# Patient Record
Sex: Male | Born: 1940 | Race: White | Hispanic: No | Marital: Married | Smoking: Former smoker
Health system: Southern US, Community
[De-identification: ages and names within clinical notes are randomized; demographics above are authoritative.]

## PROBLEM LIST (undated history)

## (undated) DIAGNOSIS — E785 Hyperlipidemia, unspecified: Secondary | ICD-10-CM

## (undated) DIAGNOSIS — C801 Malignant (primary) neoplasm, unspecified: Secondary | ICD-10-CM

## (undated) DIAGNOSIS — G4730 Sleep apnea, unspecified: Secondary | ICD-10-CM

## (undated) DIAGNOSIS — F329 Major depressive disorder, single episode, unspecified: Secondary | ICD-10-CM

## (undated) DIAGNOSIS — I10 Essential (primary) hypertension: Secondary | ICD-10-CM

## (undated) DIAGNOSIS — F419 Anxiety disorder, unspecified: Secondary | ICD-10-CM

## (undated) DIAGNOSIS — G629 Polyneuropathy, unspecified: Secondary | ICD-10-CM

## (undated) DIAGNOSIS — I4891 Unspecified atrial fibrillation: Secondary | ICD-10-CM

## (undated) DIAGNOSIS — I480 Paroxysmal atrial fibrillation: Secondary | ICD-10-CM

## (undated) DIAGNOSIS — K219 Gastro-esophageal reflux disease without esophagitis: Secondary | ICD-10-CM

## (undated) DIAGNOSIS — I499 Cardiac arrhythmia, unspecified: Secondary | ICD-10-CM

## (undated) DIAGNOSIS — E119 Type 2 diabetes mellitus without complications: Secondary | ICD-10-CM

## (undated) DIAGNOSIS — I639 Cerebral infarction, unspecified: Secondary | ICD-10-CM

## (undated) DIAGNOSIS — M1990 Unspecified osteoarthritis, unspecified site: Secondary | ICD-10-CM

## (undated) DIAGNOSIS — F32A Depression, unspecified: Secondary | ICD-10-CM

## (undated) DIAGNOSIS — I739 Peripheral vascular disease, unspecified: Secondary | ICD-10-CM

## (undated) DIAGNOSIS — E669 Obesity, unspecified: Secondary | ICD-10-CM

## (undated) HISTORY — DX: Major depressive disorder, single episode, unspecified: F32.9

## (undated) HISTORY — DX: Anxiety disorder, unspecified: F41.9

## (undated) HISTORY — DX: Malignant (primary) neoplasm, unspecified: C80.1

## (undated) HISTORY — DX: Depression, unspecified: F32.A

## (undated) HISTORY — DX: Essential (primary) hypertension: I10

## (undated) HISTORY — DX: Gastro-esophageal reflux disease without esophagitis: K21.9

## (undated) HISTORY — DX: Polyneuropathy, unspecified: G62.9

## (undated) HISTORY — DX: Paroxysmal atrial fibrillation: I48.0

## (undated) HISTORY — DX: Cerebral infarction, unspecified: I63.9

## (undated) HISTORY — DX: Hyperlipidemia, unspecified: E78.5

## (undated) HISTORY — DX: Unspecified atrial fibrillation: I48.91

## (undated) HISTORY — DX: Obesity, unspecified: E66.9

## (undated) HISTORY — DX: Type 2 diabetes mellitus without complications: E11.9

## (undated) HISTORY — PX: BACK SURGERY: SHX140

## (undated) HISTORY — PX: KNEE ARTHROSCOPY: SUR90

## (undated) HISTORY — PX: COLONOSCOPY: SHX174

## (undated) HISTORY — PX: BASAL CELL CARCINOMA EXCISION: SHX1214

---
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
## 1991-07-18 DIAGNOSIS — C4491 Basal cell carcinoma of skin, unspecified: Secondary | ICD-10-CM

## 1991-07-18 HISTORY — DX: Basal cell carcinoma of skin, unspecified: C44.91

## 1999-02-11 ENCOUNTER — Ambulatory Visit (HOSPITAL_COMMUNITY): Admission: RE | Admit: 1999-02-11 | Discharge: 1999-02-11 | Payer: Self-pay | Admitting: Internal Medicine

## 1999-02-11 ENCOUNTER — Encounter: Payer: Self-pay | Admitting: Internal Medicine

## 1999-03-01 ENCOUNTER — Encounter: Payer: Self-pay | Admitting: Neurosurgery

## 1999-03-01 ENCOUNTER — Ambulatory Visit (HOSPITAL_COMMUNITY): Admission: RE | Admit: 1999-03-01 | Discharge: 1999-03-02 | Payer: Self-pay | Admitting: Neurosurgery

## 1999-12-13 ENCOUNTER — Ambulatory Visit (HOSPITAL_COMMUNITY): Admission: RE | Admit: 1999-12-13 | Discharge: 1999-12-13 | Payer: Self-pay | Admitting: Internal Medicine

## 1999-12-13 ENCOUNTER — Encounter: Payer: Self-pay | Admitting: Internal Medicine

## 1999-12-22 ENCOUNTER — Encounter: Admission: RE | Admit: 1999-12-22 | Discharge: 2000-03-21 | Payer: Self-pay | Admitting: Anesthesiology

## 2004-03-08 ENCOUNTER — Encounter: Payer: Self-pay | Admitting: Internal Medicine

## 2008-08-20 DIAGNOSIS — C4492 Squamous cell carcinoma of skin, unspecified: Secondary | ICD-10-CM

## 2008-08-20 HISTORY — DX: Squamous cell carcinoma of skin, unspecified: C44.92

## 2009-02-06 ENCOUNTER — Encounter (INDEPENDENT_AMBULATORY_CARE_PROVIDER_SITE_OTHER): Payer: Self-pay | Admitting: *Deleted

## 2009-05-22 ENCOUNTER — Ambulatory Visit: Payer: Self-pay | Admitting: Internal Medicine

## 2009-05-22 DIAGNOSIS — Z794 Long term (current) use of insulin: Secondary | ICD-10-CM

## 2009-05-22 DIAGNOSIS — K219 Gastro-esophageal reflux disease without esophagitis: Secondary | ICD-10-CM | POA: Insufficient documentation

## 2009-05-22 DIAGNOSIS — E119 Type 2 diabetes mellitus without complications: Secondary | ICD-10-CM | POA: Insufficient documentation

## 2009-06-24 ENCOUNTER — Telehealth: Payer: Self-pay | Admitting: Internal Medicine

## 2009-06-24 ENCOUNTER — Encounter: Payer: Self-pay | Admitting: Internal Medicine

## 2009-06-24 ENCOUNTER — Ambulatory Visit: Payer: Self-pay | Admitting: Internal Medicine

## 2009-06-24 DIAGNOSIS — K2980 Duodenitis without bleeding: Secondary | ICD-10-CM | POA: Insufficient documentation

## 2009-06-24 LAB — CONVERTED CEMR LAB: UREASE: NEGATIVE

## 2009-06-26 ENCOUNTER — Encounter: Payer: Self-pay | Admitting: Internal Medicine

## 2009-08-12 ENCOUNTER — Telehealth (INDEPENDENT_AMBULATORY_CARE_PROVIDER_SITE_OTHER): Payer: Self-pay | Admitting: *Deleted

## 2009-08-13 ENCOUNTER — Encounter (INDEPENDENT_AMBULATORY_CARE_PROVIDER_SITE_OTHER): Payer: Self-pay

## 2009-08-14 ENCOUNTER — Ambulatory Visit: Payer: Self-pay | Admitting: Internal Medicine

## 2009-08-14 ENCOUNTER — Encounter (INDEPENDENT_AMBULATORY_CARE_PROVIDER_SITE_OTHER): Payer: Self-pay

## 2009-08-31 ENCOUNTER — Telehealth: Payer: Self-pay | Admitting: Internal Medicine

## 2009-08-31 ENCOUNTER — Ambulatory Visit: Payer: Self-pay | Admitting: Internal Medicine

## 2009-09-03 ENCOUNTER — Encounter: Payer: Self-pay | Admitting: Internal Medicine

## 2009-09-30 DIAGNOSIS — F320 Major depressive disorder, single episode, mild: Secondary | ICD-10-CM | POA: Insufficient documentation

## 2010-07-23 ENCOUNTER — Encounter: Admission: RE | Admit: 2010-07-23 | Discharge: 2010-07-23 | Payer: Self-pay | Admitting: Endocrinology

## 2010-10-14 NOTE — Procedures (Signed)
Summary: colonoscopy   Colonoscopy  Procedure date:  03/08/2004  Findings:      Results: Diverticulosis.       Location:  [facility].    Comments:      Repeat colonoscopy in 5 years.   Procedures Next Due Date:    Colonoscopy: 03/2009 Patient Name: Charles, Hall MRN:  Procedure Procedures: Colonoscopy CPT: 16109.  Personnel: Endoscopist: Wilhemina Bonito. Marina Goodell, MD.  Referred By: Adrian Prince, MD.  Exam Location: Exam performed in Outpatient Clinic. Outpatient  Patient Consent: Procedure, Alternatives, Risks and Benefits discussed, consent obtained, from patient. Consent was obtained by the RN.  Indications  Average Risk Screening Routine.  History  Current Medications: Patient is not currently taking Coumadin.  Pre-Exam Physical: Performed Mar 08, 2004. Entire physical exam was normal.  Exam Exam: Extent of exam reached: Cecum, extent intended: Cecum.  The cecum was identified by appendiceal orifice and IC valve. Patient position: on left side. Colon retroflexion performed. Images taken. ASA Classification: II. Tolerance: excellent.  Monitoring: Pulse and BP monitoring, Oximetry used. Supplemental O2 given.  Colon Prep Used Miralax for colon prep. Prep results: excellent.  Sedation Meds: Patient assessed and found to be appropriate for moderate (conscious) sedation. Fentanyl 100 mcg. given IV. Versed 10 mg. given IV.  Findings NORMAL EXAM: Cecum to Rectum.  - DIVERTICULOSIS: Sigmoid Colon. ICD9: Diverticulosis, Colon: 562.10.    Comments: NO POLYPS SEEN Assessment Abnormal examination, see findings above.  Diagnoses: 562.10: Diverticulosis, Colon.   Events  Unplanned Interventions: No intervention was required.  Unplanned Events: There were no complications. Plans Disposition: After procedure patient sent to recovery. After recovery patient sent home.  Scheduling/Referral: Colonoscopy, to Wilhemina Bonito. Marina Goodell, MD, IN ABOUT 5 YEARS FOR  REPEAT SCREENING,    This report was created from the original endoscopy report, which was reviewed and signed by the above listed endoscopist.   cc:  Adrian Prince, MD      The patient

## 2010-10-14 NOTE — Miscellaneous (Signed)
Summary: Lec previsit  Clinical Lists Changes  Observations: Added new observation of ALLERGY REV: Done (08/14/2009 15:29)

## 2010-12-13 LAB — GLUCOSE, CAPILLARY: Glucose-Capillary: 132 mg/dL — ABNORMAL HIGH (ref 70–99)

## 2010-12-16 LAB — GLUCOSE, CAPILLARY
Glucose-Capillary: 152 mg/dL — ABNORMAL HIGH (ref 70–99)
Glucose-Capillary: 168 mg/dL — ABNORMAL HIGH (ref 70–99)

## 2011-01-28 NOTE — Op Note (Signed)
[facility]  Patient:    [name], [name]                       MRN: [id number] Proc. Date: 01/24/00 Adm. Date:  [id number] Attending:  Thyra Breed CC:         Winn Jock. Earl Gala, M.D.             Stefani Dama, M.D.                           Operative Report  PROCEDURE:  Scheduled third epidural steroid injection.  HISTORY:  The patient got about ten days worth of benefits from his last injection and now complains of left hip and left foot pain.  He does not feel as though he is getting any sustained improvements, and at his second injection, noted no overall improvement.  I advised him that his MRI does show rather significant degenerative disk disease and herniation to the left L4-5 which I suspect is the source of his discomfort.  PHYSICAL EXAMINATION:  VITAL SIGNS:  Blood pressure is 133/77; heart rate 74; respiratory rate 14; O2 saturation 98%; pain level is 7 out of 10; temperature is 97.3.  The patient demonstrates negative straight-leg raise signs.  His deep tendon reflexes were hypoactive and symmetric in the lower extremity.  IMPRESSION: 1. Low back pain, on the basis of herniated disk at L4-5 to the left, with    underlying degenerative disk disease and ______ arthritis, not    responsive to two epidural steroid injections. 2. Other medical problems per Dr. Earl Gala.  DISPOSITION: 1. The patient is scheduled to see Dr. Danielle Dess in two weeks, and I advised him    I would hold off any injections today. DD:  01/24/00 TD:  01/25/00 Job: [id number]

## 2011-04-12 ENCOUNTER — Emergency Department (HOSPITAL_COMMUNITY): Payer: Medicare Other

## 2011-04-12 ENCOUNTER — Emergency Department (HOSPITAL_COMMUNITY)
Admission: EM | Admit: 2011-04-12 | Discharge: 2011-04-12 | Disposition: A | Discharge status: Home / Self Care | Payer: Medicare Other | Attending: Emergency Medicine | Admitting: Emergency Medicine

## 2011-04-12 DIAGNOSIS — Z79899 Other long term (current) drug therapy: Secondary | ICD-10-CM | POA: Insufficient documentation

## 2011-04-12 DIAGNOSIS — Z9889 Other specified postprocedural states: Secondary | ICD-10-CM | POA: Insufficient documentation

## 2011-04-12 DIAGNOSIS — Z794 Long term (current) use of insulin: Secondary | ICD-10-CM | POA: Insufficient documentation

## 2011-04-12 DIAGNOSIS — E119 Type 2 diabetes mellitus without complications: Secondary | ICD-10-CM | POA: Insufficient documentation

## 2011-04-12 DIAGNOSIS — I10 Essential (primary) hypertension: Secondary | ICD-10-CM | POA: Insufficient documentation

## 2011-04-12 DIAGNOSIS — M79609 Pain in unspecified limb: Secondary | ICD-10-CM | POA: Insufficient documentation

## 2011-04-12 DIAGNOSIS — E7800 Pure hypercholesterolemia, unspecified: Secondary | ICD-10-CM | POA: Insufficient documentation

## 2011-04-12 DIAGNOSIS — M549 Dorsalgia, unspecified: Secondary | ICD-10-CM | POA: Insufficient documentation

## 2011-04-12 DIAGNOSIS — M5430 Sciatica, unspecified side: Secondary | ICD-10-CM | POA: Insufficient documentation

## 2011-05-25 ENCOUNTER — Encounter (HOSPITAL_COMMUNITY)
Admission: RE | Admit: 2011-05-25 | Discharge: 2011-05-25 | Disposition: A | Discharge status: Home / Self Care | Payer: Medicare Other | Source: Ambulatory Visit | Attending: Neurological Surgery | Admitting: Neurological Surgery

## 2011-05-25 ENCOUNTER — Other Ambulatory Visit (HOSPITAL_COMMUNITY): Payer: Self-pay | Admitting: Neurological Surgery

## 2011-05-25 DIAGNOSIS — M5416 Radiculopathy, lumbar region: Secondary | ICD-10-CM

## 2011-05-25 LAB — BASIC METABOLIC PANEL
BUN: 18 mg/dL (ref 6–23)
Chloride: 102 mEq/L (ref 96–112)
Glucose, Bld: 370 mg/dL — ABNORMAL HIGH (ref 70–99)
Potassium: 5.4 mEq/L — ABNORMAL HIGH (ref 3.5–5.1)

## 2011-05-25 LAB — CBC
HCT: 40.5 % (ref 39.0–52.0)
Hemoglobin: 13.9 g/dL (ref 13.0–17.0)
MCHC: 34.3 g/dL (ref 30.0–36.0)
WBC: 9.2 10*3/uL (ref 4.0–10.5)

## 2011-05-30 ENCOUNTER — Ambulatory Visit (HOSPITAL_COMMUNITY)
Admission: RE | Admit: 2011-05-30 | Discharge: 2011-05-30 | Disposition: A | Discharge status: Home / Self Care | Payer: Medicare Other | Source: Ambulatory Visit | Attending: Neurological Surgery | Admitting: Neurological Surgery

## 2011-05-30 ENCOUNTER — Ambulatory Visit (HOSPITAL_COMMUNITY): Payer: Medicare Other

## 2011-05-30 DIAGNOSIS — M5126 Other intervertebral disc displacement, lumbar region: Secondary | ICD-10-CM | POA: Insufficient documentation

## 2011-05-30 DIAGNOSIS — Z01818 Encounter for other preprocedural examination: Secondary | ICD-10-CM | POA: Insufficient documentation

## 2011-05-30 DIAGNOSIS — Z01812 Encounter for preprocedural laboratory examination: Secondary | ICD-10-CM | POA: Insufficient documentation

## 2011-05-30 DIAGNOSIS — I10 Essential (primary) hypertension: Secondary | ICD-10-CM | POA: Insufficient documentation

## 2011-05-30 DIAGNOSIS — K219 Gastro-esophageal reflux disease without esophagitis: Secondary | ICD-10-CM | POA: Insufficient documentation

## 2011-05-30 DIAGNOSIS — E119 Type 2 diabetes mellitus without complications: Secondary | ICD-10-CM | POA: Insufficient documentation

## 2011-05-30 DIAGNOSIS — Z01810 Encounter for preprocedural cardiovascular examination: Secondary | ICD-10-CM | POA: Insufficient documentation

## 2011-05-30 DIAGNOSIS — M47817 Spondylosis without myelopathy or radiculopathy, lumbosacral region: Secondary | ICD-10-CM | POA: Insufficient documentation

## 2011-05-30 LAB — GLUCOSE, CAPILLARY
Glucose-Capillary: 207 mg/dL — ABNORMAL HIGH (ref 70–99)
Glucose-Capillary: 215 mg/dL — ABNORMAL HIGH (ref 70–99)
Glucose-Capillary: 277 mg/dL — ABNORMAL HIGH (ref 70–99)

## 2011-07-13 NOTE — Op Note (Signed)
[name], [name]                ACCOUNT NO.:  [id number]  MEDICAL RECORD NO.:  [id number]  LOCATION:  3537                         FACILITY:  [facility]  PHYSICIAN:  Stefani Dama, M.D.  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  OPERATION:  Microdiskectomy at L4-L5 right with operating microscope in microdissection technique.  SURGEON:  Stefani Dama, MD  FIRST ASSISTANT:  Cristi Loron, MD  ANESTHESIA:  General endotracheal.  INDICATIONS:  [name] is a 70 year old individual who has had significant back and right lower extremity pain, this has been on and off over the last number of years, but in the past 2 months, has been severely worse causing him to come to the emergency room on a couple of occasions for medical treatment.  He has been advised regarding the need for surgical decompression.  He has underlying diabetes.  PROCEDURE:  The patient was brought to the operating room supine on the stretcher.  After the smooth induction of general endotracheal anesthesia, he was turned prone, the back was prepped with alcohol and DuraPrep and draped in a sterile fashion.  He had a previous laminectomy a number of years ago and midportion of this incision was reopened. Dissection was carried down to the lumbodorsal fascia which was opened on the right side of midline.  Subperiosteal dissection was then performed in the interlaminar space after localizing radiograph identified the interlaminar space at L4-L5.  The dissection was then carried out to the region of the facet and the soft tissues off the inferior margin of the  lamina of L4 were cleared.  High-speed drill was then used to remove the inferior portion of the lamina of L4 out to the medial wall facet.  Partial medial facetectomy was performed.  The ligament was taken up in this region and the common dural tube was then exposed.  In the lateral aspect of the dural tube, it was noted be the takeoff of the L5 nerve root, which was bowed dorsally and had some significant adhesions and scar tissue to the lateral edges.  This corresponded to an area of stenosis noted just below the region of the disk space.  Further dissection freed the common dural tube and the nerve root could be mobilized medially in the cephalad portion and there was noted to be a disk herniation with some free pieces of disk material.  There was, however, also a large bony osteophytosis from the inferior margin body of L4 and dissection around this area ultimately yielded a bone spur that was removed with an osteophyte removal tool. Further dissection, we removed some further osteophytic material and there was noted to be an opening into the disk space from which the disk material was protruding.  This was then further explored and enlarged and the disk space was entered with a vast amount of significantly degenerated disk material freely  coming from the disk space itself. Series of Kerrison rongeurs was then used to evacuate the disk space both medially and laterally as best possible.  During this portion of procedure that particularly Dr. Lovell Sheehan helped to providing dural retraction and also working on lateral aspect of the disk space to do the decompression laterally.  After number of passes both medially and laterally and removing a substantial amount of significantly degenerated disk material, the disk space was emptied, it was irrigated number of times, no other fragments of disk were identified.  Probing both medially and laterally with long probes yielded no additional  material. At this point, we explored the common dural tube and the nerve root was noted to be well decompressed.  Hemostasis in the soft tissues was obtained meticulously.  Retractor was removed.  Lumbodorsal fascia was closed with #1 Vicryl interrupted fashion, 2-0 Vicryl was using subcutaneous tissues.  An 18 mL of 0.5% Marcaine was injected into the paraspinous fascia and musculature and final closure was with 3-0 Vicryl interrupted fashion subcuticularly.  Dermabond was placed on the skin. Blood loss for the procedure was estimated 100 mL.     Stefani Dama, M.D.     Merla Riches  D:  05/30/2011  T:  05/30/2011  Job:  [id number]  Electronically Signed by Barnett Abu M.D. on 07/13/2011 06:53:27 AM

## 2011-07-28 ENCOUNTER — Other Ambulatory Visit: Payer: Self-pay | Admitting: Dermatology

## 2011-09-26 ENCOUNTER — Other Ambulatory Visit: Payer: Self-pay | Admitting: Dermatology

## 2012-07-30 ENCOUNTER — Ambulatory Visit: Payer: Medicare Other | Admitting: *Deleted

## 2012-08-02 ENCOUNTER — Encounter: Payer: Medicare Other | Attending: Endocrinology | Admitting: *Deleted

## 2012-08-02 ENCOUNTER — Encounter: Payer: Self-pay | Admitting: *Deleted

## 2012-08-02 VITALS — Ht 72.0 in | Wt 283.2 lb

## 2012-08-02 DIAGNOSIS — E119 Type 2 diabetes mellitus without complications: Secondary | ICD-10-CM

## 2012-08-02 DIAGNOSIS — Z713 Dietary counseling and surveillance: Secondary | ICD-10-CM | POA: Insufficient documentation

## 2012-08-02 NOTE — Patient Instructions (Addendum)
Plan: Aim for 5 carb choices per meal +/- 1 either way Aim for 2 carb choices per snack if hungry Read food labels for Total Carbohydrate of foods Continue checking BG as directed by MD and consider checking after meals 3 times a week

## 2012-08-02 NOTE — Progress Notes (Signed)
  Medical Nutrition Therapy:  Appt start time: 1700 end time:  1800.  Assessment:  Primary concerns today: patient states history of diabetes since he turned 71 years old. He has had some diabetes education over the years, but would like to fine tune his control. 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 He takes pre-mixed insulin BID. He states his wife also has diabetes so between them they believe they know what to do but frequently don't really pay attention. States he has neuropathy in his feet which affects his balance so needs to be very careful about not falling. He states he tests his BG every AM and occasionally in the evening. His current FBG are running just under 200 mg/dl per patient.  MEDICATIONS: see list. Diabetes medication is Novolog 70/30 currently taking 54 units BID   DIETARY INTAKE:  Usual eating pattern includes 3 meals and 2-3 snacks per day.  Everyday foods include good variety of all food groups.  Avoided foods include none stated.    24-hr recall:  B ( AM): large bowl of cereal with fresh fruit on top, skim milk, water to drink  Snk ( AM): not usually  L ( PM): fast food: sandwich, occasionally fries, diet soda Snk ( PM): not usually, fresh fruit OR muffin OR cookie D ( PM): wife prepares most meals or order out chinese or chicken pot pie. Snk ( PM): nuts and apples OR mixed nuts Beverages: water, diet soda  Usual physical activity:none due to problems with bad knee & neuropathy in both feet so poor balance  Estimated energy needs: 1600 calories 180 g carbohydrates 120 g protein 44 g fat  Progress Towards Goal(s):  In progress.   Nutritional Diagnosis:  NB-1.1 Food and nutrition-related knowledge deficit As related to diabetes management.  As evidenced by A1c of 8.6%.    Intervention:  Nutrition counseling and diabetes education initiated. Due to his past basic diabetes education, I concentrated on  Carb Counting, reading  food labels and rationale of checking BG at alternate times of day today. Upon further discussion with him, I feel he would benefit from a more thorough review of basic physiology of diabetes,  A1c, action of his current insulin and benefits of increased activity appropriate for his limited physical abilities.  Plan: Aim for 5 carb choices per meal +/- 1 either way Aim for 2 carb choices per snack if hungry Read food labels for Total Carbohydrate of foods Continue checking BG as directed by MD and consider checking after meals 3 times a week  Handouts given during visit include: Living Well with Diabetes Carb Counting and Food Label handouts Meal Plan Card  Monitoring/Evaluation:  Dietary intake, exercise, reading food labels, and body weight in 3 week(s).

## 2012-08-03 ENCOUNTER — Encounter: Payer: Self-pay | Admitting: *Deleted

## 2012-08-20 ENCOUNTER — Encounter: Payer: Self-pay | Admitting: *Deleted

## 2012-08-20 ENCOUNTER — Encounter: Payer: Medicare Other | Attending: Endocrinology | Admitting: *Deleted

## 2012-08-20 VITALS — Ht 72.0 in | Wt 283.7 lb

## 2012-08-20 DIAGNOSIS — E119 Type 2 diabetes mellitus without complications: Secondary | ICD-10-CM

## 2012-08-20 DIAGNOSIS — Z713 Dietary counseling and surveillance: Secondary | ICD-10-CM | POA: Insufficient documentation

## 2012-08-20 NOTE — Patient Instructions (Addendum)
Plan: Continue to aim for 5 carb choices per meal +/- 1 either way Continue to aim for 2 carb choices per snack if hungry Continue to read food labels for Total Carbohydrate of foods Continue checking BG as directed by MD and consider checking @ 2 hours after meals 2 times a week (Monday and Friday) Plan to record food intake and Carb choices only on Mondays until our next visit

## 2012-08-20 NOTE — Progress Notes (Signed)
  Medical Nutrition Therapy:  Appt start time: 0800 end time:  0900.  Assessment:  Primary concerns today: follow up visit for diabetes education and Carb Counting. He is concerned over how much he has eaten since our last visit in mid November and he has not recorded his food as often as he had hoped he would. He did bring food and BG records which indicated an improvement of FBG's from 150 - 200 mg/dl down to an average under 150 mg/dl which is an improvement of about 50 mg/dl after he learned Carb Counting.  MEDICATIONS: see list. Diabetes medication is Novolog 70/30 currently taking 54 units BID   DIETARY INTAKE:  Usual eating pattern includes 3 meals and 2-3 snacks per day.  Everyday foods include good variety of all food groups.  Avoided foods include none stated.    24-hr recall:  B ( AM): large bowl of cereal with fresh fruit on top, skim milk, water to drink  Snk ( AM): not usually  L ( PM): fast food: sandwich, occasionally fries, diet soda Snk ( PM): not usually, fresh fruit OR muffin OR cookie D ( PM): wife prepares most meals or order out chinese or chicken pot pie. Snk ( PM): nuts and apples OR mixed nuts Beverages: water, diet soda  Usual physical activity:none due to problems with bad knee & neuropathy in both feet so poor balance  Estimated energy needs: 1600 calories 180 g carbohydrates 120 g protein 44 g fat  Progress Towards Goal(s):  In progress.   Nutritional Diagnosis:  NB-1.1 Food and nutrition-related knowledge deficit As related to diabetes management.  As evidenced by A1c of 8.6%.    Intervention:  Reviewed his food records and reinforced his understanding of Carb Counting using his food records that he brought. Extended education to review of basic physiology of diabetes,  A1c, action of his current 7-0/30 insulin. Empasized factors that affect his BG and encouraged him to test after meals occasionally to provide more data to make management decisions  with. He agreed to continue SMBG fasting daily and to add a 2 hour post meal SMBG 2 days a week. He requests ongoing support to continue managing his DM as effectively as possible. Plan to discuss increasing his activity level at next visit.  Plan: Continue to aim for 5 carb choices per meal +/- 1 either way Continue to aim for 2 carb choices per snack if hungry Continue to read food labels for Total Carbohydrate of foods Continue checking BG as directed by MD and consider checking @ 2 hours after meals 2 times a week (Monday and Friday) Plan to record food intake and Carb choices only on Mondays until our next visit   Handouts given during visit include: Living Well with Diabetes Meal Planning Sheet Insulin Action information  Monitoring/Evaluation:  Dietary intake, exercise, reading food labels, and body weight in 6 week(s).

## 2012-10-01 ENCOUNTER — Ambulatory Visit: Payer: Medicare Other | Admitting: *Deleted

## 2013-02-11 ENCOUNTER — Other Ambulatory Visit: Payer: Self-pay | Admitting: Dermatology

## 2014-03-03 ENCOUNTER — Telehealth: Payer: Self-pay | Admitting: Interventional Cardiology

## 2014-03-03 NOTE — Telephone Encounter (Signed)
New message     Dr Baldwin [facility] office is calling to check the status on an appt for this pt to see Dr Tamala Julian

## 2014-03-03 NOTE — Telephone Encounter (Signed)
pt aware appt sch with Dr.Smith for 6/23 @9 :45am. Kim @ [facility] aware of pt appt.

## 2014-03-04 ENCOUNTER — Other Ambulatory Visit: Payer: Self-pay

## 2014-03-04 ENCOUNTER — Encounter: Payer: Self-pay | Admitting: Interventional Cardiology

## 2014-03-04 ENCOUNTER — Ambulatory Visit (INDEPENDENT_AMBULATORY_CARE_PROVIDER_SITE_OTHER): Payer: Medicare HMO | Admitting: Interventional Cardiology

## 2014-03-04 VITALS — BP 140/70 | HR 101 | Ht 72.0 in | Wt 278.0 lb

## 2014-03-04 DIAGNOSIS — G4733 Obstructive sleep apnea (adult) (pediatric): Secondary | ICD-10-CM | POA: Insufficient documentation

## 2014-03-04 DIAGNOSIS — I4891 Unspecified atrial fibrillation: Secondary | ICD-10-CM

## 2014-03-04 DIAGNOSIS — R0683 Snoring: Secondary | ICD-10-CM

## 2014-03-04 DIAGNOSIS — R0989 Other specified symptoms and signs involving the circulatory and respiratory systems: Secondary | ICD-10-CM

## 2014-03-04 DIAGNOSIS — R0609 Other forms of dyspnea: Secondary | ICD-10-CM

## 2014-03-04 MED ORDER — RIVAROXABAN 20 MG PO TABS: 20.0000 mg | ORAL_TABLET | Freq: Every day | ORAL | Status: DC

## 2014-03-04 NOTE — Patient Instructions (Signed)
Your physician has recommended you make the following change in your medication:  1) START Xarelto 20mg  daily  Your physician has recommended that you wear a holter monitor. Holter monitors are medical devices that record the heart's electrical activity. Doctors most often use these monitors to diagnose arrhythmias. Arrhythmias are problems with the speed or rhythm of the heartbeat. The monitor is a small, portable device. You can wear one while you do your normal daily activities. This is usually used to diagnose what is causing palpitations/syncope (passing out).   Your physician has requested that you have an echocardiogram. Echocardiography is a painless test that uses sound waves to create images of your heart. It provides your doctor with information about the size and shape of your heart and how well your heart's chambers and valves are working. This procedure takes approximately one hour. There are no restrictions for this procedure.   Your physician has recommended that you have a sleep study. This test records several body functions during sleep, including: brain activity, eye movement, oxygen and carbon dioxide blood levels, heart rate and rhythm, breathing rate and rhythm, the flow of air through your mouth and nose, snoring, body muscle movements, and chest and belly movement.  Your physician recommends that you schedule a follow-up appointment in: 3-4 weeks

## 2014-03-04 NOTE — Progress Notes (Signed)
[name], male   DOB: 1940-10-06, 73 y.o.   MRN: [id number]   Date: 03/04/2014 ID: [name], DOB [birth year], MRN [id number] PCP: Sheela Stack, MD  Reason: Atrial fibrillation  ASSESSMENT;  1. atrial fibrillation, unknown duration 2. Hypertension 3. Diabetes mellitus 4.Peripheral Neuropathy  PLAN:  1. 48 hour Holter monitor to establish average heart rate. This will in turn help establish treatment strategy, rhythm control versus rate control her 2. 2-D Doppler echocardiogram 3. Continue anticoagulation but change Xarelto to 20 mg daily 4. Sleep study 5. Followup appointment in 3-4 week  SUBJECTIVE: [name] is a 73 y.o. male who was identified to have atrial fibrillation during a Life Screen monitoring. The EKG revealed an abnormality. He has no particular complaints. He denies orthopnea, PND, dyspnea, excessive palpitations, edema, syncope, and chest pain. He's had no neurological complaints.   No Known Allergies  Current Outpatient Prescriptions on File Prior to Visit  Medication Sig Dispense Refill  . benazepril (LOTENSIN) 40 MG tablet Take 40 mg by mouth daily.      . ergocalciferol (VITAMIN D2) 50000 UNITS capsule Take 50,000 Units by mouth once a week.      . furosemide (LASIX) 20 MG tablet Take 20 mg by mouth 2 (two) times daily.      . insulin aspart protamine-insulin aspart (NOVOLOG 70/30) (70-30) 100 UNIT/ML injection Inject 54 Units into the skin 2 (two) times daily with a meal.      . omeprazole (PRILOSEC) 10 MG capsule Take 10 mg by mouth daily.      [name] Kitchen venlafaxine XR (EFFEXOR-XR) 75 MG 24 hr capsule Take 75 mg by mouth daily.      [name] Kitchen aspirin 81 MG tablet Take 81 mg by mouth daily.       No current facility-administered medications on file prior to visit.    Past Medical History  Diagnosis Date  . Diabetes mellitus without complication   . Hypertension   . Hyperlipidemia   . Obesity   . GERD (gastroesophageal reflux disease)      Past Surgical History  Procedure Laterality Date  . Back surgery    . Colon surgery      History   Social History  . Marital Status: Married    Spouse Name: N/A    Number of Children: N/A  . Years of Education: N/A   Occupational History  . Not on file.   Social History Main Topics  . Smoking status: Former Smoker    Types: Cigars    Quit date: 08/02/1992  . Smokeless tobacco: Never Used  . Alcohol Use: Yes     Comment: 1 beer every 3 months  . Drug Use: Not on file  . Sexual Activity: Not on file   Other Topics Concern  . Not on file   Social History Narrative  . No narrative on file    No family history on file.  ROS: Denies edema. Snores loudly according to his wife. Diabetic without history of thyroid disease.. Other systems negative for complaints.  OBJECTIVE: BP 140/70  Pulse 101  Ht 6' (1.829 m)  Wt 278 lb (126.1 kg)  BMI 37.70 kg/m2, General: No acute distress, obese HEENT: normal without jaundice or pallor Neck: JVD flat. Carotids 2+ Chest: Clear Cardiac: Murmur: None. Gallop: None. Rhythm: Irregularly irregular. Other: Normal Abdomen: Bruit: Absent. Pulsation: Absent Extremities: Edema: Absent. Pulses: 2+ and symmetric Neuro: Normal Psych: Normal  ECG: Atrial fibrillation with rapid ventricular response  on EKG done 02/28/14

## 2014-03-07 ENCOUNTER — Ambulatory Visit (HOSPITAL_COMMUNITY): Payer: Medicare HMO

## 2014-03-11 ENCOUNTER — Encounter: Payer: Self-pay | Admitting: *Deleted

## 2014-03-11 ENCOUNTER — Encounter (INDEPENDENT_AMBULATORY_CARE_PROVIDER_SITE_OTHER): Payer: Medicare HMO

## 2014-03-11 ENCOUNTER — Ambulatory Visit (HOSPITAL_COMMUNITY): Payer: Medicare HMO | Attending: Cardiology | Admitting: Radiology

## 2014-03-11 DIAGNOSIS — I4891 Unspecified atrial fibrillation: Secondary | ICD-10-CM

## 2014-03-11 NOTE — Progress Notes (Signed)
Echocardiogram performed.  

## 2014-03-11 NOTE — Progress Notes (Signed)
[name], male   DOB: 09/03/1941, 73 y.o.   MRN: [id number] E-Cardio 48 hour holter monitor applied to patient.

## 2014-03-24 ENCOUNTER — Telehealth: Payer: Self-pay

## 2014-03-24 NOTE — Telephone Encounter (Signed)
called to give pt holter results.lmtcb 

## 2014-03-24 NOTE — Telephone Encounter (Signed)
pt called back.pt given holter results. Tachy-Brady, Paroxysmal Afib with controlled  VR, may need med adj or pacemaker, Dr.Smith will discuss further at upcoming o/v.pt vervbalized understanding.

## 2014-03-28 ENCOUNTER — Other Ambulatory Visit: Payer: Self-pay | Admitting: General Surgery

## 2014-03-28 DIAGNOSIS — R0683 Snoring: Secondary | ICD-10-CM

## 2014-04-15 ENCOUNTER — Encounter: Payer: Self-pay | Admitting: Interventional Cardiology

## 2014-04-15 ENCOUNTER — Ambulatory Visit (INDEPENDENT_AMBULATORY_CARE_PROVIDER_SITE_OTHER): Payer: Medicare HMO | Admitting: Interventional Cardiology

## 2014-04-15 VITALS — BP 148/56 | HR 78 | Ht 72.0 in | Wt 279.0 lb

## 2014-04-15 DIAGNOSIS — I495 Sick sinus syndrome: Secondary | ICD-10-CM | POA: Insufficient documentation

## 2014-04-15 DIAGNOSIS — R0683 Snoring: Secondary | ICD-10-CM

## 2014-04-15 DIAGNOSIS — R0989 Other specified symptoms and signs involving the circulatory and respiratory systems: Secondary | ICD-10-CM

## 2014-04-15 DIAGNOSIS — R0609 Other forms of dyspnea: Secondary | ICD-10-CM

## 2014-04-15 DIAGNOSIS — I4891 Unspecified atrial fibrillation: Secondary | ICD-10-CM

## 2014-04-15 NOTE — Progress Notes (Signed)
[name], male   DOB: August 19, 1941, 73 y.o.   MRN: [id number]    [address], [place]  [address] Phone: [phone] Fax:  [phone]  Date:  04/15/2014   ID:  [name], DOB 1941-06-07, MRN [id number]  PCP:  Sheela Stack, MD   ASSESSMENT:  1. Paroxysmal atrial fibrillation with a 12-25% burden based on 48 hour Holter monitoring 2. Holter monitor demonstrates significant sinus bradycardia at times, predominantly while asleep. 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 Probable sleep apnea 4. Chronic anticoagulation, Xarelto  PLAN:  1. Continue anticoagulation 2. Sleep study 3. Discussed the impact that andantiarrhythmic therapy might have, and decided not to add antiarrhythmic therapy because this may require subsequent pacemaker therapy 4. Notify us if bleeding, dizziness, syncope, dyspnea, or fatigue   SUBJECTIVE: [name] is a 73 y.o. male has completed an echocardiogram and 48 hour Holter. Atrial fibrillation burden is less than 25%. He has bradycardia at other times, predominantly during sleep. LV size and function are normal by echo. He is asymptomatic. No bleeding on anticoagulation.   Wt Readings from Last 3 Encounters:  04/15/14 279 lb (126.554 kg)  03/04/14 278 lb (126.1 kg)  08/20/12 283 lb 11.2 oz (128.685 kg)     Past Medical History  Diagnosis Date  . Diabetes mellitus without complication   . Hypertension   . Hyperlipidemia   . Obesity   . GERD (gastroesophageal reflux disease)     Current Outpatient Prescriptions  Medication Sig Dispense Refill  . ALPRAZolam (XANAX) 0.5 MG tablet Take 0.5 mg by mouth at bedtime as needed for anxiety.      . benazepril (LOTENSIN) 40 MG tablet Take 40 mg by mouth daily.      . Canagliflozin (INVOKANA) 100 MG TABS Take 100 mg by mouth daily.      . ergocalciferol (VITAMIN D2) 50000 UNITS capsule Take 50,000 Units by mouth once a week.      .  furosemide (LASIX) 20 MG tablet Take 20 mg by mouth 2 (two) times daily.      . insulin aspart protamine-insulin aspart (NOVOLOG 70/30) (70-30) 100 UNIT/ML injection Inject 54 Units into the skin 2 (two) times daily with a meal.      . l-methylfolate-B6-B12 (METANX) 3-35-2 MG TABS Take 1 tablet by mouth daily.      . Multiple Vitamins-Minerals (ONE-A-DAY MENS VITACRAVES PO) Take 1 tablet by mouth daily.      [name] Kitchen omeprazole (PRILOSEC) 10 MG capsule Take 10 mg by mouth daily.      . rivaroxaban (XARELTO) 20 MG TABS tablet Take 1 tablet (20 mg total) by mouth daily with supper.  30 tablet  11  . simvastatin (ZOCOR) 40 MG tablet Take 40 mg by mouth daily.      [name] Kitchen venlafaxine XR (EFFEXOR-XR) 75 MG 24 hr capsule Take 75 mg by mouth daily.       No current facility-administered medications for this visit.    Allergies:   No Known Allergies  Social History:  The patient  reports that he quit smoking about 21 years ago. His smoking use included Cigars. He has never used smokeless tobacco. He reports that he drinks alcohol.   ROS:  Please see the history of present illness.      All other systems reviewed and negative.   OBJECTIVE: VS:  BP 148/56  Pulse 78  Ht 6' (1.829 m)  Wt 279  lb (126.554 kg)  BMI 37.83 kg/m2 Well nourished, well developed, in no acute distress, obese HEENT: normal Neck: JVD flat. Carotid bruit absent  Cardiac:  normal S1, S2; RRR; no murmur Lungs:  clear to auscultation bilaterally, no wheezing, rhonchi or rales Abd: soft, nontender, no hepatomegaly Ext: Edema absent. Pulses 2+ and symmetric Skin: warm and dry Neuro:  CNs 2-12 intact, no focal abnormalities noted  EKG:  Not repeated      Signed, Illene Labrador III, MD 04/15/2014 8:47 AM

## 2014-04-15 NOTE — Patient Instructions (Signed)
Your physician recommends that you continue on your current medications as directed. Please refer to the Current Medication list given to you today.Continue taking Xarelto as prescribed  You have an appointment scheduled on 06/20/14 @11am  with Dr.Smith

## 2014-05-01 ENCOUNTER — Encounter: Payer: Self-pay | Admitting: Internal Medicine

## 2014-05-21 ENCOUNTER — Ambulatory Visit (HOSPITAL_BASED_OUTPATIENT_CLINIC_OR_DEPARTMENT_OTHER): Payer: Medicare HMO | Attending: Pulmonary Disease | Admitting: Radiology

## 2014-05-21 VITALS — Ht 72.0 in | Wt 280.0 lb

## 2014-05-21 DIAGNOSIS — I4891 Unspecified atrial fibrillation: Secondary | ICD-10-CM | POA: Insufficient documentation

## 2014-05-21 DIAGNOSIS — I491 Atrial premature depolarization: Secondary | ICD-10-CM | POA: Insufficient documentation

## 2014-05-21 DIAGNOSIS — G4710 Hypersomnia, unspecified: Secondary | ICD-10-CM | POA: Diagnosis present

## 2014-05-21 DIAGNOSIS — G4769 Other sleep related movement disorders: Secondary | ICD-10-CM | POA: Insufficient documentation

## 2014-05-21 DIAGNOSIS — G4733 Obstructive sleep apnea (adult) (pediatric): Secondary | ICD-10-CM | POA: Insufficient documentation

## 2014-05-21 DIAGNOSIS — R0683 Snoring: Secondary | ICD-10-CM

## 2014-05-29 DIAGNOSIS — G4730 Sleep apnea, unspecified: Secondary | ICD-10-CM

## 2014-05-29 DIAGNOSIS — G4710 Hypersomnia, unspecified: Secondary | ICD-10-CM

## 2014-05-29 NOTE — Sleep Study (Signed)
   NAME: [name] DATE OF BIRTH:  06-22-1941 MEDICAL RECORD NUMBER [id number]  LOCATION: [facility]  PHYSICIAN: Kathee Delton  DATE OF STUDY: 05/21/2014  SLEEP STUDY TYPE: Nocturnal Polysomnogram               REFERRING PHYSICIAN: Belva Crome III, MD  INDICATION FOR STUDY: Hypersomnia with sleep apnea  EPWORTH SLEEPINESS SCORE:  1 HEIGHT: 6' (182.9 cm)  WEIGHT: 280 lb (127.007 kg)    Body mass index is 37.97 kg/(m^2).  NECK SIZE: 18 in.  MEDICATIONS: Reviewed in the sleep record  SLEEP ARCHITECTURE: The patient had a total sleep time of 317 minutes with no slow-wave sleep and only 21 minutes of REM. Sleep onset latency was normal at 21 minutes, and REM onset was prolonged at 309 minutes. Sleep efficiency was minimally reduced at 89%.  RESPIRATORY DATA: The patient was found to have 18 apneas and 114 obstructive hypopneas, giving him an AHI of 25 events per hour. The events were increased in the supine position, and there was moderate snoring noted throughout. He did not meet split-night criteria secondary to the majority of his events occurring well after 2 AM.  OXYGEN DATA: There was transient oxygen desaturation as low as 76% during the night.  CARDIAC DATA: The patient was noted to have intermittent episodes of atrial fibrillation as well as normal sinus rhythm, and there were PACs noted as well.  MOVEMENT/PARASOMNIA: The patient had 145 limb movements noted during the night with mild sleep disruption. There were no abnormal behaviors seen.  IMPRESSION/ RECOMMENDATION:    1) moderate obstructive sleep apnea, with an AHI of 25 events per hour and transient oxygen desaturation as low as 76%. Treatment for this degree of sleep apnea can include a trial of weight loss alone, upper airway surgery, dental appliance, and also CPAP. Clinical correlation is suggested.  2) intermittent atrial fibrillation with normal sinus rhythm, as well as PACs were  noted.  3) large numbers of periodic limb movements with mild sleep disruption. It is unclear whether this is related to the patient's sleep disordered breathing, or whether he may have a concomitant primary movement disorder of sleep. Clinical correlation is suggested once the patient's sleep disordered breathing is treated appropriately.     Kathee Delton Diplomate, American Board of Sleep Medicine  ELECTRONICALLY SIGNED ON:  05/29/2014, 12:46 PM [place] PH: [phone]   FX: [phone] [place]

## 2014-06-20 ENCOUNTER — Encounter: Payer: Self-pay | Admitting: Interventional Cardiology

## 2014-06-20 ENCOUNTER — Ambulatory Visit (INDEPENDENT_AMBULATORY_CARE_PROVIDER_SITE_OTHER): Payer: Medicare HMO | Admitting: Interventional Cardiology

## 2014-06-20 VITALS — BP 140/74 | HR 70 | Ht 72.0 in | Wt 281.4 lb

## 2014-06-20 DIAGNOSIS — I4891 Unspecified atrial fibrillation: Secondary | ICD-10-CM

## 2014-06-20 DIAGNOSIS — G4733 Obstructive sleep apnea (adult) (pediatric): Secondary | ICD-10-CM

## 2014-06-20 DIAGNOSIS — I495 Sick sinus syndrome: Secondary | ICD-10-CM

## 2014-06-20 DIAGNOSIS — Z7901 Long term (current) use of anticoagulants: Secondary | ICD-10-CM | POA: Insufficient documentation

## 2014-06-20 MED ORDER — RIVAROXABAN 20 MG PO TABS: 20.0000 mg | ORAL_TABLET | Freq: Every day | ORAL | Status: DC

## 2014-06-20 NOTE — Patient Instructions (Signed)
Your physician recommends that you continue on your current medications as directed. Please refer to the Current Medication list given to you today.  Your physician wants you to follow-up in: 6 month ov You will receive a reminder letter in the mail two months in advance. If you don't receive a letter, please call our office to schedule the follow-up appointment.  

## 2014-06-20 NOTE — Progress Notes (Signed)
[name], male   DOB: Oct 29, 1940, 73 y.o.   MRN: [id number]    [address], [address] [place], [place]  [address] Phone: [phone] Fax:  [phone]  Date:  06/20/2014   ID:  [name], DOB 1941/02/18, MRN [id number]  PCP:  Sheela Stack, MD   ASSESSMENT:  1. Tachybradycardia syndrome with PAF, asymptomatic 2. Moderate sleep apnea recently diagnosed 3. Obesity 4. Hypertension, essential  PLAN:  1. [place] and treat sleep apnea via Dr. Golden Hurter 2. Continue anticoagulation therapy. I decided against antiarrhythmic therapy to suppress atrial fibrillation since he is asymptomatic and bradycardia will likely worsen. This would then lead to pacemaker therapy and I don't think that we should push.heart currently. 3. Weight loss 4. Aerobic exercise   SUBJECTIVE: [name] is a 73 y.o. male who is asymptomatic. Sleep study. He has not heard about results. We discussed the findings. He had multiple questions are answered. No syncope or near-syncope.   Wt Readings from Last 3 Encounters:  06/20/14 281 lb 6.4 oz (127.642 kg)  05/21/14 280 lb (127.007 kg)  04/15/14 279 lb (126.554 kg)     Past Medical History  Diagnosis Date  . Diabetes mellitus without complication   . Hypertension   . Hyperlipidemia   . Obesity   . GERD (gastroesophageal reflux disease)     Current Outpatient Prescriptions  Medication Sig Dispense Refill  . ALPRAZolam (XANAX) 0.5 MG tablet Take 0.5 mg by mouth at bedtime as needed for anxiety.      . benazepril (LOTENSIN) 40 MG tablet Take 40 mg by mouth daily.      . Canagliflozin (INVOKANA) 100 MG TABS Take 100 mg by mouth daily.      . ergocalciferol (VITAMIN D2) 50000 UNITS capsule Take 50,000 Units by mouth once a week.      . furosemide (LASIX) 20 MG tablet Take 20 mg by mouth 2 (two) times daily.      . insulin aspart protamine-insulin aspart (NOVOLOG 70/30) (70-30) 100 UNIT/ML injection Inject 54 Units into  the skin 2 (two) times daily with a meal.      . l-methylfolate-B6-B12 (METANX) 3-35-2 MG TABS Take 1 tablet by mouth daily.      . Multiple Vitamins-Minerals (ONE-A-DAY MENS VITACRAVES PO) Take 1 tablet by mouth daily.      [name] Kitchen omeprazole (PRILOSEC) 10 MG capsule Take 10 mg by mouth daily.      . rivaroxaban (XARELTO) 20 MG TABS tablet Take 1 tablet (20 mg total) by mouth daily with supper.  30 tablet  11  . simvastatin (ZOCOR) 40 MG tablet Take 40 mg by mouth daily.      [name] Kitchen venlafaxine XR (EFFEXOR-XR) 75 MG 24 hr capsule Take 75 mg by mouth daily.       No current facility-administered medications for this visit.    Allergies:   No Known Allergies  Social History:  The patient  reports that he quit smoking about 21 years ago. His smoking use included Cigars. He has never used smokeless tobacco. He reports that he drinks alcohol.   ROS:  Please see the history of present illness.   No blood in his urine or stool   All other systems reviewed and negative.   OBJECTIVE: VS:  BP 140/74  Pulse 70  Ht 6' (1.829 m)  Wt 281 lb 6.4 oz (127.642 kg)  BMI 38.16 kg/m2 Well nourished, well developed, in no acute distress, obese  HEENT: normal Neck: JVD flat. Carotid bruit absent  Cardiac:  normal S1, S2; RRR; no murmur Lungs:  clear to auscultation bilaterally, no wheezing, rhonchi or rales Abd: soft, nontender, no hepatomegaly Ext: Edema none. Pulses 2+ and symmetric Skin: warm and dry Neuro:  CNs 2-12 intact, no focal abnormalities noted  EKG:  Not repeated       Signed, Illene Labrador III, MD 06/20/2014 11:29 AM

## 2014-07-15 ENCOUNTER — Encounter: Payer: Self-pay | Admitting: Cardiology

## 2014-07-15 ENCOUNTER — Ambulatory Visit (INDEPENDENT_AMBULATORY_CARE_PROVIDER_SITE_OTHER): Payer: Medicare HMO | Admitting: Cardiology

## 2014-07-15 VITALS — BP 140/82 | HR 41 | Ht 72.0 in | Wt 282.2 lb

## 2014-07-15 DIAGNOSIS — I10 Essential (primary) hypertension: Secondary | ICD-10-CM

## 2014-07-15 DIAGNOSIS — G4733 Obstructive sleep apnea (adult) (pediatric): Secondary | ICD-10-CM

## 2014-07-15 DIAGNOSIS — E669 Obesity, unspecified: Secondary | ICD-10-CM

## 2014-07-15 DIAGNOSIS — I4891 Unspecified atrial fibrillation: Secondary | ICD-10-CM

## 2014-07-15 NOTE — Patient Instructions (Addendum)
You have been referred to Dr. Erik Obey. We will follow up with you after your appointment.

## 2014-07-15 NOTE — Progress Notes (Signed)
[address], [place], [place]  [address] Phone: [phone] Fax:  [phone]  Date:  07/15/2014   ID:  [name], DOB Jul 27, 1941, MRN [id number]  PCP:  Sheela Stack, MD  Cardiologist:  Dr, Daneen Schick    History of Present Illness: [name] is a 73 y.o. male with a history of PAF who was referred for sleep study.  He was found to have moderate to severe OSA with AHI 25/hr with O2 desats to 76%.  He has significant snoring at home and this was noted on the sleep study as well.  He was noted to have PAF during the sleep study as well.   Wt Readings from Last 3 Encounters:  07/15/14 282 lb 3.2 oz (128.005 kg)  06/20/14 281 lb 6.4 oz (127.642 kg)  05/21/14 280 lb (127.007 kg)     Past Medical History  Diagnosis Date  . Diabetes mellitus without complication   . Hypertension   . Hyperlipidemia   . Obesity   . GERD (gastroesophageal reflux disease)     Current Outpatient Prescriptions  Medication Sig Dispense Refill  . ALPRAZolam (XANAX) 0.5 MG tablet Take 0.5 mg by mouth at bedtime as needed for anxiety.    . benazepril (LOTENSIN) 40 MG tablet Take 40 mg by mouth daily.    . Canagliflozin (INVOKANA) 100 MG TABS Take 100 mg by mouth daily.    . ergocalciferol (VITAMIN D2) 50000 UNITS capsule Take 50,000 Units by mouth once a week.    . furosemide (LASIX) 20 MG tablet Take 20 mg by mouth 2 (two) times daily.    . insulin aspart protamine-insulin aspart (NOVOLOG 70/30) (70-30) 100 UNIT/ML injection Inject 54 Units into the skin 2 (two) times daily with a meal.    . l-methylfolate-B6-B12 (METANX) 3-35-2 MG TABS Take 1 tablet by mouth daily.    . Multiple Vitamins-Minerals (ONE-A-DAY MENS VITACRAVES PO) Take 1 tablet by mouth daily.    [name] Kitchen omeprazole (PRILOSEC) 10 MG capsule Take 10 mg by mouth daily.    . rivaroxaban (XARELTO) 20 MG TABS tablet Take 1 tablet (20 mg total) by mouth daily with supper. 90 tablet 3  . simvastatin (ZOCOR) 40 MG tablet Take 40  mg by mouth daily.    [name] Kitchen venlafaxine XR (EFFEXOR-XR) 75 MG 24 hr capsule Take 75 mg by mouth daily.     No current facility-administered medications for this visit.    Allergies:   No Known Allergies  Social History:  The patient  reports that he quit smoking about 21 years ago. His smoking use included Cigars. He has never used smokeless tobacco. He reports that he drinks alcohol.   Family History:  The patient's family history is not on file.   ROS:  Please see the history of present illness.      All other systems reviewed and negative.   PHYSICAL EXAM: VS:  BP 140/82 mmHg  Pulse 41  Ht 6' (1.829 m)  Wt 282 lb 3.2 oz (128.005 kg)  BMI 38.26 kg/m2 Well nourished, well developed, in no acute distress HEENT: erythematous nasal airways with ? Deviated nasal septum.  Patent posterior oropharynx Neck: no JVD Cardiac:  normal S1, S2; RRR; no murmur Lungs:  clear to auscultation bilaterally, no wheezing, rhonchi or rales Abd: soft, nontender, no hepatomegaly Ext: no edema Skin: warm and dry Neuro:  CNs 2-12 intact, no focal abnormalities noted    ASSESSMENT/PLAN: 1.   Moderate OSA by recent  PSG showing an AHI Of 25/hr with O2 desats as low ast 76%.  There was moderate snoring.  Given patient's severity of sleep apnea in the setting of PAF which was documented during sleep study and significant O2 desaturations, recommend proceeding with CPAP titration if we cannot find any surgical causes of OSA.  I will order an ENT consult and if nothing found will proceed with CPAP titration.   2.   PAF followed by Dr. Tamala Julian and noted on sleep study 3.   HTN well controlled.  Continue Benazepril 4.  Obesity   Followup after CPAP titration  Signed, Fransico Him, MD [facility] 07/15/2014 4:19 PM

## 2014-08-11 ENCOUNTER — Other Ambulatory Visit: Payer: Self-pay | Admitting: Dermatology

## 2014-11-03 ENCOUNTER — Telehealth: Payer: Self-pay

## 2014-11-03 ENCOUNTER — Encounter: Payer: Self-pay | Admitting: Gastroenterology

## 2014-11-03 ENCOUNTER — Ambulatory Visit (INDEPENDENT_AMBULATORY_CARE_PROVIDER_SITE_OTHER): Payer: Medicare Other | Admitting: Gastroenterology

## 2014-11-03 VITALS — BP 138/80 | HR 80 | Ht 72.0 in | Wt 279.0 lb

## 2014-11-03 DIAGNOSIS — I480 Paroxysmal atrial fibrillation: Secondary | ICD-10-CM | POA: Insufficient documentation

## 2014-11-03 DIAGNOSIS — Z7901 Long term (current) use of anticoagulants: Secondary | ICD-10-CM | POA: Diagnosis not present

## 2014-11-03 DIAGNOSIS — E119 Type 2 diabetes mellitus without complications: Secondary | ICD-10-CM | POA: Diagnosis not present

## 2014-11-03 DIAGNOSIS — Z86010 Personal history of colonic polyps: Secondary | ICD-10-CM | POA: Diagnosis not present

## 2014-11-03 DIAGNOSIS — Z794 Long term (current) use of insulin: Secondary | ICD-10-CM

## 2014-11-03 MED ORDER — PEG-KCL-NACL-NASULF-NA ASC-C 100 G PO SOLR: 1.0000 | Freq: Once | ORAL | Status: DC

## 2014-11-03 NOTE — Telephone Encounter (Signed)
Discontinue Xarelto 48-72 hours prior to procedure. Resume based on procedural results at discretion of GI physician.

## 2014-11-03 NOTE — Patient Instructions (Signed)
You have been scheduled for a colonoscopy. Please follow written instructions given to you at your visit today.  Please pick up your prep kit at the pharmacy within the next 1-3 days. If you use inhalers (even only as needed), please bring them with you on the day of your procedure.  cc: [name], MD

## 2014-11-03 NOTE — Telephone Encounter (Signed)
  11/03/2014   RE: YOAV OKANE DOB: 02-May-1941 MRN: [id number]   Dear Dr. Tamala Julian,    We have scheduled the above patient for an endoscopic procedure. Our records show that he is on anticoagulation therapy.   Please advise as to how long the patient may come off his therapy of Xarelto prior to the procedure, which is scheduled for 01/05/15.  Please route the completed form to Marlon Pel, [place].  Sincerely,    Marlon Pel, CMA

## 2014-11-03 NOTE — Progress Notes (Signed)
Agree with initial assessment and plans. High risk patient. Management of anticoagulation and diabetic medications as outlined. Confer with cardiology acceptability of holding xeralto.

## 2014-11-03 NOTE — Progress Notes (Signed)
11/03/2014 [name] [id number] Nov 06, 1940   HISTORY OF PRESENT ILLNESS:  This is a 74 year old male who is previously known to [name] for colonoscopy in 06/2009 at which time he was found to have 3 polyps, which were removed from the right colon; two were serrated adenomas and one was a tubular adenoma.  It was recommended that he have a repeat colonoscopy in 5 years from that time.  He is here today to schedule that procedure.  He is on Xarelto for PAF since 02/2014 and is diabetic on both oral agents and insulin.  He does not have any GI complaints.     Past Medical History  Diagnosis Date  . Diabetes mellitus without complication   . Hypertension   . Hyperlipidemia   . Obesity   . GERD (gastroesophageal reflux disease)   . Paroxysmal atrial fibrillation    Past Surgical History  Procedure Laterality Date  . Back surgery  00-02-12    x3  . Colonoscopy    . Knee arthroscopy  005/01/02  . Appendectomy  1962  . Basal cell carcinoma excision  93/06/10    reports that he quit smoking about 22 years ago. His smoking use included Cigars. He has never used smokeless tobacco. He reports that he drinks alcohol. He reports that he does not use illicit drugs. family history is not on file. No Known Allergies    Outpatient Encounter Prescriptions as of 11/03/2014  Medication Sig  . ALPRAZolam (XANAX) 0.5 MG tablet Take 0.5 mg by mouth at bedtime as needed for anxiety.  . benazepril (LOTENSIN) 40 MG tablet Take 40 mg by mouth daily.  . Canagliflozin (INVOKANA) 100 MG TABS Take 100 mg by mouth daily.  . ergocalciferol (VITAMIN D2) 50000 UNITS capsule Take 50,000 Units by mouth once a week.  . furosemide (LASIX) 20 MG tablet Take 20 mg by mouth 2 (two) times daily.  . insulin aspart protamine-insulin aspart (NOVOLOG 70/30) (70-30) 100 UNIT/ML injection Inject 54 Units into the skin 2 (two) times daily with a meal.  . Multiple Vitamins-Minerals (ONE-A-DAY MENS VITACRAVES PO)  Take 1 tablet by mouth daily.  [name] Kitchen omeprazole (PRILOSEC) 10 MG capsule Take 10 mg by mouth daily.  . rivaroxaban (XARELTO) 20 MG TABS tablet Take 1 tablet (20 mg total) by mouth daily with supper.  . simvastatin (ZOCOR) 40 MG tablet Take 40 mg by mouth daily.  [name] Kitchen venlafaxine XR (EFFEXOR-XR) 75 MG 24 hr capsule Take 75 mg by mouth daily.  . peg 3350 powder (MOVIPREP) 100 G SOLR Take 1 kit (200 g total) by mouth once.  . [DISCONTINUED] l-methylfolate-B6-B12 (METANX) 3-35-2 MG TABS Take 1 tablet by mouth daily.     REVIEW OF SYSTEMS  : All other systems reviewed and negative except where noted in the History of Present Illness.   PHYSICAL EXAM: BP 138/80 mmHg  Pulse 80  Ht 6' (1.829 m)  Wt 279 lb (126.554 kg)  BMI 37.83 kg/m2 General: Well developed white male in no acute distress Head: Normocephalic and atraumatic Eyes:  Sclerae anicteric, conjunctiva pink. Ears: Normal auditory acuity Lungs: Clear throughout to auscultation Heart:  Irregularly irregular Abdomen: Soft, non-distended  BS present.  Non-tender. Rectal:  Will be done at the time of colonoscopy. Musculoskeletal: Symmetrical with no gross deformities  Skin: No lesions on visible extremities Extremities: No edema  Neurological: Alert oriented x 4, grossly non-focal Psychological:  Alert and cooperative. Normal mood and affect  ASSESSMENT AND  PLAN: -74 year old male with history of PAF and chronic use of anticoagulation with Xarelto who needs surveillance colonoscopy for history of colon polyps.  Patient also insulin dependent diabetic.  Will schedule colonoscopy with [name].  Will hold Xarelto for 2 days prior to endoscopic procedures - will instruct when and how to resume after procedure. Benefits and risks of procedure explained including risks of bleeding, perforation, infection, missed lesions, reactions to medications and possible need for hospitalization and surgery for complications. Additional rare but real risk of  stroke or other vascular clotting events off of Xarelto also explained and need to seek urgent help if any signs of these problems occur. Will communicate by phone or EMR with patient's  prescribing provider, Dr. Tamala Julian, to confirm that holding Xarelto is reasonable in this case.   Insulin will also be adjusted prior to endoscopic procedure per protocol. Will resume normal dosing after procedure.  CC:  [name] CC:  [name]

## 2014-11-04 NOTE — Telephone Encounter (Signed)
Notified patient to stop Xarelto 48 hours prior to procedure per Dr. Tamala Julian. Patient agreed and verbalized understanding.

## 2014-11-27 ENCOUNTER — Encounter: Payer: Self-pay | Admitting: Internal Medicine

## 2015-01-05 ENCOUNTER — Encounter: Payer: Medicare Other | Admitting: Internal Medicine

## 2015-01-05 ENCOUNTER — Encounter: Payer: Medicare HMO | Admitting: Internal Medicine

## 2015-01-24 NOTE — Progress Notes (Signed)
Cardiology Office Note   Date:  01/26/2015   ID:  [name], DOB 09-04-1941, MRN [id number]  PCP:  Sheela Stack, MD    Chief Complaint  Patient presents with  . Follow-up    sleep apnea  . Follow-up    hypertension      History of Present Illness: ERRON WENGERT is a 74 y.o. male with a history of PAF who presents for followup. He was found to have moderate to severe OSA with AHI 25/hr with O2 desats to 76%. He had significant snoring at home and this was noted on the sleep study as well. He was noted to have PAF during the sleep study as well.  He was seen by ENT and apparently was told that he had no problems but     Past Medical History  Diagnosis Date  . Diabetes mellitus without complication   . Hypertension   . Hyperlipidemia   . Obesity   . GERD (gastroesophageal reflux disease)   . Paroxysmal atrial fibrillation   . Anxiety   . Depression   . Neuropathy   . Cancer     Past Surgical History  Procedure Laterality Date  . Back surgery  00-02-12    x3  . Colonoscopy    . Knee arthroscopy  005/01/02  . Appendectomy  1962  . Basal cell carcinoma excision  93/06/10     Current Outpatient Prescriptions  Medication Sig Dispense Refill  . ALPRAZolam (XANAX) 0.5 MG tablet Take 0.5 mg by mouth at bedtime as needed for anxiety.    . benazepril (LOTENSIN) 40 MG tablet Take 40 mg by mouth daily.    . Canagliflozin (INVOKANA) 100 MG TABS Take 100 mg by mouth daily.    . Cinnamon 500 MG capsule Take 500 mg by mouth daily.    . ergocalciferol (VITAMIN D2) 50000 UNITS capsule Take 50,000 Units by mouth once a week.    . furosemide (LASIX) 20 MG tablet Take 20 mg by mouth 2 (two) times daily.    . insulin aspart protamine-insulin aspart (NOVOLOG 70/30) (70-30) 100 UNIT/ML injection Inject 54 Units into the skin 2 (two) times daily with a meal.    . l-methylfolate-B6-B12 (METANX) 3-35-2 MG TABS Take 1 tablet by mouth daily.    . Multiple  Vitamins-Minerals (ONE-A-DAY MENS VITACRAVES PO) Take 1 tablet by mouth daily.    [name] Kitchen omeprazole (PRILOSEC) 10 MG capsule Take 10 mg by mouth daily.    . rivaroxaban (XARELTO) 20 MG TABS tablet Take 1 tablet (20 mg total) by mouth daily with supper. 90 tablet 3  . simvastatin (ZOCOR) 40 MG tablet Take 40 mg by mouth daily.    [name] Kitchen venlafaxine XR (EFFEXOR-XR) 75 MG 24 hr capsule Take 75 mg by mouth daily.     No current facility-administered medications for this visit.    Allergies:   Review of patient's allergies indicates no known allergies.    Social History:  The patient  reports that he quit smoking about 22 years ago. His smoking use included Cigars. He has never used smokeless tobacco. He reports that he drinks alcohol. He reports that he does not use illicit drugs.   Family History:  The patient's family history includes CVA in his father; Cancer in his mother; Diabetes Mellitus II in his mother; Heart failure in his father; Hypertension in his mother and sister.    ROS:  Please see the history of present illness.   Otherwise, review of  systems are positive for none.   All other systems are reviewed and negative.    PHYSICAL EXAM: VS:  BP 120/58 mmHg  Pulse 54  Ht 6' (1.829 m)  Wt 284 lb (128.822 kg)  BMI 38.51 kg/m2  SpO2 96% , BMI Body mass index is 38.51 kg/(m^2).  EKG:  EKG is not ordered today.    Recent Labs: No results found for requested labs within last 365 days.    Lipid Panel No results found for: CHOL, TRIG, HDL, CHOLHDL, VLDL, LDLCALC, LDLDIRECT    Wt Readings from Last 3 Encounters:  01/26/15 284 lb (128.822 kg)  11/03/14 279 lb (126.554 kg)  07/15/14 282 lb 3.2 oz (128.005 kg)    ASSESSMENT/PLAN: 1. Moderate OSA by recent PSG showing an AHI Of 25/hr with O2 desats as low ast 76%. There was moderate snoring. Given patient's severity of sleep apnea in the setting of PAF which was documented during sleep study and significant O2 desaturations,it was  recommended to proceed with CPAP titration if we could find any surgical causes of OSA.  He saw Dr. Erik Obey but we never received the OV note.  The patient states that he has no surgical causes of obstructive airway disease.  I will, therefore, proceed with CPAP titration.   2. PAF followed by Dr. Tamala Julian and noted on sleep study 3. HTN well controlled. Continue Benazepril 4. Obesity      Current medicines are reviewed at length with the patient today.  The patient does not have concerns regarding medicines.  The following changes have been made:  no change  Labs/ tests ordered today include: see above assessment and plan No orders of the defined types were placed in this encounter.     Disposition:   FU with me in 6 months   Signed, Sueanne Margarita, MD  01/26/2015 11:26 AM    [facility] [place], [place], [place]  [address] Phone: [phone]; Fax: [phone]

## 2015-01-26 ENCOUNTER — Encounter: Payer: Self-pay | Admitting: Cardiology

## 2015-01-26 ENCOUNTER — Ambulatory Visit: Payer: Medicare HMO | Admitting: Cardiology

## 2015-01-26 ENCOUNTER — Ambulatory Visit (INDEPENDENT_AMBULATORY_CARE_PROVIDER_SITE_OTHER): Payer: Medicare Other | Admitting: Cardiology

## 2015-01-26 VITALS — BP 120/58 | HR 54 | Ht 72.0 in | Wt 284.0 lb

## 2015-01-26 DIAGNOSIS — G4733 Obstructive sleep apnea (adult) (pediatric): Secondary | ICD-10-CM

## 2015-01-26 DIAGNOSIS — E669 Obesity, unspecified: Secondary | ICD-10-CM | POA: Diagnosis not present

## 2015-01-26 DIAGNOSIS — I10 Essential (primary) hypertension: Secondary | ICD-10-CM | POA: Diagnosis not present

## 2015-01-26 NOTE — Patient Instructions (Signed)
Medication Instructions:  Call our office periodically to check for Xarelto samples.  Labwork: None  Testing/Procedures: Dr. Radford Pax recommends you have a CPAP TITRATION.  Follow-Up: Your physician wants you to follow-up in: 6 months with Dr. Radford Pax. You will receive a reminder letter in the mail two months in advance. If you don't receive a letter, please call our office to schedule the follow-up appointment.   Any Other Special Instructions Will Be Listed Below (If Applicable).

## 2015-03-09 ENCOUNTER — Encounter (HOSPITAL_BASED_OUTPATIENT_CLINIC_OR_DEPARTMENT_OTHER): Payer: Medicare Other

## 2015-03-31 ENCOUNTER — Telehealth: Payer: Self-pay

## 2015-03-31 NOTE — Telephone Encounter (Signed)
(  6) Samples of Xarelto 20 mg

## 2015-04-09 ENCOUNTER — Ambulatory Visit (HOSPITAL_BASED_OUTPATIENT_CLINIC_OR_DEPARTMENT_OTHER): Payer: Medicare Other | Attending: Cardiology

## 2015-04-09 ENCOUNTER — Telehealth: Payer: Self-pay | Admitting: Cardiology

## 2015-04-09 DIAGNOSIS — I491 Atrial premature depolarization: Secondary | ICD-10-CM | POA: Diagnosis not present

## 2015-04-09 DIAGNOSIS — I480 Paroxysmal atrial fibrillation: Secondary | ICD-10-CM | POA: Insufficient documentation

## 2015-04-09 DIAGNOSIS — R0683 Snoring: Secondary | ICD-10-CM | POA: Insufficient documentation

## 2015-04-09 DIAGNOSIS — G4733 Obstructive sleep apnea (adult) (pediatric): Secondary | ICD-10-CM

## 2015-04-09 DIAGNOSIS — I493 Ventricular premature depolarization: Secondary | ICD-10-CM | POA: Insufficient documentation

## 2015-04-09 DIAGNOSIS — G4730 Sleep apnea, unspecified: Secondary | ICD-10-CM | POA: Diagnosis present

## 2015-04-09 NOTE — Telephone Encounter (Signed)
04-09-15 [place], [phone], per Arlana Pouch, Call [facility]#[id number] no precert required for CPT [phone].  Pt aware.

## 2015-04-19 ENCOUNTER — Telehealth: Payer: Self-pay | Admitting: Cardiology

## 2015-04-19 NOTE — Sleep Study (Signed)
PAP TITRATION  Patient Name: [name], [name] Date: 04/09/2015 MRN: [id number] Gender: Male D.O.B: 1941/07/06 Age (years): 43 Referring Provider: Fransico Him MD, ABSM Interpreting Physician: Fransico Him MD, ABSM RPSGT: Joni Reining  Height (inches): 72 Weight (lbs): 280 BMI: 38 Neck Size: 18.00  CLINICAL INFORMATION The patient is referred for a BiPAP titration to treat sleep apnea.  Date of NPSG, Split Night or HST:05/29/2014  SLEEP STUDY TECHNIQUE As per the AASM Manual for the Scoring of Sleep and Associated Events v2.3 (April 2016) with a hypopnea requiring 4% desaturations.  The channels recorded and monitored were frontal, central and occipital EEG, electrooculogram (EOG), submentalis EMG (chin), nasal and oral airflow, thoracic and abdominal wall motion, anterior tibialis EMG, snore microphone, electrocardiogram, and pulse oximetry. Bilevel positive airway pressure (BPAP) was initiated at the beginning of the study and titrated to treat sleep-disordered breathing.  MEDICATIONS Medications administered by patient during sleep study : No sleep medicine administered. Home Medications:  Xanax, Lotensin, Invokana, Vitamin D, Lasix, Insulin, Prilosec, Xarelto, Zocor, Effexor  RESPIRATORY PARAMETERS Optimal IPAP Pressure (cm): 12 AHI at Optimal Pressure (/hr) 0.0 Optimal EPAP Pressure (cm):8   Overall Minimal O2 (%):87.00  Minimal O2 at Optimal Pressure (%):89.0  SLEEP ARCHITECTURE Start Time:11:05:32 PM  Stop Time:5:08:15 AM Total Time (min):362.7  Total Sleep Time (min):302.0 Sleep Latency (min): 30.9  Sleep Efficiency (%): 83.3 REM Latency (min):173.0   WASO (min): 29.8 Stage N1 (%): 3.31   Stage N2 (%): 78.31  Stage N3 (%): 0.83 Stage R (%):17.55 Supine (%):0.00   Arousal Index (/hr):35.6      CARDIAC DATA The 2 lead EKG demonstrated NSR with frequent PAC's and multiple episodes of paroxysmal atrial fibrillation. The mean heart rate was 75.23 beats per  minute. Other EKG findings include: PVCs.  LEG MOVEMENT DATA The total Periodic Limb Movements of Sleep (PLMS) were 271. The PLMS index was 53.84. A PLMS index of <15 is considered normal in adults.  IMPRESSIONS The patient was tried but was unable to tolerate CPAP due to ongoing respiratory events and inability to tolerate CPAP.   An optimal BiPAP pressure of 12/8cm H2O was selected for this patient. Central sleep apnea was not noted during this titration (CAI = 0.0/h). Mild oxygen desaturations were observed during this titration (min O2 = 87.00%). The patient snored with Soft snoring volume. 2-lead EKG demonstrated: PVCs, NSR with frequent PACs and frequent episodes of paroxysmal atrial fibrillation. Severe periodic limb movements were observed during this study. Arousals associated with PLMs were rare.  DIAGNOSIS Obstructive Sleep Apnea (327.23 [G47.33 ICD-10])  RECOMMENDATIONS Trial of BiPAP therapy on 12/8 cm H2O with a Large size Fisher&Paykel Nasal Mask Eson mask with chin strap and heated humidification. Avoid alcohol, sedatives and other CNS depressants that may worsen sleep apnea and disrupt normal sleep architecture. Sleep hygiene should be reviewed to assess factors that may improve sleep quality. Weight management and regular exercise should be initiated or continued. Return to Sleep provider for re-evaluation after 10 weeks of therapy  Signed: Fransico Him, MD ABSM Diplomate, American Board of Sleep Medicine 04/19/2015

## 2015-04-19 NOTE — Addendum Note (Signed)
Addended by: Fransico Him R on: 04/19/2015 10:18 PM   Modules accepted: Orders

## 2015-04-19 NOTE — Telephone Encounter (Signed)
Pt had successful PAP titration. Please setup appointment in 10 weeks. Please let [facility] know that order for PAP is in EPIC.   

## 2015-04-20 NOTE — Telephone Encounter (Signed)
Patient is aware of results.   Once he is set up with machine we will schedule 10 week follow-up.  Patient stated verbal understanding  [facility] has been notified.

## 2015-04-27 ENCOUNTER — Ambulatory Visit (AMBULATORY_SURGERY_CENTER): Payer: Self-pay

## 2015-04-27 VITALS — Ht 71.0 in | Wt 284.8 lb

## 2015-04-27 DIAGNOSIS — Z860100 Personal history of colon polyps, unspecified: Secondary | ICD-10-CM

## 2015-04-27 NOTE — Progress Notes (Signed)
No allergies to eggs or soy No diet/weight loss meds No home oxygen No past problems with anesthesia  Refused emmi 

## 2015-05-12 ENCOUNTER — Encounter: Payer: Self-pay | Admitting: Internal Medicine

## 2015-05-12 ENCOUNTER — Ambulatory Visit (AMBULATORY_SURGERY_CENTER): Payer: Medicare Other | Admitting: Internal Medicine

## 2015-05-12 VITALS — BP 157/60 | HR 67 | Temp 96.5°F | Resp 29 | Ht 71.0 in | Wt 284.0 lb

## 2015-05-12 DIAGNOSIS — D122 Benign neoplasm of ascending colon: Secondary | ICD-10-CM

## 2015-05-12 DIAGNOSIS — Z86010 Personal history of colonic polyps: Secondary | ICD-10-CM | POA: Diagnosis present

## 2015-05-12 LAB — GLUCOSE, CAPILLARY
GLUCOSE-CAPILLARY: 144 mg/dL — AB (ref 65–99)
GLUCOSE-CAPILLARY: 123 mg/dL — AB (ref 65–99)

## 2015-05-12 MED ORDER — SODIUM CHLORIDE 0.9 % IV SOLN: 500.0000 mL | INTRAVENOUS | Status: DC

## 2015-05-12 NOTE — Progress Notes (Signed)
Report to PACU, RN, vss, BBS= Clear.  

## 2015-05-12 NOTE — Op Note (Signed)
[facility]  [address] [place], [id number]   COLONOSCOPY PROCEDURE REPORT  PATIENT: [name], [name]  MR#: [id number] BIRTHDATE: Mar 15, 1941 , 73  yrs. old GENDER: male ENDOSCOPIST: Eustace Quail, MD REFERRED NI:[id number] Program Recall PROCEDURE DATE:  05/12/2015 PROCEDURE:   Colonoscopy, surveillance and Colonoscopy with snare polypectomy x 1 First Screening Colonoscopy - Avg.  risk and is 50 yrs.  old or older - No.  Prior Negative Screening - Now for repeat screening. N/A  History of Adenoma - Now for follow-up colonoscopy & has been > or = to 3 yrs.  Yes hx of adenoma.  Has been 3 or more years since last colonoscopy.  Polyps removed today? Yes ASA CLASS:   Class III INDICATIONS:Surveillance due to prior colonic neoplasia and PH Colon Adenoma. 06-2009 (TA and SSP) MEDICATIONS: Monitored anesthesia care and Propofol 300 mg IV  DESCRIPTION OF PROCEDURE:   After the risks benefits and alternatives of the procedure were thoroughly explained, informed consent was obtained.  The digital rectal exam revealed no abnormalities of the rectum.   The [id number]  endoscope was introduced through the anus and advanced to the cecum, which was identified by both the appendix and ileocecal valve. No adverse events experienced.   The quality of the prep was good.  (MoviPrep was used)  The instrument was then slowly withdrawn as the colon was fully examined. Estimated blood loss is zero unless otherwise noted in this procedure report.  COLON FINDINGS: A single polyp measuring 1 mm in size was found in the ascending colon.  A polypectomy was performed with a cold snare.  The resection was complete, the polyp tissue was completely retrieved and sent to histology.   There was mild diverticulosis noted in the left colon.   The examination was otherwise normal. Retroflexed views revealed internal hemorrhoids. The time to cecum = 3.7 Withdrawal time = 16.3   The  scope was withdrawn and the procedure completed. COMPLICATIONS: There were no immediate complications.  ENDOSCOPIC IMPRESSION: 1.   Single polyp was found in the ascending colon; polypectomy was performed with a cold snare 2.   Mild diverticulosis was noted in the left colon 3.   The examination was otherwise normal  RECOMMENDATIONS: 1.  Resume Xarelto today. 2.  Return to the care of your primary provider.  GI follow up as needed  eSigned:  Eustace Quail, MD 05/12/2015 8:39 AM   cc: The Patient and [name], MD

## 2015-05-12 NOTE — Progress Notes (Signed)
Called to room to assist during endoscopic procedure.  Patient ID and intended procedure confirmed with present staff. Received instructions for my participation in the procedure from the performing physician.  

## 2015-05-12 NOTE — Patient Instructions (Signed)
YOU HAD AN ENDOSCOPIC PROCEDURE TODAY AT [facility]:   Refer to the procedure report that was given to you for any specific questions about what was found during the examination.  If the procedure report does not answer your questions, please call your gastroenterologist to clarify.  If you requested that your care partner not be given the details of your procedure findings, then the procedure report has been included in a sealed envelope for you to review at your convenience later.  YOU SHOULD EXPECT: Some feelings of bloating in the abdomen. Passage of more gas than usual.  Walking can help get rid of the air that was put into your GI tract during the procedure and reduce the bloating. If you had a lower endoscopy (such as a colonoscopy or flexible sigmoidoscopy) you may notice spotting of blood in your stool or on the toilet paper. If you underwent a bowel prep for your procedure, you may not have a normal bowel movement for a few days.  Please Note:  You might notice some irritation and congestion in your nose or some drainage.  This is from the oxygen used during your procedure.  There is no need for concern and it should clear up in a day or so.  SYMPTOMS TO REPORT IMMEDIATELY:   Following lower endoscopy (colonoscopy or flexible sigmoidoscopy):  Excessive amounts of blood in the stool  Significant tenderness or worsening of abdominal pains  Swelling of the abdomen that is new, acute  Fever of 100F or higher  For urgent or emergent issues, a gastroenterologist can be reached at any hour by calling [phone].   DIET: Your first meal following the procedure should be a small meal and then it is ok to progress to your normal diet. Heavy or fried foods are harder to digest and may make you feel nauseous or bloated.  Likewise, meals heavy in dairy and vegetables can increase bloating.  Drink plenty of fluids but you should avoid alcoholic beverages for 24 hours. Increase  the fiber in your diet.  ACTIVITY:  You should plan to take it easy for the rest of today and you should NOT DRIVE or use heavy machinery until tomorrow (because of the sedation medicines used during the test).    FOLLOW UP: Our staff will call the number listed on your records the next business day following your procedure to check on you and address any questions or concerns that you may have regarding the information given to you following your procedure. If we do not reach you, we will leave a message.  However, if you are feeling well and you are not experiencing any problems, there is no need to return our call.  We will assume that you have returned to your regular daily activities without incident.  If any biopsies were taken you will be contacted by phone or by letter within the next 1-3 weeks.  Please call us at [phone] if you have not heard about the biopsies in 3 weeks.    SIGNATURES/CONFIDENTIALITY: You and/or your care partner have signed paperwork which will be entered into your electronic medical record.  These signatures attest to the fact that that the information above on your After Visit Summary has been reviewed and is understood.  Full responsibility of the confidentiality of this discharge information lies with you and/or your care-partner.  You may resume your xarelto today per [name].  Thank-you for choosing [place] for your healthcare needs. Read  all of the handouts given to you by your recovery room nurse.

## 2015-05-13 ENCOUNTER — Telehealth: Payer: Self-pay | Admitting: *Deleted

## 2015-05-13 NOTE — Telephone Encounter (Signed)
Message left

## 2015-05-20 ENCOUNTER — Encounter: Payer: Self-pay | Admitting: Internal Medicine

## 2015-06-29 ENCOUNTER — Encounter: Payer: Self-pay | Admitting: Cardiology

## 2015-08-04 ENCOUNTER — Encounter: Payer: Self-pay | Admitting: Cardiology

## 2015-08-04 ENCOUNTER — Ambulatory Visit (INDEPENDENT_AMBULATORY_CARE_PROVIDER_SITE_OTHER): Payer: Medicare Other | Admitting: Cardiology

## 2015-08-04 VITALS — BP 134/76 | HR 90 | Ht 72.0 in | Wt 283.0 lb

## 2015-08-04 DIAGNOSIS — G4733 Obstructive sleep apnea (adult) (pediatric): Secondary | ICD-10-CM | POA: Diagnosis not present

## 2015-08-04 DIAGNOSIS — E669 Obesity, unspecified: Secondary | ICD-10-CM

## 2015-08-04 DIAGNOSIS — I4891 Unspecified atrial fibrillation: Secondary | ICD-10-CM

## 2015-08-04 DIAGNOSIS — I10 Essential (primary) hypertension: Secondary | ICD-10-CM

## 2015-08-04 MED ORDER — RIVAROXABAN 20 MG PO TABS: 20.0000 mg | ORAL_TABLET | Freq: Every day | ORAL | Status: DC

## 2015-08-04 NOTE — Addendum Note (Signed)
Addended by: Harland German A on: 08/04/2015 05:12 PM   Modules accepted: Orders

## 2015-08-04 NOTE — Progress Notes (Signed)
Cardiology Office Note   Date:  08/04/2015   ID:  [name], DOB May 31, 1941, MRN [id number]  PCP:  Charles Stack, MD    Chief Complaint  Patient presents with  . Follow-up    refilled xarelto      History of Present Illness: [name] is a 74 y.o. male with a history of PAF who presents for followup. He was found to have moderate to severe OSA with AHI 25/hr with O2 desats to 76%. He had significant snoring at home and this was noted on the sleep study as well. He was noted to have PAF during the sleep study as well. He was seen by ENT and apparently was told that he had no surgical cause for OSA.  He underwent CPAP titration but could not adequately be titrated so he underwent BiPAP titration to 12/8cm H2O.  He now presents for followup.  He is doing well with his device.  He tolerates his nasal mask and feels the pressure is adequate.  He did have a problem with breathing through his mouth last night.  Since going on BiPAP he feels more rested in the am and has not daytime sleepiness.  He does not have to get up and urinate as much as night since going on the PAP.      Past Medical History  Diagnosis Date  . Diabetes mellitus without complication ([place])   . Hypertension   . Hyperlipidemia   . Obesity   . GERD (gastroesophageal reflux disease)   . Paroxysmal atrial fibrillation ([facility])   . Anxiety   . Depression   . Neuropathy ([place])   . Cancer [facility])     Past Surgical History  Procedure Laterality Date  . Back surgery  00-02-12    x3  . Colonoscopy    . Knee arthroscopy  005/01/02  . Appendectomy  1962  . Basal cell carcinoma excision  93/06/10     Current Outpatient Prescriptions  Medication Sig Dispense Refill  . ALPRAZolam (XANAX) 0.5 MG tablet Take 0.5 mg by mouth at bedtime as needed for anxiety.    . benazepril (LOTENSIN) 40 MG tablet Take 40 mg by mouth daily.    . Canagliflozin (INVOKANA) 100 MG TABS Take 100 mg by mouth  daily.    . ergocalciferol (VITAMIN D2) 50000 UNITS capsule Take 50,000 Units by mouth once a week.    . furosemide (LASIX) 20 MG tablet Take 20 mg by mouth 2 (two) times daily.    [name] Kitchen gabapentin (NEURONTIN) 300 MG capsule Take 300 mg by mouth daily.    . insulin aspart protamine-insulin aspart (NOVOLOG 70/30) (70-30) 100 UNIT/ML injection Inject 44 Units into the skin 2 (two) times daily with a meal.     . metFORMIN (GLUCOPHAGE) 500 MG tablet Take by mouth 2 (two) times daily with a meal.    . Multiple Vitamins-Minerals (ONE-A-DAY MENS VITACRAVES PO) Take 1 tablet by mouth daily.    [name] Kitchen omeprazole (PRILOSEC) 10 MG capsule Take 10 mg by mouth daily.    . rivaroxaban (XARELTO) 20 MG TABS tablet Take 1 tablet (20 mg total) by mouth daily with supper. 90 tablet 3  . simvastatin (ZOCOR) 40 MG tablet Take 40 mg by mouth daily.    [name] Kitchen venlafaxine XR (EFFEXOR-XR) 75 MG 24 hr capsule Take 75 mg by mouth daily.     No current facility-administered medications  for this visit.    Allergies:   Review of patient's allergies indicates no known allergies.    Social History:  The patient  reports that he quit smoking about 23 years ago. His smoking use included Cigars. He has never used smokeless tobacco. He reports that he drinks alcohol. He reports that he does not use illicit drugs.   Family History:  The patient's family history includes CVA in his father; Cancer in his mother; Diabetes Mellitus II in his mother; Heart failure in his father; Hypertension in his mother and sister. There is no history of Colon cancer.    ROS:  Please see the history of present illness.   Otherwise, review of systems are positive for none.   All other systems are reviewed and negative.    PHYSICAL EXAM: VS:  BP 134/76 mmHg  Pulse 90  Ht 6' (1.829 m)  Wt 128.368 kg (283 lb)  BMI 38.37 kg/m2 , BMI Body mass index is 38.37 kg/(m^2). GEN: Well nourished, well developed, in no acute distress HEENT: normal Neck: no JVD,  carotid bruits, or masses Cardiac: RRR; no murmurs, rubs, or gallops,no edema  Respiratory:  clear to auscultation bilaterally, normal work of breathing GI: soft, nontender, nondistended, + BS MS: no deformity or atrophy Skin: warm and dry, no rash Neuro:  Strength and sensation are intact Psych: euthymic mood, full affect   EKG:  EKG is not ordered today.    Recent Labs: No results found for requested labs within last 365 days.    Lipid Panel No results found for: CHOL, TRIG, HDL, CHOLHDL, VLDL, LDLCALC, LDLDIRECT    Wt Readings from Last 3 Encounters:  08/04/15 128.368 kg (283 lb)  05/12/15 128.822 kg (284 lb)  04/27/15 129.184 kg (284 lb 12.8 oz)    ASSESSMENT/PLAN: 1. Moderate OSA by recent PSG showing an AHI Of 25/hr with O2 desats as low ast 76%. There was moderate snoring.Now on BiPAP at 12/8cm H2O.  He is tolerating his device well.  His last d/l showed an AHI of 1 but his compliance was only at 63%.  I encouraged him to try use the device nightly at least 5 hours nightly.  I will add a chin strap for snoring. 2. PAF followed by Dr. Tamala Julian and noted on sleep study.  EKG today shows atrial fibrillation.  On Xarelto.  Needs to followup with Dr. Tamala Julian.   3. HTN well controlled. Continue Benazepril 4. Obesity - I have encouraged him to try to get into an exercise program as his knee tolerates.  He has cut back on Carbs due to elevated HbA1C.   Current medicines are reviewed at length with the patient today.  The patient does not have concerns regarding medicines.  The following changes have been made:  no change  Labs/ tests ordered today: See above Assessment and Plan No orders of the defined types were placed in this encounter.     Disposition:   FU with me in 1 year  Signed, Sueanne Margarita, MD  08/04/2015 2:53 PM    [facility] [address], [place], [place]  [address] Phone: [phone]; Fax: [phone]

## 2015-08-04 NOTE — Patient Instructions (Signed)
Medication Instructions:  Your physician recommends that you continue on your current medications as directed. Please refer to the Current Medication list given to you today.   Labwork: None  Testing/Procedures: None  Follow-Up: Your physician recommends that you schedule a follow-up appointment ASAP with Dr. Tamala Julian.  Your physician wants you to follow-up in: 1 year with Dr. Radford Pax. You will receive a reminder letter in the mail two months in advance. If you don't receive a letter, please call our office to schedule the follow-up appointment.   Any Other Special Instructions Will Be Listed Below (If Applicable).     If you need a refill on your cardiac medications before your next appointment, please call your pharmacy.

## 2015-08-14 ENCOUNTER — Other Ambulatory Visit: Payer: Self-pay | Admitting: Interventional Cardiology

## 2015-08-14 ENCOUNTER — Encounter: Payer: Self-pay | Admitting: Interventional Cardiology

## 2015-08-14 ENCOUNTER — Ambulatory Visit (INDEPENDENT_AMBULATORY_CARE_PROVIDER_SITE_OTHER): Payer: Medicare Other | Admitting: Interventional Cardiology

## 2015-08-14 VITALS — BP 138/68 | HR 67 | Ht 72.0 in | Wt 283.0 lb

## 2015-08-14 DIAGNOSIS — G4733 Obstructive sleep apnea (adult) (pediatric): Secondary | ICD-10-CM | POA: Diagnosis not present

## 2015-08-14 DIAGNOSIS — I4891 Unspecified atrial fibrillation: Secondary | ICD-10-CM | POA: Diagnosis not present

## 2015-08-14 DIAGNOSIS — I739 Peripheral vascular disease, unspecified: Secondary | ICD-10-CM

## 2015-08-14 DIAGNOSIS — E669 Obesity, unspecified: Secondary | ICD-10-CM | POA: Diagnosis not present

## 2015-08-14 DIAGNOSIS — R0989 Other specified symptoms and signs involving the circulatory and respiratory systems: Secondary | ICD-10-CM

## 2015-08-14 DIAGNOSIS — I10 Essential (primary) hypertension: Secondary | ICD-10-CM | POA: Diagnosis not present

## 2015-08-14 DIAGNOSIS — I2510 Atherosclerotic heart disease of native coronary artery without angina pectoris: Secondary | ICD-10-CM

## 2015-08-14 DIAGNOSIS — I779 Disorder of arteries and arterioles, unspecified: Secondary | ICD-10-CM

## 2015-08-14 NOTE — Patient Instructions (Addendum)
Medication Instructions:  Your physician recommends that you continue on your current medications as directed. Please refer to the Current Medication list given to you today.   Labwork: NONE ORDERED  Testing/Procedures: Your physician has requested that you have a carotid duplex. This test is an ultrasound of the carotid arteries in your neck. It looks at blood flow through these arteries that supply the brain with blood. Allow one hour for this exam. There are no restrictions or special instructions.  Your physician has recommended that you wear a holter monitor. Holter monitors are medical devices that record the heart's electrical activity. Doctors most often use these monitors to diagnose arrhythmias. Arrhythmias are problems with the speed or rhythm of the heartbeat. The monitor is a small, portable device. You can wear one while you do your normal daily activities. This is usually used to diagnose what is causing palpitations/syncope (passing out).    Follow-Up: Your physician wants you to follow-up in: [place].  You will receive a reminder letter in the mail two months in advance. If you don't receive a letter, please call our office to schedule the follow-up appointment.   Any Other Special Instructions Will Be Listed Below (If Applicable).  Cardiac Event Monitoring A cardiac event monitor is a small recording device used to help detect abnormal heart rhythms (arrhythmias). The monitor is used to record heart rhythm when noticeable symptoms such as the following occur:  Fast heartbeats (palpitations), such as heart racing or fluttering.  Dizziness.  Fainting or light-headedness.  Unexplained weakness. The monitor is wired to two electrodes placed on your chest. Electrodes are flat, sticky disks that attach to your skin. The monitor can be worn for up to 30 days. You will wear the monitor at all times, except when bathing.  HOW TO USE YOUR CARDIAC EVENT MONITOR A  technician will prepare your chest for the electrode placement. The technician will show you how to place the electrodes, how to work the monitor, and how to replace the batteries. Take time to practice using the monitor before you leave the office. Make sure you understand how to send the information from the monitor to your health care provider. This requires a telephone with a landline, not a cell phone. You need to:  Wear your monitor at all times, except when you are in water:  Do not get the monitor wet.  Take the monitor off when bathing. Do not swim or use a hot tub with it on.  Keep your skin clean. Do not put body lotion or moisturizer on your chest.  Change the electrodes daily or any time they stop sticking to your skin. You might need to use tape to keep them on.  It is possible that your skin under the electrodes could become irritated. To keep this from happening, try to put the electrodes in slightly different places on your chest. However, they must remain in the area under your left breast and in the upper right section of your chest.  Make sure the monitor is safely clipped to your clothing or in a location close to your body that your health care provider recommends.  Press the button to record when you feel symptoms of heart trouble, such as dizziness, weakness, light-headedness, palpitations, thumping, shortness of breath, unexplained weakness, or a fluttering or racing heart. The monitor is always on and records what happened slightly before you pressed the button, so do not worry about being too late to get good  information.  Keep a diary of your activities, such as walking, doing chores, and taking medicine. It is especially important to note what you were doing when you pushed the button to record your symptoms. This will help your health care provider determine what might be contributing to your symptoms. The information stored in your monitor will be reviewed by your  health care provider alongside your diary entries.  Send the recorded information as recommended by your health care provider. It is important to understand that it will take some time for your health care provider to process the results.  Change the batteries as recommended by your health care provider. SEEK IMMEDIATE MEDICAL CARE IF:   You have chest pain.  You have extreme difficulty breathing or shortness of breath.  You develop a very fast heartbeat that persists.  You develop dizziness that does not go away.  You faint or constantly feel you are about to faint.   This information is not intended to replace advice given to you by your health care provider. Make sure you discuss any questions you have with your health care provider.   Document Released: 06/07/2008 Document Revised: 09/19/2014 Document Reviewed: 02/25/2013 Elsevier Interactive Patient Education [employer].    If you need a refill on your cardiac medications before your next appointment, please call your pharmacy.

## 2015-08-14 NOTE — Addendum Note (Signed)
Addended by: Daneen Schick on: 08/14/2015 09:39 AM   Modules accepted: Level of Service

## 2015-08-14 NOTE — Progress Notes (Signed)
Cardiology Office Note   Date:  08/14/2015   ID:  [name], DOB [birth year], MRN [id number]  PCP:  Sheela Stack, MD  Cardiologist:  [name], MD   Chief Complaint  Patient presents with  . Atrial Fibrillation      History of Present Illness: [name] is a 74 y.o. male who presents for asymptomatic atrial fibrillation, obstructive sleep apnea, hypertension, diabetes, and hyperlipidemia.  There are no cardiopulmonary complaints. He is relatively sedentary due to bilateral knee arthritis. He has not had syncope, orthopnea, or PND.    Past Medical History  Diagnosis Date  . Diabetes mellitus without complication ([place])   . Hypertension   . Hyperlipidemia   . Obesity   . GERD (gastroesophageal reflux disease)   . Paroxysmal atrial fibrillation (H[facility])   . Anxiety   . Depression   . Neuropathy ([place])   . Cancer [facility])     Past Surgical History  Procedure Laterality Date  . Back surgery  00-02-12    x3  . Colonoscopy    . Knee arthroscopy  005/01/02  . Appendectomy  1962  . Basal cell carcinoma excision  93/06/10     Current Outpatient Prescriptions  Medication Sig Dispense Refill  . ALPRAZolam (XANAX) 0.5 MG tablet Take 0.5 mg by mouth at bedtime as needed for anxiety.    . benazepril (LOTENSIN) 40 MG tablet Take 40 mg by mouth daily.    . Canagliflozin (INVOKANA) 100 MG TABS Take 100 mg by mouth daily.    . ergocalciferol (VITAMIN D2) 50000 UNITS capsule Take 50,000 Units by mouth once a week.    . furosemide (LASIX) 20 MG tablet Take 20 mg by mouth 2 (two) times daily.    [name] Kitchen gabapentin (NEURONTIN) 300 MG capsule Take 300 mg by mouth daily.    . insulin aspart protamine-insulin aspart (NOVOLOG 70/30) (70-30) 100 UNIT/ML injection Inject 44 Units into the skin 2 (two) times daily with a meal.     . metFORMIN (GLUCOPHAGE) 500 MG tablet Take by mouth 2 (two) times daily with a meal.    . Multiple Vitamins-Minerals (ONE-A-DAY MENS VITACRAVES  PO) Take 1 tablet by mouth daily.    [name] Kitchen omeprazole (PRILOSEC) 10 MG capsule Take 10 mg by mouth daily.    . rivaroxaban (XARELTO) 20 MG TABS tablet Take 1 tablet (20 mg total) by mouth daily with supper. 90 tablet 3  . simvastatin (ZOCOR) 40 MG tablet Take 40 mg by mouth daily.    [name] Kitchen venlafaxine XR (EFFEXOR-XR) 75 MG 24 hr capsule Take 75 mg by mouth daily.     No current facility-administered medications for this visit.    Allergies:   Review of patient's allergies indicates no known allergies.    Social History:  The patient  reports that he quit smoking about 23 years ago. His smoking use included Cigars. He has never used smokeless tobacco. He reports that he drinks alcohol. He reports that he does not use illicit drugs.   Family History:  The patient's family history includes CVA in his father; Cancer in his mother; Diabetes Mellitus II in his mother; Heart failure in his father; Hypertension in his mother and sister. There is no history of Colon cancer.    ROS:  Please see the history of present illness.   Otherwise, review of systems are positive for poor diabetes control, weight gain, and increasing immobility. Difficulty with bilateral knee discomfort that prevents physical activity.[name] Kitchen  All other systems are reviewed and negative.    PHYSICAL EXAM: VS:  BP 138/68 mmHg  Pulse 67  Ht 6' (1.829 m)  Wt 283 lb (128.368 kg)  BMI 38.37 kg/m2  SpO2 95% , BMI Body mass index is 38.37 kg/(m^2). GEN: Well nourished, well developed, in no acute distress HEENT: normal Neck: no JVD, or masses. There is a faint right carotid bruit Cardiac: IIRR.  There is no murmur, rub, or gallop. There is no edema. Respiratory:  clear to auscultation bilaterally, normal work of breathing. GI: soft, nontender, nondistended, + BS MS: no deformity or atrophy Skin: warm and dry, no rash Neuro:  Strength and sensation are intact Psych: euthymic mood, full affect   EKG:  EKG is not ordered today. The ekg  from last week reveals atrial fib with rapid ventricular response.   Recent Labs: No results found for requested labs within last 365 days.    Lipid Panel No results found for: CHOL, TRIG, HDL, CHOLHDL, VLDL, LDLCALC, LDLDIRECT    Wt Readings from Last 3 Encounters:  08/14/15 283 lb (128.368 kg)  08/04/15 283 lb (128.368 kg)  05/12/15 284 lb (128.822 kg)      Other studies Reviewed: Additional studies/ records that were reviewed today include: Reviewed EKG from the last office visit at the sleep clinic. The findings include ECG revealed A. fib with rapid rate.    ASSESSMENT AND PLAN:  1. Atrial fibrillation, unspecified In atrial fibrillation when seen in the sleep clinic last week.  2. Essential hypertension Controlled  3. Moderate obstructive sleep apnea Excellent therapy with C Pap  4. Carotid artery disease Faint left carotid bruit. Prior history of mild plaque buildup on a screening lifeline. Doppler study performed 2 years ago.     Current medicines are reviewed at length with the patient today.  The patient has the following concerns regarding medicines: None other than donut hole related to Xarelto.  The following changes/actions have been instituted:    Bilateral carotid Doppler study  24-hour Holter to ensure rate control  Labs/ tests ordered today include:   Orders Placed This Encounter  Procedures  . Holter monitor - 24 hour     Disposition:   FU with HS in 1 year  Signed, [name], MD  08/14/2015 9:14 AM    [facility] [place], [place], [place]  [address] Phone: [phone]; Fax: [phone]

## 2015-08-18 ENCOUNTER — Encounter: Payer: Self-pay | Admitting: Cardiology

## 2015-08-20 ENCOUNTER — Ambulatory Visit (INDEPENDENT_AMBULATORY_CARE_PROVIDER_SITE_OTHER): Payer: Medicare Other

## 2015-08-20 DIAGNOSIS — I4891 Unspecified atrial fibrillation: Secondary | ICD-10-CM

## 2015-08-21 ENCOUNTER — Ambulatory Visit (HOSPITAL_COMMUNITY)
Admission: RE | Admit: 2015-08-21 | Discharge: 2015-08-21 | Disposition: A | Discharge status: Home / Self Care | Payer: Medicare Other | Source: Ambulatory Visit | Attending: Cardiology | Admitting: Cardiology

## 2015-08-21 DIAGNOSIS — E785 Hyperlipidemia, unspecified: Secondary | ICD-10-CM | POA: Diagnosis not present

## 2015-08-21 DIAGNOSIS — I10 Essential (primary) hypertension: Secondary | ICD-10-CM | POA: Diagnosis not present

## 2015-08-21 DIAGNOSIS — E119 Type 2 diabetes mellitus without complications: Secondary | ICD-10-CM | POA: Insufficient documentation

## 2015-08-21 DIAGNOSIS — R0989 Other specified symptoms and signs involving the circulatory and respiratory systems: Secondary | ICD-10-CM | POA: Diagnosis not present

## 2015-08-21 DIAGNOSIS — I6523 Occlusion and stenosis of bilateral carotid arteries: Secondary | ICD-10-CM | POA: Insufficient documentation

## 2015-08-24 ENCOUNTER — Encounter (HOSPITAL_COMMUNITY): Payer: Medicare Other

## 2015-08-27 ENCOUNTER — Encounter: Payer: Medicare Other | Attending: Endocrinology | Admitting: Dietician

## 2015-08-27 ENCOUNTER — Encounter: Payer: Self-pay | Admitting: Dietician

## 2015-08-27 VITALS — Ht 71.5 in | Wt 285.1 lb

## 2015-08-27 DIAGNOSIS — Z713 Dietary counseling and surveillance: Secondary | ICD-10-CM | POA: Insufficient documentation

## 2015-08-27 DIAGNOSIS — Z794 Long term (current) use of insulin: Secondary | ICD-10-CM | POA: Diagnosis not present

## 2015-08-27 DIAGNOSIS — E119 Type 2 diabetes mellitus without complications: Secondary | ICD-10-CM | POA: Insufficient documentation

## 2015-08-27 NOTE — Progress Notes (Signed)
Medical Nutrition Therapy:  Appt start time: [id number] end time:  1540.   Assessment:  Primary concerns today: [name] is here since is trying to get his blood sugar lower so he can have knee surgery. Has been working on portion control and managing blood sugar since early November. Limiting bread and other carbs. Has not really lost weight but blood sugar has been lower. Hgb was 8.5% in late October and it needs to get below 7.5% in order to do surgery. Not sure when next Hgb A1c will be. Has had diabetes since age 39 and came to [facility] about 3 years ago for diabetes education.  Is comfortable with weight around 220-240 lbs. Started taking insulin around 5 years which is when he started gaining weight. Currently also taking Invokana and Metformin. Current weight (285 lbs) is highest weight. Was doing Weight Watchers about 4 years ago and has lost 50 lbs a couple of times in his life. Does not like writing down what he is eating.   Tests blood sugar 1 x day in the morning fasting and occasionally tests before dinner averaging 90-120 mg/dl most days.  These are the lowest readings he has had in years. Has been told to try to keep blood sugar below 150 mg/dl.  Previously was averaging 135-220 mg/dl. Cut back on snacking, especially at night and is also eating a lot less food than before.   Lives with his wife and both of them shop and tend to pick up food for the day. Wife also has diabetes and takes insulin and is not looking to make any changes to what is eating. They don't plan meals ahead of time. Wife feels like she is "done cooking" since she cooked for years. Orders out for [place] food once every couple of weeks and goes out to eat about 4 x week. Does not miss or skip meals.   Blood sugar is low about 1 x week overnight. Feels shaky or eyes flutter when low. Eats a regular meal when low or if overnight will have something to eat if low.   [facility]    Preferred Learning Style:   No preference indicated   Learning Readiness:   Ready  MEDICATIONS: see list   DIETARY INTAKE:  Usual eating pattern includes 3 meals and 1-2 snacks per day.  Avoided foods include: white rice, potatoes, hominey    24-hr recall:  B ( AM): peanut butter with 1 packet of flavored oatmeal with 1-2 bananas and coffee with flavored creamer  Snk ( AM): none L ( PM): salad from [employer] or taco salad or ham sandwich with applesauce or fruit cocktail Snk ( PM): none or apple  D ( PM): scrambled eggs and bacon, hamburger with sweet potato and peas, Chinese, pizza, pork chops, sweet potato, steak with vegetable and salad   Snk ( PM): fruit cocktail or popocorn Beverages: 2 cups coffee with flavored creamer, water, Diet Pepsi 2 x week, 1-2 beers at night    Usual physical activity: none d/t knee pain  Estimated energy needs: 2000 calories 225 g carbohydrates 150 g protein 56 g fat  Progress Towards Goal(s):  In progress.   Nutritional Diagnosis:  NB-1.1 Food and nutrition-related knowledge deficit As related to hx of excess consumption of carbohydrates and large portion sizes.  As evidenced by Hgb A1c of 8.5% and BMI of 39.2.    Intervention:  Nutrition counseling provided. Plan: Aim  to fill half of your plate with vegetables at lunch and dinner. Have protein the size of the palm of your hand at meals (quarter of your plate). Have starch/fruit on the other quarter of your plate (what you can hold in your hand). Have protein (peanut butter, egg, cheese) with carbs if you have a snack. Test blood sugar 2 hours after meals (rotate each day) to see make sure blood sugar isn't going too high. [facility].  Portion size of peanut butter is 2 tablespoons.  Teaching Method Utilized:  Visual Auditory Hands on  Handouts given during visit include:  MyPlate Handout  15 g CHO Snacks  Meal card  Barriers to  learning/adherence to lifestyle change: knee pain  Demonstrated degree of understanding via:  Teach Back   Monitoring/Evaluation:  Dietary intake, exercise, and body weight prn.

## 2015-08-27 NOTE — Patient Instructions (Signed)
Aim to fill half of your plate with vegetables at lunch and dinner. Have protein the size of the palm of your hand at meals (quarter of your plate). Have starch/fruit on the other quarter of your plate (what you can hold in your hand). Have protein (peanut butter, egg, cheese) with carbs if you have a snack. Test blood sugar 2 hours after meals (rotate each day) to see make sure blood sugar isn't going too high. [facility].  Portion size of peanut butter is 2 tablespoons.

## 2015-10-15 ENCOUNTER — Ambulatory Visit: Payer: Self-pay | Admitting: Orthopedic Surgery

## 2015-10-15 NOTE — Progress Notes (Signed)
Preoperative surgical orders have been place into the Epic hospital system for [name] on 10/15/2015, 12:53 PM  by Mickel Crow for surgery on 11-02-15.  Preop Total Knee orders including Experal, IV Tylenol, and IV Decadron as long as there are no contraindications to the above medications. Arlee Muslim, PA-C

## 2015-10-23 NOTE — Patient Instructions (Addendum)
[name]  10/23/2015   Your procedure is scheduled on:  November 02, 2015  Report to [facility] Main  Entrance take [facility]  elevators to 3rd floor to  [place] at 10:55 AM.  Call this number if you have problems the morning of surgery [phone]   Remember: ONLY 1 PERSON MAY GO WITH YOU TO SHORT STAY TO GET  READY MORNING OF [place].  Do not eat food after midnight.  May have clear liquids until 7am.  Then nothing by mouth.   CLEAR LIQUID DIET   Foods Allowed                                                                     Foods Excluded  Coffee and tea, regular and decaf                             liquids that you cannot  Plain Jell-O in any flavor                                             see through such as: Fruit ices (not with fruit pulp)                                     milk, soups, orange juice  Iced Popsicles                                    All solid food Carbonated beverages, regular and diet                                    Cranberry, grape and apple juices Sports drinks like Gatorade Lightly seasoned clear broth or consume(fat free) Sugar, honey syrup  Sample Menu Breakfast                                Lunch                                     Supper Cranberry juice                    Beef broth                            Chicken broth Jell-O                                     Grape juice  Apple juice Coffee or tea                        Jell-O                                      Popsicle                                                Coffee or tea                        Coffee or tea  _____________________________________________________________________               Take 1/2 dose evening insulin              Eat healthy snack before bedtime.   Bring in tubing and mask from c-pap machine.     Take these medicines the morning of surgery with A SIP OF WATER: Xanax if needed,  Gabapentin, Prilosec, Effexor DO NOT TAKE ANY DIABETIC MEDICATIONS DAY OF YOUR SURGERY                               You may not have any metal on your body including hair pins and              piercings  Do not wear jewelry,  lotions, powders or perfumes, deodorant                     Men may shave face and neck.   Do not bring valuables to the hospital. [place].  Contacts, dentures or bridgework may not be worn into surgery.  Leave suitcase in the car. After surgery it may be brought to your room.      Special Instructions: coughing and deep breathing exercises, leg exercises              Please read over the following fact sheets you were given: _____________________________________________________________________             [facility] - Preparing for Surgery Before surgery, you can play an important role.  Because skin is not sterile, your skin needs to be as free of germs as possible.  You can reduce the number of germs on your skin by washing with CHG (chlorahexidine gluconate) soap before surgery.  CHG is an antiseptic cleaner which kills germs and bonds with the skin to continue killing germs even after washing. Please DO NOT use if you have an allergy to CHG or antibacterial soaps.  If your skin becomes reddened/irritated stop using the CHG and inform your nurse when you arrive at Short Stay. Do not shave (including legs and underarms) for at least 48 hours prior to the first CHG shower.  You may shave your face/neck. Please follow these instructions carefully:  1.  Shower with CHG Soap the night before surgery and the  morning of Surgery.  2.  If you choose to wash your hair, wash your hair first as usual with your  normal  shampoo.  3.  After you shampoo, rinse your hair and body thoroughly to remove the  shampoo.                           4.  Use CHG as you would any other liquid soap.  You can apply chg directly  to the  skin and wash                       Gently with a scrungie or clean washcloth.  5.  Apply the CHG Soap to your body ONLY FROM THE NECK DOWN.   Do not use on face/ open                           Wound or open sores. Avoid contact with eyes, ears mouth and genitals (private parts).                       Wash face,  Genitals (private parts) with your normal soap.             6.  Wash thoroughly, paying special attention to the area where your surgery  will be performed.  7.  Thoroughly rinse your body with warm water from the neck down.  8.  DO NOT shower/wash with your normal soap after using and rinsing off  the CHG Soap.                9.  Pat yourself dry with a clean towel.            10.  Wear clean pajamas.            11.  Place clean sheets on your bed the night of your first shower and do not  sleep with pets. Day of Surgery : Do not apply any lotions/deodorants the morning of surgery.  Please wear clean clothes to the hospital/surgery center.  FAILURE TO FOLLOW THESE INSTRUCTIONS MAY RESULT IN THE CANCELLATION OF YOUR SURGERY PATIENT SIGNATURE_________________________________  NURSE SIGNATURE__________________________________  ________________________________________________________________________  WHAT IS A BLOOD TRANSFUSION? Blood Transfusion Information  A transfusion is the replacement of blood or some of its parts. Blood is made up of multiple cells which provide different functions.  Red blood cells carry oxygen and are used for blood loss replacement.  White blood cells fight against infection.  Platelets control bleeding.  Plasma helps clot blood.  Other blood products are available for specialized needs, such as hemophilia or other clotting disorders. BEFORE THE TRANSFUSION  Who gives blood for transfusions?   Healthy volunteers who are fully evaluated to make sure their blood is safe. This is blood bank blood. Transfusion therapy is the safest it has ever been  in the practice of medicine. Before blood is taken from a donor, a complete history is taken to make sure that person has no history of diseases nor engages in risky social behavior (examples are intravenous drug use or sexual activity with multiple partners). The donor's travel history is screened to minimize risk of transmitting infections, such as malaria. The donated blood is tested for signs of infectious diseases, such as HIV and hepatitis. The blood is then tested to be sure it is compatible with you in order to minimize the chance of a transfusion reaction. If you or a relative donates blood, this is often done in anticipation of surgery and is not appropriate  for emergency situations. It takes many days to process the donated blood. RISKS AND COMPLICATIONS Although transfusion therapy is very safe and saves many lives, the main dangers of transfusion include:  1. Getting an infectious disease. 2. Developing a transfusion reaction. This is an allergic reaction to something in the blood you were given. Every precaution is taken to prevent this. The decision to have a blood transfusion has been considered carefully by your caregiver before blood is given. Blood is not given unless the benefits outweigh the risks. AFTER THE TRANSFUSION  Right after receiving a blood transfusion, you will usually feel much better and more energetic. This is especially true if your red blood cells have gotten low (anemic). The transfusion raises the level of the red blood cells which carry oxygen, and this usually causes an energy increase.  The nurse administering the transfusion will monitor you carefully for complications. HOME CARE INSTRUCTIONS  No special instructions are needed after a transfusion. You may find your energy is better. Speak with your caregiver about any limitations on activity for underlying diseases you may have. SEEK MEDICAL CARE IF:   Your condition is not improving after your  transfusion.  You develop redness or irritation at the intravenous (IV) site. SEEK IMMEDIATE MEDICAL CARE IF:  Any of the following symptoms occur over the next 12 hours:  Shaking chills.  You have a temperature by mouth above 102 F (38.9 C), not controlled by medicine.  Chest, back, or muscle pain.  People around you feel you are not acting correctly or are confused.  Shortness of breath or difficulty breathing.  Dizziness and fainting.  You get a rash or develop hives.  You have a decrease in urine output.  Your urine turns a dark color or changes to pink, red, or brown. Any of the following symptoms occur over the next 10 days:  You have a temperature by mouth above 102 F (38.9 C), not controlled by medicine.  Shortness of breath.  Weakness after normal activity.  The white part of the eye turns yellow (jaundice).  You have a decrease in the amount of urine or are urinating less often.  Your urine turns a dark color or changes to pink, red, or brown. Document Released: 08/26/2000 Document Revised: 11/21/2011 Document Reviewed: 04/14/2008 ExitCare Patient Information 2014 [place].  _______________________________________________________________________  Incentive Spirometer  An incentive spirometer is a tool that can help keep your lungs clear and active. This tool measures how well you are filling your lungs with each breath. Taking long deep breaths may help reverse or decrease the chance of developing breathing (pulmonary) problems (especially infection) following:  A long period of time when you are unable to move or be active. BEFORE THE PROCEDURE   If the spirometer includes an indicator to show your best effort, your nurse or respiratory therapist will set it to a desired goal.  If possible, sit up straight or lean slightly forward. Try not to slouch.  Hold the incentive spirometer in an upright position. INSTRUCTIONS FOR USE  3. Sit on the  edge of your bed if possible, or sit up as far as you can in bed or on a chair. 4. Hold the incentive spirometer in an upright position. 5. Breathe out normally. 6. Place the mouthpiece in your mouth and seal your lips tightly around it. 7. Breathe in slowly and as deeply as possible, raising the piston or the ball toward the top of the column. 8. Hold your breath  for 3-5 seconds or for as long as possible. Allow the piston or ball to fall to the bottom of the column. 9. Remove the mouthpiece from your mouth and breathe out normally. 10. Rest for a few seconds and repeat Steps 1 through 7 at least 10 times every 1-2 hours when you are awake. Take your time and take a few normal breaths between deep breaths. 11. The spirometer may include an indicator to show your best effort. Use the indicator as a goal to work toward during each repetition. 12. After each set of 10 deep breaths, practice coughing to be sure your lungs are clear. If you have an incision (the cut made at the time of surgery), support your incision when coughing by placing a pillow or rolled up towels firmly against it. Once you are able to get out of bed, walk around indoors and cough well. You may stop using the incentive spirometer when instructed by your caregiver.  RISKS AND COMPLICATIONS  Take your time so you do not get dizzy or light-headed.  If you are in pain, you may need to take or ask for pain medication before doing incentive spirometry. It is harder to take a deep breath if you are having pain. AFTER USE  Rest and breathe slowly and easily.  It can be helpful to keep track of a log of your progress. Your caregiver can provide you with a simple table to help with this. If you are using the spirometer at home, follow these instructions: [place] IF:   You are having difficultly using the spirometer.  You have trouble using the spirometer as often as instructed.  Your pain medication is not giving  enough relief while using the spirometer.  You develop fever of 100.5 F (38.1 C) or higher. SEEK IMMEDIATE MEDICAL CARE IF:   You cough up bloody sputum that had not been present before.  You develop fever of 102 F (38.9 C) or greater.  You develop worsening pain at or near the incision site. MAKE SURE YOU:   Understand these instructions.  Will watch your condition.  Will get help right away if you are not doing well or get worse. Document Released: 01/09/2007 Document Revised: 11/21/2011 Document Reviewed: 03/12/2007 [facility] Patient Information 2014 [place], [place].   ________________________________________________________________________

## 2015-10-26 ENCOUNTER — Encounter (HOSPITAL_COMMUNITY): Payer: Self-pay

## 2015-10-26 ENCOUNTER — Encounter (HOSPITAL_COMMUNITY)
Admission: RE | Admit: 2015-10-26 | Discharge: 2015-10-26 | Disposition: A | Discharge status: Home / Self Care | Payer: Medicare Other | Source: Ambulatory Visit | Attending: Orthopedic Surgery | Admitting: Orthopedic Surgery

## 2015-10-26 DIAGNOSIS — Z0183 Encounter for blood typing: Secondary | ICD-10-CM | POA: Insufficient documentation

## 2015-10-26 DIAGNOSIS — Z01812 Encounter for preprocedural laboratory examination: Secondary | ICD-10-CM | POA: Diagnosis not present

## 2015-10-26 DIAGNOSIS — M1712 Unilateral primary osteoarthritis, left knee: Secondary | ICD-10-CM | POA: Insufficient documentation

## 2015-10-26 HISTORY — DX: Sleep apnea, unspecified: G47.30

## 2015-10-26 HISTORY — DX: Unspecified osteoarthritis, unspecified site: M19.90

## 2015-10-26 HISTORY — DX: Peripheral vascular disease, unspecified: I73.9

## 2015-10-26 HISTORY — DX: Cardiac arrhythmia, unspecified: I49.9

## 2015-10-26 LAB — CBC
HEMATOCRIT: 50.2 % (ref 39.0–52.0)
HEMOGLOBIN: 16.7 g/dL (ref 13.0–17.0)
MCH: 29.3 pg (ref 26.0–34.0)
MCHC: 33.3 g/dL (ref 30.0–36.0)
MCV: 88.2 fL (ref 78.0–100.0)
Platelets: 635 10*3/uL — ABNORMAL HIGH (ref 150–400)
RBC: 5.69 MIL/uL (ref 4.22–5.81)
RDW: 14.8 % (ref 11.5–15.5)
WBC: 8.9 10*3/uL (ref 4.0–10.5)

## 2015-10-26 LAB — COMPREHENSIVE METABOLIC PANEL
ALK PHOS: 89 U/L (ref 38–126)
ALT: 22 U/L (ref 17–63)
ANION GAP: 9 (ref 5–15)
AST: 23 U/L (ref 15–41)
Albumin: 4.1 g/dL (ref 3.5–5.0)
BILIRUBIN TOTAL: 0.7 mg/dL (ref 0.3–1.2)
BUN: 25 mg/dL — ABNORMAL HIGH (ref 6–20)
CALCIUM: 9.4 mg/dL (ref 8.9–10.3)
CO2: 26 mmol/L (ref 22–32)
Chloride: 101 mmol/L (ref 101–111)
Creatinine, Ser: 0.84 mg/dL (ref 0.61–1.24)
Glucose, Bld: 227 mg/dL — ABNORMAL HIGH (ref 65–99)
Potassium: 4.6 mmol/L (ref 3.5–5.1)
SODIUM: 136 mmol/L (ref 135–145)
TOTAL PROTEIN: 7.2 g/dL (ref 6.5–8.1)

## 2015-10-26 LAB — APTT: aPTT: 38 seconds — ABNORMAL HIGH (ref 24–37)

## 2015-10-26 LAB — URINALYSIS, ROUTINE W REFLEX MICROSCOPIC
Bilirubin Urine: NEGATIVE
Glucose, UA: >1000 mg/dL — AB
Hgb urine dipstick: NEGATIVE
KETONES UR: NEGATIVE mg/dL
LEUKOCYTES UA: NEGATIVE
NITRITE: NEGATIVE
PROTEIN: NEGATIVE mg/dL
Specific Gravity, Urine: 1.017 (ref 1.005–1.030)
pH: 5 (ref 5.0–8.0)

## 2015-10-26 LAB — URINE MICROSCOPIC-ADD ON: RBC / HPF: NONE SEEN RBC/hpf (ref 0–5)

## 2015-10-26 LAB — SURGICAL PCR SCREEN
MRSA, PCR: NEGATIVE
Staphylococcus aureus: POSITIVE — AB

## 2015-10-26 LAB — PROTIME-INR
INR: 1.28 (ref 0.00–1.49)
PROTHROMBIN TIME: 16.2 s — AB (ref 11.6–15.2)

## 2015-10-26 LAB — ABO/RH: ABO/RH(D): O POS

## 2015-10-26 NOTE — Progress Notes (Addendum)
Surgical clearance - Dr. Forde Dandy - in chart 09-28-15 - LOV - Dr. Forde Dandy ([place]- in chart & EPIC 09-28-15 - HgA1C (7.1) - in chart  08-21-15 - VAS U/S Carotid Duplex - EPIC 08-14-15 -L OV - Dr. Tamala Julian (cardio) - EPIC  08-04-15 - EKG - EPIC 07-13-15 - OV - Dr. Forde Dandy - in chart 04-27-15 - Sleep Study - EPIC  03-11-14 - 48 hr. holter monitor - EPIC 03-11-14 - Echo - EPIC

## 2015-10-26 NOTE — Progress Notes (Signed)
10-26-15 - PTT, PT, CBC, CMP, PCR Screen, UA, UA culture lab results from preop visit on 10-26-15 faxed to Dr. Wynelle Link via EPIC.

## 2015-10-29 NOTE — H&P (Signed)
TOTAL KNEE ADMISSION H&P  Patient is being admitted for left total knee arthroplasty.  Subjective:  Chief Complaint:left knee pain.  HPI: [name], 75 y.o. male, has a history of pain and functional disability in the left knee due to arthritis and has failed non-surgical conservative treatments for greater than 12 weeks to includeNSAID's and/or analgesics, corticosteriod injections, viscosupplementation injections, flexibility and strengthening excercises and activity modification.  Onset of symptoms was gradual, starting >10 years ago with rapidlly worsening course since that time. The patient noted no past surgery on the left knee(s).  Patient currently rates pain in the left knee(s) at 8 out of 10 with activity. Patient has night pain, worsening of pain with activity and weight bearing, pain that interferes with activities of daily living, pain with passive range of motion, crepitus and joint swelling.  Patient has evidence of periarticular osteophytes and joint space narrowing by imaging studies.  There is no active infection.  Patient Active Problem List   Diagnosis Date Noted  . Hx of adenomatous colonic polyps 11/03/2014  . Paroxysmal atrial fibrillation ([place]) 11/03/2014  . Long term current use of anticoagulant therapy 11/03/2014  . Obesity (BMI 30-39.9) 07/15/2014  . HTN (hypertension) 07/15/2014  . Chronic anticoagulation 06/20/2014  . Tachycardia-bradycardia syndrome ([place]) 04/15/2014  . Atrial fibrillation, unspecified 03/04/2014  . Moderate obstructive sleep apnea 03/04/2014  . DUODENITIS WITHOUT MENTION OF HEMORRHAGE 06/24/2009  . Diabetes mellitus type 2, insulin dependent ([place]) 05/22/2009  . GERD 05/22/2009   Past Medical History  Diagnosis Date  . Diabetes mellitus without complication ([place])   . Hypertension   . Hyperlipidemia   . Obesity   . GERD (gastroesophageal reflux disease)   . Paroxysmal atrial fibrillation ([facility])   . Anxiety   . Depression   .  Neuropathy ([place])   . Dysrhythmia     a-fib  . Peripheral vascular disease ([place])     diabetic neuropathy in both feet  . Sleep apnea     uses C-pap machine  . Arthritis   . Cancer ([place])     skin - basil cell    Past Surgical History  Procedure Laterality Date  . Back surgery  00-02-12    x3  . Colonoscopy    . Knee arthroscopy  005/01/02  . Appendectomy  1962  . Basal cell carcinoma excision  93/06/10     Current outpatient prescriptions:  .  ALPRAZolam (XANAX) 0.5 MG tablet, Take 0.5 mg by mouth 2 (two) times daily. , Disp: , Rfl:  .  benazepril (LOTENSIN) 40 MG tablet, Take 40 mg by mouth daily., Disp: , Rfl:  .  Canagliflozin (INVOKANA) 100 MG TABS, Take 50 mg by mouth 2 (two) times daily. , Disp: , Rfl:  .  ergocalciferol (VITAMIN D2) 50000 UNITS capsule, Take 50,000 Units by mouth once a week., Disp: , Rfl:  .  furosemide (LASIX) 20 MG tablet, Take 20 mg by mouth daily. , Disp: , Rfl:  .  gabapentin (NEURONTIN) 300 MG capsule, Take 300 mg by mouth daily., Disp: , Rfl:  .  insulin aspart protamine-insulin aspart (NOVOLOG 70/30) (70-30) 100 UNIT/ML injection, Inject 46-48 Units into the skin 2 (two) times daily with a meal. 46 in the morning and 48 in the evening, Disp: , Rfl:  .  metFORMIN (GLUCOPHAGE) 500 MG tablet, Take 500 mg by mouth 2 (two) times daily with a meal. , Disp: , Rfl:  .  Multiple Vitamin (MULTIVITAMIN WITH MINERALS) TABS tablet, Take 1  tablet by mouth daily., Disp: , Rfl:  .  omeprazole (PRILOSEC) 20 MG capsule, Take 20 mg by mouth daily., Disp: , Rfl:  .  rivaroxaban (XARELTO) 20 MG TABS tablet, Take 1 tablet (20 mg total) by mouth daily with supper., Disp: 90 tablet, Rfl: 3 .  simvastatin (ZOCOR) 40 MG tablet, Take 40 mg by mouth daily., Disp: , Rfl:  .  venlafaxine XR (EFFEXOR-XR) 75 MG 24 hr capsule, Take 75 mg by mouth daily., Disp: , Rfl:   No Known Allergies  Social History  Substance Use Topics  . Smoking status: Former Smoker    Types: Cigars     Quit date: 08/02/1992  . Smokeless tobacco: Never Used  . Alcohol Use: 0.0 oz/week    0 Standard drinks or equivalent per week     Comment: beer     Family History  Problem Relation Age of Onset  . Cancer Mother   . Diabetes Mellitus II Mother   . Hypertension Mother   . Heart failure Father   . CVA Father   . Hypertension Sister   . Colon cancer Neg Hx      Review of Systems  Constitutional: Negative.   HENT: Negative.   Eyes: Negative.   Respiratory: Positive for shortness of breath. Negative for cough, hemoptysis, sputum production and wheezing.        SOB on exertion  Cardiovascular: Negative.   Gastrointestinal: Positive for heartburn. Negative for nausea, vomiting, abdominal pain, diarrhea, constipation, blood in stool and melena.  Genitourinary: Negative.   Musculoskeletal: Positive for myalgias, back pain and joint pain. Negative for falls and neck pain.  Skin: Negative.   Neurological: Negative.   Endo/Heme/Allergies: Negative.   Psychiatric/Behavioral: Negative for depression, suicidal ideas, hallucinations, memory loss and substance abuse. The patient is nervous/anxious. The patient does not have insomnia.     Objective:  Physical Exam  Constitutional: He is oriented to person, place, and time. He appears well-developed. No distress.  Morbidly obese  HENT:  Head: Normocephalic and atraumatic.  Right Ear: External ear normal.  Left Ear: External ear normal.  Nose: Nose normal.  Mouth/Throat: Oropharynx is clear and moist.  Eyes: Conjunctivae and EOM are normal.  Neck: Normal range of motion. Neck supple.  Cardiovascular: Normal rate, regular rhythm, normal heart sounds and intact distal pulses.   No murmur heard. Respiratory: Effort normal and breath sounds normal. No respiratory distress. He has no wheezes.  GI: Soft. Bowel sounds are normal. He exhibits no distension. There is no tenderness.  Musculoskeletal:       Right hip: Normal.       Left hip:  Normal.  His right knee shows no effusion. Range 5 to 130. There is marked crepitus on range of motion. He is tender medical greater than lateral and there is no instability noted. His left knee, no effusion, range 5 to 130, marked crepitus on range of motion, tender medial greater than lateral with no instability noted.  Neurological: He is alert and oriented to person, place, and time. He has normal strength and normal reflexes. No sensory deficit.  Skin: No rash noted. He is not diaphoretic. No erythema.  Psychiatric: He has a normal mood and affect. His behavior is normal.      Imaging Review Plain radiographs demonstrate severe degenerative joint disease of the left knee(s). The overall alignment ismild varus. The bone quality appears to be good for age and reported activity level.  Assessment/Plan:  End stage primary osteoarthritis,  left knee   The patient history, physical examination, clinical judgment of the provider and imaging studies are consistent with end stage degenerative joint disease of the left knee(s) and total knee arthroplasty is deemed medically necessary. The treatment options including medical management, injection therapy arthroscopy and arthroplasty were discussed at length. The risks and benefits of total knee arthroplasty were presented and reviewed. The risks due to aseptic loosening, infection, stiffness, patella tracking problems, thromboembolic complications and other imponderables were discussed. The patient acknowledged the explanation, agreed to proceed with the plan and consent was signed. Patient is being admitted for inpatient treatment for surgery, pain control, PT, OT, prophylactic antibiotics, VTE prophylaxis, progressive ambulation and ADL's and discharge planning. The patient is planning to be discharged home with home health services   PCP: [name] Cardio: [name]    Ardeen Jourdain, PA-C

## 2015-11-01 MED ORDER — CEFAZOLIN SODIUM 10 G IJ SOLR
3.0000 g | INTRAMUSCULAR | Status: AC
  Administered 2015-11-02: 3 g via INTRAVENOUS
  Filled 2015-11-01: qty 3000

## 2015-11-02 ENCOUNTER — Encounter (HOSPITAL_COMMUNITY)
Admission: RE | Disposition: A | Discharge status: Home Health | Payer: Self-pay | Source: Ambulatory Visit | Attending: Orthopedic Surgery

## 2015-11-02 ENCOUNTER — Inpatient Hospital Stay (HOSPITAL_COMMUNITY)
Admission: RE | Admit: 2015-11-02 | Discharge: 2015-11-05 | DRG: 470 | Disposition: A | Discharge status: Home Health | Payer: Medicare Other | Source: Ambulatory Visit | Attending: Orthopedic Surgery | Admitting: Orthopedic Surgery

## 2015-11-02 ENCOUNTER — Encounter (HOSPITAL_COMMUNITY): Payer: Self-pay | Admitting: *Deleted

## 2015-11-02 ENCOUNTER — Inpatient Hospital Stay (HOSPITAL_COMMUNITY): Payer: Medicare Other | Admitting: Certified Registered Nurse Anesthetist

## 2015-11-02 DIAGNOSIS — M25562 Pain in left knee: Secondary | ICD-10-CM | POA: Diagnosis present

## 2015-11-02 DIAGNOSIS — E1140 Type 2 diabetes mellitus with diabetic neuropathy, unspecified: Secondary | ICD-10-CM | POA: Diagnosis not present

## 2015-11-02 DIAGNOSIS — Z6839 Body mass index (BMI) 39.0-39.9, adult: Secondary | ICD-10-CM

## 2015-11-02 DIAGNOSIS — M179 Osteoarthritis of knee, unspecified: Secondary | ICD-10-CM | POA: Diagnosis present

## 2015-11-02 DIAGNOSIS — Z7901 Long term (current) use of anticoagulants: Secondary | ICD-10-CM | POA: Diagnosis not present

## 2015-11-02 DIAGNOSIS — Z79899 Other long term (current) drug therapy: Secondary | ICD-10-CM | POA: Diagnosis not present

## 2015-11-02 DIAGNOSIS — K219 Gastro-esophageal reflux disease without esophagitis: Secondary | ICD-10-CM | POA: Diagnosis present

## 2015-11-02 DIAGNOSIS — G4733 Obstructive sleep apnea (adult) (pediatric): Secondary | ICD-10-CM | POA: Diagnosis present

## 2015-11-02 DIAGNOSIS — I10 Essential (primary) hypertension: Secondary | ICD-10-CM | POA: Diagnosis present

## 2015-11-02 DIAGNOSIS — Z87891 Personal history of nicotine dependence: Secondary | ICD-10-CM | POA: Diagnosis not present

## 2015-11-02 DIAGNOSIS — I480 Paroxysmal atrial fibrillation: Secondary | ICD-10-CM | POA: Diagnosis not present

## 2015-11-02 DIAGNOSIS — M1712 Unilateral primary osteoarthritis, left knee: Secondary | ICD-10-CM | POA: Diagnosis not present

## 2015-11-02 DIAGNOSIS — R000 Tachycardia, unspecified: Secondary | ICD-10-CM | POA: Diagnosis not present

## 2015-11-02 DIAGNOSIS — E785 Hyperlipidemia, unspecified: Secondary | ICD-10-CM | POA: Diagnosis present

## 2015-11-02 DIAGNOSIS — Z01812 Encounter for preprocedural laboratory examination: Secondary | ICD-10-CM | POA: Diagnosis not present

## 2015-11-02 DIAGNOSIS — Z794 Long term (current) use of insulin: Secondary | ICD-10-CM

## 2015-11-02 DIAGNOSIS — M1710 Unilateral primary osteoarthritis, unspecified knee: Secondary | ICD-10-CM | POA: Diagnosis present

## 2015-11-02 HISTORY — PX: TOTAL KNEE ARTHROPLASTY: SHX125

## 2015-11-02 LAB — GLUCOSE, CAPILLARY
GLUCOSE-CAPILLARY: 150 mg/dL — AB (ref 65–99)
GLUCOSE-CAPILLARY: 281 mg/dL — AB (ref 65–99)
Glucose-Capillary: 137 mg/dL — ABNORMAL HIGH (ref 65–99)

## 2015-11-02 LAB — TYPE AND SCREEN
ABO/RH(D): O POS
Antibody Screen: NEGATIVE

## 2015-11-02 LAB — PROTIME-INR
INR: 1.07 (ref 0.00–1.49)
Prothrombin Time: 14.1 seconds (ref 11.6–15.2)

## 2015-11-02 SURGERY — ARTHROPLASTY, KNEE, TOTAL
Anesthesia: Monitor Anesthesia Care | Site: Knee | Laterality: Left

## 2015-11-02 MED ORDER — INSULIN ASPART PROT & ASPART (70-30 MIX) 100 UNIT/ML ~~LOC~~ SUSP
46.0000 [IU] | Freq: Every day | SUBCUTANEOUS | Status: DC
  Administered 2015-11-03 – 2015-11-05 (×3): 46 [IU] via SUBCUTANEOUS
  Filled 2015-11-02: qty 10

## 2015-11-02 MED ORDER — MIDAZOLAM HCL 2 MG/2ML IJ SOLN
INTRAMUSCULAR | Status: AC
  Filled 2015-11-02: qty 2

## 2015-11-02 MED ORDER — CANAGLIFLOZIN 100 MG PO TABS
100.0000 mg | ORAL_TABLET | Freq: Every day | ORAL | Status: DC
  Administered 2015-11-03 – 2015-11-05 (×3): 100 mg via ORAL
  Filled 2015-11-02 (×3): qty 1

## 2015-11-02 MED ORDER — ACETAMINOPHEN 10 MG/ML IV SOLN
INTRAVENOUS | Status: AC
  Filled 2015-11-02: qty 100

## 2015-11-02 MED ORDER — TRANEXAMIC ACID 1000 MG/10ML IV SOLN
1000.0000 mg | Freq: Once | INTRAVENOUS | Status: AC
  Administered 2015-11-02: 1000 mg via INTRAVENOUS
  Filled 2015-11-02: qty 10

## 2015-11-02 MED ORDER — OXYCODONE HCL 5 MG PO TABS: 5.0000 mg | ORAL_TABLET | Freq: Once | ORAL | Status: DC | PRN

## 2015-11-02 MED ORDER — ONDANSETRON HCL 4 MG/2ML IJ SOLN: 4.0000 mg | Freq: Four times a day (QID) | INTRAMUSCULAR | Status: DC | PRN

## 2015-11-02 MED ORDER — METOCLOPRAMIDE HCL 5 MG PO TABS
5.0000 mg | ORAL_TABLET | Freq: Three times a day (TID) | ORAL | Status: DC | PRN
  Filled 2015-11-02: qty 2

## 2015-11-02 MED ORDER — MORPHINE SULFATE (PF) 10 MG/ML IV SOLN: 1.0000 mg | INTRAVENOUS | Status: DC | PRN

## 2015-11-02 MED ORDER — OXYCODONE HCL 5 MG PO TABS
5.0000 mg | ORAL_TABLET | ORAL | Status: DC | PRN
  Administered 2015-11-02 – 2015-11-05 (×10): 10 mg via ORAL
  Filled 2015-11-02 (×11): qty 2

## 2015-11-02 MED ORDER — PANTOPRAZOLE SODIUM 40 MG PO TBEC
40.0000 mg | DELAYED_RELEASE_TABLET | Freq: Every day | ORAL | Status: DC
  Filled 2015-11-02: qty 1

## 2015-11-02 MED ORDER — LABETALOL HCL 5 MG/ML IV SOLN
INTRAVENOUS | Status: AC
  Filled 2015-11-02: qty 4

## 2015-11-02 MED ORDER — DEXAMETHASONE SODIUM PHOSPHATE 10 MG/ML IJ SOLN
10.0000 mg | Freq: Once | INTRAMUSCULAR | Status: AC
  Administered 2015-11-02: 10 mg via INTRAVENOUS

## 2015-11-02 MED ORDER — SODIUM CHLORIDE 0.9 % IR SOLN
Status: DC | PRN
  Administered 2015-11-02: 1000 mL

## 2015-11-02 MED ORDER — METHOCARBAMOL 500 MG PO TABS
500.0000 mg | ORAL_TABLET | Freq: Four times a day (QID) | ORAL | Status: DC | PRN
  Administered 2015-11-03 – 2015-11-05 (×4): 500 mg via ORAL
  Filled 2015-11-02 (×4): qty 1

## 2015-11-02 MED ORDER — BUPIVACAINE IN DEXTROSE 0.75-8.25 % IT SOLN
INTRATHECAL | Status: DC | PRN
  Administered 2015-11-02: 2 mL via INTRATHECAL

## 2015-11-02 MED ORDER — METHOCARBAMOL 1000 MG/10ML IJ SOLN
500.0000 mg | Freq: Four times a day (QID) | INTRAVENOUS | Status: DC | PRN
  Administered 2015-11-02: 500 mg via INTRAVENOUS
  Filled 2015-11-02 (×2): qty 5

## 2015-11-02 MED ORDER — PROPOFOL 500 MG/50ML IV EMUL
INTRAVENOUS | Status: DC | PRN
  Administered 2015-11-02: 50 ug/kg/min via INTRAVENOUS

## 2015-11-02 MED ORDER — ONDANSETRON HCL 4 MG/2ML IJ SOLN
INTRAMUSCULAR | Status: DC | PRN
  Administered 2015-11-02: 4 mg via INTRAVENOUS

## 2015-11-02 MED ORDER — METFORMIN HCL 500 MG PO TABS
500.0000 mg | ORAL_TABLET | Freq: Two times a day (BID) | ORAL | Status: DC
  Administered 2015-11-03: 500 mg via ORAL
  Filled 2015-11-02 (×3): qty 1

## 2015-11-02 MED ORDER — MIDAZOLAM HCL 5 MG/5ML IJ SOLN
INTRAMUSCULAR | Status: DC | PRN
  Administered 2015-11-02: 2 mg via INTRAVENOUS

## 2015-11-02 MED ORDER — INSULIN ASPART 100 UNIT/ML ~~LOC~~ SOLN
0.0000 [IU] | Freq: Three times a day (TID) | SUBCUTANEOUS | Status: DC
  Administered 2015-11-02: 8 [IU] via SUBCUTANEOUS
  Administered 2015-11-03: 5 [IU] via SUBCUTANEOUS
  Administered 2015-11-03: 8 [IU] via SUBCUTANEOUS
  Administered 2015-11-04: 3 [IU] via SUBCUTANEOUS
  Administered 2015-11-04 – 2015-11-05 (×2): 2 [IU] via SUBCUTANEOUS

## 2015-11-02 MED ORDER — ESMOLOL HCL 100 MG/10ML IV SOLN
INTRAVENOUS | Status: AC
  Filled 2015-11-02: qty 10

## 2015-11-02 MED ORDER — ONDANSETRON HCL 4 MG/2ML IJ SOLN
INTRAMUSCULAR | Status: AC
  Filled 2015-11-02: qty 2

## 2015-11-02 MED ORDER — DEXAMETHASONE SODIUM PHOSPHATE 10 MG/ML IJ SOLN
10.0000 mg | Freq: Once | INTRAMUSCULAR | Status: AC
  Administered 2015-11-03: 10 mg via INTRAVENOUS
  Filled 2015-11-02: qty 1

## 2015-11-02 MED ORDER — DEXAMETHASONE SODIUM PHOSPHATE 10 MG/ML IJ SOLN
INTRAMUSCULAR | Status: AC
  Filled 2015-11-02: qty 1

## 2015-11-02 MED ORDER — SIMVASTATIN 40 MG PO TABS
40.0000 mg | ORAL_TABLET | Freq: Every day | ORAL | Status: DC
  Administered 2015-11-02 – 2015-11-04 (×3): 40 mg via ORAL
  Filled 2015-11-02 (×4): qty 1

## 2015-11-02 MED ORDER — MENTHOL 3 MG MT LOZG: 1.0000 | LOZENGE | OROMUCOSAL | Status: DC | PRN

## 2015-11-02 MED ORDER — PROPOFOL 10 MG/ML IV BOLUS
INTRAVENOUS | Status: DC | PRN
  Administered 2015-11-02: 20 mg via INTRAVENOUS

## 2015-11-02 MED ORDER — FENTANYL CITRATE (PF) 100 MCG/2ML IJ SOLN: 25.0000 ug | INTRAMUSCULAR | Status: DC | PRN

## 2015-11-02 MED ORDER — ACETAMINOPHEN 10 MG/ML IV SOLN
1000.0000 mg | Freq: Once | INTRAVENOUS | Status: AC
  Administered 2015-11-02: 1000 mg via INTRAVENOUS
  Filled 2015-11-02: qty 100

## 2015-11-02 MED ORDER — INSULIN ASPART PROT & ASPART (70-30 MIX) 100 UNIT/ML ~~LOC~~ SUSP
48.0000 [IU] | Freq: Every day | SUBCUTANEOUS | Status: DC
  Administered 2015-11-02 – 2015-11-04 (×3): 48 [IU] via SUBCUTANEOUS

## 2015-11-02 MED ORDER — POLYETHYLENE GLYCOL 3350 17 G PO PACK: 17.0000 g | PACK | Freq: Every day | ORAL | Status: DC | PRN

## 2015-11-02 MED ORDER — LACTATED RINGERS IV SOLN
INTRAVENOUS | Status: DC
  Administered 2015-11-02: 1000 mL via INTRAVENOUS
  Administered 2015-11-02 (×2): via INTRAVENOUS

## 2015-11-02 MED ORDER — DOCUSATE SODIUM 100 MG PO CAPS
100.0000 mg | ORAL_CAPSULE | Freq: Two times a day (BID) | ORAL | Status: DC
  Administered 2015-11-02 – 2015-11-05 (×6): 100 mg via ORAL

## 2015-11-02 MED ORDER — FLEET ENEMA 7-19 GM/118ML RE ENEM: 1.0000 | ENEMA | Freq: Once | RECTAL | Status: DC | PRN

## 2015-11-02 MED ORDER — TRANEXAMIC ACID 1000 MG/10ML IV SOLN
1000.0000 mg | INTRAVENOUS | Status: AC
  Administered 2015-11-02: 1000 mg via INTRAVENOUS
  Filled 2015-11-02: qty 10

## 2015-11-02 MED ORDER — FENTANYL CITRATE (PF) 100 MCG/2ML IJ SOLN
INTRAMUSCULAR | Status: DC | PRN
  Administered 2015-11-02 (×2): 50 ug via INTRAVENOUS

## 2015-11-02 MED ORDER — METOCLOPRAMIDE HCL 5 MG/ML IJ SOLN: 5.0000 mg | Freq: Three times a day (TID) | INTRAMUSCULAR | Status: DC | PRN

## 2015-11-02 MED ORDER — PROPOFOL 10 MG/ML IV BOLUS
INTRAVENOUS | Status: AC
  Filled 2015-11-02: qty 60

## 2015-11-02 MED ORDER — ACETAMINOPHEN 500 MG PO TABS
1000.0000 mg | ORAL_TABLET | Freq: Four times a day (QID) | ORAL | Status: AC
  Administered 2015-11-02 – 2015-11-03 (×4): 1000 mg via ORAL
  Filled 2015-11-02 (×5): qty 2

## 2015-11-02 MED ORDER — BUPIVACAINE HCL (PF) 0.25 % IJ SOLN
INTRAMUSCULAR | Status: AC
  Filled 2015-11-02: qty 30

## 2015-11-02 MED ORDER — CEFAZOLIN SODIUM-DEXTROSE 2-3 GM-% IV SOLR
2.0000 g | Freq: Four times a day (QID) | INTRAVENOUS | Status: AC
  Administered 2015-11-02 (×2): 2 g via INTRAVENOUS
  Filled 2015-11-02 (×2): qty 50

## 2015-11-02 MED ORDER — STERILE WATER FOR IRRIGATION IR SOLN
Status: DC | PRN
  Administered 2015-11-02: 2000 mL

## 2015-11-02 MED ORDER — BISACODYL 10 MG RE SUPP: 10.0000 mg | Freq: Every day | RECTAL | Status: DC | PRN

## 2015-11-02 MED ORDER — BUPIVACAINE HCL 0.25 % IJ SOLN
INTRAMUSCULAR | Status: DC | PRN
  Administered 2015-11-02: 20 mL

## 2015-11-02 MED ORDER — SODIUM CHLORIDE 0.9 % IJ SOLN
INTRAMUSCULAR | Status: AC
  Filled 2015-11-02: qty 50

## 2015-11-02 MED ORDER — SODIUM CHLORIDE 0.9 % IV SOLN
INTRAVENOUS | Status: DC
  Administered 2015-11-02 (×2): via INTRAVENOUS

## 2015-11-02 MED ORDER — SODIUM CHLORIDE 0.9 % IJ SOLN
INTRAMUSCULAR | Status: DC | PRN
Start: 2015-11-02 — End: 2015-11-02
  Administered 2015-11-02: 30 mL

## 2015-11-02 MED ORDER — ESMOLOL HCL 100 MG/10ML IV SOLN
INTRAVENOUS | Status: DC | PRN
  Administered 2015-11-02 (×2): 10 mg via INTRAVENOUS

## 2015-11-02 MED ORDER — RIVAROXABAN 10 MG PO TABS
10.0000 mg | ORAL_TABLET | Freq: Every day | ORAL | Status: DC
  Administered 2015-11-03 – 2015-11-05 (×3): 10 mg via ORAL
  Filled 2015-11-02 (×4): qty 1

## 2015-11-02 MED ORDER — CHLORHEXIDINE GLUCONATE 4 % EX LIQD: 60.0000 mL | Freq: Once | CUTANEOUS | Status: DC

## 2015-11-02 MED ORDER — FENTANYL CITRATE (PF) 100 MCG/2ML IJ SOLN
INTRAMUSCULAR | Status: AC
  Filled 2015-11-02: qty 2

## 2015-11-02 MED ORDER — SODIUM CHLORIDE 0.9 % IV SOLN: INTRAVENOUS | Status: DC

## 2015-11-02 MED ORDER — DIPHENHYDRAMINE HCL 12.5 MG/5ML PO ELIX: 12.5000 mg | ORAL_SOLUTION | ORAL | Status: DC | PRN

## 2015-11-02 MED ORDER — BUPIVACAINE LIPOSOME 1.3 % IJ SUSP
20.0000 mL | Freq: Once | INTRAMUSCULAR | Status: DC
  Filled 2015-11-02: qty 20

## 2015-11-02 MED ORDER — MORPHINE SULFATE (PF) 2 MG/ML IV SOLN
2.0000 mg | INTRAVENOUS | Status: DC | PRN
  Administered 2015-11-02 (×2): 2 mg via INTRAVENOUS
  Filled 2015-11-02 (×2): qty 1

## 2015-11-02 MED ORDER — 0.9 % SODIUM CHLORIDE (POUR BTL) OPTIME
TOPICAL | Status: DC | PRN
  Administered 2015-11-02: 1000 mL

## 2015-11-02 MED ORDER — INSULIN ASPART PROT & ASPART (70-30 MIX) 100 UNIT/ML ~~LOC~~ SUSP: 46.0000 [IU] | Freq: Two times a day (BID) | SUBCUTANEOUS | Status: DC

## 2015-11-02 MED ORDER — ALPRAZOLAM 0.5 MG PO TABS
0.5000 mg | ORAL_TABLET | Freq: Two times a day (BID) | ORAL | Status: DC
  Administered 2015-11-02 – 2015-11-05 (×6): 0.5 mg via ORAL
  Filled 2015-11-02 (×6): qty 1

## 2015-11-02 MED ORDER — ACETAMINOPHEN 650 MG RE SUPP: 650.0000 mg | Freq: Four times a day (QID) | RECTAL | Status: DC | PRN

## 2015-11-02 MED ORDER — OXYCODONE HCL 5 MG/5ML PO SOLN: 5.0000 mg | Freq: Once | ORAL | Status: DC | PRN

## 2015-11-02 MED ORDER — ONDANSETRON HCL 4 MG PO TABS: 4.0000 mg | ORAL_TABLET | Freq: Four times a day (QID) | ORAL | Status: DC | PRN

## 2015-11-02 MED ORDER — LABETALOL HCL 5 MG/ML IV SOLN
10.0000 mg | INTRAVENOUS | Status: DC | PRN
  Administered 2015-11-02: 10 mg via INTRAVENOUS

## 2015-11-02 MED ORDER — FUROSEMIDE 20 MG PO TABS
20.0000 mg | ORAL_TABLET | Freq: Every day | ORAL | Status: DC
  Administered 2015-11-03 – 2015-11-05 (×3): 20 mg via ORAL
  Filled 2015-11-02 (×3): qty 1

## 2015-11-02 MED ORDER — GABAPENTIN 300 MG PO CAPS
300.0000 mg | ORAL_CAPSULE | Freq: Every day | ORAL | Status: DC
  Administered 2015-11-02 – 2015-11-05 (×4): 300 mg via ORAL
  Filled 2015-11-02 (×4): qty 1

## 2015-11-02 MED ORDER — BUPIVACAINE LIPOSOME 1.3 % IJ SUSP
INTRAMUSCULAR | Status: DC | PRN
  Administered 2015-11-02: 20 mL

## 2015-11-02 MED ORDER — VENLAFAXINE HCL ER 75 MG PO CP24
75.0000 mg | ORAL_CAPSULE | Freq: Every day | ORAL | Status: DC
  Administered 2015-11-03 – 2015-11-05 (×3): 75 mg via ORAL
  Filled 2015-11-02 (×3): qty 1

## 2015-11-02 MED ORDER — PHENOL 1.4 % MT LIQD: 1.0000 | OROMUCOSAL | Status: DC | PRN

## 2015-11-02 MED ORDER — ACETAMINOPHEN 325 MG PO TABS: 650.0000 mg | ORAL_TABLET | Freq: Four times a day (QID) | ORAL | Status: DC | PRN

## 2015-11-02 SURGICAL SUPPLY — 50 items
BAG DECANTER FOR FLEXI CONT (MISCELLANEOUS) ×2 IMPLANT
BAG ZIPLOCK 12X15 (MISCELLANEOUS) ×2 IMPLANT
BANDAGE ACE 6X5 VEL STRL LF (GAUZE/BANDAGES/DRESSINGS) ×2 IMPLANT
BLADE SAG 18X100X1.27 (BLADE) ×2 IMPLANT
BLADE SAW SGTL 11.0X1.19X90.0M (BLADE) ×2 IMPLANT
BOWL SMART MIX CTS (DISPOSABLE) ×2 IMPLANT
CAP KNEE TOTAL 3 SIGMA ×2 IMPLANT
CEMENT HV SMART SET (Cement) ×4 IMPLANT
CLOTH BEACON ORANGE TIMEOUT ST (SAFETY) ×2 IMPLANT
CUFF TOURN SGL QUICK 34 (TOURNIQUET CUFF) ×1
CUFF TRNQT CYL 34X4X40X1 (TOURNIQUET CUFF) ×1 IMPLANT
DECANTER SPIKE VIAL GLASS SM (MISCELLANEOUS) ×2 IMPLANT
DRAPE U-SHAPE 47X51 STRL (DRAPES) ×2 IMPLANT
DRSG ADAPTIC 3X8 NADH LF (GAUZE/BANDAGES/DRESSINGS) ×2 IMPLANT
DRSG PAD ABDOMINAL 8X10 ST (GAUZE/BANDAGES/DRESSINGS) ×2 IMPLANT
DURAPREP 26ML APPLICATOR (WOUND CARE) ×2 IMPLANT
ELECT REM PT RETURN 9FT ADLT (ELECTROSURGICAL) ×2
ELECTRODE REM PT RTRN 9FT ADLT (ELECTROSURGICAL) ×1 IMPLANT
EVACUATOR 1/8 PVC DRAIN (DRAIN) ×2 IMPLANT
GAUZE SPONGE 4X4 12PLY STRL (GAUZE/BANDAGES/DRESSINGS) ×2 IMPLANT
GLOVE BIO SURGEON STRL SZ 6.5 (GLOVE) ×2 IMPLANT
GLOVE BIO SURGEON STRL SZ8 (GLOVE) ×2 IMPLANT
GLOVE BIOGEL PI IND STRL 6.5 (GLOVE) ×2 IMPLANT
GLOVE BIOGEL PI IND STRL 7.5 (GLOVE) ×1 IMPLANT
GLOVE BIOGEL PI IND STRL 8 (GLOVE) ×1 IMPLANT
GLOVE BIOGEL PI INDICATOR 6.5 (GLOVE) ×2
GLOVE BIOGEL PI INDICATOR 7.5 (GLOVE) ×1
GLOVE BIOGEL PI INDICATOR 8 (GLOVE) ×1
GLOVE SURG SS PI 6.5 STRL IVOR (GLOVE) ×2 IMPLANT
GLOVE SURG SS PI 7.5 STRL IVOR (GLOVE) ×2 IMPLANT
GOWN STRL REUS W/TWL LRG LVL3 (GOWN DISPOSABLE) ×4 IMPLANT
GOWN STRL REUS W/TWL XL LVL3 (GOWN DISPOSABLE) ×4 IMPLANT
HANDPIECE INTERPULSE COAX TIP (DISPOSABLE) ×1
IMMOBILIZER KNEE 20 (SOFTGOODS) ×2
IMMOBILIZER KNEE 20 THIGH 36 (SOFTGOODS) ×1 IMPLANT
MANIFOLD NEPTUNE II (INSTRUMENTS) ×2 IMPLANT
PACK TOTAL KNEE CUSTOM (KITS) ×2 IMPLANT
PAD ABD 8X10 STRL (GAUZE/BANDAGES/DRESSINGS) ×2 IMPLANT
PADDING CAST COTTON 6X4 STRL (CAST SUPPLIES) ×4 IMPLANT
POSITIONER SURGICAL ARM (MISCELLANEOUS) ×2 IMPLANT
SET HNDPC FAN SPRY TIP SCT (DISPOSABLE) ×1 IMPLANT
STRIP CLOSURE SKIN 1/2X4 (GAUZE/BANDAGES/DRESSINGS) ×4 IMPLANT
SUT MNCRL AB 4-0 PS2 18 (SUTURE) ×2 IMPLANT
SUT VIC AB 2-0 CT1 27 (SUTURE) ×3
SUT VIC AB 2-0 CT1 TAPERPNT 27 (SUTURE) ×3 IMPLANT
SUT VLOC 180 0 24IN GS25 (SUTURE) ×2 IMPLANT
SYR 50ML LL SCALE MARK (SYRINGE) ×2 IMPLANT
TRAY FOLEY W/METER SILVER 16FR (SET/KITS/TRAYS/PACK) ×2 IMPLANT
WRAP KNEE MAXI GEL POST OP (GAUZE/BANDAGES/DRESSINGS) ×2 IMPLANT
YANKAUER SUCT BULB TIP 10FT TU (MISCELLANEOUS) ×2 IMPLANT

## 2015-11-02 NOTE — Progress Notes (Signed)
Utilization review completed.  

## 2015-11-02 NOTE — Interval H&P Note (Signed)
History and Physical Interval Note:  11/02/2015 7:18 AM  [name]  has presented today for surgery, with the diagnosis of OA LEFT KNEE   The various methods of treatment have been discussed with the patient and family. After consideration of risks, benefits and other options for treatment, the patient has consented to  Procedure(s): TOTAL LEFT KNEE ARTHROPLASTY (Left) as a surgical intervention .  The patient's history has been reviewed, patient examined, no change in status, stable for surgery.  I have reviewed the patient's chart and labs.  Questions were answered to the patient's satisfaction.     [name]

## 2015-11-02 NOTE — Op Note (Signed)
Pre-operative diagnosis- Osteoarthritis  Left knee(s)  Post-operative diagnosis- Osteoarthritis Left knee(s)  Procedure-  Left  Total Knee Arthroplasty  Surgeon- Dione Plover. Teryl Mcconaghy, MD  Assistant- Ardeen Jourdain, PA-C   Anesthesia-  Spinal  EBL-* No blood loss amount entered *   Drains Hemovac  Tourniquet time- 41 minutes @ [id number] mm Hg  Complications- None  Condition-PACU - hemodynamically stable.   Brief Clinical Note   [name] is a 75 y.o. year old male with end stage OA of his left knee with progressively worsening pain and dysfunction. He has constant pain, with activity and at rest and significant functional deficits with difficulties even with ADLs. He has had extensive non-op management including analgesics, injections of cortisone and viscosupplements, and home exercise program, but remains in significant pain with significant dysfunction. Radiographs show bone on bone arthritis medial and patellofemoral. He presents now for left Total Knee Arthroplasty.     Procedure in detail---   The patient is brought into the operating room and positioned supine on the operating table. After successful administration of  Spinal,   a tourniquet is placed high on the  Left thigh(s) and the lower extremity is prepped and draped in the usual sterile fashion. Time out is performed by the operating team and then the  Left lower extremity is wrapped in Esmarch, knee flexed and the tourniquet inflated to 300 mmHg.       A midline incision is made with a ten blade through the subcutaneous tissue to the level of the extensor mechanism. A fresh blade is used to make a medial parapatellar arthrotomy. Soft tissue over the proximal medial tibia is subperiosteally elevated to the joint line with a knife and into the semimembranosus bursa with a Cobb elevator. Soft tissue over the proximal lateral tibia is elevated with attention being paid to avoiding the patellar tendon on the tibial tubercle. The  patella is everted, knee flexed 90 degrees and the ACL and PCL are removed. Findings are bone on bone medial and patellofemoral with massive global osteophytes.        The drill is used to create a starting hole in the distal femur and the canal is thoroughly irrigated with sterile saline to remove the fatty contents. The 5 degree Left  valgus alignment guide is placed into the femoral canal and the distal femoral cutting block is pinned to remove 10 mm off the distal femur. Resection is made with an oscillating saw.      The tibia is subluxed forward and the menisci are removed. The extramedullary alignment guide is placed referencing proximally at the medial aspect of the tibial tubercle and distally along the second metatarsal axis and tibial crest. The block is pinned to remove 43mm off the more deficient medial  side. Resection is made with an oscillating saw. Size 4is the most appropriate size for the tibia and the proximal tibia is prepared with the modular drill and keel punch for that size.      The femoral sizing guide is placed and size 5 is most appropriate. Rotation is marked off the epicondylar axis and confirmed by creating a rectangular flexion gap at 90 degrees. The size 5 cutting block is pinned in this rotation and the anterior, posterior and chamfer cuts are made with the oscillating saw. The intercondylar block is then placed and that cut is made.      Trial size 4 tibial component, trial size 5 posterior stabilized femur and a 12.5  mm  posterior stabilized rotating platform insert trial is placed. Full extension is achieved with excellent varus/valgus and anterior/posterior balance throughout full range of motion. The patella is everted and thickness measured to be 27  mm. Free hand resection is taken to 15 mm, a 38 template is placed, lug holes are drilled, trial patella is placed, and it tracks normally. Osteophytes are removed off the posterior femur with the trial in place. All trials  are removed and the cut bone surfaces prepared with pulsatile lavage. Cement is mixed and once ready for implantation, the size 4 tibial implant, size  5 posterior stabilized femoral component, and the size 38 patella are cemented in place and the patella is held with the clamp. The trial insert is placed and the knee held in full extension. The Exparel (20 ml mixed with 30 ml saline) and .25% Bupivicaine, are injected into the extensor mechanism, posterior capsule, medial and lateral gutters and subcutaneous tissues.  All extruded cement is removed and once the cement is hard the permanent 12.5 mm posterior stabilized rotating platform insert is placed into the tibial tray.      The wound is copiously irrigated with saline solution and the extensor mechanism closed over a hemovac drain with #1 V-loc suture. The tourniquet is released for a total tourniquet time of 41  minutes. Flexion against gravity is 140 degrees and the patella tracks normally. Subcutaneous tissue is closed with 2.0 vicryl and subcuticular with running 4.0 Monocryl. The incision is cleaned and dried and steri-strips and a bulky sterile dressing are applied. The limb is placed into a knee immobilizer and the patient is awakened and transported to recovery in stable condition.      Please note that a surgical assistant was a medical necessity for this procedure in order to perform it in a safe and expeditious manner. Surgical assistant was necessary to retract the ligaments and vital neurovascular structures to prevent injury to them and also necessary for proper positioning of the limb to allow for anatomic placement of the prosthesis.   Dione Plover Debby Clyne, MD    11/02/2015, 10:04 AM

## 2015-11-02 NOTE — Evaluation (Signed)
Physical Therapy Evaluation Patient Details Name: [name] MRN: [id number] DOB: Nov 11, 1940 Today's Date: 11/02/2015   History of Present Illness  L TKA  Clinical Impression  Pt is s/p TKA resulting in the deficits listed below (see PT Problem List). +2 assist for bed to recliner, pt had LOB x 2 while walking with RW, pt reported this was due to B peripheral neuropathy. Pt became nauseous in standing. Performed L TKA exercises with assist.  Pt will benefit from skilled PT to increase their independence and safety with mobility to allow discharge to the venue listed below.      Follow Up Recommendations Home health PT    Equipment Recommendations  None recommended by PT    Recommendations for Other Services OT consult     Precautions / Restrictions        Mobility  Bed Mobility Overal bed mobility: Needs Assistance Bed Mobility: Rolling;Sidelying to Sit Rolling: Supervision Sidelying to sit: Supervision       General bed mobility comments: verbal cues for technique, pt has h/o 3 prior back surgeries so rolled to R side for OOB  Transfers Overall transfer level: Needs assistance Equipment used: Rolling walker (2 wheeled) Transfers: Sit to/from [facility] Sit to Stand: +2 safety/equipment;Min assist Stand pivot transfers: Mod assist;+2 safety/equipment;+2 physical assistance       General transfer comment: +2 assist for balance in standing, pt had anterior LOB x 2 requiring physical assist while taking pivotal steps to recliner, pt stated B peripheral neuropathy throws  him off balance, pt denies h/o falls  Ambulation/Gait Ambulation/Gait assistance: +2 physical assistance;+2 safety/equipment;Mod assist Ambulation Distance (Feet): 3 Feet Assistive device: Rolling walker (2 wheeled) Gait Pattern/deviations: Step-to pattern;Antalgic;Trunk flexed     General Gait Details: +2 assist for balance in standing, pt had anterior LOB x 2 requiring  physical assist while taking pivotal steps to recliner, pt stated B peripheral neuropathy throws  him off balance, pt denies h/o falls, verbal cues for sequencing  Stairs            Wheelchair Mobility    Modified Rankin (Stroke Patients Only)       Balance Overall balance assessment: Needs assistance   Sitting balance-Leahy Scale: Good     Standing balance support: Bilateral upper extremity supported Standing balance-Leahy Scale: Poor Standing balance comment: LOB anteriorly x 2 with walking with RW                             Pertinent Vitals/Pain Pain Assessment: 0-10 Pain Score: 6  Pain Location: L knee Pain Descriptors / Indicators: Sore Pain Intervention(s): Premedicated before session;Monitored during session;Limited activity within patient's tolerance;Ice applied    Home Living Family/patient expects to be discharged to:: Private residence Living Arrangements: Spouse/significant other   Type of Home: House Home Access: Stairs to enter Entrance Stairs-Rails: Right Entrance Stairs-Number of Steps: 2 Home Layout: Multi-level Home Equipment: Walker - 4 wheels;Bedside commode;Shower seat      Prior Function Level of Independence: Independent with assistive device(s)         Comments: used walking stick     Hand Dominance        Extremity/Trunk Assessment   Upper Extremity Assessment: Overall WFL for tasks assessed           Lower Extremity Assessment: LLE deficits/detail   LLE Deficits / Details: 0-35* AAROM L knee, SLR 3/5, neuropathy B feet, pt reported sensation to light  touch intact  Cervical / Trunk Assessment: Normal  Communication   Communication: No difficulties  Cognition Arousal/Alertness: Awake/alert Behavior During Therapy: WFL for tasks assessed/performed Overall Cognitive Status: Within Functional Limits for tasks assessed                      General Comments      Exercises Total Joint  Exercises Ankle Circles/Pumps: AROM;Both;10 reps Quad Sets: AROM;Both;5 reps;Supine Heel Slides: AAROM;Left;10 reps Straight Leg Raises: AROM;Left;5 reps Long Arc Quad: AROM;Left;5 reps;Seated      Assessment/Plan    PT Assessment Patient needs continued PT services  PT Diagnosis Difficulty walking;Acute pain   PT Problem List Decreased range of motion;Decreased strength;Decreased activity tolerance;Decreased balance;Pain;Decreased knowledge of use of DME;Decreased mobility;Obesity  PT Treatment Interventions DME instruction;Gait training;Stair training;Functional mobility training;Therapeutic activities;Patient/family education;Therapeutic exercise;Balance training   PT Goals (Current goals can be found in the Care Plan section) Acute Rehab PT Goals Patient Stated Goal: [place] PT Goal Formulation: With patient Time For Goal Achievement: 11/09/15 Potential to Achieve Goals: Good    Frequency 7X/week   Barriers to discharge        Co-evaluation               End of Session Equipment Utilized During Treatment: Gait belt Activity Tolerance: Treatment limited secondary to medical complications (Comment) (pt nauseous in standing, RN notified) Patient left: in chair;with call bell/phone within reach Nurse Communication: Mobility status         Time: [id number] PT Time Calculation (min) (ACUTE ONLY): 42 min   Charges:   PT Evaluation $PT Eval Low Complexity: 1 Procedure PT Treatments $Gait Training: 8-22 mins $Therapeutic Exercise: 8-22 mins   PT G Codes:        Philomena Doheny 11/02/2015, 5:05 PM [phone]

## 2015-11-02 NOTE — Progress Notes (Signed)
Patient has had a recent head cold Temp 98.3.  Denies coughing or congestion

## 2015-11-02 NOTE — Anesthesia Procedure Notes (Signed)
Spinal Patient location during procedure: OR Start time: 11/02/2015 8:53 AM End time: 11/02/2015 8:58 AM Staffing Anesthesiologist: Marcie Bal, ADAM Resident/CRNA: Darlys Gales R Performed by: resident/CRNA  Preanesthetic Checklist Completed: patient identified, site marked, surgical consent, pre-op evaluation, timeout performed, IV checked, risks and benefits discussed and monitors and equipment checked Spinal Block Patient position: sitting Prep: Betadine Patient monitoring: heart rate, continuous pulse ox and blood pressure Approach: midline Location: L3-4 Injection technique: single-shot Needle Needle type: Spinocan  Needle gauge: 22 G Needle length: 9 cm Needle insertion depth: 8 cm Assessment Sensory level: T6 Additional Notes Expiration date of kit checked and confirmed. Patient tolerated procedure well, without complications. Lot [id number] exp 2017-01-09

## 2015-11-02 NOTE — Anesthesia Preprocedure Evaluation (Signed)
Anesthesia Evaluation  Patient identified by MRN, date of birth, ID band Patient awake    Reviewed: Allergy & Precautions, NPO status , Patient's Chart, lab work & pertinent test results  Airway Mallampati: II   Neck ROM: full    Dental   Pulmonary sleep apnea , former smoker,    breath sounds clear to auscultation       Cardiovascular hypertension, + Peripheral Vascular Disease  + dysrhythmias Atrial Fibrillation  Rhythm:regular Rate:Normal     Neuro/Psych Anxiety Depression    GI/Hepatic GERD  ,  Endo/Other  diabetes, Type 2obese  Renal/GU      Musculoskeletal  (+) Arthritis ,   Abdominal   Peds  Hematology   Anesthesia Other Findings   Reproductive/Obstetrics                             Anesthesia Physical Anesthesia Plan  ASA: III  Anesthesia Plan: MAC and Spinal   Post-op Pain Management:    Induction: Intravenous  Airway Management Planned: Simple Face Mask  Additional Equipment:   Intra-op Plan:   Post-operative Plan:   Informed Consent: I have reviewed the patients History and Physical, chart, labs and discussed the procedure including the risks, benefits and alternatives for the proposed anesthesia with the patient or authorized representative who has indicated his/her understanding and acceptance.     Plan Discussed with: CRNA, Anesthesiologist and Surgeon  Anesthesia Plan Comments:         Anesthesia Quick Evaluation

## 2015-11-02 NOTE — Anesthesia Postprocedure Evaluation (Signed)
Anesthesia Post Note  Patient: [name]  Procedure(s) Performed: Procedure(s) (LRB): TOTAL LEFT KNEE ARTHROPLASTY (Left)  Patient location during evaluation: PACU Anesthesia Type: Spinal Level of consciousness: oriented and awake and alert Pain management: pain level controlled Vital Signs Assessment: post-procedure vital signs reviewed and stable Respiratory status: spontaneous breathing, respiratory function stable and patient connected to nasal cannula oxygen Cardiovascular status: blood pressure returned to baseline and stable Postop Assessment: no headache and no backache Anesthetic complications: no    Last Vitals:  Filed Vitals:   11/02/15 1159 11/02/15 1213  BP: 123/76 117/68  Pulse:  92  Temp: 36.5 C 36.8 C  Resp:  12    Last Pain:  Filed Vitals:   11/02/15 1213  PainSc: 0-No pain                 Babs Dabbs S

## 2015-11-02 NOTE — Transfer of Care (Addendum)
Immediate Anesthesia Transfer of Care Note  Patient: [name]  Procedure(s) Performed: Procedure(s): TOTAL LEFT KNEE ARTHROPLASTY (Left)  Patient Location: PACU  Anesthesia Type:Spinal  Level of Consciousness:  sedated, patient cooperative and responds to stimulation  Airway & Oxygen Therapy:Patient Spontanous Breathing and Patient connected to face mask oxgen  Post-op Assessment:  Report given to PACU RN and Post -op Vital signs reviewed and stable  Post vital signs:  Reviewed and stable, spinal level T12  Last Vitals:  Filed Vitals:   11/02/15 0648  BP: 149/81  Pulse: 103  Temp: 36.8 C  Resp: 18    Complications: No apparent anesthesia complications

## 2015-11-03 LAB — BASIC METABOLIC PANEL
ANION GAP: 7 (ref 5–15)
BUN: 22 mg/dL — ABNORMAL HIGH (ref 6–20)
CO2: 25 mmol/L (ref 22–32)
Calcium: 9.2 mg/dL (ref 8.9–10.3)
Chloride: 107 mmol/L (ref 101–111)
Creatinine, Ser: 0.82 mg/dL (ref 0.61–1.24)
GLUCOSE: 214 mg/dL — AB (ref 65–99)
POTASSIUM: 4.6 mmol/L (ref 3.5–5.1)
Sodium: 139 mmol/L (ref 135–145)

## 2015-11-03 LAB — CBC
HEMATOCRIT: 42.6 % (ref 39.0–52.0)
Hemoglobin: 13.7 g/dL (ref 13.0–17.0)
MCH: 29 pg (ref 26.0–34.0)
MCHC: 32.2 g/dL (ref 30.0–36.0)
MCV: 90.1 fL (ref 78.0–100.0)
PLATELETS: 581 10*3/uL — AB (ref 150–400)
RBC: 4.73 MIL/uL (ref 4.22–5.81)
RDW: 15 % (ref 11.5–15.5)
WBC: 12.6 10*3/uL — AB (ref 4.0–10.5)

## 2015-11-03 LAB — GLUCOSE, CAPILLARY
GLUCOSE-CAPILLARY: 187 mg/dL — AB (ref 65–99)
Glucose-Capillary: 277 mg/dL — ABNORMAL HIGH (ref 65–99)
Glucose-Capillary: 244 mg/dL — ABNORMAL HIGH (ref 65–99)
Glucose-Capillary: 299 mg/dL — ABNORMAL HIGH (ref 65–99)
Glucose-Capillary: 245 mg/dL — ABNORMAL HIGH (ref 65–99)

## 2015-11-03 MED ORDER — METHOCARBAMOL 500 MG PO TABS: 500.0000 mg | ORAL_TABLET | Freq: Four times a day (QID) | ORAL | Status: DC | PRN

## 2015-11-03 MED ORDER — BENAZEPRIL HCL 40 MG PO TABS
40.0000 mg | ORAL_TABLET | Freq: Every day | ORAL | Status: DC
  Administered 2015-11-03 – 2015-11-05 (×3): 40 mg via ORAL
  Filled 2015-11-03 (×3): qty 1

## 2015-11-03 MED ORDER — OMEPRAZOLE 20 MG PO CPDR
20.0000 mg | DELAYED_RELEASE_CAPSULE | Freq: Every day | ORAL | Status: DC
  Administered 2015-11-04 – 2015-11-05 (×2): 20 mg via ORAL
  Filled 2015-11-03 (×3): qty 1

## 2015-11-03 MED ORDER — OXYCODONE HCL 5 MG PO TABS: 5.0000 mg | ORAL_TABLET | ORAL | Status: DC | PRN

## 2015-11-03 NOTE — Progress Notes (Signed)
   Subjective: 1 Day Post-Op Procedure(s) (LRB): TOTAL LEFT KNEE ARTHROPLASTY (Left) Patient reports pain as mild.   Patient seen in rounds with Dr. Wynelle Link. Doing okay this morning. Patient is well, but has had some minor complaints of pain in the knee, requiring pain medications We will resume therapy today.  He walked about 3 feet yesterday following surgery. Plan is to go Home after hospital stay.  Objective: Vital signs in last 24 hours: Temp:  [97.7 F (36.5 C)-98.8 F (37.1 C)] 98.8 F (37.1 C) (02/21 0600) Pulse Rate:  [59-108] 62 (02/21 0600) Resp:  [10-20] 18 (02/21 0600) BP: (111-158)/(53-102) 158/68 mmHg (02/21 0600) SpO2:  [94 %-100 %] 100 % (02/21 0600)  Intake/Output from previous day:  Intake/Output Summary (Last 24 hours) at 11/03/15 0800 Last data filed at 11/03/15 0604  Gross per 24 hour  Intake   5010 ml  Output   4395 ml  Net    615 ml    Intake/Output this shift: UOP 700 since around [place]  Labs:  Recent Labs  11/03/15 0420  HGB 13.7    Recent Labs  11/03/15 0420  WBC 12.6*  RBC 4.73  HCT 42.6  PLT 581*    Recent Labs  11/03/15 0420  NA 139  K 4.6  CL 107  CO2 25  BUN 22*  CREATININE 0.82  GLUCOSE 214*  CALCIUM 9.2    Recent Labs  11/02/15 0650  INR 1.07    EXAM General - Patient is Alert, Appropriate and Oriented Extremity - Neurovascular intact Sensation intact distally Dorsiflexion/Plantar flexion intact Dressing - dressing C/D/I Motor Function - intact, moving foot and toes well on exam.  Hemovac pulled without difficulty.  Past Medical History  Diagnosis Date  . Diabetes mellitus without complication ([place])   . Hypertension   . Hyperlipidemia   . Obesity   . GERD (gastroesophageal reflux disease)   . Paroxysmal atrial fibrillation ([facility])   . Anxiety   . Depression   . Neuropathy ([place])   . Dysrhythmia     a-fib  . Peripheral vascular disease ([place])     diabetic neuropathy in both feet  . Sleep apnea    uses C-pap machine  . Arthritis   . 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   Weight as of this encounter: 128.822 kg (284 lb). Advance diet Up with therapy Plan for discharge tomorrow Discharge home with home health  Elevated blood pressure.  Uses benazepril.  Restart but with parameters.  Lasix has been restarted. Also has Xanax twice a day  DVT Prophylaxis - Xarelto Weight-Bearing as tolerated to left leg D/C O2 and Pulse OX and try on Room Air  Charles Muslim, PA-C Orthopaedic Surgery 11/03/2015, 8:00 AM

## 2015-11-03 NOTE — Progress Notes (Signed)
Patient's heart rate at 120 at this time, immediately post PT session. Patient back in bed, pulse down to mid to upper 90s, to low 100's. Will continue to monitor.

## 2015-11-03 NOTE — Progress Notes (Signed)
Hear rate fluctuating from mid to upper 80s at this time. Will continue to monitor.

## 2015-11-03 NOTE — Evaluation (Signed)
Occupational Therapy Evaluation Patient Details Name: [name] MRN: [id number] DOB: 08/07/1941 Today's Date: 11/03/2015    History of Present Illness L TKA   Clinical Impression   Patient presenting with decreased ADL and functional mobility independence secondary to above. Patient independent PTA. Patient currently functioning at an overall min to mod assist level. Patient will benefit from acute OT to increase overall independence in the areas of ADLs, functional mobility, and overall safety in order to safely discharge home with assistance from wife.     Follow Up Recommendations  No OT follow up;Supervision/Assistance - 24 hour    Equipment Recommendations  3 in 1 bedside comode    Recommendations for Other Services  None at this time   Precautions / Restrictions Precautions Precautions: Knee;Fall Precaution Comments: reveiwed knee precautions and no pillow under knee  Required Braces or Orthoses: Knee Immobilizer - Left Knee Immobilizer - Left: Discontinue once straight leg raise with < 10 degree lag Restrictions Weight Bearing Restrictions: Yes LLE Weight Bearing: Weight bearing as tolerated    Mobility Bed Mobility Overal bed mobility: Needs Assistance Bed Mobility: Rolling;Sidelying to Sit Rolling: Supervision Sidelying to sit: Min assist       General bed mobility comments: Assistance needed for management of LLE   Transfers Overall transfer level: Needs assistance Equipment used: Rolling walker (2 wheeled) Transfers: Sit to/from Stand Sit to Stand: Min assist         General transfer comment: Min assist for safety and cues for hand placement & safety    Balance Overall balance assessment: Needs assistance Sitting-balance support: No upper extremity supported;Feet supported Sitting balance-Leahy Scale: Good     Standing balance support: Bilateral upper extremity supported;During functional activity Standing balance-Leahy Scale: Fair Standing  balance comment: reliance on UB support using RW    ADL Overall ADL's : Needs assistance/impaired Eating/Feeding: Set up;Sitting   Grooming: Min guard;Standing   Upper Body Bathing: Set up;Sitting   Lower Body Bathing: Moderate assistance;Sit to/from stand   Upper Body Dressing : Set up;Sitting   Lower Body Dressing: Moderate assistance;Sit to/from stand   Toilet Transfer: Minimal assistance;RW;Grab bars;BSC;Ambulation   Toileting- Clothing Manipulation and Hygiene: Moderate assistance;Sit to/from Nurse, children's Details (indicate cue type and reason): did not occur, pt refused practicing Functional mobility during ADLs: Minimal assistance;Rolling walker;Cueing for safety General ADL Comments: Pt requires min assist for safety at this time with functional mobility using RW. Pt requires assistance with LB ADLs due to recent surgery, pt states wife can assist prn with this. Pt required moderate encouragement to work with therapist, but refused practicing walk-in shower transfer, would like to practice this tomorrow.     Pertinent Vitals/Pain Pain Assessment: Faces Faces Pain Scale: Hurts even more Pain Location: left knee with mobility and movement  Pain Descriptors / Indicators: Sore;Aching Pain Intervention(s): Limited activity within patient's tolerance;Monitored during session;Repositioned     Hand Dominance Right   Extremity/Trunk Assessment Upper Extremity Assessment Upper Extremity Assessment: Overall WFL for tasks assessed   Lower Extremity Assessment Lower Extremity Assessment: Defer to PT evaluation   Cervical / Trunk Assessment Cervical / Trunk Assessment: Normal   Communication Communication Communication: No difficulties   Cognition Arousal/Alertness: Awake/alert Behavior During Therapy: WFL for tasks assessed/performed Overall Cognitive Status: Within Functional Limits for tasks assessed              Home Living Family/patient expects  to be discharged to:: Private residence Living Arrangements: Spouse/significant other Available Help  at Discharge: Family;Available 24 hours/day Type of Home: House Home Access: Stairs to enter [employer] of Steps: 2 Entrance Stairs-Rails: Right Home Layout: Multi-level Alternate Level Stairs-Number of Steps: 7 steps up and 7 steps down from main level, bedroom on lowest level Alternate Level Stairs-Rails: Right Bathroom Shower/Tub: Walk-in shower;Door   [employer] Toilet: Standard     Home Equipment: Environmental consultant - 4 wheels;Toilet riser   Prior Functioning/Environment Level of Independence: Independent with assistive device(s)  Comments: used walking stick    OT Diagnosis: Generalized weakness;Acute pain   OT Problem List: Decreased strength;Decreased range of motion;Decreased activity tolerance;Impaired balance (sitting and/or standing);Decreased safety awareness;Decreased knowledge of use of DME or AE;Decreased knowledge of precautions;Pain;Obesity   OT Treatment/Interventions: Self-care/ADL training;Therapeutic exercise;Energy conservation;DME and/or AE instruction;Therapeutic activities;Patient/family education;Balance training    OT Goals(Current goals can be found in the care plan section) Acute Rehab OT Goals Patient Stated Goal: go home tomorrow OT Goal Formulation: With patient Time For Goal Achievement: 11/10/15 Potential to Achieve Goals: Good ADL Goals Pt Will Perform Grooming: with supervision;standing Pt Will Perform Lower Body Bathing: with min assist;sit to/from stand Pt Will Perform Lower Body Dressing: with min assist;sit to/from stand Pt Will Transfer to Toilet: with supervision;ambulating;bedside commode Pt Will Perform Tub/Shower Transfer: Shower transfer;ambulating;rolling walker;3 in 1;with supervision Additional ADL Goal #1: pt will be supervision with functional ambulation/mobility during ADL   OT Frequency: Min 2X/week   Barriers to D/C: none  known at this time   End of Session Equipment Utilized During Treatment: Gait belt;Rolling walker;Left knee immobilizer CPM Left Knee CPM Left Knee: Off Nurse Communication: Mobility status;Other (comment) (recommendation for 3-n-1 and ambulating to/from BR prn)  Activity Tolerance: Patient tolerated treatment well Patient left: in bed;with call bell/phone within reach   Time: [id number] OT Time Calculation (min): 27 min Charges:  OT General Charges $OT Visit: 1 Procedure OT Evaluation $OT Eval Low Complexity: 1 Procedure OT Treatments $Self Care/Home Management : 8-22 mins  Chrys Racer , MS, OTR/L, CLT Pager: [phone]  11/03/2015, 12:23 PM

## 2015-11-03 NOTE — Progress Notes (Signed)
Physical Therapy Treatment Patient Details Name: [name] MRN: [id number] DOB: 04-May-1941 Today's Date: 11/03/2015    History of Present Illness L TKA    PT Comments    POD # 1 am session. Applied KI and instructed on use for amb and stairs.  Assisted OOB to amb a greater distance then returned to room to perform TKR TE's followed by ICE.   Follow Up Recommendations  Home health PT     Equipment Recommendations  None recommended by PT (pt plans to borrow a walker.  sees not point getting one to use a short period. )    Recommendations for Other Services       Precautions / Restrictions Precautions Precautions: Knee;Fall Precaution Comments: instructed on KI use for amb and stairs Required Braces or Orthoses: Knee Immobilizer - Left Knee Immobilizer - Left: Discontinue once straight leg raise with < 10 degree lag Restrictions Weight Bearing Restrictions: No LLE Weight Bearing: Weight bearing as tolerated    Mobility  Bed Mobility Overal bed mobility: Needs Assistance Bed Mobility: Supine to Sit Rolling: Supervision Sidelying to sit: Min assist Supine to sit: Min assist     General bed mobility comments: Assistance needed for management of LLE   Transfers Overall transfer level: Needs assistance Equipment used: Rolling walker (2 wheeled) Transfers: Sit to/from Stand Sit to Stand: Min assist;Min guard         General transfer comment: Min assist for safety and cues for hand placement & safety  Ambulation/Gait Ambulation/Gait assistance: +2 safety/equipment;Min guard;Min assist Ambulation Distance (Feet): 40 Feet Assistive device: Rolling walker (2 wheeled)   Gait velocity: decreased   General Gait Details: + 2 for safety pt was able to rise off elevated bed.  50% VC's on proper hand placement and turn completion.  75% VC's to extend L LE prior to sit and place hands back to control decend.    Stairs            Wheelchair Mobility     Modified Rankin (Stroke Patients Only)       Balance Overall balance assessment: Needs assistance Sitting-balance support: No upper extremity supported;Feet supported Sitting balance-Leahy Scale: Good     Standing balance support: Bilateral upper extremity supported;During functional activity Standing balance-Leahy Scale: Fair Standing balance comment: reliance on UB support using RW                    Cognition Arousal/Alertness: Awake/alert Behavior During Therapy: WFL for tasks assessed/performed Overall Cognitive Status: Within Functional Limits for tasks assessed                      Exercises      General Comments        Pertinent Vitals/Pain Pain Assessment: 0-10 Pain Score: 3  Faces Pain Scale: Hurts even more Pain Location: L knee Pain Descriptors / Indicators: Sore;Tender Pain Intervention(s): Monitored during session;Premedicated before session;Repositioned;Ice applied    Home Living Family/patient expects to be discharged to:: Private residence Living Arrangements: Spouse/significant other Available Help at Discharge: Family;Available 24 hours/day Type of Home: House Home Access: Stairs to enter Entrance Stairs-Rails: Right Home Layout: Multi-level Home Equipment: Walker - 4 wheels;Toilet riser      Prior Function Level of Independence: Independent with assistive device(s)      Comments: used walking stick   PT Goals (current goals can now be found in the care plan section) Acute Rehab PT Goals Patient Stated Goal: go home tomorrow  Progress towards PT goals: Progressing toward goals    Frequency  7X/week    PT Plan Current plan remains appropriate    Co-evaluation             End of Session Equipment Utilized During Treatment: Gait belt Activity Tolerance: Patient tolerated treatment well Patient left: in chair;with call bell/phone within reach     Time: 0840-0920 PT Time Calculation (min) (ACUTE ONLY): 40  min  Charges:  $Gait Training: 8-22 mins $Therapeutic Exercise: 8-22 mins $Therapeutic Activity: 8-22 mins                    G Codes:      Rica Koyanagi  PTA [facility]  Acute  Rehab Pager      [phone]

## 2015-11-03 NOTE — Discharge Summary (Signed)
Physician Discharge Summary   [name] MRN: [id number] DOB/AGE: 1941/08/04 75 y.o.  Admit date: 11/02/2015 Discharge date: 11-05-2015  Primary Diagnosis:  Osteoarthritis Left knee(s)  Admission Diagnoses:  Past Medical History  Diagnosis Date  . Diabetes mellitus without complication ([place])   . Hypertension   . Hyperlipidemia   . Obesity   . GERD (gastroesophageal reflux disease)   . Paroxysmal atrial fibrillation ([facility])   . Anxiety   . Depression   . Neuropathy ([place])   . Dysrhythmia     a-fib  . Peripheral vascular disease ([place])     diabetic neuropathy in both feet  . Sleep apnea     uses C-pap machine  . Arthritis   . 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   Weight as of this encounter: 128.822 kg (284 lb).  Procedure:  Procedure(s) (LRB): TOTAL LEFT KNEE ARTHROPLASTY (Left)   Consults: cardiology  HPI: [name] is a 75 y.o. year old male with end stage OA of his left knee with progressively worsening pain and dysfunction. He has constant pain, with activity and at rest and significant functional deficits with difficulties even with ADLs. He has had extensive non-op management including analgesics, injections of cortisone and viscosupplements, and home exercise program, but remains in significant pain with significant dysfunction. Radiographs show bone on bone arthritis medial and patellofemoral. He presents now for left Total Knee Arthroplasty.   Laboratory Data: Admission on 11/02/2015  Component Date Value Ref Range Status  . Prothrombin Time 11/02/2015 14.1  11.6 - 15.2 seconds Final  . INR 11/02/2015 1.07  0.00 - 1.49 Final  . Glucose-Capillary 11/02/2015 150* 65 - 99 mg/dL Final  . Comment 1 11/02/2015 Notify RN   Final  .  Glucose-Capillary 11/02/2015 137* 65 - 99 mg/dL Final  . WBC 11/03/2015 12.6* 4.0 - 10.5 K/uL Final  . RBC 11/03/2015 4.73  4.22 - 5.81 MIL/uL Final  . Hemoglobin 11/03/2015 13.7  13.0 - 17.0 g/dL Final  . HCT 11/03/2015 42.6  39.0 - 52.0 % Final  . MCV 11/03/2015 90.1  78.0 - 100.0 fL Final  . MCH 11/03/2015 29.0  26.0 - 34.0 pg Final  . MCHC 11/03/2015 32.2  30.0 - 36.0 g/dL Final  . RDW 11/03/2015 15.0  11.5 - 15.5 % Final  . Platelets 11/03/2015 581* 150 - 400 K/uL Final  . Sodium 11/03/2015 139  135 - 145 mmol/L Final  . Potassium 11/03/2015 4.6  3.5 - 5.1 mmol/L Final  . Chloride 11/03/2015 107  101 - 111 mmol/L Final  . CO2 11/03/2015 25  22 - 32 mmol/L Final  . Glucose, Bld 11/03/2015 214* 65 - 99 mg/dL Final  . BUN 11/03/2015 22* 6 - 20 mg/dL Final  . Creatinine, Ser 11/03/2015 0.82  0.61 - 1.24 mg/dL Final  . Calcium 11/03/2015 9.2  8.9 - 10.3 mg/dL Final  . GFR calc non Af Amer 11/03/2015 >60  >60 mL/min Final  . GFR calc Af Amer 11/03/2015 >60  >60 mL/min Final   Comment: (NOTE) The eGFR has been calculated using the CKD EPI equation. This calculation has not been validated in all clinical situations. eGFR's persistently <60 mL/min signify possible Chronic Kidney Disease.   Georgiann Hahn  gap 11/03/2015 7  5 - 15 Final  . Glucose-Capillary 11/02/2015 281* 65 - 99 mg/dL Final  . Comment 1 11/02/2015 Notify RN   Final  . Comment 2 11/02/2015 Document in Chart   Final  . Glucose-Capillary 11/02/2015 277* 65 - 99 mg/dL Final  . Comment 1 11/02/2015 Notify RN   Final  . Comment 2 11/02/2015 Document in Chart   Final  . Glucose-Capillary 11/03/2015 187* 65 - 99 mg/dL Final  . Glucose-Capillary 11/03/2015 244* 65 - 99 mg/dL Final  . WBC 11/04/2015 15.1* 4.0 - 10.5 K/uL Final  . RBC 11/04/2015 4.75  4.22 - 5.81 MIL/uL Final  . Hemoglobin 11/04/2015 13.8  13.0 - 17.0 g/dL Final  . HCT 11/04/2015 42.5  39.0 - 52.0 % Final  . MCV 11/04/2015 89.5  78.0 - 100.0 fL Final  . MCH  11/04/2015 29.1  26.0 - 34.0 pg Final  . MCHC 11/04/2015 32.5  30.0 - 36.0 g/dL Final  . RDW 11/04/2015 15.1  11.5 - 15.5 % Final  . Platelets 11/04/2015 636* 150 - 400 K/uL Final  . Sodium 11/04/2015 140  135 - 145 mmol/L Final  . Potassium 11/04/2015 4.2  3.5 - 5.1 mmol/L Final  . Chloride 11/04/2015 104  101 - 111 mmol/L Final  . CO2 11/04/2015 27  22 - 32 mmol/L Final  . Glucose, Bld 11/04/2015 137* 65 - 99 mg/dL Final  . BUN 11/04/2015 25* 6 - 20 mg/dL Final  . Creatinine, Ser 11/04/2015 0.79  0.61 - 1.24 mg/dL Final  . Calcium 11/04/2015 9.4  8.9 - 10.3 mg/dL Final  . GFR calc non Af Amer 11/04/2015 >60  >60 mL/min Final  . GFR calc Af Amer 11/04/2015 >60  >60 mL/min Final   Comment: (NOTE) The eGFR has been calculated using the CKD EPI equation. This calculation has not been validated in all clinical situations. eGFR's persistently <60 mL/min signify possible Chronic Kidney Disease.   . Anion gap 11/04/2015 9  5 - 15 Final  . Glucose-Capillary 11/03/2015 299* 65 - 99 mg/dL Final  . Glucose-Capillary 11/03/2015 245* 65 - 99 mg/dL Final  . Comment 1 11/03/2015 Notify RN   Final  . Glucose-Capillary 11/04/2015 139* 65 - 99 mg/dL Final  . Comment 1 11/04/2015 Notify RN   Final  . Glucose-Capillary 11/04/2015 179* 65 - 99 mg/dL Final  . WBC 11/05/2015 13.4* 4.0 - 10.5 K/uL Final  . RBC 11/05/2015 5.10  4.22 - 5.81 MIL/uL Final  . Hemoglobin 11/05/2015 14.8  13.0 - 17.0 g/dL Final  . HCT 11/05/2015 45.2  39.0 - 52.0 % Final  . MCV 11/05/2015 88.6  78.0 - 100.0 fL Final  . MCH 11/05/2015 29.0  26.0 - 34.0 pg Final  . MCHC 11/05/2015 32.7  30.0 - 36.0 g/dL Final  . RDW 11/05/2015 14.9  11.5 - 15.5 % Final  . Platelets 11/05/2015 623* 150 - 400 K/uL Final  . Glucose-Capillary 11/04/2015 270* 65 - 99 mg/dL Final  . Comment 1 11/04/2015 Notify RN   Final  . Comment 2 11/04/2015 Document in Chart   Final  . Glucose-Capillary 11/05/2015 128* 65 - 99 mg/dL Final  Hospital Outpatient  Visit on 10/26/2015  Component Date Value Ref Range Status  . aPTT 10/26/2015 38* 24 - 37 seconds Final   Comment:        IF BASELINE aPTT IS ELEVATED, SUGGEST PATIENT RISK ASSESSMENT BE USED TO DETERMINE APPROPRIATE ANTICOAGULANT THERAPY.   . WBC 10/26/2015 8.9  4.0 - 10.5 K/uL Final  . RBC 10/26/2015 5.69  4.22 - 5.81 MIL/uL Final  . Hemoglobin 10/26/2015 16.7  13.0 - 17.0 g/dL Final  . HCT 10/26/2015 50.2  39.0 - 52.0 % Final  . MCV 10/26/2015 88.2  78.0 - 100.0 fL Final  . MCH 10/26/2015 29.3  26.0 - 34.0 pg Final  . MCHC 10/26/2015 33.3  30.0 - 36.0 g/dL Final  . RDW 10/26/2015 14.8  11.5 - 15.5 % Final  . Platelets 10/26/2015 635* 150 - 400 K/uL Final  . Sodium 10/26/2015 136  135 - 145 mmol/L Final  . Potassium 10/26/2015 4.6  3.5 - 5.1 mmol/L Final  . Chloride 10/26/2015 101  101 - 111 mmol/L Final  . CO2 10/26/2015 26  22 - 32 mmol/L Final  . Glucose, Bld 10/26/2015 227* 65 - 99 mg/dL Final  . BUN 10/26/2015 25* 6 - 20 mg/dL Final  . Creatinine, Ser 10/26/2015 0.84  0.61 - 1.24 mg/dL Final  . Calcium 10/26/2015 9.4  8.9 - 10.3 mg/dL Final  . Total Protein 10/26/2015 7.2  6.5 - 8.1 g/dL Final  . Albumin 10/26/2015 4.1  3.5 - 5.0 g/dL Final  . AST 10/26/2015 23  15 - 41 U/L Final  . ALT 10/26/2015 22  17 - 63 U/L Final  . Alkaline Phosphatase 10/26/2015 89  38 - 126 U/L Final  . Total Bilirubin 10/26/2015 0.7  0.3 - 1.2 mg/dL Final  . GFR calc non Af Amer 10/26/2015 >60  >60 mL/min Final  . GFR calc Af Amer 10/26/2015 >60  >60 mL/min Final   Comment: (NOTE) The eGFR has been calculated using the CKD EPI equation. This calculation has not been validated in all clinical situations. eGFR's persistently <60 mL/min signify possible Chronic Kidney Disease.   . Anion gap 10/26/2015 9  5 - 15 Final  . Prothrombin Time 10/26/2015 16.2* 11.6 - 15.2 seconds Final  . INR 10/26/2015 1.28  0.00 - 1.49 Final  . ABO/RH(D) 10/26/2015 O POS   Final  . Antibody Screen 10/26/2015 NEG    Final  . Sample Expiration 10/26/2015 11/05/2015   Final  . Extend sample reason 10/26/2015 NO TRANSFUSIONS OR PREGNANCY IN THE PAST 3 MONTHS   Final  . Color, Urine 10/26/2015 YELLOW  YELLOW Final  . APPearance 10/26/2015 CLEAR  CLEAR Final  . Specific Gravity, Urine 10/26/2015 1.017  1.005 - 1.030 Final  . pH 10/26/2015 5.0  5.0 - 8.0 Final  . Glucose, UA 10/26/2015 >1000* NEGATIVE mg/dL Final  . Hgb urine dipstick 10/26/2015 NEGATIVE  NEGATIVE Final  . Bilirubin Urine 10/26/2015 NEGATIVE  NEGATIVE Final  . Ketones, ur 10/26/2015 NEGATIVE  NEGATIVE mg/dL Final  . Protein, ur 10/26/2015 NEGATIVE  NEGATIVE mg/dL Final  . Nitrite 10/26/2015 NEGATIVE  NEGATIVE Final  . Leukocytes, UA 10/26/2015 NEGATIVE  NEGATIVE Final  . MRSA, PCR 10/26/2015 NEGATIVE  NEGATIVE Final  . Staphylococcus aureus 10/26/2015 POSITIVE* NEGATIVE Final   Comment:        The Xpert SA Assay (FDA approved for NASAL specimens in patients over 52 years of age), is one component of a comprehensive surveillance program.  Test performance has been validated by [facility] for patients greater than or equal to 51 year old. It is not intended to diagnose infection nor to guide or monitor treatment.   . Squamous Epithelial / LPF 10/26/2015 0-5* NONE SEEN Final  . WBC, UA 10/26/2015 0-5  0 - 5 WBC/hpf Final  . RBC /  HPF 10/26/2015 NONE SEEN  0 - 5 RBC/hpf Final  . Bacteria, UA 10/26/2015 RARE* NONE SEEN Final  . ABO/RH(D) 10/26/2015 O POS   Final     X-Rays:No results found.  EKG: Orders placed or performed during the hospital encounter of 11/02/15  . EKG 12-Lead  . EKG 12-Lead     Hospital Course: [name] is a 75 y.o. who was admitted to [facility]. They were brought to the operating room on 11/02/2015 and underwent Procedure(s): TOTAL LEFT KNEE ARTHROPLASTY.  Patient tolerated the procedure well and was later transferred to the recovery room and then to the orthopaedic floor for postoperative  care.  They were given PO and IV analgesics for pain control following their surgery.  They were given 24 hours of postoperative antibiotics of  Anti-infectives    Start     Dose/Rate Route Frequency Ordered Stop   11/02/15 1530  ceFAZolin (ANCEF) IVPB 2 g/50 mL premix     2 g 100 mL/hr over 30 Minutes Intravenous Every 6 hours 11/02/15 1207 11/02/15 2151   11/02/15 0600  ceFAZolin (ANCEF) 3 g in dextrose 5 % 50 mL IVPB     3 g 130 mL/hr over 30 Minutes Intravenous On call to O.R. 11/01/15 1322 11/02/15 0910     and started on DVT prophylaxis in the form of Xarelto 10 mg dosing postop (on 20 mg at home).   PT and OT were ordered for total joint protocol.  Discharge planning consulted to help with postop disposition and equipment needs.  Patient had a decent night on the evening of surgery. He walked about 3 feet the day of surgery.  They started to get up OOB with therapy on day one. Elevated blood pressure not ed on day one. Used benazepril at home. Restart but with parameters. Lasix has been restarted. Hemovac drain was pulled without difficulty.  Heart rate went up into the 120's with therapy but slowed back to 80's without intervention of day one. Continued to work with therapy into day two.  Heart rate shot up again close to 150 with therapy.  Patient felt weak and tired.  Dressing was changed on day two and the incision was healing well.  Due to the intermittent bouts of fast rates and symptoms, Dr. Wynelle Link requested Cardiology consult.  Wanted to see if patient needed any intervention or medication changes before going home especially due to having intermittent symptoms along with the runs of tachycardia.  Seen by Cardiology.  Assessment & Plan: 1. Tachycardia: Pt has a hx of paroxysmal atrial fib. Was noted to have a fast HR earlier but now he is clearly feeling better. This may have been related to pain . He is not on tele.  He is very stable and can be discharged to home tomorrow.  No additional medications needed at this time Follow up with Dr. Tamala Julian in the office 2. Paroxysmal atrial fib; Will start the Xarelto 20 mg a day tomorrow  3.obstructive sleep apnea. : Has CPAP  4. DM - per medical doctor  5. Hyperlipidemia: contineu simva  6. HTN: Continue benazepril  Thayer Headings, Brooke Bonito., MD, [facility] 11/04/2015, 6:05 PM By day three, the patient had progressed with therapy and meeting their goals.  Incision was healing well.  Patient was seen in rounds and was ready to go home following therapy.  Discharge home with home health Diet - Cardiac diet, diabetic diet Follow up - in 2 weeks on Thursday 11/19/2015  Follow up with Dr. Tamala Julian. His office will call for appointment. Activity - WBAT Disposition - Home Condition Upon Discharge - Stable D/C Meds - See DC Summary DVT Prophylaxis - Xarelto 20 mg at home, already has meds at home.      Discharge Instructions    Call MD / Call 911    Complete by:  As directed   If you experience chest pain or shortness of breath, CALL 911 and be transported to the hospital emergency room.  If you develope a fever above 101 F, pus (white drainage) or increased drainage or redness at the wound, or calf pain, call your surgeon's office.     Change dressing    Complete by:  As directed   Change dressing daily with sterile 4 x 4 inch gauze dressing and apply TED hose. Do not submerge the incision under water.     Constipation Prevention    Complete by:  As directed   Drink plenty of fluids.  Prune juice may be helpful.  You may use a stool softener, such as Colace (over the counter) 100 mg twice a day.  Use MiraLax (over the counter) for constipation as needed.     Diet - low sodium heart healthy    Complete by:  As directed      Diet Carb Modified    Complete by:  As directed      Discharge instructions    Complete by:  As directed   Pick up stool softner and laxative for home use following surgery while on pain medications. Do  not submerge incision under water. Please use good hand washing techniques while changing dressing each day. May shower starting three days after surgery. Please use a clean towel to pat the incision dry following showers. Continue to use ice for pain and swelling after surgery. Do not use any lotions or creams on the incision until instructed by your surgeon.  Resume the Xarelto 20 mg dosing at home at discharge.  Postoperative Constipation Protocol  Constipation - defined medically as fewer than three stools per week and severe constipation as less than one stool per week.  One of the most common issues patients have following surgery is constipation.  Even if you have a regular bowel pattern at home, your normal regimen is likely to be disrupted due to multiple reasons following surgery.  Combination of anesthesia, postoperative narcotics, change in appetite and fluid intake all can affect your bowels.  In order to avoid complications following surgery, here are some recommendations in order to help you during your recovery period.  Colace (docusate) - Pick up an over-the-counter form of Colace or another stool softener and take twice a day as long as you are requiring postoperative pain medications.  Take with a full glass of water daily.  If you experience loose stools or diarrhea, hold the colace until you stool forms back up.  If your symptoms do not get better within 1 week or if they get worse, check with your doctor.  Dulcolax (bisacodyl) - Pick up over-the-counter and take as directed by the product packaging as needed to assist with the movement of your bowels.  Take with a full glass of water.  Use this product as needed if not relieved by Colace only.   MiraLax (polyethylene glycol) - Pick up over-the-counter to have on hand.  MiraLax is a solution that will increase the amount of water in your bowels to assist with bowel movements.  Take  as directed and can mix with a glass of  water, juice, soda, coffee, or tea.  Take if you go more than two days without a movement. Do not use MiraLax more than once per day. Call your doctor if you are still constipated or irregular after using this medication for 7 days in a row.  If you continue to have problems with postoperative constipation, please contact the office for further assistance and recommendations.  If you experience "the worst abdominal pain ever" or develop nausea or vomiting, please contact the office immediatly for further recommendations for treatment.     Do not put a pillow under the knee. Place it under the heel.    Complete by:  As directed      Do not sit on low chairs, stoools or toilet seats, as it may be difficult to get up from low surfaces    Complete by:  As directed      Driving restrictions    Complete by:  As directed   No driving until released by the physician.     Increase activity slowly as tolerated    Complete by:  As directed      Lifting restrictions    Complete by:  As directed   No lifting until released by the physician.     Patient may shower    Complete by:  As directed   You may shower without a dressing once there is no drainage.  Do not wash over the wound.  If drainage remains, do not shower until drainage stops.     TED hose    Complete by:  As directed   Use stockings (TED hose) for 3 weeks on both leg(s).  You may remove them at night for sleeping.     Weight bearing as tolerated    Complete by:  As directed   Laterality:  left  Extremity:  Lower            Medication List    STOP taking these medications        ergocalciferol 50000 units capsule  Commonly known as:  VITAMIN D2     multivitamin with minerals Tabs tablet      TAKE these medications        ALPRAZolam 0.5 MG tablet  Commonly known as:  XANAX  Take 0.5 mg by mouth 2 (two) times daily.     benazepril 40 MG tablet  Commonly known as:  LOTENSIN  Take 40 mg by mouth daily.     furosemide 20  MG tablet  Commonly known as:  LASIX  Take 20 mg by mouth daily.     gabapentin 300 MG capsule  Commonly known as:  NEURONTIN  Take 300 mg by mouth daily.     insulin aspart protamine- aspart (70-30) 100 UNIT/ML injection  Commonly known as:  NOVOLOG MIX 70/30  Inject 46-48 Units into the skin 2 (two) times daily with a meal. 46 in the morning and 48 in the evening     INVOKANA 100 MG Tabs tablet  Generic drug:  canagliflozin  Take 50 mg by mouth 2 (two) times daily.     metFORMIN 500 MG tablet  Commonly known as:  GLUCOPHAGE  Take 500 mg by mouth 2 (two) times daily with a meal.     methocarbamol 500 MG tablet  Commonly known as:  ROBAXIN  Take 1 tablet (500 mg total) by mouth every 6 (six) hours as needed for muscle  spasms.     omeprazole 20 MG capsule  Commonly known as:  PRILOSEC  Take 20 mg by mouth daily.     oxyCODONE 5 MG immediate release tablet  Commonly known as:  Oxy IR/ROXICODONE  Take 1-2 tablets (5-10 mg total) by mouth every 3 (three) hours as needed for moderate pain or severe pain.     rivaroxaban 20 MG Tabs tablet  Commonly known as:  XARELTO  Take 1 tablet (20 mg total) by mouth daily with supper.     simvastatin 40 MG tablet  Commonly known as:  ZOCOR  Take 40 mg by mouth daily.     venlafaxine XR 75 MG 24 hr capsule  Commonly known as:  EFFEXOR-XR  Take 75 mg by mouth daily.       Follow-up Information    Follow up with [name], MD In 2 weeks.   Specialty:  Orthopedic Surgery   Why:  Call office at [phone] to setup appointment on Tuesday 11/19/2015 with Dr. Wynelle Link.   Contact information:   [address] [place] [address] [phone]       Follow up with [facility].   Why:  home health physical therapy   Contact information:   [address] [address] [phone]       Follow up with [facility].   Why:  3n1   Contact information:   [address] [place] [address] [phone]       Follow up with [name], MD.   Specialty:  Cardiology   Why:  Office will call   Contact information:   [address] [place] [address] [phone]       Signed: Arlee Muslim, PA-C Orthopaedic Surgery 11/05/2015, 8:22 AM

## 2015-11-03 NOTE — Progress Notes (Signed)
Physical Therapy Treatment Patient Details Name: [name] MRN: [id number] DOB: July 19, 1941 Today's Date: 11/03/2015    History of Present Illness L TKA    PT Comments    POD # 1 pm session.  Noted resting HR prior to session range 113 to 129.  Notified RN.  Applied KI and assisted OOB to amb a greater distance noted HR increased to 131 to 155 range.  Pt asymptomatic with known Hx A Fib.  Notified RN.  Assisted back to bed for CPM.    Follow Up Recommendations  Home health PT     Equipment Recommendations  None recommended by PT (pt plans to borrow a walker.  sees not point getting one to use a short period. )    Recommendations for Other Services       Precautions / Restrictions Precautions Precautions: Knee;Fall Precaution Comments: instructed on KI use for amb and stairs Required Braces or Orthoses: Knee Immobilizer - Left Knee Immobilizer - Left: Discontinue once straight leg raise with < 10 degree lag Restrictions Weight Bearing Restrictions: No LLE Weight Bearing: Weight bearing as tolerated    Mobility  Bed Mobility Overal bed mobility: Needs Assistance Bed Mobility: Supine to Sit Rolling: Supervision Sidelying to sit: Min assist Supine to sit: Min assist     General bed mobility comments: assisted OOB and back into bed with assist L LE   Transfers Overall transfer level: Needs assistance Equipment used: Rolling walker (2 wheeled) Transfers: Sit to/from Stand Sit to Stand: Min assist;Min guard         General transfer comment: Min assist for safety and cues for hand placement & safety  Ambulation/Gait Ambulation/Gait assistance: +2 safety/equipment;Min guard;Min assist Ambulation Distance (Feet): 85 Feet Assistive device: Rolling walker (2 wheeled)   Gait velocity: decreased   General Gait Details: noted resting HR irregular from 113 to 129 at rest prior to session.  Notified RN.  Then HR increaseed to 155 with range from 131 during gait.  RN  notified.     Stairs            Wheelchair Mobility    Modified Rankin (Stroke Patients Only)       Balance Overall balance assessment: Needs assistance Sitting-balance support: No upper extremity supported;Feet supported Sitting balance-Leahy Scale: Good     Standing balance support: Bilateral upper extremity supported;During functional activity Standing balance-Leahy Scale: Fair Standing balance comment: reliance on UB support using RW                    Cognition Arousal/Alertness: Awake/alert Behavior During Therapy: WFL for tasks assessed/performed Overall Cognitive Status: Within Functional Limits for tasks assessed                      Exercises      General Comments        Pertinent Vitals/Pain Pain Assessment: 0-10 Pain Score: 3  Faces Pain Scale: Hurts even more Pain Location: L knee Pain Descriptors / Indicators: Sore;Tender Pain Intervention(s): Monitored during session;Premedicated before session;Repositioned;Ice applied    Home Living Family/patient expects to be discharged to:: Private residence Living Arrangements: Spouse/significant other Available Help at Discharge: Family;Available 24 hours/day Type of Home: House Home Access: Stairs to enter Entrance Stairs-Rails: Right Home Layout: Multi-level Home Equipment: Walker - 4 wheels;Toilet riser      Prior Function Level of Independence: Independent with assistive device(s)      Comments: used walking stick   PT Goals (current  goals can now be found in the care plan section) Acute Rehab PT Goals Patient Stated Goal: go home tomorrow Progress towards PT goals: Progressing toward goals    Frequency  7X/week    PT Plan Current plan remains appropriate    Co-evaluation             End of Session Equipment Utilized During Treatment: Gait belt Activity Tolerance: Patient tolerated treatment well Patient left: in chair;with call bell/phone within reach      Time: 1355-1410 PT Time Calculation (min) (ACUTE ONLY): 15 min  Charges:  $Gait Training: 8-22 mins                   G Codes:      Rica Koyanagi  PTA [facility]  Acute  Rehab Pager      [phone]

## 2015-11-03 NOTE — Discharge Instructions (Addendum)
° °Dr. Frank Aluisio °Total Joint Specialist °[facility] °[address] °[place], [place] [address] °[phone] ° °TOTAL KNEE REPLACEMENT POSTOPERATIVE DIRECTIONS ° °Knee Rehabilitation, Guidelines Following Surgery  °Results after knee surgery are often greatly improved when you follow the exercise, range of motion and muscle strengthening exercises prescribed by your doctor. Safety measures are also important to protect the knee from further injury. Any time any of these exercises cause you to have increased pain or swelling in your knee joint, decrease the amount until you are comfortable again and slowly increase them. If you have problems or questions, call your caregiver or physical therapist for advice.  ° °HOME CARE INSTRUCTIONS  °Remove items at home which could result in a fall. This includes throw rugs or furniture in walking pathways.  °· ICE to the affected knee every three hours for 30 minutes at a time and then as needed for pain and swelling.  Continue to use ice on the knee for pain and swelling from surgery. You may notice swelling that will progress down to the foot and ankle.  This is normal after surgery.  Elevate the leg when you are not up walking on it.   °· Continue to use the breathing machine which will help keep your temperature down.  It is common for your temperature to cycle up and down following surgery, especially at night when you are not up moving around and exerting yourself.  The breathing machine keeps your lungs expanded and your temperature down. °· Do not place pillow under knee, focus on keeping the knee straight while resting ° °DIET °You may resume your previous home diet once your are discharged from the hospital. ° °DRESSING / WOUND CARE / SHOWERING °You may shower 3 days after surgery, but keep the wounds dry during showering.  You may use an occlusive plastic wrap (Press'n Seal for example), NO SOAKING/SUBMERGING IN THE BATHTUB.  If the  bandage gets wet, change with a clean dry gauze.  If the incision gets wet, pat the wound dry with a clean towel. °You may start showering once you are discharged home but do not submerge the incision under water. Just pat the incision dry and apply a dry gauze dressing on daily. °Change the surgical dressing daily and reapply a dry dressing each time. ° °ACTIVITY °Walk with your walker as instructed. °Use walker as long as suggested by your caregivers. °Avoid periods of inactivity such as sitting longer than an hour when not asleep. This helps prevent blood clots.  °You may resume a sexual relationship in one month or when given the OK by your doctor.  °You may return to work once you are cleared by your doctor.  °Do not drive a car for 6 weeks or until released by you surgeon.  °Do not drive while taking narcotics. ° °WEIGHT BEARING °Weight bearing as tolerated with assist device (walker, cane, etc) as directed, use it as long as suggested by your surgeon or therapist, typically at least 4-6 weeks. ° °POSTOPERATIVE CONSTIPATION PROTOCOL °Constipation - defined medically as fewer than three stools per week and severe constipation as less than one stool per week. ° °One of the most common issues patients have following surgery is constipation.  Even if you have a regular bowel pattern at home, your normal regimen is likely to be disrupted due to multiple reasons following surgery.  Combination of anesthesia, postoperative narcotics, change in appetite and fluid intake all can affect your bowels.    In order to avoid complications following surgery, here are some recommendations in order to help you during your recovery period. ° °Colace (docusate) - Pick up an over-the-counter form of Colace or another stool softener and take twice a day as long as you are requiring postoperative pain medications.  Take with a full glass of water daily.  If you experience loose stools or diarrhea, hold the colace until you stool forms  back up.  If your symptoms do not get better within 1 week or if they get worse, check with your doctor. ° °Dulcolax (bisacodyl) - Pick up over-the-counter and take as directed by the product packaging as needed to assist with the movement of your bowels.  Take with a full glass of water.  Use this product as needed if not relieved by Colace only.  ° °MiraLax (polyethylene glycol) - Pick up over-the-counter to have on hand.  MiraLax is a solution that will increase the amount of water in your bowels to assist with bowel movements.  Take as directed and can mix with a glass of water, juice, soda, coffee, or tea.  Take if you go more than two days without a movement. °Do not use MiraLax more than once per day. Call your doctor if you are still constipated or irregular after using this medication for 7 days in a row. ° °If you continue to have problems with postoperative constipation, please contact the office for further assistance and recommendations.  If you experience "the worst abdominal pain ever" or develop nausea or vomiting, please contact the office immediatly for further recommendations for treatment. ° °ITCHING ° If you experience itching with your medications, try taking only a single pain pill, or even half a pain pill at a time.  You can also use Benadryl over the counter for itching or also to help with sleep.  ° °TED HOSE STOCKINGS °Wear the elastic stockings on both legs for three weeks following surgery during the day but you may remove then at night for sleeping. ° °MEDICATIONS °See your medication summary on the “After Visit Summary” that the nursing staff will review with you prior to discharge.  You may have some home medications which will be placed on hold until you complete the course of blood thinner medication.  It is important for you to complete the blood thinner medication as prescribed by your surgeon.  Continue your approved medications as instructed at time of  discharge. ° °PRECAUTIONS °If you experience chest pain or shortness of breath - call 911 immediately for transfer to the hospital emergency department.  °If you develop a fever greater that 101 F, purulent drainage from wound, increased redness or drainage from wound, foul odor from the wound/dressing, or calf pain - CONTACT YOUR SURGEON.   °                                                °FOLLOW-UP APPOINTMENTS °Make sure you keep all of your appointments after your operation with your surgeon and caregivers. You should call the office at the above phone number and make an appointment for approximately two weeks after the date of your surgery or on the date instructed by your surgeon outlined in the "After Visit Summary". ° ° °RANGE OF MOTION AND STRENGTHENING EXERCISES  °Rehabilitation of the knee is important following a knee injury or   an operation. After just a few days of immobilization, the muscles of the thigh which control the knee become weakened and shrink (atrophy). Knee exercises are designed to build up the tone and strength of the thigh muscles and to improve knee motion. Often times heat used for twenty to thirty minutes before working out will loosen up your tissues and help with improving the range of motion but do not use heat for the first two weeks following surgery. These exercises can be done on a training (exercise) mat, on the floor, on a table or on a bed. Use what ever works the best and is most comfortable for you Knee exercises include:  Leg Lifts - While your knee is still immobilized in a splint or cast, you can do straight leg raises. Lift the leg to 60 degrees, hold for 3 sec, and slowly lower the leg. Repeat 10-20 times 2-3 times daily. Perform this exercise against resistance later as your knee gets better.  Quad and Hamstring Sets - Tighten up the muscle on the front of the thigh (Quad) and hold for 5-10 sec. Repeat this 10-20 times hourly. Hamstring sets are done by pushing the  foot backward against an object and holding for 5-10 sec. Repeat as with quad sets.   Leg Slides: Lying on your back, slowly slide your foot toward your buttocks, bending your knee up off the floor (only go as far as is comfortable). Then slowly slide your foot back down until your leg is flat on the floor again.  Angel Wings: Lying on your back spread your legs to the side as far apart as you can without causing discomfort.  A rehabilitation program following serious knee injuries can speed recovery and prevent re-injury in the future due to weakened muscles. Contact your doctor or a physical therapist for more information on knee rehabilitation.   IF YOU ARE TRANSFERRED TO A SKILLED REHAB FACILITY If the patient is transferred to a skilled rehab facility following release from the hospital, a list of the current medications will be sent to the facility for the patient to continue.  When discharged from the skilled rehab facility, please have the facility set up the patient's [place] prior to being released. Also, the skilled facility will be responsible for providing the patient with their medications at time of release from the facility to include their pain medication, the muscle relaxants, and their blood thinner medication. If the patient is still at the rehab facility at time of the two week follow up appointment, the skilled rehab facility will also need to assist the patient in arranging follow up appointment in our office and any transportation needs.  MAKE SURE YOU:  Understand these instructions.  Get help right away if you are not doing well or get worse.    Pick up stool softner and laxative for home use following surgery while on pain medications. Do not submerge incision under water. Please use good hand washing techniques while changing dressing each day. May shower starting three days after surgery. Please use a clean towel to pat the incision dry following  showers. Continue to use ice for pain and swelling after surgery. Do not use any lotions or creams on the incision until instructed by your surgeon.  Resume the Xarelto 20 mg dosing at home at time of discharge.

## 2015-11-03 NOTE — Care Management Note (Signed)
Case Management Note  Patient Details  Name: [name] MRN: [id number] Date of Birth: 25-Oct-1940  Subjective/Objective:                  TOTAL LEFT KNEE ARTHROPLASTY (Left) Action/Plan:   Expected Discharge Date:  11/04/15               Expected Discharge Plan:     In-House Referral:     Discharge planning Services     Post Acute Care Choice:    Choice offered to:     DME Arranged:    DME Agency:     HH Arranged:    [place] Agency:     Status of Service:     Medicare Important Message Given:    Date Medicare IM Given:    Medicare IM give by:    Date Additional Medicare IM Given:    Additional Medicare Important Message give by:     If discussed at [place] of Stay Meetings, dates discussed:    Additional Comments: CM met with pt to offer choice of home health agency.  Pt chooses [facility] to render HHPT.  Referral given to [facility] rep, Tim (on unit).  CM called [facility] DME rep, Lecretia to please deliver the 3n1 to room prior to discharge.  No other CM needs. Dellie Catholic, RN 11/03/2015, 3:52 PM

## 2015-11-04 DIAGNOSIS — I480 Paroxysmal atrial fibrillation: Secondary | ICD-10-CM

## 2015-11-04 DIAGNOSIS — M1712 Unilateral primary osteoarthritis, left knee: Principal | ICD-10-CM

## 2015-11-04 LAB — BASIC METABOLIC PANEL
Anion gap: 9 (ref 5–15)
BUN: 25 mg/dL — AB (ref 6–20)
CALCIUM: 9.4 mg/dL (ref 8.9–10.3)
CO2: 27 mmol/L (ref 22–32)
Chloride: 104 mmol/L (ref 101–111)
Creatinine, Ser: 0.79 mg/dL (ref 0.61–1.24)
GFR calc Af Amer: >60 mL/min (ref >60)
GLUCOSE: 137 mg/dL — AB (ref 65–99)
Potassium: 4.2 mmol/L (ref 3.5–5.1)
SODIUM: 140 mmol/L (ref 135–145)

## 2015-11-04 LAB — CBC
HCT: 42.5 % (ref 39.0–52.0)
Hemoglobin: 13.8 g/dL (ref 13.0–17.0)
MCH: 29.1 pg (ref 26.0–34.0)
MCHC: 32.5 g/dL (ref 30.0–36.0)
MCV: 89.5 fL (ref 78.0–100.0)
PLATELETS: 636 10*3/uL — AB (ref 150–400)
RBC: 4.75 MIL/uL (ref 4.22–5.81)
RDW: 15.1 % (ref 11.5–15.5)
WBC: 15.1 10*3/uL — AB (ref 4.0–10.5)

## 2015-11-04 LAB — GLUCOSE, CAPILLARY
GLUCOSE-CAPILLARY: 139 mg/dL — AB (ref 65–99)
GLUCOSE-CAPILLARY: 270 mg/dL — AB (ref 65–99)
Glucose-Capillary: 179 mg/dL — ABNORMAL HIGH (ref 65–99)

## 2015-11-04 MED ORDER — METHOCARBAMOL 500 MG PO TABS: 500.0000 mg | ORAL_TABLET | Freq: Four times a day (QID) | ORAL | Status: DC | PRN

## 2015-11-04 MED ORDER — OXYCODONE HCL 5 MG PO TABS: 5.0000 mg | ORAL_TABLET | ORAL | Status: DC | PRN

## 2015-11-04 NOTE — Progress Notes (Signed)
Physical Therapy Treatment Patient Details Name: [name] MRN: [id number] DOB: Mar 16, 1941 Today's Date: 11/04/2015    History of Present Illness L TKA    PT Comments    POD # 2 am session.  Pt progressing slowly.  Applied KI and assisted OOB to amb to bathroom was a struggle.  HR range 125 to 141 with noted diaphoresis but no other c/o's.  Pt stated he just feels "tiered".  Assisted in bathroom for safety.  Assisted to recliner and applied ICE.  Pt exhausted. Will return for another session to address stairs.   Follow Up Recommendations  Home health PT     Equipment Recommendations  None recommended by PT (will issue one crutch for stairs)    Recommendations for Other Services       Precautions / Restrictions Precautions Precautions: Knee;Fall Precaution Comments: instructed on KI use for amb  Required Braces or Orthoses: Knee Immobilizer - Left Knee Immobilizer - Left: Discontinue once straight leg raise with < 10 degree lag Restrictions Weight Bearing Restrictions: Yes LLE Weight Bearing: Weight bearing as tolerated    Mobility  Bed Mobility Overal bed mobility: Needs Assistance Bed Mobility: Supine to Sit     Supine to sit: Min assist     General bed mobility comments: increased time and assist with L LE   Transfers Overall transfer level: Needs assistance Equipment used: Rolling walker (2 wheeled) Transfers: Sit to/from Stand Sit to Stand: Min guard;Min assist         General transfer comment: 25% VC's on proper tech and hand placement.  50% VC's to extend L LE prior to sit.  Increased time to power up.   Ambulation/Gait Ambulation/Gait assistance: Min assist;+2 safety/equipment Ambulation Distance (Feet): 20 Feet (10 feet x 2 to and from bathroom. ) Assistive device: Rolling walker (2 wheeled) Gait Pattern/deviations: Step-to pattern;Decreased stance time - left Gait velocity: decreased   General Gait Details: struggling.  only able to tolerate  amb to and from bathroom this morning.  Irregular HR range 125 to 141 with mac c/o fatigue.  Pt diaphoretic but otherwise no c/o's.     Stairs            Wheelchair Mobility    Modified Rankin (Stroke Patients Only)       Balance Overall balance assessment: Needs assistance Sitting-balance support: No upper extremity supported;Feet supported Sitting balance-Leahy Scale: Good     Standing balance support: No upper extremity supported;During functional activity Standing balance-Leahy Scale: Fair Standing balance comment: standing at sink to wash hands                    Cognition Arousal/Alertness: Awake/alert Behavior During Therapy: WFL for tasks assessed/performed Overall Cognitive Status: Within Functional Limits for tasks assessed                      Exercises      General Comments        Pertinent Vitals/Pain Pain Assessment: 0-10 Pain Score: 5  Faces Pain Scale: Hurts even more Pain Location: L knee Pain Descriptors / Indicators: Aching;Sore;Tender Pain Intervention(s): Monitored during session;Premedicated before session;Repositioned;Ice applied    Home Living                      Prior Function            PT Goals (current goals can now be found in the care plan section) Acute Rehab PT Goals  Patient Stated Goal: go home tomorrow Progress towards PT goals: Progressing toward goals    Frequency  7X/week    PT Plan Current plan remains appropriate    Co-evaluation             End of Session Equipment Utilized During Treatment: Gait belt Activity Tolerance: Patient limited by fatigue Patient left: in chair;with call bell/phone within reach     Time: 0920-1000 PT Time Calculation (min) (ACUTE ONLY): 40 min  Charges:  $Gait Training: 23-37 mins $Therapeutic Activity: 8-22 mins                    G Codes:      Rica Koyanagi  PTA [facility]  Acute  Rehab Pager      [phone]

## 2015-11-04 NOTE — Progress Notes (Signed)
Physical Therapy Treatment Patient Details Name: [name] MRN: [id number] DOB: 12-Feb-1941 Today's Date: 11/04/2015    History of Present Illness L TKA    PT Comments    POD # 2 pm session.  Pt OOB in recliner c/o 8/10 knee pain.  Last pain meds taken around 8am so called RN.  Returned 30 min later to assist pt with amb and practice stairs.  Pt moving slowly.  Practiced stairs with much effort.  Max c/o fatigue.    Follow Up Recommendations  Home health PT     Equipment Recommendations  None recommended by PT (will issue one crutch for stairs)    Recommendations for Other Services       Precautions / Restrictions Precautions Precautions: Knee;Fall Precaution Comments: instructed on KI use for amb  Required Braces or Orthoses: Knee Immobilizer - Left Knee Immobilizer - Left: Discontinue once straight leg raise with < 10 degree lag Restrictions Weight Bearing Restrictions: Yes LLE Weight Bearing: Weight bearing as tolerated    Mobility  Bed Mobility Overal bed mobility: Needs Assistance Bed Mobility: Supine to Sit     Supine to sit: Min assist     General bed mobility comments: Pt OOB in recliner  Transfers Overall transfer level: Needs assistance Equipment used: Rolling walker (2 wheeled) Transfers: Sit to/from Stand Sit to Stand: Min guard;Min assist         General transfer comment: 25% VC's on proper tech and hand placement.  50% VC's to extend L LE prior to sit.  Increased time to power up.   Ambulation/Gait Ambulation/Gait assistance: Min assist;+2 safety/equipment Ambulation Distance (Feet): 45 Feet Assistive device: Rolling walker (2 wheeled) Gait Pattern/deviations: Step-to pattern;Decreased stance time - left Gait velocity: decreased   General Gait Details: tolerated increased distance this afternoon.  HR range 88 to 109 this afternoon.     Stairs Stairs: Yes Stairs assistance: Max assist   Number of Stairs: 2 General stair comments:  using one rail and one crutch with 50% VC's on proper tech and safety.    Wheelchair Mobility    Modified Rankin (Stroke Patients Only)       Balance Overall balance assessment: Needs assistance Sitting-balance support: No upper extremity supported;Feet supported Sitting balance-Leahy Scale: Good     Standing balance support: No upper extremity supported;During functional activity Standing balance-Leahy Scale: Fair Standing balance comment: standing at sink to wash hands                    Cognition Arousal/Alertness: Awake/alert Behavior During Therapy: WFL for tasks assessed/performed Overall Cognitive Status: Within Functional Limits for tasks assessed                      Exercises      General Comments        Pertinent Vitals/Pain Pain Assessment: 0-10 Pain Score: 5  Faces Pain Scale: Hurts even more Pain Location: L knee Pain Descriptors / Indicators: Aching;Sore;Tender Pain Intervention(s): Monitored during session;Premedicated before session;Repositioned;Ice applied    Home Living                      Prior Function            PT Goals (current goals can now be found in the care plan section) Acute Rehab PT Goals Patient Stated Goal: go home tomorrow Progress towards PT goals: Progressing toward goals    Frequency  7X/week    PT Plan  Current plan remains appropriate    Co-evaluation             End of Session Equipment Utilized During Treatment: Gait belt Activity Tolerance: Patient limited by fatigue Patient left: in chair;with call bell/phone within reach     Time: 1530-1600 PT Time Calculation (min) (ACUTE ONLY): 30 min  Charges:  $Gait Training: 8-22 mins $Therapeutic Activity: 8-22 mins                    G Codes:      Rica Koyanagi  PTA [facility]  Acute  Rehab Pager      [phone]

## 2015-11-04 NOTE — Consult Note (Signed)
CONSULT NOTE  Date: 11/04/2015               Patient Name:  [name] MRN: [id number]  DOB: June 26, 1941 Age / Sex: 75 y.o., male        PCP: Sheela Stack Primary Cardiologist: Tamala Julian            Referring Physician: Maureen Ralphs              Reason for Consult: Atrial fib, tachycardia            History of Present Illness: Patient is a 75 y.o. male with a PMHx of A-fib, OSA,HTN hyperlipidema, who was admitted to [facility] on 11/02/2015 for evaluation of knee pain .   He was noted to be tachycardic yesterday  Feels better today .   Medications: Outpatient medications: Prescriptions prior to admission  Medication Sig Dispense Refill Last Dose  . ALPRAZolam (XANAX) 0.5 MG tablet Take 0.5 mg by mouth 2 (two) times daily.    11/02/2015 at 0600  . benazepril (LOTENSIN) 40 MG tablet Take 40 mg by mouth daily.   11/01/2015 at am  . Canagliflozin (INVOKANA) 100 MG TABS Take 50 mg by mouth 2 (two) times daily.    11/01/2015 at 1900  . ergocalciferol (VITAMIN D2) 50000 UNITS capsule Take 50,000 Units by mouth once a week.   2 wk  . furosemide (LASIX) 20 MG tablet Take 20 mg by mouth daily.    11/01/2015 at am  . gabapentin (NEURONTIN) 300 MG capsule Take 300 mg by mouth daily.   11/01/2015 at pm  . insulin aspart protamine-insulin aspart (NOVOLOG 70/30) (70-30) 100 UNIT/ML injection Inject 46-48 Units into the skin 2 (two) times daily with a meal. 46 in the morning and 48 in the evening   11/01/2015 at 1900  . metFORMIN (GLUCOPHAGE) 500 MG tablet Take 500 mg by mouth 2 (two) times daily with a meal.    11/01/2015 at 1900  . Multiple Vitamin (MULTIVITAMIN WITH MINERALS) TABS tablet Take 1 tablet by mouth daily.   10/26/2015  . omeprazole (PRILOSEC) 20 MG capsule Take 20 mg by mouth daily.   11/02/2015 at 0600  . rivaroxaban (XARELTO) 20 MG TABS tablet Take 1 tablet (20 mg total) by mouth daily with supper. 90 tablet 3 10/29/2015 at pm  . simvastatin (ZOCOR) 40 MG tablet Take 40 mg by mouth  daily.   11/01/2015 at pm  . venlafaxine XR (EFFEXOR-XR) 75 MG 24 hr capsule Take 75 mg by mouth daily.   11/02/2015 at 0600    Current medications: Current Facility-Administered Medications  Medication Dose Route Frequency Provider Last Rate Last Dose  . 0.9 %  sodium chloride infusion   Intravenous Continuous [place] Muslim, PA-C 30 mL/hr at 11/03/15 [id number]    . acetaminophen (TYLENOL) tablet 650 mg  650 mg Oral Q6H PRN [name], MD       Or  . acetaminophen (TYLENOL) suppository 650 mg  650 mg Rectal Q6H PRN [name], MD      . ALPRAZolam Duanne Moron) tablet 0.5 mg  0.5 mg Oral BID [name], MD   0.5 mg at 11/04/15 0956  . benazepril (LOTENSIN) tablet 40 mg  40 mg Oral Daily Arlee Muslim, PA-C   40 mg at 11/04/15 0956  . bisacodyl (DULCOLAX) suppository 10 mg  10 mg Rectal Daily PRN [name], MD      . canagliflozin [facility]) tablet 100 mg  100 mg Oral Daily  [name], MD   100 mg at 11/04/15 0956  . diphenhydrAMINE (BENADRYL) 12.5 MG/5ML elixir 12.5-25 mg  12.5-25 mg Oral Q4H PRN [name], MD      . docusate sodium (COLACE) capsule 100 mg  100 mg Oral BID [name], MD   100 mg at 11/04/15 0956  . furosemide (LASIX) tablet 20 mg  20 mg Oral Daily [name], MD   20 mg at 11/04/15 0956  . gabapentin (NEURONTIN) capsule 300 mg  300 mg Oral Daily [name], MD   300 mg at 11/04/15 0956  . insulin aspart (novoLOG) injection 0-15 Units  0-15 Units Subcutaneous TID WC [name], MD   3 Units at 11/04/15 1321  . insulin aspart protamine- aspart (NOVOLOG MIX 70/30) injection 46 Units  46 Units Subcutaneous Q breakfast [name], MD   46 Units at 11/04/15 0810   And  . insulin aspart protamine- aspart (NOVOLOG MIX 70/30) injection 48 Units  48 Units Subcutaneous Q supper [name], MD   48 Units at 11/04/15 1753  . menthol-cetylpyridinium (CEPACOL) lozenge 3 mg  1 lozenge Oral PRN [name], MD       Or  . phenol (CHLORASEPTIC) mouth spray 1 spray  1  spray Mouth/Throat PRN [name], MD      . methocarbamol (ROBAXIN) tablet 500 mg  500 mg Oral Q6H PRN [name], MD   500 mg at 11/04/15 0357   Or  . methocarbamol (ROBAXIN) 500 mg in dextrose 5 % 50 mL IVPB  500 mg Intravenous Q6H PRN [name], MD   500 mg at 11/02/15 1125  . metoCLOPramide (REGLAN) tablet 5-10 mg  5-10 mg Oral Q8H PRN [name], MD       Or  . metoCLOPramide (REGLAN) injection 5-10 mg  5-10 mg Intravenous Q8H PRN [name], MD      . morphine 2 MG/ML injection 2 mg  2 mg Intravenous Q2H PRN [name], MD   2 mg at 11/02/15 1926  . omeprazole (PRILOSEC) capsule 20 mg  20 mg Oral Daily Arlee Muslim, PA-C   20 mg at 11/04/15 [id number]  . ondansetron (ZOFRAN) tablet 4 mg  4 mg Oral Q6H PRN [name], MD       Or  . ondansetron [facility]) injection 4 mg  4 mg Intravenous Q6H PRN [name], MD      . oxyCODONE (Oxy IR/ROXICODONE) immediate release tablet 5-10 mg  5-10 mg Oral Q3H PRN [name], MD   10 mg at 11/04/15 1456  . polyethylene glycol (MIRALAX / GLYCOLAX) packet 17 g  17 g Oral Daily PRN [name], MD      . rivaroxaban [facility]) tablet 10 mg  10 mg Oral Q breakfast [name], MD   10 mg at 11/04/15 0810  . simvastatin (ZOCOR) tablet 40 mg  40 mg Oral q1800 [name], MD   40 mg at 11/04/15 1752  . sodium phosphate (FLEET) 7-19 GM/118ML enema 1 enema  1 enema Rectal Once PRN [name], MD      . venlafaxine XR (EFFEXOR-XR) 24 hr capsule 75 mg  75 mg Oral Daily [name], MD   75 mg at 11/04/15 [id number]     No Known Allergies   Past Medical History  Diagnosis Date  . Diabetes mellitus without complication ([place])   . Hypertension   . Hyperlipidemia   . Obesity   . GERD (gastroesophageal reflux disease)   . Paroxysmal atrial  fibrillation ([place])   . Anxiety   . Depression   . Neuropathy ([place])   . Dysrhythmia     a-fib  . Peripheral vascular disease ([place])     diabetic neuropathy in both feet  . Sleep apnea     uses  C-pap machine  . Arthritis   . Cancer ([place])     skin - basil cell    Past Surgical History  Procedure Laterality Date  . Back surgery  00-02-12    x3  . Colonoscopy    . Knee arthroscopy  005/01/02  . Appendectomy  1962  . Basal cell carcinoma excision  93/06/10  . Total knee arthroplasty Left 11/02/2015    Procedure: TOTAL LEFT KNEE ARTHROPLASTY;  Surgeon: [name], MD;  Location: [facility]S;  Service: Orthopedics;  Laterality: Left;    Family History  Problem Relation Age of Onset  . Cancer Mother   . Diabetes Mellitus II Mother   . Hypertension Mother   . Heart failure Father   . CVA Father   . Hypertension Sister   . Colon cancer Neg Hx     Social History:  reports that he quit smoking about 23 years ago. His smoking use included Cigars. He has never used smokeless tobacco. He reports that he drinks alcohol. He reports that he does not use illicit drugs.   Review of Systems: Constitutional:  denies fever, chills, diaphoresis, appetite change and fatigue.  HEENT: denies photophobia, eye pain, redness, hearing loss, ear pain, congestion, sore throat, rhinorrhea, sneezing, neck pain, neck stiffness and tinnitus.  Respiratory: denies SOB, DOE, cough, chest tightness, and wheezing.  Cardiovascular: denies chest pain, palpitations and leg swelling.  Gastrointestinal: denies nausea, vomiting, abdominal pain, diarrhea, constipation, blood in stool.  Genitourinary: denies dysuria, urgency, frequency, hematuria, flank pain and difficulty urinating.  Musculoskeletal: admits to  Knee pain following surgery   Skin: denies pallor, rash and wound.  Neurological: denies dizziness, seizures, syncope, weakness, light-headedness, numbness and headaches.   Hematological: denies adenopathy, easy bruising, personal or family bleeding history.  Psychiatric/ Behavioral: denies suicidal ideation, mood changes, confusion, nervousness, sleep disturbance and agitation.    Physical Exam: BP  109/57 mmHg  Pulse 95  Temp(Src) 98.7 F (37.1 C) (Oral)  Resp 16  Ht 5\' 11"  (1.803 m)  Wt 284 lb (128.822 kg)  BMI 39.63 kg/m2  SpO2 95%  Wt Readings from Last 3 Encounters:  11/02/15 284 lb (128.822 kg)  10/26/15 284 lb (128.822 kg)  08/27/15 285 lb 1.6 oz (129.321 kg)    General: Vital signs reviewed and noted. Well-developed, well-nourished, in no acute distress; alert,   Head: Normocephalic, atraumatic, sclera anicteric,   Neck: Supple. Negative for carotid bruits. No JVD   Lungs:  Clear bilaterally, no  wheezes, rales, or rhonchi. Breathing is normal   Heart: RRR with S1 S2. No murmurs, rubs, or gallops   Abdomen/ GI :  Soft, non-tender, non-distended with normoactive bowel sounds. No hepatomegaly. No rebound/guarding. No obvious abdominal masses   MSK: Strength and the appear normal for age.   Extremities: No clubbing or cyanosis. No edema.  Distal pedal pulses are 2+ and equal   Neurologic:  CN are grossly intact,  No obvious motor or sensory defect.  Alert and oriented X 3. Moves all extremities spontaneously.  Psych: Responds to questions appropriately with a normal affect.     Lab results: Basic Metabolic Panel:  Recent Labs Lab 11/03/15 0420 11/04/15 0427  NA 139 140  K 4.6 4.2  CL 107 104  CO2 25 27  GLUCOSE 214* 137*  BUN 22* 25*  CREATININE 0.82 0.79  CALCIUM 9.2 9.4    Liver Function Tests: No results for input(s): AST, ALT, ALKPHOS, BILITOT, PROT, ALBUMIN in the last 168 hours. No results for input(s): LIPASE, AMYLASE in the last 168 hours. No results for input(s): AMMONIA in the last 168 hours.  CBC:  Recent Labs Lab 11/03/15 0420 11/04/15 0427  WBC 12.6* 15.1*  HGB 13.7 13.8  HCT 42.6 42.5  MCV 90.1 89.5  PLT 581* 636*    Cardiac Enzymes: No results for input(s): CKTOTAL, CKMB, CKMBINDEX, TROPONINI in the last 168 hours.  BNP: Invalid input(s): POCBNP  CBG:  Recent Labs Lab 11/03/15 1215 11/03/15 1741 11/03/15 2115  11/04/15 0703 11/04/15 1155  GLUCAP 244* 299* 245* 139* 179*    Coagulation Studies:  Recent Labs  11/02/15 0650  LABPROT 14.1  INR 1.07     Other results: Personal review of EKG shows :  From Feb. 22, 2017 - NSR with controlled V rate  Imaging:  No results found.    Assessment & Plan: 1.  Tachycardia:   Pt has a hx of paroxysmal atrial fib.   Was noted to have a fast HR earlier but now he is clearly feeling better. This may have been related to pain .  He is not on tele.   He is very stable and can be discharged to home tomorrow.   No additional medications needed at this time Follow up with Dr. Tamala Julian in the office  2. Paroxysmal atrial fib;   Will start the Xarelto 20 mg a day   tomorrow   3.obstructive sleep apnea. :  Has CPAP   4. DM - per medical doctor   5. Hyperlipidemia:   contineu simva   6. HTN:  Continue benazepril    Thayer Headings, Brooke Bonito., MD, [facility] 11/04/2015, 6:05 PM Office - [phone] Pager [phone]

## 2015-11-04 NOTE — Progress Notes (Signed)
RT placed patient on CPAP. Patient setting is auto 6-20 cmH2O. Sterile water added to water chamber for humidification. 2liters oxygen bleed into tubing. RT will continue to monitor.

## 2015-11-04 NOTE — Progress Notes (Signed)
Occupational Therapy Treatment Patient Details Name: [name] MRN: [id number] DOB: Jan 08, 1941 Today's Date: 11/04/2015    History of present illness L TKA   OT comments  Patient making good progress towards OT goals, continue plan of care for now. Pt limited by HR during session. Upon entering room, patient's HR=111, pt ambulated to BR and HR=66 in sitting, pt ambulated back to recliner and HR as high as 144. Notified RN of this.   Follow Up Recommendations  No OT follow up;Supervision/Assistance - 24 hour    Equipment Recommendations  3 in 1 bedside comode    Recommendations for Other Services  none at this time   Precautions / Restrictions Precautions Precautions: Knee;Fall Precaution Comments: instructed on KI use for amb  Required Braces or Orthoses: Knee Immobilizer - Left Knee Immobilizer - Left: Discontinue once straight leg raise with < 10 degree lag Restrictions Weight Bearing Restrictions: Yes LLE Weight Bearing: Weight bearing as tolerated    Mobility Bed Mobility General bed mobility comments: Pt found seated in recliner upon OT entering room  Transfers Overall transfer level: Needs assistance Equipment used: Rolling walker (2 wheeled) Transfers: Sit to/from Stand Sit to Stand: Min assist General transfer comment: Min assist for safety and cues for hand placement & safety. Pt performed sit to/from stands from recliner and BSC. With initial stand, pt about fell back into chair - therapist providing assistance to keep pt on feet.     Balance Overall balance assessment: Needs assistance Sitting-balance support: No upper extremity supported;Feet supported Sitting balance-Leahy Scale: Good     Standing balance support: No upper extremity supported;During functional activity Standing balance-Leahy Scale: Fair Standing balance comment: standing at sink to wash hands   ADL Overall ADL's : Needs assistance/impaired Eating/Feeding: Set up;Sitting    Grooming: Supervision/safety;Standing Grooming Details (indicate cue type and reason): at sink Upper Body Bathing: Set up;Sitting   Lower Body Bathing: Moderate assistance;Sit to/from stand   Upper Body Dressing : Set up;Sitting   Lower Body Dressing: Moderate assistance;Sit to/from stand   Toilet Transfer: Min guard;RW;Ambulation       Tub/ Banker: Minimal assistance;Anterior/posterior;Rolling walker;3 in 1   Functional mobility during ADLs: Rolling walker;Cueing for safety;Min guard General ADL Comments: Pt unable to reach BLEs for LB ADLs and states wife can assist with this.            Cognition   Behavior During Therapy: WFL for tasks assessed/performed Overall Cognitive Status: Within Functional Limits for tasks assessed                 Pertinent Vitals/ Pain       Pain Assessment: Faces Faces Pain Scale: Hurts even more Pain Location: bilateral knees Pain Descriptors / Indicators: Aching;Sore Pain Intervention(s): Limited activity within patient's tolerance;Monitored during session;Repositioned   Frequency Min 2X/week     Progress Toward Goals  OT Goals(current goals can now befound in the care plan section)  Progress towards OT goals: Progressing toward goals  Acute Rehab OT Goals Patient Stated Goal: go home tomorrow OT Goal Formulation: With patient Time For Goal Achievement: 11/10/15 Potential to Achieve Goals: Good  Plan Discharge plan remains appropriate    End of Session Equipment Utilized During Treatment: Gait belt;Rolling walker;Left knee immobilizer   Activity Tolerance Patient tolerated treatment well   Patient Left in chair;with call bell/phone within reach  Nurse Communication Other (comment) (HR during session)      Time: 1324-1350 OT Time Calculation (min): 26 min  Charges:  OT General Charges $OT Visit: 1 Procedure OT Treatments $Self Care/Home Management : 23-37 mins  Chrys Racer , MS, OTR/L, [place] Pager:  [phone]  11/04/2015, 2:10 PM

## 2015-11-04 NOTE — Progress Notes (Signed)
[facility]  [facility] is providing the following services: Commode  If patient discharges after hours, please call [phone].   Linward Headland 11/04/2015, 10:38 AM

## 2015-11-04 NOTE — Progress Notes (Signed)
Subjective: 2 Days Post-Op Procedure(s) (LRB): TOTAL LEFT KNEE ARTHROPLASTY (Left) Patient reports pain as moderate.   Patient seen in rounds for Dr. Wynelle Link. Episode of tachycardia yesterday with therapy.  Rate slowed without intervention. Patient states that he has known A.Fib, paroxysmal in nature.  Will get EKG if speeds up again.  Will se how he does with therapy today. Need to keep pain under control.  Possible home later this afternoon if he does well and no other issues.  The earliest will be this afternoon.  Have a very low threshold for keeping him today if any issues arise.  Denies any SOB ro CP Patient is having problems with pain in the knee, requiring pain medications Patient might be ready to go home later today depending upon results.  Objective: Vital signs in last 24 hours: Temp:  [98.3 F (36.8 C)-99.4 F (37.4 C)] 98.3 F (36.8 C) (02/22 0659) Pulse Rate:  [69-120] 69 (02/22 0438) Resp:  [16-18] 16 (02/22 0438) BP: (153-172)/(65-82) 157/66 mmHg (02/22 0438) SpO2:  [94 %-97 %] 94 % (02/22 0438)  Intake/Output from previous day:  Intake/Output Summary (Last 24 hours) at 11/04/15 1032 Last data filed at 11/04/15 0912  Gross per 24 hour  Intake   1200 ml  Output   4800 ml  Net  -3600 ml    Intake/Output this shift: Total I/O In: 240 [P.O.:240] Out: -   Labs:  Recent Labs  11/03/15 0420 11/04/15 0427  HGB 13.7 13.8    Recent Labs  11/03/15 0420 11/04/15 0427  WBC 12.6* 15.1*  RBC 4.73 4.75  HCT 42.6 42.5  PLT 581* 636*    Recent Labs  11/03/15 0420 11/04/15 0427  NA 139 140  K 4.6 4.2  CL 107 104  CO2 25 27  BUN 22* 25*  CREATININE 0.82 0.79  GLUCOSE 214* 137*  CALCIUM 9.2 9.4    Recent Labs  11/02/15 0650  INR 1.07    EXAM: General - Patient is Alert, Appropriate and Oriented Extremity - Neurovascular intact Sensation intact distally Dorsiflexion/Plantar flexion intact Incision - clean, dry, no drainage, healing Motor  Function - intact, moving foot and toes well on exam.   Assessment/Plan: 2 Days Post-Op Procedure(s) (LRB): TOTAL LEFT KNEE ARTHROPLASTY (Left) Procedure(s) (LRB): TOTAL LEFT KNEE ARTHROPLASTY (Left) Past Medical History  Diagnosis Date  . Diabetes mellitus without complication ([place])   . Hypertension   . Hyperlipidemia   . Obesity   . GERD (gastroesophageal reflux disease)   . Paroxysmal atrial fibrillation ([facility])   . Anxiety   . Depression   . Neuropathy ([place])   . Dysrhythmia     a-fib  . Peripheral vascular disease ([place])     diabetic neuropathy in both feet  . Sleep apnea     uses C-pap machine  . Arthritis   . Cancer ([place])     skin - basil cell   Principal Problem:   OA (osteoarthritis) of knee  Estimated body mass index is 39.63 kg/(m^2) as calculated from the following:   Height as of this encounter: 5\' 11"  (1.803 m).   Weight as of this encounter: 128.822 kg (284 lb). Up with therapy Discharge home with home health if no further issues. Diet - Cardiac diet Follow up - in 2 weeks on Thursday 11/19/2015 Activity - WBAT Disposition - Home Condition Upon Discharge - Pending D/C Meds - See DC Summary DVT Prophylaxis - Xarelto 20 mg at home.  Resume home dosing  Dian Situ  Dara Lords, PA-C Orthopaedic Surgery 11/04/2015, 10:32 AM

## 2015-11-05 LAB — CBC
HEMATOCRIT: 45.2 % (ref 39.0–52.0)
HEMOGLOBIN: 14.8 g/dL (ref 13.0–17.0)
MCH: 29 pg (ref 26.0–34.0)
MCHC: 32.7 g/dL (ref 30.0–36.0)
MCV: 88.6 fL (ref 78.0–100.0)
Platelets: 623 10*3/uL — ABNORMAL HIGH (ref 150–400)
RBC: 5.1 MIL/uL (ref 4.22–5.81)
RDW: 14.9 % (ref 11.5–15.5)
WBC: 13.4 10*3/uL — AB (ref 4.0–10.5)

## 2015-11-05 LAB — GLUCOSE, CAPILLARY: GLUCOSE-CAPILLARY: 128 mg/dL — AB (ref 65–99)

## 2015-11-05 NOTE — Progress Notes (Signed)
   Subjective: 3 Days Post-Op Procedure(s) (LRB): TOTAL LEFT KNEE ARTHROPLASTY (Left) Patient reports pain as mild.   Patient seen in rounds with Dr. Wynelle Link.  Patient states that he feels better today. He felt weak yesterday following the bouts of tachycardia. Greatly appreciate Cardiology consult.  Patient already on Xarelto at home but wanted to make sure no further needs or meds for the PAF with runs of tachycardia and associated weakness.  Doing better today and will allow home. Patient is well, but has had some minor complaints of pain in the knee, requiring pain medications Patient is ready to go home later this morning.  Objective: Vital signs in last 24 hours: Temp:  [98.1 F (36.7 C)-99.8 F (37.7 C)] 99.8 F (37.7 C) (02/23 0559) Pulse Rate:  [62-95] 62 (02/23 0559) Resp:  [16] 16 (02/23 0559) BP: (109-143)/(57-70) 137/69 mmHg (02/23 0559) SpO2:  [91 %-96 %] 91 % (02/23 0559)  Intake/Output from previous day:  Intake/Output Summary (Last 24 hours) at 11/05/15 0816 Last data filed at 11/05/15 0600  Gross per 24 hour  Intake   1200 ml  Output   2725 ml  Net  -1525 ml    Labs:  Recent Labs  11/03/15 0420 11/04/15 0427 11/05/15 0414  HGB 13.7 13.8 14.8    Recent Labs  11/04/15 0427 11/05/15 0414  WBC 15.1* 13.4*  RBC 4.75 5.10  HCT 42.5 45.2  PLT 636* 623*    Recent Labs  11/03/15 0420 11/04/15 0427  NA 139 140  K 4.6 4.2  CL 107 104  CO2 25 27  BUN 22* 25*  CREATININE 0.82 0.79  GLUCOSE 214* 137*  CALCIUM 9.2 9.4   No results for input(s): LABPT, INR in the last 72 hours.  EXAM: General - Patient is Alert, Appropriate and Oriented Extremity - Neurovascular intact Sensation intact distally Dorsiflexion/Plantar flexion intact Incision - clean, dry, no drainage Motor Function - intact, moving foot and toes well on exam.   Assessment/Plan: 3 Days Post-Op Procedure(s) (LRB): TOTAL LEFT KNEE ARTHROPLASTY (Left) Procedure(s) (LRB): TOTAL  LEFT KNEE ARTHROPLASTY (Left) Past Medical History  Diagnosis Date  . Diabetes mellitus without complication ([place])   . Hypertension   . Hyperlipidemia   . Obesity   . GERD (gastroesophageal reflux disease)   . Paroxysmal atrial fibrillation ([facility])   . Anxiety   . Depression   . Neuropathy ([place])   . Dysrhythmia     a-fib  . Peripheral vascular disease ([place])     diabetic neuropathy in both feet  . Sleep apnea     uses C-pap machine  . Arthritis   . Cancer ([place])     skin - basil cell   Principal Problem:   OA (osteoarthritis) of knee  Estimated body mass index is 39.63 kg/(m^2) as calculated from the following:   Height as of this encounter: 5\' 11"  (1.803 m).   Weight as of this encounter: 128.822 kg (284 lb). Advance diet Up with therapy Discharge home with home health Diet - Cardiac diet, diabetic diet Follow up - in 2 weeks on Thursday 11/19/2015 Activity - WBAT Disposition - Home Condition Upon Discharge - Stable D/C Meds - See DC Summary DVT Prophylaxis - Xarelto 20 mg at home, already has meds at home.  [place] Muslim, PA-C Orthopaedic Surgery 11/05/2015, 8:16 AM

## 2015-11-05 NOTE — Progress Notes (Signed)
Physical Therapy Treatment Patient Details Name: [name] MRN: [id number] DOB: 06/30/1941 Today's Date: 11/05/2015    History of Present Illness L TKA    PT Comments    Reviewed with wife knee immobilizer placement , up/down 2-3 steps, that son should stand in front of patient going down the 7. Patient improved today, requires frequent reminders of safe step negotiation.  Follow Up Recommendations  Home health PT     Equipment Recommendations  None recommended by PT    Recommendations for Other Services       Precautions / Restrictions Precautions Precautions: Knee;Fall Precaution Comments: instructed on KI use for amb  Required Braces or Orthoses: Knee Immobilizer - Left Knee Immobilizer - Left: Discontinue once straight leg raise with < 10 degree lag    Mobility  Bed Mobility   Bed Mobility: Supine to Sit     Supine to sit: Min assist     General bed mobility comments: wife present to instruct in  [place] and technique for OOB.  Transfers Overall transfer level: Needs assistance Equipment used: Rolling walker (2 wheeled) Transfers: Sit to/from Stand Sit to Stand: Min guard;Min assist         General transfer comment: cues for hand placement LLE position, practiced x 3 to stand total.  Ambulation/Gait Ambulation/Gait assistance: Min assist Ambulation Distance (Feet): 45 Feet   Gait Pattern/deviations: Step-to pattern;Antalgic Gait velocity: decreased   General Gait Details: tolerated increased distance this afternoon. improved gait with lower RW   Stairs Stairs: Yes Stairs assistance: +2 safety/equipment Stair Management: One rail Left;Step to pattern;Forwards;With crutches Number of Stairs: 2 General stair comments: using L rail and R crutch up 3  and down 2, wife present nd reiterated that patient and son are working together, son to stand in front while descending 7 steps.  Wheelchair Mobility    Modified Rankin (Stroke Patients Only)        Balance                                    Cognition Arousal/Alertness: Awake/alert Behavior During Therapy: WFL for tasks assessed/performed;Agitated Overall Cognitive Status: Within Functional Limits for tasks assessed                      Exercises Total Joint Exercises Ankle Circles/Pumps: AROM;Both;10 reps Heel Slides: AAROM;Left;10 reps Straight Leg Raises: AROM;Left;5 reps    General Comments        Pertinent Vitals/Pain Pain Score: 3  Pain Location: L knee Pain Descriptors / Indicators: Discomfort    Home Living                      Prior Function            PT Goals (current goals can now be found in the care plan section) Progress towards PT goals: Progressing toward goals    Frequency  7X/week    PT Plan Current plan remains appropriate    Co-evaluation             End of Session Equipment Utilized During Treatment: Left knee immobilizer Activity Tolerance: Patient tolerated treatment well Patient left: with family/visitor present     Time: [id number] PT Time Calculation (min) (ACUTE ONLY): 53 min  Charges:  $Gait Training: 23-37 mins $Self Care/Home Management: 23-37  G Codes:      Claretha Cooper 11/05/2015, 4:01 PM Tresa Endo PT [phone]

## 2016-06-08 ENCOUNTER — Other Ambulatory Visit: Payer: Self-pay | Admitting: Dermatology

## 2016-08-24 ENCOUNTER — Encounter: Payer: Self-pay | Admitting: Interventional Cardiology

## 2016-08-24 ENCOUNTER — Ambulatory Visit (INDEPENDENT_AMBULATORY_CARE_PROVIDER_SITE_OTHER): Payer: Medicare Other | Admitting: Interventional Cardiology

## 2016-08-24 ENCOUNTER — Encounter (INDEPENDENT_AMBULATORY_CARE_PROVIDER_SITE_OTHER): Payer: Self-pay

## 2016-08-24 VITALS — BP 106/62 | HR 98 | Ht 72.0 in | Wt 280.8 lb

## 2016-08-24 DIAGNOSIS — I480 Paroxysmal atrial fibrillation: Secondary | ICD-10-CM

## 2016-08-24 DIAGNOSIS — E669 Obesity, unspecified: Secondary | ICD-10-CM

## 2016-08-24 DIAGNOSIS — I495 Sick sinus syndrome: Secondary | ICD-10-CM

## 2016-08-24 DIAGNOSIS — G4733 Obstructive sleep apnea (adult) (pediatric): Secondary | ICD-10-CM

## 2016-08-24 DIAGNOSIS — Z7901 Long term (current) use of anticoagulants: Secondary | ICD-10-CM

## 2016-08-24 DIAGNOSIS — I10 Essential (primary) hypertension: Secondary | ICD-10-CM | POA: Diagnosis not present

## 2016-08-24 MED ORDER — RIVAROXABAN 20 MG PO TABS: 20.0000 mg | ORAL_TABLET | Freq: Every day | ORAL | 3 refills | Status: DC

## 2016-08-24 NOTE — Patient Instructions (Signed)
Medication Instructions:  None  Labwork: None  Testing/Procedures: Your physician has recommended that you wear a 48 hour holter monitor. Holter monitors are medical devices that record the heart's electrical activity. Doctors most often use these monitors to diagnose arrhythmias. Arrhythmias are problems with the speed or rhythm of the heartbeat. The monitor is a small, portable device. You can wear one while you do your normal daily activities. This is usually used to diagnose what is causing palpitations/syncope (passing out).    Follow-Up: Your physician wants you to follow-up in: 1 year with Dr. Smith.  You will receive a reminder letter in the mail two months in advance. If you don't receive a letter, please call our office to schedule the follow-up appointment.   Any Other Special Instructions Will Be Listed Below (If Applicable).     If you need a refill on your cardiac medications before your next appointment, please call your pharmacy.   

## 2016-08-24 NOTE — Progress Notes (Signed)
Cardiology Office Note    Date:  08/24/2016   ID:  [name], DOB [birth year], MRN [id number]  PCP:  Sheela Stack, MD  Cardiologist: [name], MD   Chief Complaint  Patient presents with  . Atrial Fibrillation    History of Present Illness:  [name] is a 75 y.o. male who presents for asymptomatic atrial fibrillation, obstructive sleep apnea, hypertension, diabetes, and hyperlipidemia.   In the interval since our last office visit, he has undergone left knee replacement surgery. He is asymptomatic from a cardiopulmonary standpoint. He denies chest pain and syncope. He has occasional fluttering in the chest. He states that Dr. Forde Dandy documented normal heart rhythm when last seen within the past 2 months. He has stopped wearing C Pap for sleep apnea. He still snores according to his wife.       Past Medical History:  Diagnosis Date  . Anxiety   . Arthritis   . Cancer (HCC)    skin - basil cell  . Depression   . Diabetes mellitus without complication ([place])   . Dysrhythmia    a-fib  . GERD (gastroesophageal reflux disease)   . Hyperlipidemia   . Hypertension   . Neuropathy ([place])   . Obesity   . Paroxysmal atrial fibrillation (HCC)   . Peripheral vascular disease ([place])    diabetic neuropathy in both feet  . Sleep apnea    uses C-pap machine    Past Surgical History:  Procedure Laterality Date  . APPENDECTOMY  1962  . BACK SURGERY  00-02-12   x3  . BASAL CELL CARCINOMA EXCISION  93/06/10  . COLONOSCOPY    . KNEE ARTHROSCOPY  005/01/02  . 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 ALPRAZolam (XANAX) 0.5 MG tablet Take 0.5 mg by mouth 2 (two) times daily.     . benazepril (LOTENSIN) 40 MG tablet Take 40 mg by mouth  daily.    . Canagliflozin (INVOKANA) 100 MG TABS Take 50 mg by mouth 2 (two) times daily.     . furosemide (LASIX) 20 MG tablet Take 20 mg by mouth daily.     [name] Kitchen gabapentin (NEURONTIN) 300 MG capsule Take 300 mg by mouth daily.    . insulin aspart protamine-insulin aspart (NOVOLOG 70/30) (70-30) 100 UNIT/ML injection Inject 46-48 Units into the skin 2 (two) times daily with a meal. 46 in the morning and 48 in the evening    . metFORMIN (GLUCOPHAGE) 500 MG tablet Take 500 mg by mouth 2 (two) times daily with a meal.     . omeprazole (PRILOSEC) 20 MG capsule Take 20 mg by mouth daily.    . rivaroxaban (XARELTO) 20 MG TABS tablet Take 1 tablet (20 mg total) by mouth daily with supper. 90 tablet 3  . simvastatin (ZOCOR) 40 MG tablet Take 40 mg by mouth daily.    [name] Kitchen venlafaxine XR (EFFEXOR-XR) 75 MG 24 hr capsule Take 75 mg by mouth daily.    . methocarbamol (ROBAXIN) 500 MG tablet Take 1 tablet (500 mg total) by mouth every 6 (six) hours as needed for muscle spasms. (Patient not taking: Reported on 08/24/2016) 90 tablet 0  . oxyCODONE (OXY IR/ROXICODONE) 5 MG immediate release tablet Take 1-2 tablets (5-10 mg total) by  mouth every 3 (three) hours as needed for moderate pain or severe pain. (Patient not taking: Reported on 08/24/2016) 90 tablet 0   No facility-administered medications prior to visit.      Allergies:   Patient has no known allergies.   Social History   Social History  . Marital status: Married    Spouse name: N/A  . Number of children: N/A  . Years of education: N/A   Social History Main Topics  . Smoking status: Former Smoker    Types: Cigars    Quit date: 08/02/1992  . Smokeless tobacco: Never Used  . Alcohol use 0.0 oz/week     Comment: beer   . Drug use: No  . Sexual activity: Not Asked   Other Topics Concern  . None   Social History Narrative  . None     Family History:  The patient's family history includes CVA in his father; Cancer in his mother; Diabetes  Mellitus II in his mother; Heart failure in his father; Hypertension in his mother and sister.   ROS:   Please see the history of present illness.    Snoring, vision disturbance, difficulty with balance and ambulation. Stopped wearing C Pap.  All other systems reviewed and are negative.   PHYSICAL EXAM:   VS:  BP 106/62 (BP Location: Left Arm)   Pulse 98   Ht 6' (1.829 m)   Wt 280 lb 12.8 oz (127.4 kg)   BMI 38.08 kg/m    GEN: Well nourished, well developed, in no acute distress  HEENT: normal  Neck: no JVD, carotid bruits, or masses Cardiac: II RR with relatively rapid response. There are no murmurs, rubs, or gallops,no edema . Respiratory:  clear to auscultation bilaterally, normal work of breathing GI: soft, nontender, nondistended, + BS MS: no deformity or atrophy  Skin: warm and dry, no rash Neuro:  Alert and Oriented x 3, Strength and sensation are intact Psych: euthymic mood, full affect  Wt Readings from Last 3 Encounters:  08/24/16 280 lb 12.8 oz (127.4 kg)  11/02/15 284 lb (128.8 kg)  10/26/15 284 lb (128.8 kg)      Studies/Labs Reviewed:   EKG:  EKG  Atrial fibrillation with 98 BP minute ventricular response. Compared to prior tracing atrial fibrillation is new  Recent Labs: 10/26/2015: ALT 22 11/04/2015: BUN 25; Creatinine, Ser 0.79; Potassium 4.2; Sodium 140 11/05/2015: Hemoglobin 14.8; Platelets 623   Lipid Panel No results found for: CHOL, TRIG, HDL, CHOLHDL, VLDL, LDLCALC, LDLDIRECT  Additional studies/ records that were reviewed today include:  No new data.    ASSESSMENT:    1. Paroxysmal atrial fibrillation (HCC)   2. Tachycardia-bradycardia syndrome ([place])   3. Essential hypertension   4. Moderate obstructive sleep apnea   5. Chronic anticoagulation   6. Obesity (BMI 30-39.9)      PLAN:  In order of problems listed above:  1. Currently in atrial fibrillation. Duration is unknown. Previously documented to have paroxysmal atrial fib. Under  the circumstances I feel obligated to perform a 48 hour Holter monitor to determine if atrial fib is still paroxysmal or now continuous. If it is continuous, his rate control will need to be improved. 2. Based upon the last Holter study, he has tachybradycardia syndrome with postconversion heart rates into the mid 40s. Further management will depend upon the concurrent evaluation at this time. 3. Very well controlled blood pressure today. 2 g sodium diet is advocated. 4. I advocated resumption of C Pap.  His wife also has sleep apnea and I recommended that she resume therapy as well. 5. Continue with decreased caloric intake cutting back on carbohydrates.  I will need to see the patient in one year. If the monitor demonstrates continuous atrial fibrillation we will need to improve rate control. He may end up needing pacemaker plus rate control. Further recommendations will be dependent upon the 48 hour Holter monitor. He'll be contacted after that study is available with further recommendations.  Medication Adjustments/Labs and Tests Ordered: Current medicines are reviewed at length with the patient today.  Concerns regarding medicines are outlined above.  Medication changes, Labs and Tests ordered today are listed in the Patient Instructions below. There are no Patient Instructions on file for this visit.   Signed, [name], MD  08/24/2016 11:54 AM    [facility] [address], [place], [place]  [address] Phone: [phone]; Fax: [phone]

## 2016-08-31 ENCOUNTER — Ambulatory Visit (INDEPENDENT_AMBULATORY_CARE_PROVIDER_SITE_OTHER): Payer: Medicare Other

## 2016-08-31 DIAGNOSIS — I495 Sick sinus syndrome: Secondary | ICD-10-CM | POA: Diagnosis not present

## 2016-08-31 DIAGNOSIS — I480 Paroxysmal atrial fibrillation: Secondary | ICD-10-CM

## 2016-10-07 DIAGNOSIS — Z471 Aftercare following joint replacement surgery: Secondary | ICD-10-CM | POA: Diagnosis not present

## 2016-10-07 DIAGNOSIS — Z96652 Presence of left artificial knee joint: Secondary | ICD-10-CM | POA: Diagnosis not present

## 2016-10-11 DIAGNOSIS — R69 Illness, unspecified: Secondary | ICD-10-CM | POA: Diagnosis not present

## 2016-10-18 DIAGNOSIS — H5203 Hypermetropia, bilateral: Secondary | ICD-10-CM | POA: Diagnosis not present

## 2016-10-18 DIAGNOSIS — H43813 Vitreous degeneration, bilateral: Secondary | ICD-10-CM | POA: Insufficient documentation

## 2016-10-18 DIAGNOSIS — H2513 Age-related nuclear cataract, bilateral: Secondary | ICD-10-CM | POA: Insufficient documentation

## 2016-10-18 DIAGNOSIS — H52203 Unspecified astigmatism, bilateral: Secondary | ICD-10-CM | POA: Diagnosis not present

## 2016-10-18 DIAGNOSIS — H35373 Puckering of macula, bilateral: Secondary | ICD-10-CM | POA: Insufficient documentation

## 2016-10-18 DIAGNOSIS — Z794 Long term (current) use of insulin: Secondary | ICD-10-CM | POA: Diagnosis not present

## 2016-10-18 DIAGNOSIS — H43393 Other vitreous opacities, bilateral: Secondary | ICD-10-CM | POA: Diagnosis not present

## 2016-10-18 DIAGNOSIS — E113293 Type 2 diabetes mellitus with mild nonproliferative diabetic retinopathy without macular edema, bilateral: Secondary | ICD-10-CM | POA: Diagnosis not present

## 2016-10-18 DIAGNOSIS — H524 Presbyopia: Secondary | ICD-10-CM | POA: Insufficient documentation

## 2016-11-04 DIAGNOSIS — M7741 Metatarsalgia, right foot: Secondary | ICD-10-CM | POA: Diagnosis not present

## 2016-11-04 DIAGNOSIS — E1142 Type 2 diabetes mellitus with diabetic polyneuropathy: Secondary | ICD-10-CM | POA: Diagnosis not present

## 2016-11-07 DIAGNOSIS — L821 Other seborrheic keratosis: Secondary | ICD-10-CM | POA: Diagnosis not present

## 2016-11-07 DIAGNOSIS — Z85828 Personal history of other malignant neoplasm of skin: Secondary | ICD-10-CM | POA: Diagnosis not present

## 2016-11-07 DIAGNOSIS — L719 Rosacea, unspecified: Secondary | ICD-10-CM | POA: Diagnosis not present

## 2016-12-21 DIAGNOSIS — D126 Benign neoplasm of colon, unspecified: Secondary | ICD-10-CM | POA: Diagnosis not present

## 2016-12-21 DIAGNOSIS — E784 Other hyperlipidemia: Secondary | ICD-10-CM | POA: Diagnosis not present

## 2016-12-21 DIAGNOSIS — G4733 Obstructive sleep apnea (adult) (pediatric): Secondary | ICD-10-CM | POA: Diagnosis not present

## 2016-12-21 DIAGNOSIS — I779 Disorder of arteries and arterioles, unspecified: Secondary | ICD-10-CM | POA: Diagnosis not present

## 2016-12-21 DIAGNOSIS — E1142 Type 2 diabetes mellitus with diabetic polyneuropathy: Secondary | ICD-10-CM | POA: Diagnosis not present

## 2016-12-21 DIAGNOSIS — E11319 Type 2 diabetes mellitus with unspecified diabetic retinopathy without macular edema: Secondary | ICD-10-CM | POA: Diagnosis not present

## 2016-12-21 DIAGNOSIS — K210 Gastro-esophageal reflux disease with esophagitis: Secondary | ICD-10-CM | POA: Diagnosis not present

## 2016-12-21 DIAGNOSIS — I480 Paroxysmal atrial fibrillation: Secondary | ICD-10-CM | POA: Diagnosis not present

## 2016-12-21 DIAGNOSIS — E10319 Type 1 diabetes mellitus with unspecified diabetic retinopathy without macular edema: Secondary | ICD-10-CM | POA: Diagnosis not present

## 2016-12-21 DIAGNOSIS — I10 Essential (primary) hypertension: Secondary | ICD-10-CM | POA: Diagnosis not present

## 2017-01-24 DIAGNOSIS — R69 Illness, unspecified: Secondary | ICD-10-CM | POA: Diagnosis not present

## 2017-01-27 DIAGNOSIS — L243 Irritant contact dermatitis due to cosmetics: Secondary | ICD-10-CM | POA: Diagnosis not present

## 2017-02-08 DIAGNOSIS — E1142 Type 2 diabetes mellitus with diabetic polyneuropathy: Secondary | ICD-10-CM | POA: Diagnosis not present

## 2017-02-08 DIAGNOSIS — L243 Irritant contact dermatitis due to cosmetics: Secondary | ICD-10-CM | POA: Diagnosis not present

## 2017-05-08 DIAGNOSIS — R69 Illness, unspecified: Secondary | ICD-10-CM | POA: Diagnosis not present

## 2017-05-11 DIAGNOSIS — R69 Illness, unspecified: Secondary | ICD-10-CM | POA: Diagnosis not present

## 2017-05-17 DIAGNOSIS — R0781 Pleurodynia: Secondary | ICD-10-CM | POA: Diagnosis not present

## 2017-05-17 DIAGNOSIS — E1142 Type 2 diabetes mellitus with diabetic polyneuropathy: Secondary | ICD-10-CM | POA: Diagnosis not present

## 2017-05-17 DIAGNOSIS — I779 Disorder of arteries and arterioles, unspecified: Secondary | ICD-10-CM | POA: Diagnosis not present

## 2017-05-17 DIAGNOSIS — N401 Enlarged prostate with lower urinary tract symptoms: Secondary | ICD-10-CM | POA: Diagnosis not present

## 2017-05-17 DIAGNOSIS — I10 Essential (primary) hypertension: Secondary | ICD-10-CM | POA: Diagnosis not present

## 2017-05-17 DIAGNOSIS — E784 Other hyperlipidemia: Secondary | ICD-10-CM | POA: Diagnosis not present

## 2017-05-17 DIAGNOSIS — G4733 Obstructive sleep apnea (adult) (pediatric): Secondary | ICD-10-CM | POA: Diagnosis not present

## 2017-05-17 DIAGNOSIS — E10319 Type 1 diabetes mellitus with unspecified diabetic retinopathy without macular edema: Secondary | ICD-10-CM | POA: Diagnosis not present

## 2017-05-17 DIAGNOSIS — I480 Paroxysmal atrial fibrillation: Secondary | ICD-10-CM | POA: Diagnosis not present

## 2017-05-17 DIAGNOSIS — Z6839 Body mass index (BMI) 39.0-39.9, adult: Secondary | ICD-10-CM | POA: Diagnosis not present

## 2017-05-23 DIAGNOSIS — Z7984 Long term (current) use of oral hypoglycemic drugs: Secondary | ICD-10-CM | POA: Diagnosis not present

## 2017-05-23 DIAGNOSIS — H524 Presbyopia: Secondary | ICD-10-CM | POA: Diagnosis not present

## 2017-05-23 DIAGNOSIS — H35373 Puckering of macula, bilateral: Secondary | ICD-10-CM | POA: Diagnosis not present

## 2017-05-23 DIAGNOSIS — Z794 Long term (current) use of insulin: Secondary | ICD-10-CM | POA: Diagnosis not present

## 2017-05-23 DIAGNOSIS — E113293 Type 2 diabetes mellitus with mild nonproliferative diabetic retinopathy without macular edema, bilateral: Secondary | ICD-10-CM | POA: Diagnosis not present

## 2017-05-23 DIAGNOSIS — H52203 Unspecified astigmatism, bilateral: Secondary | ICD-10-CM | POA: Diagnosis not present

## 2017-05-23 DIAGNOSIS — H43813 Vitreous degeneration, bilateral: Secondary | ICD-10-CM | POA: Diagnosis not present

## 2017-05-23 DIAGNOSIS — H5203 Hypermetropia, bilateral: Secondary | ICD-10-CM | POA: Diagnosis not present

## 2017-05-23 DIAGNOSIS — H43393 Other vitreous opacities, bilateral: Secondary | ICD-10-CM | POA: Diagnosis not present

## 2017-05-23 DIAGNOSIS — H2513 Age-related nuclear cataract, bilateral: Secondary | ICD-10-CM | POA: Diagnosis not present

## 2017-05-31 DIAGNOSIS — C4491 Basal cell carcinoma of skin, unspecified: Secondary | ICD-10-CM | POA: Diagnosis not present

## 2017-05-31 DIAGNOSIS — D229 Melanocytic nevi, unspecified: Secondary | ICD-10-CM | POA: Diagnosis not present

## 2017-05-31 DIAGNOSIS — L821 Other seborrheic keratosis: Secondary | ICD-10-CM | POA: Diagnosis not present

## 2017-05-31 DIAGNOSIS — L570 Actinic keratosis: Secondary | ICD-10-CM | POA: Diagnosis not present

## 2017-07-27 DIAGNOSIS — M170 Bilateral primary osteoarthritis of knee: Secondary | ICD-10-CM | POA: Diagnosis not present

## 2017-07-27 DIAGNOSIS — Z471 Aftercare following joint replacement surgery: Secondary | ICD-10-CM | POA: Diagnosis not present

## 2017-07-27 DIAGNOSIS — M1711 Unilateral primary osteoarthritis, right knee: Secondary | ICD-10-CM | POA: Diagnosis not present

## 2017-07-27 DIAGNOSIS — M1712 Unilateral primary osteoarthritis, left knee: Secondary | ICD-10-CM | POA: Diagnosis not present

## 2017-07-27 DIAGNOSIS — Z96652 Presence of left artificial knee joint: Secondary | ICD-10-CM | POA: Diagnosis not present

## 2017-08-08 ENCOUNTER — Telehealth: Payer: Self-pay

## 2017-08-08 NOTE — Telephone Encounter (Signed)
    [facility] Pre-operative Risk Assessment    Request for surgical clearance:  1. What type of surgery is being performed? Right TKA-medial and lateral w/wo patella resurfacing   2. When is this surgery scheduled? 11/06/17  3. Are there any medications that need to be held prior to surgery and how long? Please instruct patient on Xarelto prior to surgery   4. Practice name and name of physician performing surgery?  [facility] 2. Dr. Pilar Plate Aluisio  5. What is your office phone and fax number?  1. Phone [phone] 2. Fax [phone] ATTN: Fabio Asa   6. Anesthesia type (None, local, MAC, general) ? Non specified    Teressa Senter 08/08/2017, 11:50 AM  _________________________________________________________________   (provider comments below)

## 2017-08-09 ENCOUNTER — Emergency Department (HOSPITAL_COMMUNITY): Payer: Medicare HMO

## 2017-08-09 ENCOUNTER — Encounter (HOSPITAL_COMMUNITY): Payer: Self-pay | Admitting: Family Medicine

## 2017-08-09 ENCOUNTER — Inpatient Hospital Stay (HOSPITAL_COMMUNITY)
Admission: EM | Admit: 2017-08-09 | Discharge: 2017-08-10 | DRG: 065 | Disposition: A | Discharge status: Home / Self Care | Payer: Medicare HMO | Attending: Family Medicine | Admitting: Family Medicine

## 2017-08-09 DIAGNOSIS — R4701 Aphasia: Secondary | ICD-10-CM | POA: Diagnosis present

## 2017-08-09 DIAGNOSIS — Z9989 Dependence on other enabling machines and devices: Secondary | ICD-10-CM

## 2017-08-09 DIAGNOSIS — Z87891 Personal history of nicotine dependence: Secondary | ICD-10-CM | POA: Diagnosis not present

## 2017-08-09 DIAGNOSIS — G629 Polyneuropathy, unspecified: Secondary | ICD-10-CM | POA: Diagnosis present

## 2017-08-09 DIAGNOSIS — Z96652 Presence of left artificial knee joint: Secondary | ICD-10-CM | POA: Diagnosis present

## 2017-08-09 DIAGNOSIS — G4733 Obstructive sleep apnea (adult) (pediatric): Secondary | ICD-10-CM | POA: Diagnosis present

## 2017-08-09 DIAGNOSIS — R471 Dysarthria and anarthria: Secondary | ICD-10-CM | POA: Diagnosis not present

## 2017-08-09 DIAGNOSIS — I739 Peripheral vascular disease, unspecified: Secondary | ICD-10-CM | POA: Diagnosis present

## 2017-08-09 DIAGNOSIS — R29700 NIHSS score 0: Secondary | ICD-10-CM | POA: Diagnosis present

## 2017-08-09 DIAGNOSIS — E1140 Type 2 diabetes mellitus with diabetic neuropathy, unspecified: Secondary | ICD-10-CM | POA: Diagnosis present

## 2017-08-09 DIAGNOSIS — R911 Solitary pulmonary nodule: Secondary | ICD-10-CM | POA: Diagnosis present

## 2017-08-09 DIAGNOSIS — I63512 Cerebral infarction due to unspecified occlusion or stenosis of left middle cerebral artery: Secondary | ICD-10-CM | POA: Diagnosis present

## 2017-08-09 DIAGNOSIS — I5032 Chronic diastolic (congestive) heart failure: Secondary | ICD-10-CM | POA: Diagnosis present

## 2017-08-09 DIAGNOSIS — E1151 Type 2 diabetes mellitus with diabetic peripheral angiopathy without gangrene: Secondary | ICD-10-CM | POA: Diagnosis present

## 2017-08-09 DIAGNOSIS — I480 Paroxysmal atrial fibrillation: Secondary | ICD-10-CM | POA: Diagnosis not present

## 2017-08-09 DIAGNOSIS — Z7901 Long term (current) use of anticoagulants: Secondary | ICD-10-CM | POA: Diagnosis not present

## 2017-08-09 DIAGNOSIS — E1165 Type 2 diabetes mellitus with hyperglycemia: Secondary | ICD-10-CM | POA: Diagnosis present

## 2017-08-09 DIAGNOSIS — I110 Hypertensive heart disease with heart failure: Secondary | ICD-10-CM | POA: Diagnosis present

## 2017-08-09 DIAGNOSIS — K219 Gastro-esophageal reflux disease without esophagitis: Secondary | ICD-10-CM

## 2017-08-09 DIAGNOSIS — E669 Obesity, unspecified: Secondary | ICD-10-CM | POA: Diagnosis present

## 2017-08-09 DIAGNOSIS — E785 Hyperlipidemia, unspecified: Secondary | ICD-10-CM | POA: Diagnosis not present

## 2017-08-09 DIAGNOSIS — R202 Paresthesia of skin: Secondary | ICD-10-CM | POA: Diagnosis not present

## 2017-08-09 DIAGNOSIS — Z79899 Other long term (current) drug therapy: Secondary | ICD-10-CM

## 2017-08-09 DIAGNOSIS — I10 Essential (primary) hypertension: Secondary | ICD-10-CM | POA: Diagnosis not present

## 2017-08-09 DIAGNOSIS — E119 Type 2 diabetes mellitus without complications: Secondary | ICD-10-CM

## 2017-08-09 DIAGNOSIS — R4781 Slurred speech: Secondary | ICD-10-CM | POA: Diagnosis present

## 2017-08-09 DIAGNOSIS — F419 Anxiety disorder, unspecified: Secondary | ICD-10-CM | POA: Diagnosis present

## 2017-08-09 DIAGNOSIS — I482 Chronic atrial fibrillation: Secondary | ICD-10-CM | POA: Diagnosis present

## 2017-08-09 DIAGNOSIS — I361 Nonrheumatic tricuspid (valve) insufficiency: Secondary | ICD-10-CM | POA: Diagnosis not present

## 2017-08-09 DIAGNOSIS — Z794 Long term (current) use of insulin: Secondary | ICD-10-CM

## 2017-08-09 DIAGNOSIS — I6523 Occlusion and stenosis of bilateral carotid arteries: Secondary | ICD-10-CM | POA: Diagnosis not present

## 2017-08-09 DIAGNOSIS — H538 Other visual disturbances: Secondary | ICD-10-CM | POA: Diagnosis present

## 2017-08-09 DIAGNOSIS — I63312 Cerebral infarction due to thrombosis of left middle cerebral artery: Secondary | ICD-10-CM | POA: Diagnosis not present

## 2017-08-09 DIAGNOSIS — I639 Cerebral infarction, unspecified: Secondary | ICD-10-CM | POA: Diagnosis not present

## 2017-08-09 DIAGNOSIS — Z6839 Body mass index (BMI) 39.0-39.9, adult: Secondary | ICD-10-CM

## 2017-08-09 DIAGNOSIS — G35 Multiple sclerosis: Secondary | ICD-10-CM | POA: Diagnosis not present

## 2017-08-09 LAB — CBC
HCT: 38.8 % — ABNORMAL LOW (ref 39.0–52.0)
HEMOGLOBIN: 13 g/dL (ref 13.0–17.0)
MCH: 29 pg (ref 26.0–34.0)
MCHC: 33.5 g/dL (ref 30.0–36.0)
MCV: 86.6 fL (ref 78.0–100.0)
Platelets: 881 10*3/uL — ABNORMAL HIGH (ref 150–400)
RBC: 4.48 MIL/uL (ref 4.22–5.81)
RDW: 16.6 % — ABNORMAL HIGH (ref 11.5–15.5)
WBC: 11.4 10*3/uL — AB (ref 4.0–10.5)

## 2017-08-09 LAB — I-STAT CHEM 8, ED
BUN: 19 mg/dL (ref 6–20)
CHLORIDE: 100 mmol/L — AB (ref 101–111)
Calcium, Ion: 1.19 mmol/L (ref 1.15–1.40)
Creatinine, Ser: 1.1 mg/dL (ref 0.61–1.24)
Glucose, Bld: 324 mg/dL — ABNORMAL HIGH (ref 65–99)
HEMATOCRIT: 41 % (ref 39.0–52.0)
Hemoglobin: 13.9 g/dL (ref 13.0–17.0)
POTASSIUM: 4.4 mmol/L (ref 3.5–5.1)
SODIUM: 137 mmol/L (ref 135–145)
TCO2: 26 mmol/L (ref 22–32)

## 2017-08-09 LAB — DIFFERENTIAL
BASOS PCT: 2 %
Basophils Absolute: 0.2 10*3/uL — ABNORMAL HIGH (ref 0.0–0.1)
EOS ABS: 0.3 10*3/uL (ref 0.0–0.7)
Eosinophils Relative: 3 %
LYMPHS ABS: 2.5 10*3/uL (ref 0.7–4.0)
Lymphocytes Relative: 22 %
Monocytes Absolute: 0.8 10*3/uL (ref 0.1–1.0)
Monocytes Relative: 7 %
NEUTROS ABS: 7.5 10*3/uL (ref 1.7–7.7)
NEUTROS PCT: 66 %

## 2017-08-09 LAB — COMPREHENSIVE METABOLIC PANEL
ALBUMIN: 3.7 g/dL (ref 3.5–5.0)
ALT: 24 U/L (ref 17–63)
AST: 25 U/L (ref 15–41)
Alkaline Phosphatase: 88 U/L (ref 38–126)
Anion gap: 6 (ref 5–15)
BUN: 20 mg/dL (ref 6–20)
CHLORIDE: 103 mmol/L (ref 101–111)
CO2: 26 mmol/L (ref 22–32)
Calcium: 9 mg/dL (ref 8.9–10.3)
Creatinine, Ser: 1.15 mg/dL (ref 0.61–1.24)
GFR calc Af Amer: >60 mL/min (ref >60)
GFR calc non Af Amer: >60 mL/min (ref >60)
GLUCOSE: 313 mg/dL — AB (ref 65–99)
POTASSIUM: 4.4 mmol/L (ref 3.5–5.1)
Sodium: 135 mmol/L (ref 135–145)
Total Bilirubin: 0.6 mg/dL (ref 0.3–1.2)
Total Protein: 6.6 g/dL (ref 6.5–8.1)

## 2017-08-09 LAB — CBG MONITORING, ED: Glucose-Capillary: 303 mg/dL — ABNORMAL HIGH (ref 65–99)

## 2017-08-09 LAB — APTT: aPTT: 35 seconds (ref 24–36)

## 2017-08-09 LAB — I-STAT TROPONIN, ED: Troponin i, poc: 0 ng/mL (ref 0.00–0.08)

## 2017-08-09 LAB — PROTIME-INR
INR: 1.15
Prothrombin Time: 14.6 seconds (ref 11.4–15.2)

## 2017-08-09 MED ORDER — HYDRALAZINE HCL 20 MG/ML IJ SOLN: 5.0000 mg | INTRAMUSCULAR | Status: DC | PRN

## 2017-08-09 MED ORDER — IOPAMIDOL (ISOVUE-370) INJECTION 76%
INTRAVENOUS | Status: AC
  Administered 2017-08-09: 100 mL via INTRAVENOUS
  Filled 2017-08-09: qty 100

## 2017-08-09 MED ORDER — GABAPENTIN 300 MG PO CAPS
300.0000 mg | ORAL_CAPSULE | Freq: Every day | ORAL | Status: DC
  Administered 2017-08-10: 300 mg via ORAL
  Filled 2017-08-09: qty 1

## 2017-08-09 MED ORDER — PANTOPRAZOLE SODIUM 40 MG PO TBEC
40.0000 mg | DELAYED_RELEASE_TABLET | Freq: Every day | ORAL | Status: DC
  Administered 2017-08-10: 40 mg via ORAL
  Filled 2017-08-09: qty 1

## 2017-08-09 MED ORDER — NERVE PAIN RELIEF SL SUBL: 1.0000 | SUBLINGUAL_TABLET | Freq: Two times a day (BID) | SUBLINGUAL | Status: DC

## 2017-08-09 MED ORDER — VENLAFAXINE HCL ER 75 MG PO CP24
75.0000 mg | ORAL_CAPSULE | Freq: Every day | ORAL | Status: DC
  Administered 2017-08-10: 75 mg via ORAL
  Filled 2017-08-09: qty 1

## 2017-08-09 MED ORDER — ZOLPIDEM TARTRATE 5 MG PO TABS: 5.0000 mg | ORAL_TABLET | Freq: Every evening | ORAL | Status: DC | PRN

## 2017-08-09 MED ORDER — ADULT MULTIVITAMIN W/MINERALS CH
1.0000 | ORAL_TABLET | Freq: Every day | ORAL | Status: DC
  Administered 2017-08-10: 1 via ORAL
  Filled 2017-08-09: qty 1

## 2017-08-09 MED ORDER — ATORVASTATIN CALCIUM 80 MG PO TABS
80.0000 mg | ORAL_TABLET | Freq: Every day | ORAL | Status: DC
  Administered 2017-08-10: 80 mg via ORAL
  Filled 2017-08-09: qty 1

## 2017-08-09 MED ORDER — INSULIN ASPART 100 UNIT/ML ~~LOC~~ SOLN
0.0000 [IU] | Freq: Three times a day (TID) | SUBCUTANEOUS | Status: DC
  Administered 2017-08-10 (×2): 3 [IU] via SUBCUTANEOUS
  Administered 2017-08-10: 5 [IU] via SUBCUTANEOUS

## 2017-08-09 MED ORDER — INSULIN ASPART PROT & ASPART (70-30 MIX) 100 UNIT/ML ~~LOC~~ SUSP
30.0000 [IU] | Freq: Two times a day (BID) | SUBCUTANEOUS | Status: DC
  Administered 2017-08-10 (×2): 30 [IU] via SUBCUTANEOUS
  Filled 2017-08-09: qty 10

## 2017-08-09 MED ORDER — IOPAMIDOL (ISOVUE-370) INJECTION 76%
75.0000 mL | Freq: Once | INTRAVENOUS | Status: AC | PRN
  Administered 2017-08-09: 100 mL via INTRAVENOUS

## 2017-08-09 MED ORDER — ONDANSETRON HCL 4 MG/2ML IJ SOLN: 4.0000 mg | Freq: Three times a day (TID) | INTRAMUSCULAR | Status: DC | PRN

## 2017-08-09 MED ORDER — SENNOSIDES-DOCUSATE SODIUM 8.6-50 MG PO TABS: 1.0000 | ORAL_TABLET | Freq: Every evening | ORAL | Status: DC | PRN

## 2017-08-09 MED ORDER — ALPRAZOLAM 0.5 MG PO TABS
0.5000 mg | ORAL_TABLET | Freq: Two times a day (BID) | ORAL | Status: DC
  Administered 2017-08-10 (×2): 0.5 mg via ORAL
  Filled 2017-08-09 (×2): qty 1

## 2017-08-09 MED ORDER — ACETAMINOPHEN 325 MG PO TABS: 650.0000 mg | ORAL_TABLET | ORAL | Status: DC | PRN

## 2017-08-09 MED ORDER — ACETAMINOPHEN 160 MG/5ML PO SOLN: 650.0000 mg | ORAL | Status: DC | PRN

## 2017-08-09 MED ORDER — RIVAROXABAN 20 MG PO TABS: 20.0000 mg | ORAL_TABLET | Freq: Every day | ORAL | Status: DC

## 2017-08-09 MED ORDER — FUROSEMIDE 40 MG PO TABS
20.0000 mg | ORAL_TABLET | Freq: Every day | ORAL | Status: DC
  Administered 2017-08-10: 20 mg via ORAL
  Filled 2017-08-09: qty 1

## 2017-08-09 MED ORDER — STROKE: EARLY STAGES OF RECOVERY BOOK
Freq: Once | Status: AC
  Administered 2017-08-10: 15:00:00
  Filled 2017-08-09 (×2): qty 1

## 2017-08-09 MED ORDER — ACETAMINOPHEN 650 MG RE SUPP: 650.0000 mg | RECTAL | Status: DC | PRN

## 2017-08-09 NOTE — H&P (Signed)
History and Physical    [name]:[id number] DOB: 1940/11/01 DOA: 08/09/2017  Referring MD/NP/PA:   PCP: [name], MD   Patient coming from:  The patient is coming from home.  At baseline, pt is partially dependent for most of ADL.   Chief Complaint: Slurred speech, difficulty speaking, blurry vision, right arm tingling  HPI: [name] is a 76 y.o. male with medical history significant of hypertension, hyperlipidemia, diabetes mellitus, GERD, anxiety, OSA not on CPAP, PAF on Xarelto, PVD, dCHF, who presents with slurred speech, difficulty speaking, blurry vision and right arm numbness.  Pt states that his symptoms started him this morning. He has a slurred speech, difficulty speaking, blurry vision and the left arm tingling. His blurry vision and arm tingling has resolved after only a few seconds. But he still has difficulty speaking and slurred speech. No leg weakness, hearing loss. Patient denies chest pain, cough, SOB, symptoms of UTI. No nausea, vomiting, diarrhea or abdominal pain. No fever or chills.  ED Course: pt was found to have WBC 11.4, INR 1.15, PTT 35, pending urinalysis, creatinine 1.10, heart rate 90s, O2 sat 97% on room air, temperature normal. CTA showed left MCA territory infarct and left M2 stenosis.  Patient is admitted to telemetry bed as inpatient. Neurology, Dr. Leonel Ramsay was consulted.  Review of Systems:   General: no fevers, chills, no body weight gain, has poor appetite, has fatigue HEENT: no blurry vision, hearing changes or sore throat Respiratory: no dyspnea, coughing, wheezing CV: no chest pain, no palpitations GI: no nausea, vomiting, abdominal pain, diarrhea, constipation GU: no dysuria, burning on urination, increased urinary frequency, hematuria  Ext: has leg edema Neuro: Slurred speech, difficulty speaking, blurry vision, right arm tingling Skin: no rash, no skin tear. MSK: No muscle spasm, no deformity, no limitation of range  of movement in spin Heme: No easy bruising.  Travel history: No recent long distant travel.  Allergy: No Known Allergies  Past Medical History:  Diagnosis Date  . Anxiety   . Arthritis   . Cancer (HCC)    skin - basil cell  . Depression   . Diabetes mellitus without complication ([place])   . Dysrhythmia    a-fib  . GERD (gastroesophageal reflux disease)   . Hyperlipidemia   . Hypertension   . Neuropathy   . Obesity   . Paroxysmal atrial fibrillation (HCC)   . Peripheral vascular disease ([place])    diabetic neuropathy in both feet  . Sleep apnea    uses C-pap machine    Past Surgical History:  Procedure Laterality Date  . APPENDECTOMY  1962  . BACK SURGERY  00-02-12   x3  . BASAL CELL CARCINOMA EXCISION  93/06/10  . COLONOSCOPY    . KNEE ARTHROSCOPY  005/01/02  . 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   Procedure: TOTAL LEFT KNEE ARTHROPLASTY;  Surgeon: [name], MD;  Location: [facility];  Service: Orthopedics;  Laterality: Left;    Social History:  reports that he quit smoking about 25 years ago. His smoking use included cigars. he has never used smokeless tobacco. He reports that he drinks alcohol. He reports that he does not use drugs.  Family History:  Family History  Problem Relation Age of Onset  . Cancer Mother   . Diabetes Mellitus II Mother   . Hypertension Mother   . Heart failure Father   . CVA Father   . Hypertension Sister   . Colon cancer Neg Hx  Prior to Admission medications   Medication Sig Start Date End Date Taking? Authorizing Provider  ALPRAZolam Duanne Moron) 0.5 MG tablet Take 0.5 mg by mouth 2 (two) times daily.    Yes [provider]  benazepril (LOTENSIN) 40 MG tablet Take 40 mg by mouth daily.   Yes [provider]  furosemide (LASIX) 20 MG tablet Take 20 mg by mouth daily.    Yes [provider]  gabapentin (NEURONTIN) 300 MG capsule Take 300 mg by mouth at bedtime.    Yes [provider]    Homeopathic Products (NERVE PAIN RELIEF SL) Place 1 tablet under the tongue 2 (two) times daily.   Yes [provider]  insulin NPH-regular Human (NOVOLIN 70/30) (70-30) 100 UNIT/ML injection Inject 44 Units into the skin 2 (two) times daily with a meal.   Yes [provider]  metFORMIN (GLUCOPHAGE) 500 MG tablet Take 500 mg by mouth 2 (two) times daily with a meal.    Yes [provider]  Multiple Vitamins-Minerals (MULTIVITAMIN ADULT PO) Take 1 tablet by mouth daily.   Yes [provider]  omeprazole (PRILOSEC) 20 MG capsule Take 20 mg by mouth daily.   Yes [provider]  rivaroxaban (XARELTO) 20 MG TABS tablet Take 1 tablet (20 mg total) by mouth daily with supper. 08/24/16  Yes Belva Crome, MD  simvastatin (ZOCOR) 40 MG tablet Take 40 mg by mouth daily.   Yes [provider]  venlafaxine XR (EFFEXOR-XR) 75 MG 24 hr capsule Take 75 mg by mouth daily.   Yes [provider]  Canagliflozin (INVOKANA) 100 MG TABS Take 50 mg by mouth 2 (two) times daily.     [provider]  Cholecalciferol (VITAMIN D3) 50000 units CAPS Take 1 capsule by mouth 2 (two) times a week.    [provider]  insulin aspart protamine-insulin aspart (NOVOLOG 70/30) (70-30) 100 UNIT/ML injection Inject 46-48 Units into the skin 2 (two) times daily with a meal. 46 in the morning and 48 in the evening    [provider]    Physical Exam: Vitals:   08/10/17 0030 08/10/17 0032 08/10/17 0254 08/10/17 0300  BP: (!) 124/51 (!) 124/51 (!) 143/70 (!) 140/57  Pulse: (!) 58 64 65 (!) 59  Resp: (!) 21 16 15 17   Temp:      TempSrc:      SpO2: 97% 98% 99% 97%  Weight:      Height:       General: Not in acute distress HEENT:       Eyes: PERRL, EOMI, no scleral icterus.       ENT: No discharge from the ears and nose, no pharynx injection, no tonsillar enlargement.        Neck: No JVD, no bruit, no mass felt. Heme: No neck lymph node  enlargement. Cardiac: S1/S2, RRR, No murmurs, No gallops or rubs. Respiratory: No rales, wheezing, rhonchi or rubs. GI: Soft, nondistended, nontender, no rebound pain, no organomegaly, BS present. GU: No hematuria Ext: 1+ pitting leg edema bilaterally. 2+DP/PT pulse bilaterally. Musculoskeletal: No joint deformities, No joint redness or warmth, no limitation of ROM in spin. Skin: No rashes.  Neuro: Alert, oriented X3, cranial nerves II-XII grossly intact, moves all extremities normally. Psych: Patient is not psychotic, no suicidal or hemocidal ideation.  Labs on Admission: I have personally reviewed following labs and imaging studies  CBC: Recent Labs  Lab 08/09/17 2045 08/09/17 2102  WBC 11.4*  --  NEUTROABS 7.5  --   HGB 13.0 13.9  HCT 38.8* 41.0  MCV 86.6  --   PLT 881*  --    Basic Metabolic Panel: Recent Labs  Lab 08/09/17 2045 08/09/17 2102  NA 135 137  K 4.4 4.4  CL 103 100*  CO2 26  --   GLUCOSE 313* 324*  BUN 20 19  CREATININE 1.15 1.10  CALCIUM 9.0  --    GFR: Estimated Creatinine Clearance: 82.7 mL/min (by C-G formula based on SCr of 1.1 mg/dL). Liver Function Tests: Recent Labs  Lab 08/09/17 2045  AST 25  ALT 24  ALKPHOS 88  BILITOT 0.6  PROT 6.6  ALBUMIN 3.7   No results for input(s): LIPASE, AMYLASE in the last 168 hours. No results for input(s): AMMONIA in the last 168 hours. Coagulation Profile: Recent Labs  Lab 08/09/17 2045  INR 1.15   Cardiac Enzymes: No results for input(s): CKTOTAL, CKMB, CKMBINDEX, TROPONINI in the last 168 hours. BNP (last 3 results) No results for input(s): PROBNP in the last 8760 hours. HbA1C: No results for input(s): HGBA1C in the last 72 hours. CBG: Recent Labs  Lab 08/09/17 2055  GLUCAP 303*   Lipid Profile: No results for input(s): CHOL, HDL, LDLCALC, TRIG, CHOLHDL, LDLDIRECT in the last 72 hours. Thyroid Function Tests: No results for input(s): TSH, T4TOTAL, FREET4, T3FREE, THYROIDAB in the last  72 hours. Anemia Panel: No results for input(s): VITAMINB12, FOLATE, FERRITIN, TIBC, IRON, RETICCTPCT in the last 72 hours. Urine analysis:    Component Value Date/Time   COLORURINE YELLOW 10/26/2015 1013   APPEARANCEUR CLEAR 10/26/2015 1013   LABSPEC 1.017 10/26/2015 1013   PHURINE 5.0 10/26/2015 1013   GLUCOSEU >1000 (A) 10/26/2015 1013   HGBUR NEGATIVE 10/26/2015 [place] 10/26/2015 1013   KETONESUR NEGATIVE 10/26/2015 1013   PROTEINUR NEGATIVE 10/26/2015 1013   NITRITE NEGATIVE 10/26/2015 1013   LEUKOCYTESUR NEGATIVE 10/26/2015 1013   Sepsis Labs: @LABRCNTIP (procalcitonin:4,lacticidven:4) )No results found for this or any previous visit (from the past 240 hour(s)).   Radiological Exams on Admission: Ct Angio Head W Or Wo Contrast  Result Date: 08/09/2017 CLINICAL DATA:  Slurred speech, word-finding difficulties, intermittent RIGHT arm tingling and blurry vision for 1-2 days. Assess TIA. History of hypertension, hyperlipidemia, diabetes. EXAM: CT ANGIOGRAPHY HEAD AND NECK TECHNIQUE: Multidetector CT imaging of the head and neck was performed using the standard protocol during bolus administration of intravenous contrast. Multiplanar CT image reconstructions and MIPs were obtained to evaluate the vascular anatomy. Carotid stenosis measurements (when applicable) are obtained utilizing NASCET criteria, using the distal internal carotid diameter as the denominator. CONTRAST:  75 cc Isovue 370 COMPARISON:  MRI of the head July 23, 2010 FINDINGS: CT HEAD FINDINGS BRAIN: No intraparenchymal hemorrhage, mass effect nor midline shift. Small area LEFT frontal convexity blurring of the gray-white matter differentiation. Moderate parenchymal brain volume loss, no hydrocephalus. Patchy to confluent supratentorial white matter hypodensities. Old bilateral basal ganglia lacunar infarcts and/or prominent perivascular spaces associated with chronic small vessel ischemic disease.  VASCULAR: Moderate calcific atherosclerosis of the carotid siphons. SKULL: No skull fracture. No significant scalp soft tissue swelling. SINUSES/ORBITS: Mild paranasal sinus mucosal thickening. Mastoid air cells are well aerated.The included ocular globes and orbital contents are non-suspicious. OTHER: None. CTA NECK AORTIC ARCH: Normal appearance of the thoracic arch, 2 vessel arch is a normal variant. Mild calcific atherosclerosis. The origins of the innominate, left Common carotid artery and subclavian artery are widely patent. RIGHT CAROTID  SYSTEM: Common carotid artery is widely patent, coursing in a straight line fashion. Mild eccentric calcific atherosclerosis carotid bifurcation without hemodynamically significant stenosis by NASCET criteria. Normal appearance of the internal carotid artery. LEFT CAROTID SYSTEM: Common carotid artery is widely patent, coursing in a straight line fashion. Shelf-like intimal thickening calcific atherosclerosis at origin resulting in less than 50% stenosis by NASCET criteria. Tortuous patent internal carotid artery. VERTEBRAL ARTERIES:Severe stenosis RIGHT vertebral artery origin. Patent codominant vertebral artery's. SKELETON: No acute osseous process though bone windows have not been submitted. OTHER NECK: Soft tissues of the neck are nonacute though, not tailored for evaluation. UPPER CHEST: 7 mm solid RIGHT upper lobe pulmonary nodule (series 10, image 7/186) CTA HEAD ANTERIOR CIRCULATION: Patent cervical internal carotid arteries, petrous, cavernous and supra clinoid internal carotid arteries. Calcific atherosclerosis resulting in mild stenosis bilateral supraclinoid internal carotid artery's. Patent anterior communicating artery. Patent anterior and middle cerebral arteries. Moderate stenosis proximal LEFT M2 superior division. Mild luminal irregularity of the anterior and middle cerebral artery's compatible with atherosclerosis. No large vessel occlusion, significant  stenosis, contrast extravasation or aneurysm. POSTERIOR CIRCULATION: Patent vertebral arteries, vertebrobasilar junction and basilar artery, as well as main branch vessels. Patent posterior cerebral arteries. Moderate tandem stenosis RIGHT P1 and P2 segments. Mild luminal irregularity bilateral posterior cerebral artery's compatible with atherosclerosis. No large vessel occlusion, significant stenosis, contrast extravasation or aneurysm. VENOUS SINUSES: Major dural venous sinuses are patent though not tailored for evaluation on this angiographic examination. ANATOMIC VARIANTS: None. DELAYED PHASE: No abnormal intracranial enhancement. MIP images reviewed. IMPRESSION: CT HEAD: 1. 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 2. 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 3. Moderate parenchymal brain volume loss. CTA NECK: 1. Atherosclerosis without hemodynamically significant stenosis of the carotid artery's. No acute vascular process. 2. Severe stenosis RIGHT vertebral artery origin. 3. **An incidental finding of potential clinical significance has been found. 7 mm RIGHT upper lobe pulmonary nodule. Non-contrast chest CT at 6-12 months is recommended. If the nodule is stable at time of repeat CT, then future CT at 18-24 months (from today's scan) is considered optional for low-risk patients, but is recommended for high-risk patients. This recommendation follows the consensus statement: Guidelines for Management of Incidental Pulmonary Nodules Detected on CT Images: From the Fleischner Society 2017; Radiology 2017; 284:228-243.** CTA HEAD: 1. No emergent large vessel occlusion or severe stenosis. 2. Atherosclerosis. Moderate stenoses LEFT M2 segment, proximal RIGHT posterior cerebral artery. Acute findings discussed with and reconfirmed by [facility] JAMES on 08/09/2017 at 10:55 pm. Aortic Atherosclerosis (ICD10-I70.0). Electronically Signed   By: Elon Alas  M.D.   On: 08/09/2017 22:56   Ct Angio Neck W And/or Wo Contrast  Result Date: 08/09/2017 CLINICAL DATA:  Slurred speech, word-finding difficulties, intermittent RIGHT arm tingling and blurry vision for 1-2 days. Assess TIA. History of hypertension, hyperlipidemia, diabetes. EXAM: CT ANGIOGRAPHY HEAD AND NECK TECHNIQUE: Multidetector CT imaging of the head and neck was performed using the standard protocol during bolus administration of intravenous contrast. Multiplanar CT image reconstructions and MIPs were obtained to evaluate the vascular anatomy. Carotid stenosis measurements (when applicable) are obtained utilizing NASCET criteria, using the distal internal carotid diameter as the denominator. CONTRAST:  75 cc Isovue 370 COMPARISON:  MRI of the head July 23, 2010 FINDINGS: CT HEAD FINDINGS BRAIN: No intraparenchymal hemorrhage, mass effect nor midline shift. Small area LEFT frontal convexity blurring of the gray-white matter differentiation. Moderate parenchymal brain volume loss, no hydrocephalus. Patchy to confluent  supratentorial white matter hypodensities. Old bilateral basal ganglia lacunar infarcts and/or prominent perivascular spaces associated with chronic small vessel ischemic disease. VASCULAR: Moderate calcific atherosclerosis of the carotid siphons. SKULL: No skull fracture. No significant scalp soft tissue swelling. SINUSES/ORBITS: Mild paranasal sinus mucosal thickening. Mastoid air cells are well aerated.The included ocular globes and orbital contents are non-suspicious. OTHER: None. CTA NECK AORTIC ARCH: Normal appearance of the thoracic arch, 2 vessel arch is a normal variant. Mild calcific atherosclerosis. The origins of the innominate, left Common carotid artery and subclavian artery are widely patent. RIGHT CAROTID SYSTEM: Common carotid artery is widely patent, coursing in a straight line fashion. Mild eccentric calcific atherosclerosis carotid bifurcation without hemodynamically  significant stenosis by NASCET criteria. Normal appearance of the internal carotid artery. LEFT CAROTID SYSTEM: Common carotid artery is widely patent, coursing in a straight line fashion. Shelf-like intimal thickening calcific atherosclerosis at origin resulting in less than 50% stenosis by NASCET criteria. Tortuous patent internal carotid artery. VERTEBRAL ARTERIES:Severe stenosis RIGHT vertebral artery origin. Patent codominant vertebral artery's. SKELETON: No acute osseous process though bone windows have not been submitted. OTHER NECK: Soft tissues of the neck are nonacute though, not tailored for evaluation. UPPER CHEST: 7 mm solid RIGHT upper lobe pulmonary nodule (series 10, image 7/186) CTA HEAD ANTERIOR CIRCULATION: Patent cervical internal carotid arteries, petrous, cavernous and supra clinoid internal carotid arteries. Calcific atherosclerosis resulting in mild stenosis bilateral supraclinoid internal carotid artery's. Patent anterior communicating artery. Patent anterior and middle cerebral arteries. Moderate stenosis proximal LEFT M2 superior division. Mild luminal irregularity of the anterior and middle cerebral artery's compatible with atherosclerosis. No large vessel occlusion, significant stenosis, contrast extravasation or aneurysm. POSTERIOR CIRCULATION: Patent vertebral arteries, vertebrobasilar junction and basilar artery, as well as main branch vessels. Patent posterior cerebral arteries. Moderate tandem stenosis RIGHT P1 and P2 segments. Mild luminal irregularity bilateral posterior cerebral artery's compatible with atherosclerosis. No large vessel occlusion, significant stenosis, contrast extravasation or aneurysm. VENOUS SINUSES: Major dural venous sinuses are patent though not tailored for evaluation on this angiographic examination. ANATOMIC VARIANTS: None. DELAYED PHASE: No abnormal intracranial enhancement. MIP images reviewed. IMPRESSION: CT HEAD: 1. Acute small LEFT frontal lobe/MCA  territory nonhemorrhagic infarct. 2. 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 3. Moderate parenchymal brain volume loss. CTA NECK: 1. Atherosclerosis without hemodynamically significant stenosis of the carotid artery's. No acute vascular process. 2. Severe stenosis RIGHT vertebral artery origin. 3. **An incidental finding of potential clinical significance has been found. 7 mm RIGHT upper lobe pulmonary nodule. Non-contrast chest CT at 6-12 months is recommended. If the nodule is stable at time of repeat CT, then future CT at 18-24 months (from today's scan) is considered optional for low-risk patients, but is recommended for high-risk patients. This recommendation follows the consensus statement: Guidelines for Management of Incidental Pulmonary Nodules Detected on CT Images: From the Fleischner Society 2017; Radiology 2017; 284:228-243.** CTA HEAD: 1. No emergent large vessel occlusion or severe stenosis. 2. Atherosclerosis. Moderate stenoses LEFT M2 segment, proximal RIGHT posterior cerebral artery. Acute findings discussed with and reconfirmed by [facility] JAMES on 08/09/2017 at 10:55 pm. Aortic Atherosclerosis (ICD10-I70.0). Electronically Signed   By: Elon Alas M.D.   On: 08/09/2017 22:56     EKG: Independently reviewed.  Atrial fibrillation, QTC 447 Assessment/Plan Principal Problem:   Stroke (cerebrum) (HCC) Active Problems:   Diabetes mellitus type 2, insulin dependent (HCC)   GERD   HTN (hypertension)   Paroxysmal atrial fibrillation ([place])   Long  term current use of anticoagulant therapy   HLD (hyperlipidemia)   Anxiety   Chronic diastolic CHF (congestive heart failure) ([place])   Stroke: CTA showed left MCA territory infarct and left M2 stenosis.  Neurology, Dr. Leonel Ramsay was consulted--> recommended is to hold xarelto and start aspirin. - will admit to tele bed as inpt  - will follow up Neurology's Recs.  - Obtain MRI of brain - ASA (hold  Xarelto) - switch zocor to lipitor - fasting lipid panel and HbA1c  - 2D transthoracic echocardiography  - PT/OT consult  Chronic diastolic CHF: 2-D echo on 03/11/14 showed EF of 55-60 percent with grade 1 diastolic dysfunction. Patient has 1+ leg edema bilaterally, but no respiratory distress. CHF seems to be compensated. -Continue home Lasix 20 mg daily -Check BNP  Diabetes mellitus type 2, insulin dependent ([place]): Last A1c not on record. Patient is taking 70/30 insulin, metformin, Invokana at home -will decrease 70/30 insulin dose from 46-48 to 30 units bid -SSI -Check A1c  GERD: -Protonix  HTN: -hold home bp meds to allow permissive hypertension, except for Lasix -IV hydralazine when necessary for SBP>220  Paroxysmal atrial fibrillation [facility]): CHA2DS2-VASc Score is 6, needs oral anticoagulation. Patient is on Xarelto at home. Heart rate is well controlled. -hold Xarelto per neurologist recommendation in the setting of acute stroke -Telemetry monitor  HLD (hyperlipidemia): -switched zocor to lipitor  Anxiety: -prn xanax   DVT ppx: SCD Code Status: Full code Family Communication:Yes, patient's  Wife at bed side Disposition Plan:  Anticipate discharge back to previous home environment Consults called:  Neurology, Dr. Leonel Ramsay Admission status:   Inpatient/tele     Date of Service 08/10/2017    [facility] Pager [phone]  If 7PM-7AM, please contact night-coverage [email or web address] Password [email or web address] 08/10/2017, 3:54 AM

## 2017-08-09 NOTE — Telephone Encounter (Signed)
    Chart reviewed as part of pre-operative protocol coverage. Because of [name] past medical history and time since last visit, he/she will require a follow-up visit in order to better assess preoperative cardiovascular risk. Their primary cardiologist is Dr. Tamala Julian.  Pre-op covering staff: - Please schedule appointment and call patient to inform them. - Please contact requesting surgeon's office via preferred method (i.e, phone, fax) to inform them of need for appointment prior to surgery.  [place], [place]  08/09/2017, 3:14 PM

## 2017-08-09 NOTE — ED Provider Notes (Signed)
[facility] DEPT Provider Note   CSN: [id number] Arrival date & time: 08/09/17  1933     History   Chief Complaint Chief Complaint  Patient presents with  . Numbness  . Aphasia    HPI [name] is a 76 y.o. male. Chief complaint is "episodes of right arm tingling, and difficulty speaking"  HPI:  76 year old male. He reports symptoms starting yesterday, 11/27 at about noon. He knows he had "tingling and numbness" of his right arm. This has been intermittent since that time lasting anywhere from a few minutes to less than an hour at a time. Today at 8 AM, noon, and again tonight around 6 PM he developed difficulty speaking. He describes this as "not able to get words out". His wife noticed the most 2 recent episodes. He went to urgent care. He declined transport here but was referred to the emergency room and presents POV.  History of PAF and coagulated with Eliquis, and not on chronic medication for rate control . History of hypertension. Nonsmoker. No history of known peripheral vascular, coronary artery, or cerebrovascular disease. No previous TIAs.  Past Medical History:  Diagnosis Date  . Anxiety   . Arthritis   . Cancer (HCC)    skin - basil cell  . Depression   . Diabetes mellitus without complication ([place])   . Dysrhythmia    a-fib  . GERD (gastroesophageal reflux disease)   . Hyperlipidemia   . Hypertension   . Neuropathy   . Obesity   . Paroxysmal atrial fibrillation (HCC)   . Peripheral vascular disease ([place])    diabetic neuropathy in both feet  . Sleep apnea    uses C-pap machine    Patient Active Problem List   Diagnosis Date Noted  . 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 HLD (hyperlipidemia) 08/09/2017  . OA (osteoarthritis) of knee 11/02/2015  . Hx of adenomatous colonic polyps 11/03/2014  . Paroxysmal atrial fibrillation ([place]) 11/03/2014  . Long term current use of anticoagulant therapy 11/03/2014  . Obesity (BMI  30-39.9) 07/15/2014  . HTN (hypertension) 07/15/2014  . Chronic anticoagulation 06/20/2014  . 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 Moderate obstructive sleep apnea 03/04/2014  . DUODENITIS WITHOUT MENTION OF HEMORRHAGE 06/24/2009  . 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 GERD 05/22/2009    Past Surgical History:  Procedure Laterality Date  . APPENDECTOMY  1962  . BACK SURGERY  00-02-12   x3  . BASAL CELL CARCINOMA EXCISION  93/06/10  . COLONOSCOPY    . KNEE ARTHROSCOPY  005/01/02  . 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   Procedure: TOTAL LEFT KNEE ARTHROPLASTY;  Surgeon: [name], MD;  Location: [facility];  Service: Orthopedics;  Laterality: Left;       Home Medications    Prior to Admission medications   Medication Sig Start Date End Date Taking? Authorizing Provider  ALPRAZolam Duanne Moron) 0.5 MG tablet Take 0.5 mg by mouth 2 (two) times daily.    Yes [provider]  benazepril (LOTENSIN) 40 MG tablet Take 40 mg by mouth daily.   Yes [provider]  furosemide (LASIX) 20 MG tablet Take 20 mg by mouth daily.    Yes [provider]  gabapentin (NEURONTIN) 300 MG capsule Take 300 mg by mouth at bedtime.    Yes [provider]  Homeopathic Products (NERVE PAIN RELIEF SL) Place 1 tablet under the tongue 2 (two) times daily.   Yes [provider]  insulin NPH-regular Human (NOVOLIN 70/30) (70-30) 100 UNIT/ML injection Inject 44 Units into the skin 2 (two) times daily with a meal.   Yes [provider]  metFORMIN (GLUCOPHAGE) 500 MG tablet Take 500 mg by mouth 2 (two) times daily with a meal.    Yes [provider]  Multiple Vitamins-Minerals (MULTIVITAMIN ADULT PO) Take 1 tablet by mouth daily.   Yes [provider]  omeprazole (PRILOSEC) 20 MG capsule Take 20 mg by mouth daily.   Yes [provider]  rivaroxaban (XARELTO) 20 MG TABS tablet Take 1 tablet (20 mg  total) by mouth daily with supper. 08/24/16  Yes Belva Crome, MD  simvastatin (ZOCOR) 40 MG tablet Take 40 mg by mouth daily.   Yes [provider]  venlafaxine XR (EFFEXOR-XR) 75 MG 24 hr capsule Take 75 mg by mouth daily.   Yes [provider]  Canagliflozin (INVOKANA) 100 MG TABS Take 50 mg by mouth 2 (two) times daily.     [provider]  Cholecalciferol (VITAMIN D3) 50000 units CAPS Take 1 capsule by mouth 2 (two) times a week.    [provider]  insulin aspart protamine-insulin aspart (NOVOLOG 70/30) (70-30) 100 UNIT/ML injection Inject 46-48 Units into the skin 2 (two) times daily with a meal. 46 in the morning and 48 in the evening    [provider]    Family History Family History  Problem Relation Age of Onset  . Cancer Mother   . Diabetes Mellitus II Mother   . Hypertension Mother   . Heart failure Father   . CVA Father   . Hypertension Sister   . Colon cancer Neg Hx     Social History Social History   Tobacco Use  . Smoking status: Former Smoker    Types: Cigars    Last attempt to quit: 08/02/1992    Years since quitting: 25.0  . Smokeless tobacco: Never Used  Substance Use Topics  . Alcohol use: Yes    Alcohol/week: 0.0 oz    Comment: 1 beer a night.   . Drug use: No     Allergies   Patient has no known allergies.   Review of Systems Review of Systems  Constitutional: Negative for appetite change, chills, diaphoresis, fatigue and fever.  HENT: Negative for mouth sores, sore throat and trouble swallowing.   Eyes: Negative for visual disturbance.  Respiratory: Negative for cough, chest tightness, shortness of breath and wheezing.   Cardiovascular: Negative for chest pain.  Gastrointestinal: Negative for abdominal distention, abdominal pain, diarrhea, nausea and vomiting.  Endocrine: Negative for polydipsia, polyphagia and polyuria.  Genitourinary: Negative for dysuria, frequency and hematuria.    Musculoskeletal: Negative for gait problem.  Skin: Negative for color change, pallor and rash.  Neurological: Positive for speech difficulty and numbness. Negative for dizziness, syncope, light-headedness and headaches.  Hematological: Does not bruise/bleed easily.  Psychiatric/Behavioral: Negative for behavioral problems and confusion.     Physical Exam Updated Vital Signs BP (!) 143/86   Pulse 96   Temp 98.1 F (36.7 C) (Oral)   Resp 18   Ht 6' (1.829 m)   Wt 131.5 kg (290 lb)   SpO2 98%   BMI 39.33 kg/m   Physical Exam  Constitutional: He is oriented to person, place, and time. He appears well-developed and well-nourished. No distress.  HENT:  Head: Normocephalic.  Eyes: Conjunctivae are normal. Pupils are equal, round, and reactive to light. No scleral icterus.  Neck: Normal range of motion. Neck supple. No thyromegaly present.  Cardiovascular: Normal rate and regular rhythm. Exam reveals no gallop and no friction rub.  No murmur heard. Pulmonary/Chest: Effort normal and breath sounds normal. No respiratory distress. He has no wheezes. He has no rales.  Abdominal: Soft. Bowel sounds are normal. He exhibits no distension. There is no tenderness. There is no rebound.  Musculoskeletal: Normal range of motion.  Neurological: He is alert and oriented to person, place, and time.  No pronator drift. No leg drift. No cranial nerve deficits. No visual field deficits. Fluent speech. Reports normal sensation 4  Skin: Skin is warm and dry. No rash noted.  Psychiatric: He has a normal mood and affect. His behavior is normal.     ED Treatments / Results  Labs (all labs ordered are listed, but only abnormal results are displayed) Labs Reviewed  CBC - Abnormal; Notable for the following components:      Result Value   WBC 11.4 (*)    HCT 38.8 (*)    RDW 16.6 (*)    Platelets 881 (*)    All other components within normal limits  DIFFERENTIAL - Abnormal; Notable for the  following components:   Basophils Absolute 0.2 (*)    All other components within normal limits  COMPREHENSIVE METABOLIC PANEL - Abnormal; Notable for the following components:   Glucose, Bld 313 (*)    All other components within normal limits  CBG MONITORING, ED - Abnormal; Notable for the following components:   Glucose-Capillary 303 (*)    All other components within normal limits  I-STAT CHEM 8, ED - Abnormal; Notable for the following components:   Chloride 100 (*)    Glucose, Bld 324 (*)    All other components within normal limits  PROTIME-INR  APTT  I-STAT TROPONIN, ED  I-STAT CHEM 8, ED    EKG  EKG Interpretation None       Radiology Ct Angio Head W Or Wo Contrast  Result Date: 08/09/2017 CLINICAL DATA:  Slurred speech, word-finding difficulties, intermittent RIGHT arm tingling and blurry vision for 1-2 days. Assess TIA. History of hypertension, hyperlipidemia, diabetes. EXAM: CT ANGIOGRAPHY HEAD AND NECK TECHNIQUE: Multidetector CT imaging of the head and neck was performed using the standard protocol during bolus administration of intravenous contrast. Multiplanar CT image reconstructions and MIPs were obtained to evaluate the vascular anatomy. Carotid stenosis measurements (when applicable) are obtained utilizing NASCET criteria, using the distal internal carotid diameter as the denominator. CONTRAST:  75 cc Isovue 370 COMPARISON:  MRI of the head July 23, 2010 FINDINGS: CT HEAD FINDINGS BRAIN: No intraparenchymal hemorrhage, mass effect nor midline shift. Small area LEFT frontal convexity blurring of the gray-white matter differentiation. Moderate parenchymal brain volume loss, no hydrocephalus. Patchy to confluent supratentorial white matter hypodensities. Old bilateral basal ganglia lacunar infarcts and/or prominent perivascular spaces associated with chronic small vessel ischemic disease. VASCULAR: Moderate calcific atherosclerosis of the carotid siphons. SKULL: No  skull fracture. No significant scalp soft tissue swelling. SINUSES/ORBITS: Mild paranasal sinus mucosal thickening. Mastoid air cells are well aerated.The included ocular globes and orbital contents are non-suspicious. OTHER: None. CTA NECK AORTIC ARCH: Normal appearance of the thoracic arch, 2 vessel arch is a normal variant. Mild calcific atherosclerosis. The origins of the innominate, left Common carotid artery and subclavian artery are widely patent. RIGHT CAROTID SYSTEM: Common carotid artery is widely patent, coursing in a straight line fashion. Mild eccentric calcific atherosclerosis carotid bifurcation without hemodynamically significant  stenosis by NASCET criteria. Normal appearance of the internal carotid artery. LEFT CAROTID SYSTEM: Common carotid artery is widely patent, coursing in a straight line fashion. Shelf-like intimal thickening calcific atherosclerosis at origin resulting in less than 50% stenosis by NASCET criteria. Tortuous patent internal carotid artery. VERTEBRAL ARTERIES:Severe stenosis RIGHT vertebral artery origin. Patent codominant vertebral artery's. SKELETON: No acute osseous process though bone windows have not been submitted. OTHER NECK: Soft tissues of the neck are nonacute though, not tailored for evaluation. UPPER CHEST: 7 mm solid RIGHT upper lobe pulmonary nodule (series 10, image 7/186) CTA HEAD ANTERIOR CIRCULATION: Patent cervical internal carotid arteries, petrous, cavernous and supra clinoid internal carotid arteries. Calcific atherosclerosis resulting in mild stenosis bilateral supraclinoid internal carotid artery's. Patent anterior communicating artery. Patent anterior and middle cerebral arteries. Moderate stenosis proximal LEFT M2 superior division. Mild luminal irregularity of the anterior and middle cerebral artery's compatible with atherosclerosis. No large vessel occlusion, significant stenosis, contrast extravasation or aneurysm. POSTERIOR CIRCULATION: Patent  vertebral arteries, vertebrobasilar junction and basilar artery, as well as main branch vessels. Patent posterior cerebral arteries. Moderate tandem stenosis RIGHT P1 and P2 segments. Mild luminal irregularity bilateral posterior cerebral artery's compatible with atherosclerosis. No large vessel occlusion, significant stenosis, contrast extravasation or aneurysm. VENOUS SINUSES: Major dural venous sinuses are patent though not tailored for evaluation on this angiographic examination. ANATOMIC VARIANTS: None. DELAYED PHASE: No abnormal intracranial enhancement. MIP images reviewed. IMPRESSION: CT HEAD: 1. 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 2. 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 3. Moderate parenchymal brain volume loss. CTA NECK: 1. Atherosclerosis without hemodynamically significant stenosis of the carotid artery's. No acute vascular process. 2. Severe stenosis RIGHT vertebral artery origin. 3. **An incidental finding of potential clinical significance has been found. 7 mm RIGHT upper lobe pulmonary nodule. Non-contrast chest CT at 6-12 months is recommended. If the nodule is stable at time of repeat CT, then future CT at 18-24 months (from today's scan) is considered optional for low-risk patients, but is recommended for high-risk patients. This recommendation follows the consensus statement: Guidelines for Management of Incidental Pulmonary Nodules Detected on CT Images: From the Fleischner Society 2017; Radiology 2017; 284:228-243.** CTA HEAD: 1. No emergent large vessel occlusion or severe stenosis. 2. Atherosclerosis. Moderate stenoses LEFT M2 segment, proximal RIGHT posterior cerebral artery. Acute findings discussed with and reconfirmed by [facility] Hermie Reagor on 08/09/2017 at 10:55 pm. Aortic Atherosclerosis (ICD10-I70.0). Electronically Signed   By: Elon Alas M.D.   On: 08/09/2017 22:56   Ct Angio Neck W And/or Wo Contrast  Result  Date: 08/09/2017 CLINICAL DATA:  Slurred speech, word-finding difficulties, intermittent RIGHT arm tingling and blurry vision for 1-2 days. Assess TIA. History of hypertension, hyperlipidemia, diabetes. EXAM: CT ANGIOGRAPHY HEAD AND NECK TECHNIQUE: Multidetector CT imaging of the head and neck was performed using the standard protocol during bolus administration of intravenous contrast. Multiplanar CT image reconstructions and MIPs were obtained to evaluate the vascular anatomy. Carotid stenosis measurements (when applicable) are obtained utilizing NASCET criteria, using the distal internal carotid diameter as the denominator. CONTRAST:  75 cc Isovue 370 COMPARISON:  MRI of the head July 23, 2010 FINDINGS: CT HEAD FINDINGS BRAIN: No intraparenchymal hemorrhage, mass effect nor midline shift. Small area LEFT frontal convexity blurring of the gray-white matter differentiation. Moderate parenchymal brain volume loss, no hydrocephalus. Patchy to confluent supratentorial white matter hypodensities. Old bilateral basal ganglia lacunar infarcts and/or prominent perivascular spaces associated with chronic small vessel ischemic disease. VASCULAR:  Moderate calcific atherosclerosis of the carotid siphons. SKULL: No skull fracture. No significant scalp soft tissue swelling. SINUSES/ORBITS: Mild paranasal sinus mucosal thickening. Mastoid air cells are well aerated.The included ocular globes and orbital contents are non-suspicious. OTHER: None. CTA NECK AORTIC ARCH: Normal appearance of the thoracic arch, 2 vessel arch is a normal variant. Mild calcific atherosclerosis. The origins of the innominate, left Common carotid artery and subclavian artery are widely patent. RIGHT CAROTID SYSTEM: Common carotid artery is widely patent, coursing in a straight line fashion. Mild eccentric calcific atherosclerosis carotid bifurcation without hemodynamically significant stenosis by NASCET criteria. Normal appearance of the internal  carotid artery. LEFT CAROTID SYSTEM: Common carotid artery is widely patent, coursing in a straight line fashion. Shelf-like intimal thickening calcific atherosclerosis at origin resulting in less than 50% stenosis by NASCET criteria. Tortuous patent internal carotid artery. VERTEBRAL ARTERIES:Severe stenosis RIGHT vertebral artery origin. Patent codominant vertebral artery's. SKELETON: No acute osseous process though bone windows have not been submitted. OTHER NECK: Soft tissues of the neck are nonacute though, not tailored for evaluation. UPPER CHEST: 7 mm solid RIGHT upper lobe pulmonary nodule (series 10, image 7/186) CTA HEAD ANTERIOR CIRCULATION: Patent cervical internal carotid arteries, petrous, cavernous and supra clinoid internal carotid arteries. Calcific atherosclerosis resulting in mild stenosis bilateral supraclinoid internal carotid artery's. Patent anterior communicating artery. Patent anterior and middle cerebral arteries. Moderate stenosis proximal LEFT M2 superior division. Mild luminal irregularity of the anterior and middle cerebral artery's compatible with atherosclerosis. No large vessel occlusion, significant stenosis, contrast extravasation or aneurysm. POSTERIOR CIRCULATION: Patent vertebral arteries, vertebrobasilar junction and basilar artery, as well as main branch vessels. Patent posterior cerebral arteries. Moderate tandem stenosis RIGHT P1 and P2 segments. Mild luminal irregularity bilateral posterior cerebral artery's compatible with atherosclerosis. No large vessel occlusion, significant stenosis, contrast extravasation or aneurysm. VENOUS SINUSES: Major dural venous sinuses are patent though not tailored for evaluation on this angiographic examination. ANATOMIC VARIANTS: None. DELAYED PHASE: No abnormal intracranial enhancement. MIP images reviewed. IMPRESSION: CT HEAD: 1. 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 2. Moderate chronic small vessel ischemic  disease and old lacunar infarcts. 3. Moderate parenchymal brain volume loss. CTA NECK: 1. Atherosclerosis without hemodynamically significant stenosis of the carotid artery's. No acute vascular process. 2. Severe stenosis RIGHT vertebral artery origin. 3. **An incidental finding of potential clinical significance has been found. 7 mm RIGHT upper lobe pulmonary nodule. Non-contrast chest CT at 6-12 months is recommended. If the nodule is stable at time of repeat CT, then future CT at 18-24 months (from today's scan) is considered optional for low-risk patients, but is recommended for high-risk patients. This recommendation follows the consensus statement: Guidelines for Management of Incidental Pulmonary Nodules Detected on CT Images: From the Fleischner Society 2017; Radiology 2017; 284:228-243.** CTA HEAD: 1. No emergent large vessel occlusion or severe stenosis. 2. Atherosclerosis. Moderate stenoses LEFT M2 segment, proximal RIGHT posterior cerebral artery. Acute findings discussed with and reconfirmed by [facility] Freeda Spivey on 08/09/2017 at 10:55 pm. Aortic Atherosclerosis (ICD10-I70.0). Electronically Signed   By: Elon Alas M.D.   On: 08/09/2017 22:56    Procedures Procedures (including critical care time)  Medications Ordered in ED Medications  iopamidol (ISOVUE-370) 76 % injection 75 mL (100 mLs Intravenous Contrast Given 08/09/17 2209)     Initial Impression / Assessment and Plan / ED Course  I have reviewed the triage vital signs and the nursing notes.  Pertinent labs & imaging results that were available during my care of the  patient were reviewed by me and considered in my medical decision making (see chart for details).    CT scan shows left small frontal acute stroke. Care had been discussed between myself, and Dr. Leonel Ramsay of neurology prior to patient's CT. He presents here asymptomatic. With his episodes of a fascia this is likely watershed area near his speech center of his  left frontal lobe. Care discussed with Dr. Blaine Hamper, of [facility] hospitalist. He is here planning admission. Patient's EKG shows A. fib. He states he is compliant with his Eliquis. Patient not made a code stroke is a symptomatically on arrival. Patient not a little candidate due to current anticoagulation and Low NIH score/asymptomatic.  Final Clinical Impressions(s) / ED Diagnoses   Final diagnoses:  Cerebral infarction due to thrombosis of left middle cerebral artery [facility])    ED Discharge Orders    None       Tanna Furry, MD 08/09/17 2328

## 2017-08-09 NOTE — ED Triage Notes (Addendum)
Patient was seen at [facility] earlier today and referred for further evaluation for possible TIA. Fast [facility] work reports patient has had slurred speech, trouble word finding, intermittent tingling in right arm, blurred vision. Patient stated to [facility] that his neurological changes seem to be improving with some continued difficulty with word findings. Currently, he states he started experiencing right arm numbness tingling yesterday. Today, he developed slurred speech and unable find the words around 8:00am this morning. However, while writing this note, he has constantly talked with a clear speech and able to have conversation. Patient refused EMS transport from [place].

## 2017-08-09 NOTE — Telephone Encounter (Signed)
lmtcb

## 2017-08-10 ENCOUNTER — Inpatient Hospital Stay (HOSPITAL_COMMUNITY): Payer: Medicare HMO

## 2017-08-10 DIAGNOSIS — I361 Nonrheumatic tricuspid (valve) insufficiency: Secondary | ICD-10-CM

## 2017-08-10 DIAGNOSIS — I63312 Cerebral infarction due to thrombosis of left middle cerebral artery: Principal | ICD-10-CM

## 2017-08-10 DIAGNOSIS — I6300 Cerebral infarction due to thrombosis of unspecified precerebral artery: Secondary | ICD-10-CM

## 2017-08-10 LAB — ECHOCARDIOGRAM COMPLETE
CHL CUP RV SYS PRESS: 20 mmHg
CHL CUP TV REG PEAK VELOCITY: 205 cm/s
E decel time: 180 msec
EERAT: 8.83
FS: 42 % (ref 28–44)
Height: 72 in
IV/PV OW: 1.17
LA ID, A-P, ES: 53 mm
LA diam end sys: 53 mm
LA vol A4C: 79.9 ml
LA vol index: 33.3 mL/m2
LA vol: 87.9 mL
LADIAMINDEX: 2.01 cm/m2
LV E/e'average: 8.83
LV TDI E'LATERAL: 12
LV TDI E'MEDIAL: 13.3
LVEEMED: 8.83
LVELAT: 12 cm/s
LVOT VTI: 24.8 cm
LVOT area: 4.91 cm2
LVOT diameter: 25 mm
LVOTPV: 103 cm/s
LVOTSV: 122 mL
MV Dec: 180
MV pk A vel: 72 m/s
MVPG: 4 mmHg
MVPKEVEL: 106 m/s
PW: 11.6 mm — AB (ref 0.6–1.1)
RV LATERAL S' VELOCITY: 16 cm/s
TAPSE: 26.5 mm
TRMAXVEL: 205 cm/s
WEIGHTICAEL: 4640 [oz_av]

## 2017-08-10 LAB — GLUCOSE, CAPILLARY
GLUCOSE-CAPILLARY: 241 mg/dL — AB (ref 65–99)
Glucose-Capillary: 235 mg/dL — ABNORMAL HIGH (ref 65–99)
Glucose-Capillary: 220 mg/dL — ABNORMAL HIGH (ref 65–99)
Glucose-Capillary: 274 mg/dL — ABNORMAL HIGH (ref 65–99)

## 2017-08-10 LAB — HEMOGLOBIN A1C
HEMOGLOBIN A1C: 7.8 % — AB (ref 4.8–5.6)
MEAN PLASMA GLUCOSE: 177.16 mg/dL

## 2017-08-10 MED ORDER — APIXABAN 5 MG PO TABS: 5.0000 mg | ORAL_TABLET | Freq: Two times a day (BID) | ORAL | Status: DC

## 2017-08-10 MED ORDER — ASPIRIN 325 MG PO TABS
325.0000 mg | ORAL_TABLET | Freq: Every day | ORAL | Status: DC
  Administered 2017-08-10: 325 mg via ORAL
  Filled 2017-08-10 (×2): qty 1

## 2017-08-10 MED ORDER — APIXABAN 5 MG PO TABS: 5.0000 mg | ORAL_TABLET | Freq: Two times a day (BID) | ORAL | 0 refills | Status: DC

## 2017-08-10 MED ORDER — ATORVASTATIN CALCIUM 80 MG PO TABS: 80.0000 mg | ORAL_TABLET | Freq: Every day | ORAL | 0 refills | Status: AC

## 2017-08-10 MED ORDER — BENAZEPRIL HCL 40 MG PO TABS: 40.0000 mg | ORAL_TABLET | Freq: Every day | ORAL | Status: DC

## 2017-08-10 NOTE — Progress Notes (Signed)
Pt off the floor during his 9:40 vitals and neuro assessment.  Will continue to monitor once patient back on the unit.  Cori Razor, RN

## 2017-08-10 NOTE — ED Notes (Signed)
[facility] here to transport patient.  

## 2017-08-10 NOTE — ED Notes (Signed)
Neurology at bedside.

## 2017-08-10 NOTE — Consult Note (Addendum)
Neurology Consultation Reason for Consult: Speech Difficulty Referring Physician: Mora Bellman  CC: Difficulty Speaking  History is obtained from:patient  HPI: [name] is a 76 y.o. male with a history of afib on Xarelto who has been having difficulty speaking since awakening this morning. He states that it seems worse at times, butthese episodes of worsening are only for a few seconds. He has also had two episodes of right sided numbness lasting a few seconds as well. 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    LKW: 11/27 prior to bed.  tpa given?: no, out of window.     ROS: A 14 point ROS was performed and is negative except as noted in the HPI.   Past Medical History:  Diagnosis Date  . Anxiety   . Arthritis   . Cancer (HCC)    skin - basil cell  . Depression   . Diabetes mellitus without complication ([place])   . Dysrhythmia    a-fib  . GERD (gastroesophageal reflux disease)   . Hyperlipidemia   . Hypertension   . Neuropathy   . Obesity   . Paroxysmal atrial fibrillation (HCC)   . Peripheral vascular disease ([place])    diabetic neuropathy in both feet  . Sleep apnea    uses C-pap machine     Family History  Problem Relation Age of Onset  . Cancer Mother   . Diabetes Mellitus II Mother   . Hypertension Mother   . Heart failure Father   . CVA Father   . Hypertension Sister   . Colon cancer Neg Hx      Social History:  reports that he quit smoking about 25 years ago. His smoking use included cigars. he has never used smokeless tobacco. He reports that he drinks alcohol. He reports that he does not use drugs.   Exam: Current vital signs: BP (!) 140/57   Pulse (!) 59   Temp 98.1 F (36.7 C)   Resp 17   Ht 6' (1.829 m)   Wt 131.5 kg (290 lb)   SpO2 97%   BMI 39.33 kg/m  Vital signs in last 24 hours: Temp:  [98.1 F (36.7 C)] 98.1 F (36.7 C) (11/29 0006) Pulse Rate:  [58-96] 59 (11/29  0300) Resp:  [15-21] 17 (11/29 0300) BP: (124-157)/(51-95) 140/57 (11/29 0300) SpO2:  [97 %-99 %] 97 % (11/29 0300) Weight:  [131.5 kg (290 lb)] 131.5 kg (290 lb) (11/28 2028)   Physical Exam  Constitutional: Appears well-developed and well-nourished.  Psych: Affect appropriate to situation Eyes: No scleral injection HENT: No OP obstrucion Head: Normocephalic.  Cardiovascular: Normal rate and regular rhythm.  Respiratory: Effort normal and breath sounds normal to anterior ascultation GI: Soft.  No distension. There is no tenderness.  Skin: WDI  Neuro: Mental Status: Patient is awake, alert, oriented to person, place, month, year, and situation. Patient is able to give a clear and coherent history. He has a mild expressive aphasia.  Repetition is intact. Cranial Nerves: II: Visual Fields are full. Pupils are equal, round, and reactive to light.   III,IV, VI: EOMI without ptosis or diploplia.  V: Facial sensation is symmetric to temperature VII: Facial movement is symmetric.  VIII: hearing is intact to voice X: Uvula elevates symmetrically XI: Shoulder shrug is symmetric. XII: tongue is midline without atrophy or fasciculations.  Motor: Tone is normal. Bulk is normal. 5/5 strength was present in  all four extremities.  Sensory: Sensation is symmetric to light touch and temperature in the arms and legs. Plantars: Toes are downgoing bilaterally.  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  Possible etiologies include  Breakthrough from his A. fib despite Xarelto versus artery to artery from his M2 stenosis.  He will need speech therapy as well as further workup.  Recommendations: 1. HgbA1c, fasting lipid  panel 2. MRI of the brain without contrast 3. Frequent neuro checks 4. Echocardiogram 5. Permissive hypertension 6. Prophylactic therapy-Antiplatelet med: Aspirin - dose 325mg  PO or 300mg  PR 7. Risk factor modification 8. Telemetry monitoring 9. PT consult, OT consult, Speech consult 10. please page stroke NP  Or  PA  Or MD  from 8am -4 pm as this patient will be followed by the stroke team at this point.   You can look them up on [email or web address]      Roland Rack, MD [facility] [phone]  If 7pm- 7am, please page neurology on call as listed in [place].

## 2017-08-10 NOTE — Discharge Summary (Signed)
Physician Discharge Summary  [name]:[id number] DOB: [birth year] DOA: 08/09/2017  PCP: [name], MD  Admit date: 08/09/2017 Discharge date: 08/10/2017  Admitted From: Home Disposition: Home  Recommendations for Outpatient Follow-up:  1. Follow up with PCP in 1 week 2. Please obtain BMP/CBC in one week 3. Please obtain fasting lipid panel 4. Please follow up on the following pending results: Hemoglobin A1C  Home Health: None Equipment/Devices: None  Discharge Condition: Stable CODE STATUS: Full code Diet recommendation: Heart healthy   Brief/Interim Summary:  Admission HPI written by Ivor Costa, MD   Chief Complaint: Slurred speech, difficulty speaking, blurry vision, right arm tingling  HPI: [name] is a 76 y.o. male with medical history significant of hypertension, hyperlipidemia, diabetes mellitus, GERD, anxiety, OSA not on CPAP, PAF on Xarelto, PVD, dCHF, who presents with slurred speech, difficulty speaking, blurry vision and right arm numbness.  Pt states that his symptoms started him this morning. He has a slurred speech, difficulty speaking, blurry vision and the left arm tingling. His blurry vision and arm tingling has resolved after only a few seconds. But he still has difficulty speaking and slurred speech. No leg weakness, hearing loss. Patient denies chest pain, cough, SOB, symptoms of UTI. No nausea, vomiting, diarrhea or abdominal pain. No fever or chills.  ED Course: pt was found to have WBC 11.4, INR 1.15, PTT 35, pending urinalysis, creatinine 1.10, heart rate 90s, O2 sat 97% on room air, temperature normal. CTA showed left MCA territory infarct and left M2 stenosis. Patient is admitted to telemetry bed as inpatient. Neurology, Dr. Leonel Ramsay was consulted.    Hospital course:  Stroke MRI significant for acute small left frontal lobe/MCA territory infarct. Non hemorrhagic. CTA head/neck significant for moderate stenosis of eft M2  segment and right posterior cerebral artery. Physical and occupational therapy recommended no outpatient follow-up. Hemoglobin A1C of 7.8% and unable to obtain LDL secondary to issues with transfer and timing of lab draws. He will need outpatient fasting lipid panel and adjust statin as indicated; goal LDL <70. Echocardiogram significant for an EF of 55-60% Likely cause is embolic per neurology assessment in setting of patient taking Xarelto with inconsistent dinner portions/timing during the day. He has been transitioned to Eliquis. Follow-up with neurology in 6 weeks.  Chronic diastolic heart failure Stable. Continue Lasix  Diabetes mellitus, type 2, insulin dependent 70/30 and sliding scale insulin while inpatient. Hemoglobin A1C of 7.8%. Resume home regimen on discharge.  GERD Protonix  Essential hypertension Held antihypertensives to allow for permissive hypertension. Restart as an outpatient in 4 days.  Paroxysmal atrial fibrillation Rate controlled. Discontinued Xarelto and started on Eliquis  Hyperlipidemia Discontinued Zocor and started Lipitor. Will need LDL checked as an outpatient as this was unable to be performed inpatient.  Anxiety Continued Xanax.  Pulmonary nodule Incidental. Repeat non-contrast chest CT in 6-12 months. This was discussed with the patient.   Discharge Diagnoses:  Principal Problem:   Stroke (cerebrum) ([place]) Active Problems:   Diabetes mellitus type 2, insulin dependent (HCC)   GERD   HTN (hypertension)   Paroxysmal atrial fibrillation (HCC)   Long term current use of anticoagulant therapy   HLD (hyperlipidemia)   Anxiety   Chronic diastolic CHF (congestive heart failure) [facility])    Discharge Instructions  Discharge Instructions    Ambulatory referral to Neurology   Complete by:  As directed    An appointment is requested in approximately: 6 weeks Follow up with stroke clinic (Dr Leonie Man  preferred, if not available, then consider Caesar Chestnut,  [facility] or Jaynee Eagles whoever is available) at [facility] in about 6-8 weeks. Thanks.   Call MD for:  difficulty breathing, headache or visual disturbances   Complete by:  As directed    Call MD for:  persistant dizziness or light-headedness   Complete by:  As directed    Diet - low sodium heart healthy   Complete by:  As directed    Increase activity slowly   Complete by:  As directed      Allergies as of 08/10/2017   No Known Allergies     Medication List    STOP taking these medications   insulin aspart protamine- aspart (70-30) 100 UNIT/ML injection Commonly known as:  NOVOLOG MIX 70/30   rivaroxaban 20 MG Tabs tablet Commonly known as:  XARELTO   simvastatin 40 MG tablet Commonly known as:  ZOCOR     TAKE these medications   ALPRAZolam 0.5 MG tablet Commonly known as:  XANAX Take 0.5 mg by mouth 2 (two) times daily.   apixaban 5 MG Tabs tablet Commonly known as:  ELIQUIS Take 1 tablet (5 mg total) by mouth 2 (two) times daily.   atorvastatin 80 MG tablet Commonly known as:  LIPITOR Take 1 tablet (80 mg total) by mouth daily at 6 PM. Start taking on:  08/11/2017   benazepril 40 MG tablet Commonly known as:  LOTENSIN Take 1 tablet (40 mg total) by mouth daily. Start taking on:  08/14/2017 What changed:  These instructions start on 08/14/2017. If you are unsure what to do until then, ask your doctor or other care provider.   furosemide 20 MG tablet Commonly known as:  LASIX Take 20 mg by mouth daily.   gabapentin 300 MG capsule Commonly known as:  NEURONTIN Take 300 mg by mouth at bedtime.   insulin NPH-regular Human (70-30) 100 UNIT/ML injection Commonly known as:  NOVOLIN 70/30 Inject 44 Units into the skin 2 (two) times daily with a meal.   INVOKANA 100 MG Tabs tablet Generic drug:  canagliflozin Take 50 mg by mouth 2 (two) times daily.   metFORMIN 500 MG tablet Commonly known as:  GLUCOPHAGE Take 500 mg by mouth 2 (two) times daily with a meal.    MULTIVITAMIN ADULT PO Take 1 tablet by mouth daily.   NERVE PAIN RELIEF SL Place 1 tablet under the tongue 2 (two) times daily.   omeprazole 20 MG capsule Commonly known as:  PRILOSEC Take 20 mg by mouth daily.   venlafaxine XR 75 MG 24 hr capsule Commonly known as:  EFFEXOR-XR Take 75 mg by mouth daily.   Vitamin D3 50000 units Caps Take 1 capsule by mouth 2 (two) times a week.      Follow-up Information    Garvin Fila, MD. Schedule an appointment as soon as possible for a visit in 6 week(s).   Specialties:  Neurology, Radiology Contact information: [address] [place] [address] [phone]        [name], MD. Schedule an appointment as soon as possible for a visit in 1 week(s).   Specialty:  Endocrinology Contact information: [address] [place] [address] [phone]          No Known Allergies  Consultations:  Neurology/Stroke team   Procedures/Studies: Ct Angio Head W Or Wo Contrast  Result Date: 08/09/2017 CLINICAL DATA:  Slurred speech, word-finding difficulties, intermittent RIGHT arm tingling and blurry vision for 1-2 days.  Assess TIA. History of hypertension, hyperlipidemia, diabetes. EXAM: CT ANGIOGRAPHY HEAD AND NECK TECHNIQUE: Multidetector CT imaging of the head and neck was performed using the standard protocol during bolus administration of intravenous contrast. Multiplanar CT image reconstructions and MIPs were obtained to evaluate the vascular anatomy. Carotid stenosis measurements (when applicable) are obtained utilizing NASCET criteria, using the distal internal carotid diameter as the denominator. CONTRAST:  75 cc Isovue 370 COMPARISON:  MRI of the head July 23, 2010 FINDINGS: CT HEAD FINDINGS BRAIN: No intraparenchymal hemorrhage, mass effect nor midline shift. Small area LEFT frontal convexity blurring of the gray-white matter differentiation. Moderate parenchymal brain volume loss, no  hydrocephalus. Patchy to confluent supratentorial white matter hypodensities. Old bilateral basal ganglia lacunar infarcts and/or prominent perivascular spaces associated with chronic small vessel ischemic disease. VASCULAR: Moderate calcific atherosclerosis of the carotid siphons. SKULL: No skull fracture. No significant scalp soft tissue swelling. SINUSES/ORBITS: Mild paranasal sinus mucosal thickening. Mastoid air cells are well aerated.The included ocular globes and orbital contents are non-suspicious. OTHER: None. CTA NECK AORTIC ARCH: Normal appearance of the thoracic arch, 2 vessel arch is a normal variant. Mild calcific atherosclerosis. The origins of the innominate, left Common carotid artery and subclavian artery are widely patent. RIGHT CAROTID SYSTEM: Common carotid artery is widely patent, coursing in a straight line fashion. Mild eccentric calcific atherosclerosis carotid bifurcation without hemodynamically significant stenosis by NASCET criteria. Normal appearance of the internal carotid artery. LEFT CAROTID SYSTEM: Common carotid artery is widely patent, coursing in a straight line fashion. Shelf-like intimal thickening calcific atherosclerosis at origin resulting in less than 50% stenosis by NASCET criteria. Tortuous patent internal carotid artery. VERTEBRAL ARTERIES:Severe stenosis RIGHT vertebral artery origin. Patent codominant vertebral artery's. SKELETON: No acute osseous process though bone windows have not been submitted. OTHER NECK: Soft tissues of the neck are nonacute though, not tailored for evaluation. UPPER CHEST: 7 mm solid RIGHT upper lobe pulmonary nodule (series 10, image 7/186) CTA HEAD ANTERIOR CIRCULATION: Patent cervical internal carotid arteries, petrous, cavernous and supra clinoid internal carotid arteries. Calcific atherosclerosis resulting in mild stenosis bilateral supraclinoid internal carotid artery's. Patent anterior communicating artery. Patent anterior and middle  cerebral arteries. Moderate stenosis proximal LEFT M2 superior division. Mild luminal irregularity of the anterior and middle cerebral artery's compatible with atherosclerosis. No large vessel occlusion, significant stenosis, contrast extravasation or aneurysm. POSTERIOR CIRCULATION: Patent vertebral arteries, vertebrobasilar junction and basilar artery, as well as main branch vessels. Patent posterior cerebral arteries. Moderate tandem stenosis RIGHT P1 and P2 segments. Mild luminal irregularity bilateral posterior cerebral artery's compatible with atherosclerosis. No large vessel occlusion, significant stenosis, contrast extravasation or aneurysm. VENOUS SINUSES: Major dural venous sinuses are patent though not tailored for evaluation on this angiographic examination. ANATOMIC VARIANTS: None. DELAYED PHASE: No abnormal intracranial enhancement. MIP images reviewed. IMPRESSION: CT HEAD: 1. 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 2. 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 3. Moderate parenchymal brain volume loss. CTA NECK: 1. Atherosclerosis without hemodynamically significant stenosis of the carotid artery's. No acute vascular process. 2. Severe stenosis RIGHT vertebral artery origin. 3. **An incidental finding of potential clinical significance has been found. 7 mm RIGHT upper lobe pulmonary nodule. Non-contrast chest CT at 6-12 months is recommended. If the nodule is stable at time of repeat CT, then future CT at 18-24 months (from today's scan) is considered optional for low-risk patients, but is recommended for high-risk patients. This recommendation follows the consensus statement: Guidelines for Management of Incidental  Pulmonary Nodules Detected on CT Images: From the Fleischner Society 2017; Radiology 2017; [id number].** CTA HEAD: 1. No emergent large vessel occlusion or severe stenosis. 2. Atherosclerosis. Moderate stenoses LEFT M2 segment,  proximal RIGHT posterior cerebral artery. Acute findings discussed with and reconfirmed by [facility] JAMES on 08/09/2017 at 10:55 pm. Aortic Atherosclerosis (ICD10-I70.0). Electronically Signed   By: Elon Alas M.D.   On: 08/09/2017 22:56   Ct Angio Neck W And/or Wo Contrast  Result Date: 08/09/2017 CLINICAL DATA:  Slurred speech, word-finding difficulties, intermittent RIGHT arm tingling and blurry vision for 1-2 days. Assess TIA. History of hypertension, hyperlipidemia, diabetes. EXAM: CT ANGIOGRAPHY HEAD AND NECK TECHNIQUE: Multidetector CT imaging of the head and neck was performed using the standard protocol during bolus administration of intravenous contrast. Multiplanar CT image reconstructions and MIPs were obtained to evaluate the vascular anatomy. Carotid stenosis measurements (when applicable) are obtained utilizing NASCET criteria, using the distal internal carotid diameter as the denominator. CONTRAST:  75 cc Isovue 370 COMPARISON:  MRI of the head July 23, 2010 FINDINGS: CT HEAD FINDINGS BRAIN: No intraparenchymal hemorrhage, mass effect nor midline shift. Small area LEFT frontal convexity blurring of the gray-white matter differentiation. Moderate parenchymal brain volume loss, no hydrocephalus. Patchy to confluent supratentorial white matter hypodensities. Old bilateral basal ganglia lacunar infarcts and/or prominent perivascular spaces associated with chronic small vessel ischemic disease. VASCULAR: Moderate calcific atherosclerosis of the carotid siphons. SKULL: No skull fracture. No significant scalp soft tissue swelling. SINUSES/ORBITS: Mild paranasal sinus mucosal thickening. Mastoid air cells are well aerated.The included ocular globes and orbital contents are non-suspicious. OTHER: None. CTA NECK AORTIC ARCH: Normal appearance of the thoracic arch, 2 vessel arch is a normal variant. Mild calcific atherosclerosis. The origins of the innominate, left Common carotid artery and  subclavian artery are widely patent. RIGHT CAROTID SYSTEM: Common carotid artery is widely patent, coursing in a straight line fashion. Mild eccentric calcific atherosclerosis carotid bifurcation without hemodynamically significant stenosis by NASCET criteria. Normal appearance of the internal carotid artery. LEFT CAROTID SYSTEM: Common carotid artery is widely patent, coursing in a straight line fashion. Shelf-like intimal thickening calcific atherosclerosis at origin resulting in less than 50% stenosis by NASCET criteria. Tortuous patent internal carotid artery. VERTEBRAL ARTERIES:Severe stenosis RIGHT vertebral artery origin. Patent codominant vertebral artery's. SKELETON: No acute osseous process though bone windows have not been submitted. OTHER NECK: Soft tissues of the neck are nonacute though, not tailored for evaluation. UPPER CHEST: 7 mm solid RIGHT upper lobe pulmonary nodule (series 10, image 7/186) CTA HEAD ANTERIOR CIRCULATION: Patent cervical internal carotid arteries, petrous, cavernous and supra clinoid internal carotid arteries. Calcific atherosclerosis resulting in mild stenosis bilateral supraclinoid internal carotid artery's. Patent anterior communicating artery. Patent anterior and middle cerebral arteries. Moderate stenosis proximal LEFT M2 superior division. Mild luminal irregularity of the anterior and middle cerebral artery's compatible with atherosclerosis. No large vessel occlusion, significant stenosis, contrast extravasation or aneurysm. POSTERIOR CIRCULATION: Patent vertebral arteries, vertebrobasilar junction and basilar artery, as well as main branch vessels. Patent posterior cerebral arteries. Moderate tandem stenosis RIGHT P1 and P2 segments. Mild luminal irregularity bilateral posterior cerebral artery's compatible with atherosclerosis. No large vessel occlusion, significant stenosis, contrast extravasation or aneurysm. VENOUS SINUSES: Major dural venous sinuses are patent though  not tailored for evaluation on this angiographic examination. ANATOMIC VARIANTS: None. DELAYED PHASE: No abnormal intracranial enhancement. MIP images reviewed. IMPRESSION: CT HEAD: 1. 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 2. Moderate chronic small vessel ischemic disease and old  lacunar infarcts. 3. Moderate parenchymal brain volume loss. CTA NECK: 1. Atherosclerosis without hemodynamically significant stenosis of the carotid artery's. No acute vascular process. 2. Severe stenosis RIGHT vertebral artery origin. 3. **An incidental finding of potential clinical significance has been found. 7 mm RIGHT upper lobe pulmonary nodule. Non-contrast chest CT at 6-12 months is recommended. If the nodule is stable at time of repeat CT, then future CT at 18-24 months (from today's scan) is considered optional for low-risk patients, but is recommended for high-risk patients. This recommendation follows the consensus statement: Guidelines for Management of Incidental Pulmonary Nodules Detected on CT Images: From the Fleischner Society 2017; Radiology 2017; 284:228-243.** CTA HEAD: 1. No emergent large vessel occlusion or severe stenosis. 2. Atherosclerosis. Moderate stenoses LEFT M2 segment, proximal RIGHT posterior cerebral artery. Acute findings discussed with and reconfirmed by [facility] JAMES on 08/09/2017 at 10:55 pm. Aortic Atherosclerosis (ICD10-I70.0). Electronically Signed   By: Elon Alas M.D.   On: 08/09/2017 22:56   [name] Contrast  Result Date: 08/10/2017 CLINICAL DATA:  Stroke.  Right arm tingling with difficulty speaking EXAM: MRI HEAD WITHOUT CONTRAST TECHNIQUE: Multiplanar, multiecho pulse sequences of the brain and surrounding structures were obtained without intravenous contrast. COMPARISON:  CT 08/09/2017 FINDINGS: Brain: Small cortical acute infarct in the left posterior parietal cortex. Additional small acute infarct in the left posterior frontal superficial  cortex. These measure approximately 5 mm each. Moderate atrophy. Chronic microvascular ischemic change throughout the cerebral white matter and pons. Chronic lacunar infarction right thalamus. Negative for hemorrhage or mass. Vascular: Normal arterial flow voids. Skull and upper cervical spine: Negative Sinuses/Orbits: Mild mucosal edema paranasal sinuses.  Normal orbit. Other: None IMPRESSION: Small foci of acute infarct in the posterior left MCA territory involving the posterior frontal lobes and posterior parietal lobe. Possible small emboli. Moderate atrophy and chronic microvascular ischemia. Electronically Signed   By: Franchot Gallo M.D.   On: 08/10/2017 10:34    Echocardiogram (08/10/2017)  Study Conclusions  - Left ventricle: The cavity size was normal. Wall thickness was   increased in a pattern of mild LVH. Systolic function was normal.   The estimated ejection fraction was in the range of 55% to 60%.   Subjective: No weakness. Feels his dysarthria is improving.  Discharge Exam: Vitals:   08/10/17 1140 08/10/17 1338  BP: 119/60 (!) 134/40  Pulse:  (!) 55  Resp: 16 18  Temp: 97.8 F (36.6 C) 98.7 F (37.1 C)  SpO2: 95% 99%   Vitals:   08/10/17 0541 08/10/17 0740 08/10/17 1140 08/10/17 1338  BP: (!) 151/82 (!) 140/59 119/60 (!) 134/40  Pulse: 81 74  (!) 55  Resp: 16 16 16 18   Temp: 97.8 F (36.6 C) 98 F (36.7 C) 97.8 F (36.6 C) 98.7 F (37.1 C)  TempSrc: Oral Oral Oral Oral  SpO2: 97% 97% 95% 99%  Weight: 131.5 kg (290 lb)     Height: 6' (1.829 m)       General: Pt is alert, awake, not in acute distress Cardiovascular: RRR, S1/S2 +, no rubs, no gallops Respiratory: CTA bilaterally, no wheezing, no rhonchi Abdominal: Soft, NT, ND, bowel sounds + Extremities: no edema, no cyanosis Neuro: mild dysarthria    The results of significant diagnostics from this hospitalization (including imaging, microbiology, ancillary and laboratory) are listed below for  reference.      Labs: Basic Metabolic Panel: Recent Labs  Lab 08/09/17 2045 08/09/17 2102  NA 135 137  K 4.4 4.4  CL 103 100*  CO2 26  --   GLUCOSE 313* 324*  BUN 20 19  CREATININE 1.15 1.10  CALCIUM 9.0  --    Liver Function Tests: Recent Labs  Lab 08/09/17 2045  AST 25  ALT 24  ALKPHOS 88  BILITOT 0.6  PROT 6.6  ALBUMIN 3.7   CBC: Recent Labs  Lab 08/09/17 2045 08/09/17 2102  WBC 11.4*  --   NEUTROABS 7.5  --   HGB 13.0 13.9  HCT 38.8* 41.0  MCV 86.6  --   PLT 881*  --    CBG: Recent Labs  Lab 08/09/17 2055 08/10/17 0552 08/10/17 0801 08/10/17 1157 08/10/17 1639  GLUCAP 303* 235* 241* 220* 274*   Urinalysis    Component Value Date/Time   COLORURINE YELLOW 10/26/2015 1013   APPEARANCEUR CLEAR 10/26/2015 1013   LABSPEC 1.017 10/26/2015 1013   PHURINE 5.0 10/26/2015 1013   GLUCOSEU >1000 (A) 10/26/2015 1013   HGBUR NEGATIVE 10/26/2015 [place] 10/26/2015 1013   KETONESUR NEGATIVE 10/26/2015 1013   PROTEINUR NEGATIVE 10/26/2015 1013   NITRITE NEGATIVE 10/26/2015 1013   LEUKOCYTESUR NEGATIVE 10/26/2015 1013    SIGNED:   Cordelia Poche, MD [facility] 08/10/2017, 5:40 PM Pager [phone]  If 7PM-7AM, please contact night-coverage [email or web address] Password [email or web address]

## 2017-08-10 NOTE — Progress Notes (Signed)
Discharge orders received.  Discharge instructions and follow-up appointments reviewed with the patient.  VSS upon discharge.  IV removed and education complete.  All belongings sent with the patient.  Transported out via wheelchair. Gill Delrossi M, RN   

## 2017-08-10 NOTE — Evaluation (Signed)
Speech Language Pathology Evaluation Patient Details Name: [name] MRN: [id number] DOB: 02-10-1941 Today's Date: 08/10/2017 Time: 0820-0856 SLP Time Calculation (min) (ACUTE ONLY): 36 min  Problem List:  Patient Active Problem List   Diagnosis Date Noted  . 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 HLD (hyperlipidemia) 08/09/2017  . Anxiety 08/09/2017  . Chronic diastolic CHF (congestive heart failure) ([place]) 08/09/2017  . OA (osteoarthritis) of knee 11/02/2015  . Hx of adenomatous colonic polyps 11/03/2014  . Paroxysmal atrial fibrillation ([place]) 11/03/2014  . Long term current use of anticoagulant therapy 11/03/2014  . Obesity (BMI 30-39.9) 07/15/2014  . HTN (hypertension) 07/15/2014  . Chronic anticoagulation 06/20/2014  . 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 Moderate obstructive sleep apnea 03/04/2014  . DUODENITIS WITHOUT MENTION OF HEMORRHAGE 06/24/2009  . Diabetes mellitus type 2, insulin dependent ([place]) 05/22/2009  . GERD 05/22/2009   Past Medical History:  Past Medical History:  Diagnosis Date  . Anxiety   . Arthritis   . Cancer (HCC)    skin - basil cell  . Depression   . Diabetes mellitus without complication ([place])   . Dysrhythmia    a-fib  . GERD (gastroesophageal reflux disease)   . Hyperlipidemia   . Hypertension   . Neuropathy   . Obesity   . Paroxysmal atrial fibrillation (HCC)   . Peripheral vascular disease ([place])    diabetic neuropathy in both feet  . Sleep apnea    uses C-pap machine   Past Surgical History:  Past Surgical History:  Procedure Laterality Date  . APPENDECTOMY  1962  . BACK SURGERY  00-02-12   x3  . BASAL CELL CARCINOMA EXCISION  93/06/10  . COLONOSCOPY    . KNEE ARTHROSCOPY  005/01/02  . 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   Procedure: TOTAL LEFT KNEE ARTHROPLASTY;  Surgeon: [name], MD;  Location: [facility];  Service: Orthopedics;  Laterality: Left;   HPI:  76 yo male adm to [facility] with expressive  language deficits and right numbness.  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  PMH  + for DM, GERD, sleep apnea.  Speech eval ordered.    Assessment / Plan / Recommendation Clinical Impression  Patient presents with fluent speech, language and intact cognition. MOCA 7.2 given to pt with him scoring 28/30- WNL.  He recalled 4/5 words independently, 1 with category cue.  Fluency - 10 words within one minute when expected to state 11.  No dysfluencies observed during complex conversation.  Pt with excellent attention and visuospatial skills.  No SLP follow up indicated.  Thanks for this referral.     SLP Assessment  SLP Recommendation/Assessment: Patient does not need any further Speech Lanaguage Pathology Services    Follow Up Recommendations  None    Frequency and Duration   n/a        SLP Evaluation Cognition  Overall Cognitive Status: Within Functional Limits for tasks assessed Arousal/Alertness: Awake/alert Orientation Level: Oriented X4 Attention: Selective;Sustained Sustained Attention: Appears intact Selective Attention: Appears intact Memory: Appears intact(recalled 4/5 words I, 1/5 with category cue) Awareness: Appears intact Problem Solving: Appears intact Safety/Judgment: Appears intact       Comprehension  Auditory Comprehension Overall Auditory Comprehension: Appears within functional limits for tasks assessed Yes/No Questions: Not tested Commands: Within Functional Limits Conversation: Complex Visual Recognition/Discrimination Discrimination: Within Function Limits Reading Comprehension Reading Status: Within funtional limits    Expression Expression Primary Mode of Expression: Verbal Verbal Expression Overall Verbal Expression: Appears within functional limits for  tasks assessed Initiation: No impairment Repetition: No impairment Naming: No impairment Pragmatics: No impairment Written Expression Dominant Hand: Right Written Expression: Within Functional Limits    Oral / Motor  Oral Motor/Sensory Function Overall Oral Motor/Sensory Function: Within functional limits Motor Speech Overall Motor Speech: Appears within functional limits for tasks assessed Respiration: Within functional limits Resonance: Within functional limits Articulation: Within functional limitis Intelligibility: Intelligible Motor Planning: Witnin functional limits   GO                    [name] 08/10/2017, 9:14 AM  Luanna Salk, [facility] SLP [phone]

## 2017-08-10 NOTE — Progress Notes (Signed)
PHARMACIST - PHYSICIAN ORDER COMMUNICATION  CONCERNING: P&T Medication Policy on Herbal Medications  DESCRIPTION:  This patient's order for:  Nerve Pain Relief  has been noted.  This product(s) is classified as an "herbal" or natural product. Due to a lack of definitive safety studies or FDA approval, nonstandard manufacturing practices, plus the potential risk of unknown drug-drug interactions while on inpatient medications, the Pharmacy and Therapeutics Committee does not permit the use of "herbal" or natural products of this type within [facility].   ACTION TAKEN: The pharmacy department is unable to verify this order at this time and your patient has been informed of this safety policy. Please reevaluate patient's clinical condition at discharge and address if the herbal or natural product(s) should be resumed at that time.

## 2017-08-10 NOTE — ED Notes (Signed)
Assisted to side of bed to void-assisted back to bed

## 2017-08-10 NOTE — Evaluation (Signed)
Physical Therapy Evaluation Patient Details Name: [name] MRN: [id number] DOB: [birth year] Today's Date: 08/10/2017   History of Present Illness  [name] is a 76 y.o. male with medical history significant of hypertension, hyperlipidemia, diabetes mellitus, GERD, anxiety, OSA not on CPAP, PAF on Xarelto, PVD, dCHF, who presents with slurred speech, difficulty speaking, blurry vision and right arm numbness.  Clinical Impression  Patient evaluated by Physical Therapy with no further acute PT needs identified. All education has been completed and the patient has no further questions.  See below for any follow-up Physical Therapy or equipment needs. PT is signing off. Thank you for this referral.    Follow Up Recommendations No PT follow up    Equipment Recommendations  None recommended by PT    Recommendations for Other Services       Precautions / Restrictions Precautions Precautions: Fall      Mobility  Bed Mobility Overal bed mobility: Modified Independent                Transfers Overall transfer level: Needs assistance Equipment used: None Transfers: Sit to/from Stand Sit to Stand: Supervision;Modified independent (Device/Increase time)            Ambulation/Gait Ambulation/Gait assistance: Supervision;Modified independent (Device/Increase time) Ambulation Distance (Feet): 160 Feet Assistive device: None Gait Pattern/deviations: Wide base of support;Decreased stance time - right     General Gait Details: walks with a "limp" at baseline d/t right knee pain; pt reports his gait is at baseline currently; no LOB with head turns, conversation,  obstacles in hallway  Stairs            Wheelchair Mobility    Modified Rankin (Stroke Patients Only)       Balance Overall balance assessment: Modified Independent                                           Pertinent Vitals/Pain      Home Living Family/patient expects to  be discharged to:: Private residence Living Arrangements: Spouse/significant other Available Help at Discharge: Family;Available 24 hours/day Type of Home: House Home Access: Stairs to enter Entrance Stairs-Rails: Psychiatric nurse of Steps: 3 Home Layout: Multi-level Home Equipment: Walker - 2 wheels;Bedside commode      Prior Function Level of Independence: Independent;Independent with assistive device(s)         Comments: using walking stick for ambulation      Hand Dominance   Dominant Hand: Right    Extremity/Trunk Assessment   Upper Extremity Assessment Upper Extremity Assessment: Defer to OT evaluation    Lower Extremity Assessment Lower Extremity Assessment: Overall WFL for tasks assessed;RLE deficits/detail(pt denies any current N/T or sensory issues) RLE Deficits / Details: "right knee is bad"; strength grossly WFL       Communication   Communication: No difficulties  Cognition Arousal/Alertness: Awake/alert(falls asleep easily) Behavior During Therapy: WFL for tasks assessed/performed Overall Cognitive Status: Within Functional Limits for tasks assessed                                        General Comments      Exercises     Assessment/Plan    PT Assessment Patient needs continued PT services  PT Problem List  PT Treatment Interventions      PT Goals (Current goals can be found in the Care Plan section)  Acute Rehab PT Goals Patient Stated Goal: home soon PT Goal Formulation: All assessment and education complete, DC therapy    Frequency     Barriers to discharge        Co-evaluation               AM-PAC PT "6 Clicks" Daily Activity  Outcome Measure Difficulty turning over in bed (including adjusting bedclothes, sheets and blankets)?: None Difficulty moving from lying on back to sitting on the side of the bed? : None Difficulty sitting down on and standing up from a chair with arms  (e.g., wheelchair, bedside commode, etc,.)?: None Help needed moving to and from a bed to chair (including a wheelchair)?: None Help needed walking in hospital room?: None Help needed climbing 3-5 steps with a railing? : A Little 6 Click Score: 23    End of Session Equipment Utilized During Treatment: Gait belt Activity Tolerance: Patient tolerated treatment well Patient left: in bed;with call bell/phone within reach;with bed alarm set   PT Visit Diagnosis: Other abnormalities of gait and mobility (R26.89)    Time: 1694-5038 PT Time Calculation (min) (ACUTE ONLY): 26 min   Charges:   PT Evaluation $PT Eval Low Complexity: 1 Low PT Treatments $Gait Training: 8-22 mins   PT G Codes:          Elven Laboy 2017/09/05, 3:45 PM

## 2017-08-10 NOTE — ED Notes (Signed)
ED TO INPATIENT HANDOFF REPORT  Name/Age/Gender [name] 76 y.o. male  Code Status    Code Status Orders  (From admission, onward)        Start     Ordered   08/09/17 2334  Full code  Continuous     08/09/17 2335    Code Status History    Date Active Date Inactive Code Status Order ID Comments User Context   This patient has a current code status but no historical code status.      Home/SNF/Other Home  Chief Complaint possible min stroke  Level of Care/Admitting Diagnosis ED Disposition    ED Disposition Condition [facility] Area: [place] [[id number]]  Level of Care: Telemetry [5]  Diagnosis: Stroke (cerebrum) [facility]) [[id number]]  Admitting Physician: Ivor Costa [[id number]]  Attending Physician: Ivor Costa [phone]  Estimated length of stay: past midnight tomorrow  Certification:: I certify this patient will need inpatient services for at least 2 midnights  PT Class (Do Not Modify): Inpatient [101]  PT Acc Code (Do Not Modify): Private [1]       Medical History Past Medical History:  Diagnosis Date  . Anxiety   . Arthritis   . Cancer (HCC)    skin - basil cell  . Depression   . Diabetes mellitus without complication ([place])   . Dysrhythmia    a-fib  . GERD (gastroesophageal reflux disease)   . Hyperlipidemia   . Hypertension   . Neuropathy   . Obesity   . Paroxysmal atrial fibrillation (HCC)   . Peripheral vascular disease ([place])    diabetic neuropathy in both feet  . Sleep apnea    uses C-pap machine    Allergies No Known Allergies  IV Location/Drains/Wounds Patient Lines/Drains/Airways Status   Active Line/Drains/Airways    Name:   Placement date:   Placement time:   Site:   Days:   Peripheral IV 08/09/17 Right Arm   08/09/17    2059    Arm   1   Incision (Closed) 11/02/15 Knee Left   11/02/15    0921     647          Labs/Imaging Results for orders placed or performed during the hospital encounter of  08/09/17 (from the past 48 hour(s))  Protime-INR     Status: None   Collection Time: 08/09/17  8:45 PM  Result Value Ref Range   Prothrombin Time 14.6 11.4 - 15.2 seconds   INR 1.15   APTT     Status: None   Collection Time: 08/09/17  8:45 PM  Result Value Ref Range   aPTT 35 24 - 36 seconds  CBC     Status: Abnormal   Collection Time: 08/09/17  8:45 PM  Result Value Ref Range   WBC 11.4 (H) 4.0 - 10.5 K/uL   RBC 4.48 4.22 - 5.81 MIL/uL   Hemoglobin 13.0 13.0 - 17.0 g/dL   HCT 38.8 (L) 39.0 - 52.0 %   MCV 86.6 78.0 - 100.0 fL   MCH 29.0 26.0 - 34.0 pg   MCHC 33.5 30.0 - 36.0 g/dL   RDW 16.6 (H) 11.5 - 15.5 %   Platelets 881 (H) 150 - 400 K/uL  Differential     Status: Abnormal   Collection Time: 08/09/17  8:45 PM  Result Value Ref Range   Neutrophils Relative % 66 %   Neutro Abs 7.5 1.7 - 7.7 K/uL   Lymphocytes  Relative 22 %   Lymphs Abs 2.5 0.7 - 4.0 K/uL   Monocytes Relative 7 %   Monocytes Absolute 0.8 0.1 - 1.0 K/uL   Eosinophils Relative 3 %   Eosinophils Absolute 0.3 0.0 - 0.7 K/uL   Basophils Relative 2 %   Basophils Absolute 0.2 (H) 0.0 - 0.1 K/uL  Comprehensive metabolic panel     Status: Abnormal   Collection Time: 08/09/17  8:45 PM  Result Value Ref Range   Sodium 135 135 - 145 mmol/L   Potassium 4.4 3.5 - 5.1 mmol/L   Chloride 103 101 - 111 mmol/L   CO2 26 22 - 32 mmol/L   Glucose, Bld 313 (H) 65 - 99 mg/dL   BUN 20 6 - 20 mg/dL   Creatinine, Ser 1.15 0.61 - 1.24 mg/dL   Calcium 9.0 8.9 - 10.3 mg/dL   Total Protein 6.6 6.5 - 8.1 g/dL   Albumin 3.7 3.5 - 5.0 g/dL   AST 25 15 - 41 U/L   ALT 24 17 - 63 U/L   Alkaline Phosphatase 88 38 - 126 U/L   Total Bilirubin 0.6 0.3 - 1.2 mg/dL   GFR calc non Af Amer >60 >60 mL/min   GFR calc Af Amer >60 >60 mL/min    Comment: (NOTE) The eGFR has been calculated using the CKD EPI equation. This calculation has not been validated in all clinical situations. eGFR's persistently <60 mL/min signify possible Chronic  Kidney Disease.    Anion gap 6 5 - 15  CBG monitoring, ED     Status: Abnormal   Collection Time: 08/09/17  8:55 PM  Result Value Ref Range   Glucose-Capillary 303 (H) 65 - 99 mg/dL  I-stat troponin, ED     Status: None   Collection Time: 08/09/17  9:01 PM  Result Value Ref Range   Troponin i, poc 0.00 0.00 - 0.08 ng/mL   Comment 3            Comment: Due to the release kinetics of cTnI, a negative result within the first hours of the onset of symptoms does not rule out myocardial infarction with certainty. If myocardial infarction is still suspected, repeat the test at appropriate intervals.   I-Stat Chem 8, ED     Status: Abnormal   Collection Time: 08/09/17  9:02 PM  Result Value Ref Range   Sodium 137 135 - 145 mmol/L   Potassium 4.4 3.5 - 5.1 mmol/L   Chloride 100 (L) 101 - 111 mmol/L   BUN 19 6 - 20 mg/dL   Creatinine, Ser 1.10 0.61 - 1.24 mg/dL   Glucose, Bld 324 (H) 65 - 99 mg/dL   Calcium, Ion 1.19 1.15 - 1.40 mmol/L   TCO2 26 22 - 32 mmol/L   Hemoglobin 13.9 13.0 - 17.0 g/dL   HCT 41.0 39.0 - 52.0 %   Ct Angio Head W Or Wo Contrast  Result Date: 08/09/2017 CLINICAL DATA:  Slurred speech, word-finding difficulties, intermittent RIGHT arm tingling and blurry vision for 1-2 days. Assess TIA. History of hypertension, hyperlipidemia, diabetes. EXAM: CT ANGIOGRAPHY HEAD AND NECK TECHNIQUE: Multidetector CT imaging of the head and neck was performed using the standard protocol during bolus administration of intravenous contrast. Multiplanar CT image reconstructions and MIPs were obtained to evaluate the vascular anatomy. Carotid stenosis measurements (when applicable) are obtained utilizing NASCET criteria, using the distal internal carotid diameter as the denominator. CONTRAST:  75 cc Isovue 370 COMPARISON:  MRI of the head   July 23, 2010 FINDINGS: CT HEAD FINDINGS BRAIN: No intraparenchymal hemorrhage, mass effect nor midline shift. Small area LEFT frontal convexity  blurring of the gray-white matter differentiation. Moderate parenchymal brain volume loss, no hydrocephalus. Patchy to confluent supratentorial white matter hypodensities. Old bilateral basal ganglia lacunar infarcts and/or prominent perivascular spaces associated with chronic small vessel ischemic disease. VASCULAR: Moderate calcific atherosclerosis of the carotid siphons. SKULL: No skull fracture. No significant scalp soft tissue swelling. SINUSES/ORBITS: Mild paranasal sinus mucosal thickening. Mastoid air cells are well aerated.The included ocular globes and orbital contents are non-suspicious. OTHER: None. CTA NECK AORTIC ARCH: Normal appearance of the thoracic arch, 2 vessel arch is a normal variant. Mild calcific atherosclerosis. The origins of the innominate, left Common carotid artery and subclavian artery are widely patent. RIGHT CAROTID SYSTEM: Common carotid artery is widely patent, coursing in a straight line fashion. Mild eccentric calcific atherosclerosis carotid bifurcation without hemodynamically significant stenosis by NASCET criteria. Normal appearance of the internal carotid artery. LEFT CAROTID SYSTEM: Common carotid artery is widely patent, coursing in a straight line fashion. Shelf-like intimal thickening calcific atherosclerosis at origin resulting in less than 50% stenosis by NASCET criteria. Tortuous patent internal carotid artery. VERTEBRAL ARTERIES:Severe stenosis RIGHT vertebral artery origin. Patent codominant vertebral artery's. SKELETON: No acute osseous process though bone windows have not been submitted. OTHER NECK: Soft tissues of the neck are nonacute though, not tailored for evaluation. UPPER CHEST: 7 mm solid RIGHT upper lobe pulmonary nodule (series 10, image 7/186) CTA HEAD ANTERIOR CIRCULATION: Patent cervical internal carotid arteries, petrous, cavernous and supra clinoid internal carotid arteries. Calcific atherosclerosis resulting in mild stenosis bilateral supraclinoid  internal carotid artery's. Patent anterior communicating artery. Patent anterior and middle cerebral arteries. Moderate stenosis proximal LEFT M2 superior division. Mild luminal irregularity of the anterior and middle cerebral artery's compatible with atherosclerosis. No large vessel occlusion, significant stenosis, contrast extravasation or aneurysm. POSTERIOR CIRCULATION: Patent vertebral arteries, vertebrobasilar junction and basilar artery, as well as main branch vessels. Patent posterior cerebral arteries. Moderate tandem stenosis RIGHT P1 and P2 segments. Mild luminal irregularity bilateral posterior cerebral artery's compatible with atherosclerosis. No large vessel occlusion, significant stenosis, contrast extravasation or aneurysm. VENOUS SINUSES: Major dural venous sinuses are patent though not tailored for evaluation on this angiographic examination. ANATOMIC VARIANTS: None. DELAYED PHASE: No abnormal intracranial enhancement. MIP images reviewed. IMPRESSION: CT HEAD: 1. 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 2. 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 3. Moderate parenchymal brain volume loss. CTA NECK: 1. Atherosclerosis without hemodynamically significant stenosis of the carotid artery's. No acute vascular process. 2. Severe stenosis RIGHT vertebral artery origin. 3. **An incidental finding of potential clinical significance has been found. 7 mm RIGHT upper lobe pulmonary nodule. Non-contrast chest CT at 6-12 months is recommended. If the nodule is stable at time of repeat CT, then future CT at 18-24 months (from today's scan) is considered optional for low-risk patients, but is recommended for high-risk patients. This recommendation follows the consensus statement: Guidelines for Management of Incidental Pulmonary Nodules Detected on CT Images: From the Fleischner Society 2017; Radiology 2017; 284:228-243.** CTA HEAD: 1. No emergent large  vessel occlusion or severe stenosis. 2. Atherosclerosis. Moderate stenoses LEFT M2 segment, proximal RIGHT posterior cerebral artery. Acute findings discussed with and reconfirmed by Dr.MARK JAMES on 08/09/2017 at 10:55 pm. Aortic Atherosclerosis (ICD10-I70.0). Electronically Signed   By: Courtnay  Bloomer M.D.   On: 08/09/2017 22:56   Ct Angio Neck W And/or Wo Contrast    Result Date: 08/09/2017 CLINICAL DATA:  Slurred speech, word-finding difficulties, intermittent RIGHT arm tingling and blurry vision for 1-2 days. Assess TIA. History of hypertension, hyperlipidemia, diabetes. EXAM: CT ANGIOGRAPHY HEAD AND NECK TECHNIQUE: Multidetector CT imaging of the head and neck was performed using the standard protocol during bolus administration of intravenous contrast. Multiplanar CT image reconstructions and MIPs were obtained to evaluate the vascular anatomy. Carotid stenosis measurements (when applicable) are obtained utilizing NASCET criteria, using the distal internal carotid diameter as the denominator. CONTRAST:  75 cc Isovue 370 COMPARISON:  MRI of the head July 23, 2010 FINDINGS: CT HEAD FINDINGS BRAIN: No intraparenchymal hemorrhage, mass effect nor midline shift. Small area LEFT frontal convexity blurring of the gray-white matter differentiation. Moderate parenchymal brain volume loss, no hydrocephalus. Patchy to confluent supratentorial white matter hypodensities. Old bilateral basal ganglia lacunar infarcts and/or prominent perivascular spaces associated with chronic small vessel ischemic disease. VASCULAR: Moderate calcific atherosclerosis of the carotid siphons. SKULL: No skull fracture. No significant scalp soft tissue swelling. SINUSES/ORBITS: Mild paranasal sinus mucosal thickening. Mastoid air cells are well aerated.The included ocular globes and orbital contents are non-suspicious. OTHER: None. CTA NECK AORTIC ARCH: Normal appearance of the thoracic arch, 2 vessel arch is a normal variant. Mild  calcific atherosclerosis. The origins of the innominate, left Common carotid artery and subclavian artery are widely patent. RIGHT CAROTID SYSTEM: Common carotid artery is widely patent, coursing in a straight line fashion. Mild eccentric calcific atherosclerosis carotid bifurcation without hemodynamically significant stenosis by NASCET criteria. Normal appearance of the internal carotid artery. LEFT CAROTID SYSTEM: Common carotid artery is widely patent, coursing in a straight line fashion. Shelf-like intimal thickening calcific atherosclerosis at origin resulting in less than 50% stenosis by NASCET criteria. Tortuous patent internal carotid artery. VERTEBRAL ARTERIES:Severe stenosis RIGHT vertebral artery origin. Patent codominant vertebral artery's. SKELETON: No acute osseous process though bone windows have not been submitted. OTHER NECK: Soft tissues of the neck are nonacute though, not tailored for evaluation. UPPER CHEST: 7 mm solid RIGHT upper lobe pulmonary nodule (series 10, image 7/186) CTA HEAD ANTERIOR CIRCULATION: Patent cervical internal carotid arteries, petrous, cavernous and supra clinoid internal carotid arteries. Calcific atherosclerosis resulting in mild stenosis bilateral supraclinoid internal carotid artery's. Patent anterior communicating artery. Patent anterior and middle cerebral arteries. Moderate stenosis proximal LEFT M2 superior division. Mild luminal irregularity of the anterior and middle cerebral artery's compatible with atherosclerosis. No large vessel occlusion, significant stenosis, contrast extravasation or aneurysm. POSTERIOR CIRCULATION: Patent vertebral arteries, vertebrobasilar junction and basilar artery, as well as main branch vessels. Patent posterior cerebral arteries. Moderate tandem stenosis RIGHT P1 and P2 segments. Mild luminal irregularity bilateral posterior cerebral artery's compatible with atherosclerosis. No large vessel occlusion, significant stenosis, contrast  extravasation or aneurysm. VENOUS SINUSES: Major dural venous sinuses are patent though not tailored for evaluation on this angiographic examination. ANATOMIC VARIANTS: None. DELAYED PHASE: No abnormal intracranial enhancement. MIP images reviewed. IMPRESSION: CT HEAD: 1. 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 2. 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 3. Moderate parenchymal brain volume loss. CTA NECK: 1. Atherosclerosis without hemodynamically significant stenosis of the carotid artery's. No acute vascular process. 2. Severe stenosis RIGHT vertebral artery origin. 3. **An incidental finding of potential clinical significance has been found. 7 mm RIGHT upper lobe pulmonary nodule. Non-contrast chest CT at 6-12 months is recommended. If the nodule is stable at time of repeat CT, then future CT at 18-24 months (from today's scan) is considered optional   for low-risk patients, but is recommended for high-risk patients. This recommendation follows the consensus statement: Guidelines for Management of Incidental Pulmonary Nodules Detected on CT Images: From the Fleischner Society 2017; Radiology 2017; 284:228-243.** CTA HEAD: 1. No emergent large vessel occlusion or severe stenosis. 2. Atherosclerosis. Moderate stenoses LEFT M2 segment, proximal RIGHT posterior cerebral artery. Acute findings discussed with and reconfirmed by [facility] JAMES on 08/09/2017 at 10:55 pm. Aortic Atherosclerosis (ICD10-I70.0). Electronically Signed   By: Elon Alas M.D.   On: 08/09/2017 22:56    Pending Labs Unresulted Labs (From admission, onward)   Start     Ordered   08/10/17 0500  Brain natriuretic peptide  Tomorrow morning,   R     08/09/17 2331   08/10/17 0500  Hemoglobin A1c  Tomorrow morning,   R     08/09/17 2335   08/10/17 0500  Lipid panel  Tomorrow morning,   R    Comments:  Fasting    08/09/17 2335      Vitals/Pain Today's Vitals   08/09/17  2028 08/09/17 2031 08/09/17 2242 08/10/17 0006  BP:  (!) 157/95 (!) 143/86   Pulse:   96   Resp:   18   Temp:    98.1 F (36.7 C)  TempSrc:      SpO2:   98%   Weight: 290 lb (131.5 kg)     Height: 6' (1.829 m)     PainSc:        Isolation Precautions No active isolations  Medications Medications  insulin aspart protamine- aspart (NOVOLOG MIX 70/30) injection 30 Units (not administered)  ALPRAZolam (XANAX) tablet 0.5 mg (not administered)  furosemide (LASIX) tablet 20 mg (not administered)  gabapentin (NEURONTIN) capsule 300 mg (not administered)  NERVE PAIN RELIEF SUBL 1 tablet (not administered)  MULTIVITAMIN ADULT TABS 1 tablet (not administered)  pantoprazole (PROTONIX) EC tablet 40 mg (not administered)  rivaroxaban (XARELTO) tablet 20 mg (not administered)  venlafaxine XR (EFFEXOR-XR) 24 hr capsule 75 mg (not administered)  atorvastatin (LIPITOR) tablet 80 mg (not administered)  insulin aspart (novoLOG) injection 0-9 Units (not administered)   stroke: mapping our early stages of recovery book (not administered)  acetaminophen (TYLENOL) tablet 650 mg (not administered)    Or  acetaminophen (TYLENOL) solution 650 mg (not administered)    Or  acetaminophen (TYLENOL) suppository 650 mg (not administered)  senna-docusate (Senokot-S) tablet 1 tablet (not administered)  ondansetron (ZOFRAN) injection 4 mg (not administered)  hydrALAZINE (APRESOLINE) injection 5 mg (not administered)  zolpidem (AMBIEN) tablet 5 mg (not administered)  iopamidol (ISOVUE-370) 76 % injection 75 mL (100 mLs Intravenous Contrast Given 08/09/17 2209)    Mobility walks

## 2017-08-10 NOTE — Progress Notes (Signed)
STROKE TEAM PROGRESS NOTE  Admission History: [name] is a 76 y.o. male with a history of afib on Xarelto who has been having difficulty speaking since awakening this morning. He states that it seems worse at times, butthese episodes of worsening are only for a few seconds. He has also had two episodes of right sided numbness lasting a few seconds as well. 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   LKW: 11/27 prior to bed.  tpa given?: no, out of window  SUBJECTIVE (INTERVAL HISTORY) Patient is found laying in bed in NAD. Overall he feels his condition is stable. Voices no new complaints. No new events reported overnight.  OBJECTIVE Lab Results: CBC:  Recent Labs  Lab 08/09/17 2045 08/09/17 2102  WBC 11.4*  --   HGB 13.0 13.9  HCT 38.8* 41.0  MCV 86.6  --   PLT 881*  --    BMP: Recent Labs  Lab 08/09/17 2045 08/09/17 2102  NA 135 137  K 4.4 4.4  CL 103 100*  CO2 26  --   GLUCOSE 313* 324*  BUN 20 19  CREATININE 1.15 1.10  CALCIUM 9.0  --    Liver Function Tests:  Recent Labs  Lab 08/09/17 2045  AST 25  ALT 24  ALKPHOS 88  BILITOT 0.6  PROT 6.6  ALBUMIN 3.7   Coagulation Studies:  Recent Labs    08/09/17 2045  APTT 35  INR 1.15   PHYSICAL EXAM Temp:  [97.8 F (36.6 C)-98.7 F (37.1 C)] 98.7 F (37.1 C) (11/29 1338) Pulse Rate:  [55-96] 55 (11/29 1338) Resp:  [15-22] 18 (11/29 1338) BP: (119-157)/(40-95) 134/40 (11/29 1338) SpO2:  [94 %-99 %] 99 % (11/29 1338) Weight:  [131.5 kg (290 lb)] 131.5 kg (290 lb) (11/29 0541) General - Well nourished, well developed, in no apparent distress Respiratory - Lungs clear bilaterally. No wheezing. Cardiovascular - Irregular rate and rhythm   Neuro: Mental Status: Patient is awake, alert, oriented to person, place, month, year, and situation. Patient is able to give a clear and coherent history. He has a mild expressive aphasia with many mild  disfluency and word finding difficulties. Able to name and comprehend quite well.  Repetition is intact. Cranial Nerves: II: Visual Fields are full. Pupils are equal, round, and reactive to light.   III,IV, VI: EOMI without ptosis or diploplia.  V: Facial sensation is symmetric to temperature VII: Facial movement is symmetric.  VIII: hearing is intact to voice X: Uvula elevates symmetrically XI: Shoulder shrug is symmetric. XII: tongue is midline without atrophy or fasciculations.  Motor: Tone is normal. Bulk is normal. 5/5 strength was present in all four extremities.  Sensory: Sensation is symmetric to light touch and temperature in the arms and legs. Plantars: Toes are downgoing bilaterally.  Cerebellar: FNF and HKS are intact bilaterally  IMAGING: I have personally reviewed the radiological images below and agree with the radiology interpretations. Ct Angio Head & Neck W Or Wo Contrast Result Date: 08/09/2017 IMPRESSION: CT HEAD: 1. 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 2. 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 3. Moderate parenchymal brain volume loss. CTA NECK: 1. Atherosclerosis without hemodynamically significant stenosis of the carotid artery's. No acute vascular process. 2. Severe stenosis RIGHT vertebral artery origin. 3. **An incidental finding of potential clinical significance has been found. 7 mm RIGHT upper lobe pulmonary nodule. Non-contrast  chest CT at 6-12 months is recommended. If the nodule is stable at time of repeat CT, then future CT at 18-24 months (from today's scan) is considered optional for low-risk patients, but is recommended for high-risk patients. This recommendation follows the consensus statement: Guidelines for Management of Incidental Pulmonary Nodules Detected on CT Images: From the Fleischner Society 2017; Radiology 2017; 284:228-243.** CTA HEAD: 1. No emergent large vessel occlusion or  severe stenosis. 2. Atherosclerosis. Moderate stenoses LEFT M2 segment, proximal RIGHT posterior cerebral artery. Acute findings discussed with and reconfirmed by [facility] JAMES on 08/09/2017 at 10:55 pm. Aortic Atherosclerosis (ICD10-I70.0). Electronically Signed   By: Elon Alas M.D.   On: 08/09/2017 22:56   [name] Contrast Result Date: 08/10/2017 IMPRESSION: Small foci of acute infarct in the posterior left MCA territory involving the posterior frontal lobes and posterior parietal lobe. Possible small emboli. Moderate atrophy and chronic microvascular ischemia. Electronically Signed   By: Franchot Gallo M.D.   On: 08/10/2017 10:34    Echocardiogram:                                              PENDING _____________________________________________________________________ ASSESSMENT: [name] is a 76 y.o. male with PMH of HTN, DM, Hx AFIB on Xarelto admitted for Aphasia and Right sided numbness. CTA showing left MCA territory infarct and left M2 stenosis.   Acute small LEFT frontal lobe/MCA territory nonhemorrhagic infarct with Moderate stenoses LEFT M2 segment, proximal RIGHT posterior cerebral artery.  Suspected Etiology: embolic, Possible etiologies include Breakthrough from his A. fib despite Xarelto versus artery to artery from his M2 stenosis Resultant Symptoms: Expressive Aphasia, Right sided numbness Stroke Risk Factors: atrial fibrillation, diabetes mellitus, hyperlipidemia and hypertension Other Stroke Risk Factors: Advanced age, Obesity, Body mass index is 39.33 kg/m. , CHF  Outstanding Stroke Work-up Studies: Echocardiogram:                                                               PENDING Stroke Labs                                                                        PENDING  08/10/17: Neuro exam stable. Patient explains that he had not been consistently taking Xarelto with a meal. Has agreed to switch to Eliquis  PLAN  08/10/2017: Continue Aspirin  for now- pharmacy to start Eliquis Continue high dose Statin until Lipid panel resulted PT/OT/SLP Ongoing aggressive stroke risk factor management Patient counseled to be compliant with her antithrombotic medications  AFIB, CHRONIC: Will change to Eliquis, Pharmacy to dose Outpatient follow up with Cardiology   7 mm RIGHT upper lobe pulmonary nodule.  Non-contrast chest CT at 6-12 months is recommended.  If the nodule is stable at time of repeat CT, then future CT at 18-24 months (from today's scan)  HYPERTENSION: Stable Permissive hypertension (OK if <220/120) for 24-48  hours post stroke and then gradually normalized within 5-7 days. Long term BP goal normotensive. May slowly restart home B/P medications after 48 hours Home Meds: Lotensin,   HYPERLIPIDEMIA:       PENDING No results found for: CHOL, TRIG, HDL, CHOLHDL, VLDL, LDLCALC Home Meds:  Zocor 40 mg LDL  goal < 70 Started on Lipitor to 80 mg daily Continue statin at discharge  DIABETES: No results found for: HGBA1C-      PENDING Recent Labs  Lab 08/09/17 2055 08/10/17 0552 08/10/17 0801 08/10/17 1157  GLUCAP 303* 235* 241* 220*  HgbA1c goal < 7.0 Currently YW:VPXTGGY Continue CBG monitoring and SSI DM education   OBESITY Obesity, Body mass index is 39.33 kg/m. Greater than/equal to 30  Other Active Problems: Principal Problem:   Stroke (cerebrum) (HCC) Active Problems:   Diabetes mellitus type 2, insulin dependent (HCC)   GERD   HTN (hypertension)   Paroxysmal atrial fibrillation (HCC)   Long term current use of anticoagulant therapy   HLD (hyperlipidemia)   Anxiety   Chronic diastolic CHF (congestive heart failure) [facility])  Hospital day # 1  VTE prophylaxis: SCD's  Diet : Diet heart healthy/carb modified Room service appropriate? Yes; Fluid consistency: Thin   Prior Home Stroke Medications: Xarelto (rivaroxaban) daily and Zocor 40 mg  Hospital Current Stroke Medications: Stroke New Meds Plan:  Now on aspirin 325 mg daily and Lipitor 80 mg  Discharge Stroke Meds:  Please discharge patient on Eliquis and statin, awaiting lab results  Disposition: 06-Home-Health Care Svc Therapy Recs:   Follow Recs:  Follow-up Information    Garvin Fila, MD. Schedule an appointment as soon as possible for a visit in 6 week(s).   Specialties:  Neurology, Radiology Contact information: [address] [place] [address] [phone]          [name], MD- PCP in 2-3 weeks   FAMILY UPDATES: No family at bedside  TEAM UPDATES: Mariel Aloe, MD I have personally examined this patient, reviewed notes, independently viewed imaging studies, participated in medical decision making and plan of care.ROS completed by me personally and pertinent positives fully documented  I have made any additions or clarifications directly to the above note.  He presented with transient aphasia and right sided numbness from left MCA branch infarct likely embolic from atrial fibrillation despite being on anticoagulation with Xarelto. I have discussed with him at length alternatives and he would like to switch to eliquis 5 mg twice daily. Discussed with Dr. Elder Love and patient and answered questions. Greater than 50% time during this 35 minute visit was spent on counseling and coordination of care about of embolic stroke, to fibrillation, discussion about treatment options, risk for bleeding and answered questions Antony Contras, MD Medical Director [place] Pager: [phone] 08/10/2017 4:52 PM  Stroke Neurology Team 08/10/2017 3:49 PM   To contact Stroke Continuity provider, please refer to [email or web address] After hours, contact General Neurology

## 2017-08-10 NOTE — Evaluation (Signed)
Occupational Therapy Evaluation Patient Details Name: [name] MRN: [id number] DOB: [birth year] Today's Date: 08/10/2017    History of Present Illness [name] is a 77 y.o. male with medical history significant of hypertension, hyperlipidemia, diabetes mellitus, GERD, anxiety, OSA not on CPAP, PAF on Xarelto, PVD, dCHF, who presents with slurred speech, difficulty speaking, blurry vision and right arm numbness.   Clinical Impression   This 76 y/o M presents with the above. Pt lives with spouse, reports overall independence with ADLs, mod independence with functional mobility using walking stick. Pt completed room level functional mobility, seated ADLs with overall minguard assist this session. Pt reports feeling at his baseline with mobility and ADL completion. Pt will return home with available assist from spouse PRN. Thank you for this referral. No further acute OT needs identified at this time. Will sign off.     Follow Up Recommendations  No OT follow up;Supervision/Assistance - 24 hour    Equipment Recommendations  None recommended by OT           Precautions / Restrictions Precautions Precautions: Fall Restrictions Weight Bearing Restrictions: No      Mobility Bed Mobility Overal bed mobility: Modified Independent                Transfers Overall transfer level: Needs assistance Equipment used: None Transfers: Sit to/from Stand Sit to Stand: Supervision         General transfer comment: supervision for safety     Balance Overall balance assessment: Modified Independent                                         ADL either performed or assessed with clinical judgement   ADL Overall ADL's : Needs assistance/impaired Eating/Feeding: Independent;Sitting   Grooming: Min guard;Standing   Upper Body Bathing: Set up;Sitting   Lower Body Bathing: Min guard;Sit to/from stand   Upper Body Dressing : Set up;Sitting   Lower Body  Dressing: Sit to/from stand;Minimal assistance   Toilet Transfer: Min guard;Ambulation;Regular Toilet;Grab bars   Toileting- Clothing Manipulation and Hygiene: Min guard;Sit to/from stand       Functional mobility during ADLs: Min guard       Vision Baseline Vision/History: Wears glasses Wears Glasses: Reading only Patient Visual Report: Other (comment)(reports blurring vision yesterday, reports this has subsided today ) Vision Assessment?: Yes Eye Alignment: Within Functional Limits Ocular Range of Motion: Within Functional Limits Tracking/Visual Pursuits: Able to track stimulus in all quads without difficulty                Pertinent Vitals/Pain Pain Assessment: No/denies pain     Hand Dominance Right   Extremity/Trunk Assessment Upper Extremity Assessment Upper Extremity Assessment: Overall WFL for tasks assessed   Lower Extremity Assessment Lower Extremity Assessment: Defer to PT evaluation RLE Deficits / Details: "right knee is bad"; strength grossly [facility]       Communication Communication Communication: No difficulties   Cognition Arousal/Alertness: Awake/alert Behavior During Therapy: WFL for tasks assessed/performed Overall Cognitive Status: Within Functional Limits for tasks assessed                                                      Home Living Family/patient expects  to be discharged to:: Private residence Living Arrangements: Spouse/significant other Available Help at Discharge: Family;Available 24 hours/day Type of Home: House Home Access: Stairs to enter [employer] of Steps: 3 Entrance Stairs-Rails: Right;Left Home Layout: Multi-level Alternate Level Stairs-Number of Steps: 7 steps up adn 7 steps down from main level, bedroom on lowest level   Bathroom Shower/Tub: Walk-in shower   Bathroom Toilet: Handicapped height     Home Equipment: Environmental consultant - 2 wheels;Bedside commode      Lives With: Spouse     Prior Functioning/Environment Level of Independence: Independent;Independent with assistive device(s)        Comments: using walking stick for ambulation         OT Problem List: Impaired balance (sitting and/or standing);Decreased activity tolerance            OT Goals(Current goals can be found in the care plan section) Acute Rehab OT Goals Patient Stated Goal: home soon OT Goal Formulation: All assessment and education complete, DC therapy                                 AM-PAC PT "6 Clicks" Daily Activity     Outcome Measure Help from another person eating meals?: None Help from another person taking care of personal grooming?: None Help from another person toileting, which includes using toliet, bedpan, or urinal?: None Help from another person bathing (including washing, rinsing, drying)?: A Little Help from another person to put on and taking off regular upper body clothing?: None Help from another person to put on and taking off regular lower body clothing?: A Little 6 Click Score: 22   End of Session Nurse Communication: Mobility status  Activity Tolerance: Patient tolerated treatment well Patient left: in bed;with call bell/phone within reach;with bed alarm set;with family/visitor present  OT Visit Diagnosis: Unsteadiness on feet (R26.81);Other symptoms and signs involving the nervous system (R29.898)                Time: 8921-1941 OT Time Calculation (min): 16 min Charges:  OT General Charges $OT Visit: 1 Visit OT Evaluation $OT Eval Low Complexity: 1 Low G-Codes:     Lou Cal, OT Pager [phone] 08/10/2017   Raymondo Band 08/10/2017, 4:24 PM

## 2017-08-10 NOTE — Progress Notes (Signed)
ANTICOAGULATION CONSULT NOTE - Initial Consult  Pharmacy Consult for Eliquis Indication: atrial fibrillation  No Known Allergies  Patient Measurements: Height: 6' (182.9 cm) Weight: 290 lb (131.5 kg) IBW/kg (Calculated) : 77.6  Vital Signs: Temp: 98.7 F (37.1 C) (11/29 1338) Temp Source: Oral (11/29 1338) BP: 134/40 (11/29 1338) Pulse Rate: 55 (11/29 1338)  Labs: Recent Labs    08/09/17 2045 08/09/17 2102  HGB 13.0 13.9  HCT 38.8* 41.0  PLT 881*  --   APTT 35  --   LABPROT 14.6  --   INR 1.15  --   CREATININE 1.15 1.10     Medical History: Past Medical History:  Diagnosis Date  . Anxiety   . Arthritis   . Cancer (HCC)    skin - basil cell  . Depression   . Diabetes mellitus without complication ([place])   . Dysrhythmia    a-fib  . GERD (gastroesophageal reflux disease)   . Hyperlipidemia   . Hypertension   . Neuropathy   . Obesity   . Paroxysmal atrial fibrillation (HCC)   . Peripheral vascular disease ([place])    diabetic neuropathy in both feet  . Sleep apnea    uses C-pap machine     Assessment: Pt with history of AFib who is on Xarelto prior to admission. He was admitted with aphasia, CTA shows L MCA infarct despite taking Xarelto. Planning to change patient over to Eliquis.    Goal of Therapy:  Monitor platelets by anticoagulation protocol: Yes    Plan:  -Eliquis 5 mg po bid -Monitor s/sx bleeding   Harvel Quale 08/10/2017,4:21 PM

## 2017-08-10 NOTE — Progress Notes (Signed)
  Echocardiogram 2D Echocardiogram has been performed.  Johny Chess 08/10/2017, 4:44 PM

## 2017-08-11 NOTE — Telephone Encounter (Signed)
2nd attempt to reach pt re: cardiac clearance and pt needs to schedule an appt. Left another message for pt to call back.

## 2017-08-12 ENCOUNTER — Encounter: Payer: Self-pay | Admitting: Interventional Cardiology

## 2017-08-16 NOTE — Telephone Encounter (Signed)
Called patient left message on personal voice mail to call office to schedule appointment to clear you for upcoming surgery. Left message with Dr.Aluisio's surgery scheduler Fabio Asa patient will need to be seen before clearing for surgery.

## 2017-08-22 ENCOUNTER — Ambulatory Visit: Payer: Self-pay | Admitting: Orthopedic Surgery

## 2017-08-22 DIAGNOSIS — I639 Cerebral infarction, unspecified: Secondary | ICD-10-CM | POA: Diagnosis not present

## 2017-08-22 DIAGNOSIS — R911 Solitary pulmonary nodule: Secondary | ICD-10-CM | POA: Diagnosis not present

## 2017-08-22 DIAGNOSIS — I10 Essential (primary) hypertension: Secondary | ICD-10-CM | POA: Diagnosis not present

## 2017-08-22 DIAGNOSIS — I5032 Chronic diastolic (congestive) heart failure: Secondary | ICD-10-CM | POA: Diagnosis not present

## 2017-08-22 DIAGNOSIS — E7849 Other hyperlipidemia: Secondary | ICD-10-CM | POA: Diagnosis not present

## 2017-08-22 DIAGNOSIS — I480 Paroxysmal atrial fibrillation: Secondary | ICD-10-CM | POA: Diagnosis not present

## 2017-08-22 DIAGNOSIS — E1149 Type 2 diabetes mellitus with other diabetic neurological complication: Secondary | ICD-10-CM | POA: Diagnosis not present

## 2017-08-22 DIAGNOSIS — Z6839 Body mass index (BMI) 39.0-39.9, adult: Secondary | ICD-10-CM | POA: Diagnosis not present

## 2017-08-23 NOTE — Telephone Encounter (Signed)
Leave message for pt to call back to schedule an appt.

## 2017-08-25 NOTE — Telephone Encounter (Signed)
Spoke with pt. He is scheduled to see Dr. Tamala Julian 09/28/17. But pt recently had a stroke 08/10/17, and that he would have to be cleared from a stroke prospective, and that Dr. Tamala Julian could only clear him from a Cardiac Standpoint,

## 2017-08-28 ENCOUNTER — Telehealth: Payer: Self-pay | Admitting: Neurology

## 2017-08-28 NOTE — Telephone Encounter (Signed)
Rn call patient about his right arm tingling, sometimes having numbness.Pt was discharge from the hospital on 08/10/2017. Pt does take gabapentin for neuropathy in his foot because of his diabetes. Pt stated the tingling started 08/19/2017 and he has over 8 episodes. Rn stated he was calling because he is not sure if its something major. Rn stated a message will be sent to [facility].

## 2017-08-28 NOTE — Telephone Encounter (Signed)
Patient requesting call back from nurse regarding arm tingling in right arm since stroke. Best call back is [phone]

## 2017-08-29 ENCOUNTER — Telehealth: Payer: Self-pay | Admitting: Neurology

## 2017-08-29 NOTE — Telephone Encounter (Signed)
I returned the patient's call and left message on his answering machine to call me back to discuss his complaints

## 2017-08-29 NOTE — Telephone Encounter (Signed)
I returned patient's phone call stating that his speech deficit from his stroke has improved significantly but he still has intermittent tingling and numbness in his right arm. A few other episodes have lasted more than a few minutes. I reassured her that this was likely progressive effect from his stroke and should improve over time. He is currently taking gabapentin. I advised him to call me back if the numbness gets worse in that case we may either increase gabapentin NOT in communication with Topamax. He has a follow-up appointment to see me in the end of January and at told him that I would try to see if I could see him sooner if an opening became available

## 2017-09-20 ENCOUNTER — Other Ambulatory Visit: Payer: Self-pay | Admitting: Neurology

## 2017-09-20 NOTE — Telephone Encounter (Signed)
PT was discharge with Eliquis. Pt does have a cardiologist he sees for atrial fibrillation. Pt was on  Xarelto but is now on eliquis. Pt sees [name] cardiac md. Pt is requesting refills on eliquis.Message sent to Dr. Leonie Man.

## 2017-09-20 NOTE — Telephone Encounter (Signed)
Message sent to DR. SEthi. Pts cardiologist was managing his xarelto before it was change to eliquis. Dr Leonie Man do you want to prescribed eliquis or forward it to his cardiologist Daneen Schick MD. Please advised.

## 2017-09-20 NOTE — Telephone Encounter (Signed)
Please send prescription for Eliquis 5 mg p.o. twice daily. Discontinue Xarelto.

## 2017-09-20 NOTE — Telephone Encounter (Signed)
Kindly forward it to [name]

## 2017-09-20 NOTE — Telephone Encounter (Signed)
Edie with [facility] said the pt is wanting to know if we can send a refill for apixaban (ELIQUIS) 5 MG TABS tablet the pt does have an appt on 1/30 but was discharged from the hospital with the medication. Any questions call [phone] or fax it over to [phone]

## 2017-09-21 MED ORDER — APIXABAN 5 MG PO TABS: 5.0000 mg | ORAL_TABLET | Freq: Two times a day (BID) | ORAL | 2 refills | Status: DC

## 2017-09-21 NOTE — Addendum Note (Signed)
Addended by: Loren Racer on: 09/21/2017 07:41 AM   Modules accepted: Orders

## 2017-09-25 ENCOUNTER — Other Ambulatory Visit: Payer: Self-pay | Admitting: Oncology

## 2017-09-25 DIAGNOSIS — I5032 Chronic diastolic (congestive) heart failure: Secondary | ICD-10-CM | POA: Diagnosis not present

## 2017-09-25 DIAGNOSIS — E119 Type 2 diabetes mellitus without complications: Secondary | ICD-10-CM

## 2017-09-25 DIAGNOSIS — I6330 Cerebral infarction due to thrombosis of unspecified cerebral artery: Secondary | ICD-10-CM

## 2017-09-25 DIAGNOSIS — E7849 Other hyperlipidemia: Secondary | ICD-10-CM | POA: Diagnosis not present

## 2017-09-25 DIAGNOSIS — Z794 Long term (current) use of insulin: Secondary | ICD-10-CM

## 2017-09-25 DIAGNOSIS — Z8673 Personal history of transient ischemic attack (TIA), and cerebral infarction without residual deficits: Secondary | ICD-10-CM | POA: Diagnosis not present

## 2017-09-25 DIAGNOSIS — I495 Sick sinus syndrome: Secondary | ICD-10-CM

## 2017-09-25 DIAGNOSIS — I480 Paroxysmal atrial fibrillation: Secondary | ICD-10-CM | POA: Diagnosis not present

## 2017-09-25 DIAGNOSIS — E1140 Type 2 diabetes mellitus with diabetic neuropathy, unspecified: Secondary | ICD-10-CM | POA: Diagnosis not present

## 2017-09-25 DIAGNOSIS — I10 Essential (primary) hypertension: Secondary | ICD-10-CM | POA: Diagnosis not present

## 2017-09-25 DIAGNOSIS — I779 Disorder of arteries and arterioles, unspecified: Secondary | ICD-10-CM | POA: Diagnosis not present

## 2017-09-25 DIAGNOSIS — D473 Essential (hemorrhagic) thrombocythemia: Secondary | ICD-10-CM | POA: Insufficient documentation

## 2017-09-25 DIAGNOSIS — D689 Coagulation defect, unspecified: Secondary | ICD-10-CM

## 2017-09-25 DIAGNOSIS — E559 Vitamin D deficiency, unspecified: Secondary | ICD-10-CM | POA: Diagnosis not present

## 2017-09-25 DIAGNOSIS — E11319 Type 2 diabetes mellitus with unspecified diabetic retinopathy without macular edema: Secondary | ICD-10-CM | POA: Diagnosis not present

## 2017-09-25 DIAGNOSIS — E1142 Type 2 diabetes mellitus with diabetic polyneuropathy: Secondary | ICD-10-CM | POA: Diagnosis not present

## 2017-09-25 DIAGNOSIS — N401 Enlarged prostate with lower urinary tract symptoms: Secondary | ICD-10-CM | POA: Diagnosis not present

## 2017-09-25 DIAGNOSIS — D75839 Thrombocytosis, unspecified: Secondary | ICD-10-CM

## 2017-09-25 DIAGNOSIS — Z6839 Body mass index (BMI) 39.0-39.9, adult: Secondary | ICD-10-CM | POA: Diagnosis not present

## 2017-09-26 ENCOUNTER — Telehealth: Payer: Self-pay | Admitting: Oncology

## 2017-09-26 ENCOUNTER — Other Ambulatory Visit: Payer: Self-pay | Admitting: Oncology

## 2017-09-26 NOTE — Telephone Encounter (Signed)
Spoke with patient regarding appointment D/T/Loc/Ph# °

## 2017-09-27 ENCOUNTER — Telehealth: Payer: Self-pay | Admitting: Oncology

## 2017-09-27 ENCOUNTER — Other Ambulatory Visit: Payer: Self-pay | Admitting: Oncology

## 2017-09-27 DIAGNOSIS — D75839 Thrombocytosis, unspecified: Secondary | ICD-10-CM

## 2017-09-27 DIAGNOSIS — D473 Essential (hemorrhagic) thrombocythemia: Secondary | ICD-10-CM

## 2017-09-27 NOTE — Telephone Encounter (Signed)
Patient was previously scheduled. Changed date and time per Provider request 1/16.

## 2017-09-28 ENCOUNTER — Encounter: Payer: Self-pay | Admitting: Interventional Cardiology

## 2017-09-28 ENCOUNTER — Telehealth: Payer: Self-pay

## 2017-09-28 ENCOUNTER — Ambulatory Visit: Payer: Medicare HMO | Admitting: Interventional Cardiology

## 2017-09-28 VITALS — BP 154/74 | HR 68 | Ht 72.0 in | Wt 290.2 lb

## 2017-09-28 DIAGNOSIS — I10 Essential (primary) hypertension: Secondary | ICD-10-CM | POA: Diagnosis not present

## 2017-09-28 DIAGNOSIS — Z7901 Long term (current) use of anticoagulants: Secondary | ICD-10-CM | POA: Diagnosis not present

## 2017-09-28 DIAGNOSIS — G4733 Obstructive sleep apnea (adult) (pediatric): Secondary | ICD-10-CM | POA: Diagnosis not present

## 2017-09-28 DIAGNOSIS — I5032 Chronic diastolic (congestive) heart failure: Secondary | ICD-10-CM

## 2017-09-28 DIAGNOSIS — I480 Paroxysmal atrial fibrillation: Secondary | ICD-10-CM

## 2017-09-28 DIAGNOSIS — I495 Sick sinus syndrome: Secondary | ICD-10-CM | POA: Diagnosis not present

## 2017-09-28 DIAGNOSIS — D75839 Thrombocytosis, unspecified: Secondary | ICD-10-CM

## 2017-09-28 DIAGNOSIS — D473 Essential (hemorrhagic) thrombocythemia: Secondary | ICD-10-CM

## 2017-09-28 NOTE — Telephone Encounter (Signed)
Rn call pts dentist office concerning a clearance form for deep cleaning. Rn stated to Wells Guiles that pt has not been seen in the office for a hospital follow up. Pt had stroke in 07/2017. His first appt is January 30,2019 with Dr. Leonie Man. Rn stated pt is on eliquis. Rn stated the form cannot be completed until pt is seen in the office. Wells Guiles stated to keep the form, and fax it or call if completed.

## 2017-09-28 NOTE — Patient Instructions (Signed)
Medication Instructions:  Your physician recommends that you continue on your current medications as directed. Please refer to the Current Medication list given to you today.  Labwork: None  Testing/Procedures: None  Follow-Up: Your physician recommends that you schedule a follow-up appointment in: 3 months with Dr. Smith.    Any Other Special Instructions Will Be Listed Below (If Applicable).     If you need a refill on your cardiac medications before your next appointment, please call your pharmacy.   

## 2017-09-28 NOTE — Progress Notes (Signed)
Cardiology Office Note    Date:  09/28/2017   ID:  [name], DOB April 17, 1941, MRN [id number]  PCP:  [name], MD  Cardiologist: [name], MD   Chief Complaint  Patient presents with  . Atrial Fibrillation    History of Present Illness:  [name] is a 77 y.o. male who presents for asymptomatic atrial fibrillation, obstructive sleep apnea, hypertension, diabetes, and hyperlipidemia.  He had a recent embolic CVA felt to be secondary to failure of Xarelto to adequately anticoagulate in the setting of atrial fibrillation.  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  [name] is doing well.  He had a stroke back in October 2018.  It was felt to be embolic.  He was on Xarelto but there was some question of compliance.  He was evaluated at and found to have had prior strokes.  He was started on Eliquis and Xarelto was discontinued.   Past Medical History:  Diagnosis Date  . Anxiety   . Arthritis   . Cancer (HCC)    skin - basil cell  . Depression   . Diabetes mellitus without complication ([place])   . Dysrhythmia    a-fib  . GERD (gastroesophageal reflux disease)   . Hyperlipidemia   . Hypertension   . Neuropathy   . Obesity   . Paroxysmal atrial fibrillation (HCC)   . Peripheral vascular disease ([place])    diabetic neuropathy in both feet  . Sleep apnea    uses C-pap machine    Past Surgical History:  Procedure Laterality Date  . APPENDECTOMY  1962  . BACK SURGERY  00-02-12   x3  . BASAL CELL CARCINOMA EXCISION  93/06/10  . COLONOSCOPY    . KNEE ARTHROSCOPY  005/01/02  . 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 ALPRAZolam (XANAX) 0.5 MG tablet Take 0.5 mg by mouth 2 (two) times  daily.     [name] Kitchen apixaban (ELIQUIS) 5 MG TABS tablet Take 1 tablet (5 mg total) by mouth 2 (two) times daily. 180 tablet 2  . atorvastatin (LIPITOR) 80 MG tablet Take 1 tablet (80 mg total) by mouth daily at 6 PM. 30 tablet 0  . benazepril (LOTENSIN) 40 MG tablet Take 1 tablet (40 mg total) by mouth daily.    . Canagliflozin (INVOKANA) 100 MG TABS Take 50 mg by mouth 2 (two) times daily.     . Cholecalciferol (VITAMIN D3) 50000 units CAPS Take 1 capsule by mouth 2 (two) times a week.    . furosemide (LASIX) 20 MG tablet Take 20 mg by mouth daily.     [name] Kitchen gabapentin (NEURONTIN) 300 MG capsule Take 300 mg by mouth at bedtime.     . Homeopathic Products (NERVE PAIN RELIEF SL) Place 1 tablet under the tongue 2 (two) times daily.    . insulin NPH-regular Human (NOVOLIN 70/30) (70-30) 100 UNIT/ML injection Inject 44 Units into the skin 2 (two) times daily with a meal.    . metFORMIN (GLUCOPHAGE) 500 MG tablet Take 500 mg by mouth 2 (two) times daily with a meal.     . Multiple Vitamins-Minerals (MULTIVITAMIN ADULT PO) Take 1 tablet by mouth daily.    [name] Kitchen omeprazole (PRILOSEC) 20 MG capsule  Take 20 mg by mouth daily.    [name] Kitchen venlafaxine XR (EFFEXOR-XR) 75 MG 24 hr capsule Take 75 mg by mouth daily.     No facility-administered medications prior to visit.      Allergies:   Patient has no known allergies.   Social History   Socioeconomic History  . Marital status: Married    Spouse name: None  . Number of children: None  . Years of education: None  . Highest education level: None  Social Needs  . Financial resource strain: None  . Food insecurity - worry: None  . Food insecurity - inability: None  . Transportation needs - medical: None  . Transportation needs - non-medical: None  Occupational History  . None  Tobacco Use  . Smoking status: Former Smoker    Types: Cigars    Last attempt to quit: 08/02/1992    Years since quitting: 25.1  . Smokeless tobacco: Never Used  Substance and Sexual  Activity  . Alcohol use: Yes    Alcohol/week: 0.0 oz    Comment: 1 beer a night.   . Drug use: No  . Sexual activity: None  Other Topics Concern  . None  Social History Narrative  . None     Family History:  The patient's family history includes CVA in his father; Cancer in his mother; Diabetes Mellitus II in his mother; Heart failure in his father; Hypertension in his mother and sister.   ROS:   Please see the history of present illness.    Not using CPAP.  Wants to have his knee operated upon.  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 All other systems reviewed and are negative.   PHYSICAL EXAM:   VS:  BP (!) 154/74   Pulse 68   Ht 6' (1.829 m)   Wt 290 lb 3.2 oz (131.6 kg)   BMI 39.36 kg/m    GEN: Well nourished, well developed, in no acute distress.  Morbid obesity. HEENT: normal  Neck: no JVD, carotid bruits, or masses Cardiac: RRR; no murmurs, rubs, or gallops,no edema  Respiratory:  clear to auscultation bilaterally, normal work of breathing GI: soft, nontender, nondistended, + BS MS: no deformity or atrophy  Skin: warm and dry, no rash Neuro:  Alert and Oriented x 3, Strength and sensation are intact Psych: euthymic mood, full affect  Wt Readings from Last 3 Encounters:  09/28/17 290 lb 3.2 oz (131.6 kg)  08/10/17 290 lb (131.5 kg)  08/24/16 280 lb 12.8 oz (127.4 kg)      Studies/Labs Reviewed:   EKG:  EKG the electrocardiogram was not repeated today.  Recent Labs: 08/09/2017: ALT 24; BUN 19; Creatinine, Ser 1.10; Hemoglobin 13.9; Platelets 881; Potassium 4.4; Sodium 137   Lipid Panel No results found for: CHOL, TRIG, HDL, CHOLHDL, VLDL, LDLCALC, LDLDIRECT  Additional studies/ records that were reviewed today include:  2D Doppler echocardiogram August 10, 2017:  Study Conclusions   - Left ventricle: The cavity size was normal. Wall thickness was   increased in a pattern of mild LVH. Systolic function was normal.    The estimated ejection fraction was in the range of 55% to 60%.     ASSESSMENT:    1. Chronic diastolic CHF (congestive heart failure) ([place])   2. Tachycardia-bradycardia syndrome (HCC)   3. Paroxysmal atrial fibrillation ([place])   4. Essential hypertension   5. Thrombocythemia ([place])   6. Chronic anticoagulation   7. Obstructive sleep  apnea      PLAN:  In order of problems listed above:  1. No evidence of volume overload 2. Currently stable.  Patient not indicated.  Currently in sinus rhythm clinically. 3. Continue Eliquis anticoagulation therapy. 4. Blood pressure control, repeated and was 128/72 mmHg.  Low-salt diet and weight reduction are advocated. 5. Most recent platelet count was 880,000 in November 2018.  Apparently subsequent evaluation with Dr. Forde Dandy documented platelet counts greater than 1 million.  Patient is said to see hematology.  This is interesting and may have some impact/role in his recent embolic stroke and the failure of Xarelto to give protection from presumed cardioembolic event related to A. Fib.  He has multiple questions including when he can have his knee operated upon.  During the next 6-12 months after his stroke, discontinuation of anticoagulation therapy is not advisable since there is high risk of recurrent stroke in the first year following an event.  No change in the current medical regimen is recommended.  Will await correlation between the patient's clinical conditions and platelet evaluation.  There are no cardiac limitations as far as exercise goes.  I encouraged him to resume CPAP.  The patient had a prolonged office visit with greater than 50% of the 40 minutes spent in counseling and coordination of care.   Medication Adjustments/Labs and Tests Ordered: Current medicines are reviewed at length with the patient today.  Concerns regarding medicines are outlined above.  Medication changes, Labs and Tests ordered today are listed in the Patient  Instructions below. Patient Instructions  Medication Instructions:  Your physician recommends that you continue on your current medications as directed. Please refer to the Current Medication list given to you today.  Labwork: None  Testing/Procedures: None  Follow-Up: Your physician recommends that you schedule a follow-up appointment in: 3 months with Dr. Tamala Julian.    Any Other Special Instructions Will Be Listed Below (If Applicable).     If you need a refill on your cardiac medications before your next appointment, please call your pharmacy.      Signed, [name], MD  09/28/2017 5:24 PM    [address], [place], [place]  [address] Phone: [phone]; Fax: [phone]

## 2017-09-29 ENCOUNTER — Other Ambulatory Visit: Payer: Self-pay | Admitting: Oncology

## 2017-09-29 ENCOUNTER — Inpatient Hospital Stay: Payer: Medicare HMO | Attending: Oncology

## 2017-09-29 DIAGNOSIS — E119 Type 2 diabetes mellitus without complications: Secondary | ICD-10-CM

## 2017-09-29 DIAGNOSIS — I10 Essential (primary) hypertension: Secondary | ICD-10-CM | POA: Insufficient documentation

## 2017-09-29 DIAGNOSIS — E669 Obesity, unspecified: Secondary | ICD-10-CM | POA: Diagnosis not present

## 2017-09-29 DIAGNOSIS — D689 Coagulation defect, unspecified: Secondary | ICD-10-CM

## 2017-09-29 DIAGNOSIS — Z794 Long term (current) use of insulin: Secondary | ICD-10-CM | POA: Diagnosis not present

## 2017-09-29 DIAGNOSIS — F419 Anxiety disorder, unspecified: Secondary | ICD-10-CM | POA: Insufficient documentation

## 2017-09-29 DIAGNOSIS — K219 Gastro-esophageal reflux disease without esophagitis: Secondary | ICD-10-CM | POA: Diagnosis not present

## 2017-09-29 DIAGNOSIS — G4730 Sleep apnea, unspecified: Secondary | ICD-10-CM | POA: Insufficient documentation

## 2017-09-29 DIAGNOSIS — Z79899 Other long term (current) drug therapy: Secondary | ICD-10-CM | POA: Diagnosis not present

## 2017-09-29 DIAGNOSIS — Z85828 Personal history of other malignant neoplasm of skin: Secondary | ICD-10-CM | POA: Insufficient documentation

## 2017-09-29 DIAGNOSIS — I495 Sick sinus syndrome: Secondary | ICD-10-CM

## 2017-09-29 DIAGNOSIS — E785 Hyperlipidemia, unspecified: Secondary | ICD-10-CM | POA: Diagnosis not present

## 2017-09-29 DIAGNOSIS — D473 Essential (hemorrhagic) thrombocythemia: Secondary | ICD-10-CM | POA: Insufficient documentation

## 2017-09-29 DIAGNOSIS — D75839 Thrombocytosis, unspecified: Secondary | ICD-10-CM

## 2017-09-29 DIAGNOSIS — R69 Illness, unspecified: Secondary | ICD-10-CM | POA: Diagnosis not present

## 2017-09-29 DIAGNOSIS — E1151 Type 2 diabetes mellitus with diabetic peripheral angiopathy without gangrene: Secondary | ICD-10-CM | POA: Insufficient documentation

## 2017-09-29 DIAGNOSIS — I6330 Cerebral infarction due to thrombosis of unspecified cerebral artery: Secondary | ICD-10-CM

## 2017-09-29 DIAGNOSIS — F329 Major depressive disorder, single episode, unspecified: Secondary | ICD-10-CM | POA: Diagnosis not present

## 2017-09-29 DIAGNOSIS — I480 Paroxysmal atrial fibrillation: Secondary | ICD-10-CM | POA: Insufficient documentation

## 2017-09-29 DIAGNOSIS — Z87891 Personal history of nicotine dependence: Secondary | ICD-10-CM | POA: Diagnosis not present

## 2017-09-29 LAB — CBC WITH DIFFERENTIAL/PLATELET
BASOS PCT: 2 %
Basophils Absolute: 0.2 10*3/uL — ABNORMAL HIGH (ref 0.0–0.1)
Eosinophils Absolute: 0.2 10*3/uL (ref 0.0–0.5)
Eosinophils Relative: 2 %
HCT: 42.2 % (ref 38.4–49.9)
Hemoglobin: 14 g/dL (ref 13.0–17.1)
LYMPHS ABS: 1.6 10*3/uL (ref 0.9–3.3)
Lymphocytes Relative: 15 %
MCH: 28.7 pg (ref 27.2–33.4)
MCHC: 33.2 g/dL (ref 32.0–36.0)
MCV: 86.7 fL (ref 79.3–98.0)
MONOS PCT: 6 %
Monocytes Absolute: 0.6 10*3/uL (ref 0.1–0.9)
Neutro Abs: 8 10*3/uL — ABNORMAL HIGH (ref 1.5–6.5)
Neutrophils Relative %: 75 %
Platelets: 895 10*3/uL — ABNORMAL HIGH (ref 140–400)
RBC: 4.87 MIL/uL (ref 4.20–5.82)
RDW: 16.5 % — ABNORMAL HIGH (ref 11.0–15.6)
WBC: 10.6 10*3/uL — ABNORMAL HIGH (ref 4.0–10.3)

## 2017-09-29 LAB — LACTATE DEHYDROGENASE: LDH: 209 U/L (ref 125–245)

## 2017-09-29 LAB — COMPREHENSIVE METABOLIC PANEL
ALT: 34 U/L (ref 0–55)
ANION GAP: 8 (ref 3–11)
AST: 25 U/L (ref 5–34)
Albumin: 3.5 g/dL (ref 3.5–5.0)
Alkaline Phosphatase: 131 U/L (ref 40–150)
BUN: 13 mg/dL (ref 7–26)
CO2: 25 mmol/L (ref 22–29)
Calcium: 8.9 mg/dL (ref 8.4–10.4)
Chloride: 103 mmol/L (ref 98–109)
Creatinine, Ser: 1 mg/dL (ref 0.70–1.30)
GFR calc non Af Amer: >60 mL/min (ref >60)
Glucose, Bld: 311 mg/dL — ABNORMAL HIGH (ref 70–140)
Potassium: 4.6 mmol/L (ref 3.5–5.1)
SODIUM: 136 mmol/L (ref 136–145)
Total Bilirubin: 0.8 mg/dL (ref 0.2–1.2)
Total Protein: 6.7 g/dL (ref 6.4–8.3)

## 2017-09-29 LAB — FERRITIN: Ferritin: 143 ng/mL (ref 22–316)

## 2017-09-29 LAB — SAVE SMEAR

## 2017-09-29 LAB — C-REACTIVE PROTEIN

## 2017-09-29 LAB — IRON AND TIBC
IRON: 166 ug/dL — AB (ref 42–163)
Saturation Ratios: 76 % (ref 42–163)
TIBC: 219 ug/dL (ref 202–409)
UIBC: 53 ug/dL

## 2017-10-05 NOTE — Progress Notes (Signed)
[place]  Telephone:(336) [phone] Fax:(336) [phone]     ID: LEX LINHARES DOB: [birth year]  MR#: [id number]  CZY#:[id number]  Patient Care Team: [name], MD as PCP - General (Endocrinology) Tenessa Marsee, Virgie Dad, MD as Consulting Physician (Oncology) Belva Crome, MD as Consulting Physician (Cardiology) Irene Shipper, MD as Consulting Physician (Gastroenterology) Lavonna Monarch, MD as Consulting Physician (Dermatology) Roel Cluck, MD as Referring Physician (Ophthalmology) Garvin Fila, MD as Consulting Physician (Neurology) Bobetta Lime, MD OTHER MD:  CHIEF COMPLAINT: Essential thrombocytosis  CURRENT TREATMENT: To start hydroxyurea   HISTORY OF CURRENT ILLNESS: Dr. Forde Dandy obtained lab work on [name] 09/25/2017 showing a platelet count of 1,035,000.  He referred him at that point for further evaluation and sent [place] his prior lab work.  The earliest platelet count I have is from 03/18/2013.  It was normal then at 263,000.  On 07/12/2015 the platelet count was 559,000.  On 08/10/2016 the platelet count was 725,000.  On 08/09/2017 the platelet count was 881,000  Prior to today's visit I se upt [name] for some additional lab work.  This was performed 09/29/2017.  On that date his white cell count was 10.4, with the absolute neutrophil count 8.0.  Hemoglobin was 14.0 with an MCV of 86.7.  The platelet count was 895,000  The labs obtained on that date included a ferritin of 103, iron saturation of 76%, C-reactive protein less than 0.8, LDH 209 and BCR/ABL not detected.  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  The patient's subsequent history is as detailed below.  INTERVAL HISTORY: Matias was evaluated in the hematology clinic 10/06/2017.   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  Ensure he used to be very active but now he has significant knee problems and is not exercising regularly.  He tells me he has significant neuropathy from his diabetes which also does not help.  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  The speech problem has resolved but his right arm sometimes feels numb or tingly he says.  He is scheduled to meet with neurology next week for follow-up.  He is on apixaban for his atrial fibrillation and history of stroke.  He denies any overt bleeding.  He does have some easy bruising.  Aside from these issues a detailed review of systems today was noncontributory  PAST MEDICAL HISTORY: Past Medical History:  Diagnosis Date  . Anxiety   . Arthritis   . Cancer (HCC)    skin - basil cell  . Depression   . Diabetes mellitus without complication ([place])   . Dysrhythmia    a-fib  . GERD (gastroesophageal reflux disease)   . Hyperlipidemia   . Hypertension   . Neuropathy   . Obesity   . Paroxysmal atrial fibrillation (HCC)   . Peripheral vascular disease ([place])    diabetic neuropathy in both feet  . Sleep apnea    uses C-pap machine    PAST SURGICAL HISTORY: Past Surgical History:  Procedure Laterality Date  . APPENDECTOMY  1962  . BACK SURGERY  00-02-12   x3  . BASAL CELL CARCINOMA EXCISION  93/06/10  . COLONOSCOPY    . KNEE ARTHROSCOPY  005/01/02  . 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 Cancer Mother   . Diabetes Mellitus II Mother   . Hypertension Mother   . Heart failure Father   . CVA Father   . Hypertension Sister   . Colon cancer Neg Hx   Patient's father died at age 35 shortly  following a CABG.  The patient's mother died at age 15 from cancer but the patient does not know what type.  The patient has 3 brothers, 1 sister.  The sister died at age 36 following a stroke.  One brother had heart disease but is now doing well.  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  He is now retired.  He has 2 children from his first marriage, Dellis Filbert, who is a Music therapist and a med Designer, multimedia in [place], 52, and [place], who works for the [employer] in [place], [place].  The patient has 4 biological grandchildren.  His second wife, Webb Silversmith, has 2 children of her own, Hilliard Clark, 52, who is in [place] and works as a Higher education careers adviser at [employer], 44, who is a Management consultant in [place].  The patient is not a church attender    ADVANCED DIRECTIVES:    HEALTH MAINTENANCE: Social History   Tobacco Use  . Smoking status: Former Smoker    Types: Cigars    Last attempt to quit: 08/02/1992    Years since quitting: 25.1  . Smokeless tobacco: Never Used  Substance Use Topics  . Alcohol use: Yes    Alcohol/week: 0.0 oz    Comment: 1 beer a night.   . Drug use: No     Colonoscopy: 05/12/2015  Bone density:  PSA:   No Known Allergies  Current Outpatient Medications  Medication Sig Dispense Refill  . ALPRAZolam (XANAX) 0.5 MG tablet Take 0.5 mg by mouth 2 (two) times daily.     [name] Kitchen apixaban (ELIQUIS) 5 MG TABS tablet Take 1 tablet (5 mg total) by mouth 2 (two) times daily. 180 tablet 2  . atorvastatin (LIPITOR) 80 MG tablet Take 1 tablet (80 mg total) by mouth daily at 6 PM. 30 tablet 0  . benazepril (LOTENSIN) 40 MG tablet Take 1 tablet (40 mg total) by mouth daily.    . Canagliflozin (INVOKANA) 100 MG TABS Take 50 mg by mouth 2 (two) times daily.     . Cholecalciferol (VITAMIN D3) 50000 units CAPS Take 1 capsule by mouth 2 (two) times a week.    . furosemide (LASIX) 20 MG tablet Take 20 mg by mouth daily.     [name] Kitchen gabapentin  (NEURONTIN) 300 MG capsule Take 300 mg by mouth at bedtime.     . Homeopathic Products (NERVE PAIN RELIEF SL) Place 1 tablet under the tongue 2 (two) times daily.    . hydroxyurea (HYDREA) 500 MG capsule Take 2 capsules (1,000 mg total) by mouth daily. May take with food to minimize GI side effects. 60 capsule 6  . insulin NPH-regular Human (NOVOLIN 70/30) (70-30) 100 UNIT/ML injection Inject 44 Units into the skin 2 (two) times daily with a meal.    . metFORMIN (GLUCOPHAGE) 500 MG tablet Take 500 mg by mouth 2 (two) times daily with a meal.     .  Multiple Vitamins-Minerals (MULTIVITAMIN ADULT PO) Take 1 tablet by mouth daily.    [name] Kitchen omeprazole (PRILOSEC) 20 MG capsule Take 20 mg by mouth daily.    [name] Kitchen venlafaxine XR (EFFEXOR-XR) 75 MG 24 hr capsule Take 75 mg by mouth daily.     No current facility-administered medications for this visit.     OBJECTIVE: Morbidly obese white man in no acute distress  Vitals:   10/06/17 1016  BP: (!) 129/58  Pulse: 71  Resp: 20  Temp: (!) 97.5 F (36.4 C)  SpO2: 98%     Body mass index is 39.09 kg/m.   Wt Readings from Last 3 Encounters:  10/06/17 288 lb 3.2 oz (130.7 kg)  09/28/17 290 lb 3.2 oz (131.6 kg)  08/10/17 290 lb (131.5 kg)      ECOG FS:1 - Symptomatic but completely ambulatory  Ocular: Sclerae unicteric, pupils round and equal Ear-nose-throat: Oropharynx clear and moist Lymphatic: No cervical or supraclavicular adenopathy Lungs no rales or rhonchi Heart regular rate and rhythm Abd soft, obese, nontender, positive bowel sounds; do not palpate a spleen tip, but insensitive exam  MSK no focal spinal tenderness Neuro: non-focal, clear speech, well-oriented, appropriate affect  LAB RESULTS:  CMP     Component Value Date/Time   NA 136 09/29/2017 1358   K 4.6 09/29/2017 1358   CL 103 09/29/2017 1358   CO2 25 09/29/2017 1358   GLUCOSE 311 (H) 09/29/2017 1358   BUN 13 09/29/2017 1358   CREATININE 1.00 09/29/2017 1358   CALCIUM 8.9  09/29/2017 1358   PROT 6.7 09/29/2017 1358   ALBUMIN 3.5 09/29/2017 1358   AST 25 09/29/2017 1358   ALT 34 09/29/2017 1358   ALKPHOS 131 09/29/2017 1358   BILITOT 0.8 09/29/2017 1358   GFRNONAA >60 09/29/2017 1358   GFRAA >60 09/29/2017 1358    No results found for: TOTALPROTELP, ALBUMINELP, A1GS, A2GS, BETS, BETA2SER, GAMS, MSPIKE, SPEI  No results found for: KPAFRELGTCHN, LAMBDASER, KAPLAMBRATIO  Lab Results  Component Value Date   WBC 10.6 (H) 09/29/2017   NEUTROABS 8.0 (H) 09/29/2017   HGB 14.0 09/29/2017   HCT 42.2 09/29/2017   MCV 86.7 09/29/2017   PLT 895 (H) 09/29/2017    '@LASTCHEMISTRY' @  No results found for: LABCA2  No components found for: RCBULA453  No results for input(s): INR in the last 168 hours.  No results found for: LABCA2  No results found for: MIW803  No results found for: OZY248  No results found for: GNO037  No results found for: CA2729  No components found for: HGQUANT  No results found for: CEA1 / No results found for: CEA1   No results found for: AFPTUMOR  No results found for: CHROMOGRNA  No results found for: PSA1  No visits with results within 3 Day(s) from this visit.  Latest known visit with results is:  Appointment on 09/29/2017  Component Date Value Ref Range Status  . WBC 09/29/2017 10.6* 4.0 - 10.3 K/uL Final  . RBC 09/29/2017 4.87  4.20 - 5.82 MIL/uL Final  . Hemoglobin 09/29/2017 14.0  13.0 - 17.1 g/dL Final  . HCT 09/29/2017 42.2  38.4 - 49.9 % Final  . MCV 09/29/2017 86.7  79.3 - 98.0 fL Final  . MCH 09/29/2017 28.7  27.2 - 33.4 pg Final  . MCHC 09/29/2017 33.2  32.0 - 36.0 g/dL Final  . RDW 09/29/2017 16.5* 11.0 - 15.6 % Final  . Platelets 09/29/2017 895* 140 - 400 K/uL Final  . Neutrophils  Relative % 09/29/2017 75  % Final  . Neutro Abs 09/29/2017 8.0* 1.5 - 6.5 K/uL Final  . Lymphocytes Relative 09/29/2017 15  % Final  . Lymphs Abs 09/29/2017 1.6  0.9 - 3.3 K/uL Final  . Monocytes Relative 09/29/2017 6  %  Final  . Monocytes Absolute 09/29/2017 0.6  0.1 - 0.9 K/uL Final  . Eosinophils Relative 09/29/2017 2  % Final  . Eosinophils Absolute 09/29/2017 0.2  0.0 - 0.5 K/uL Final  . Basophils Relative 09/29/2017 2  % Final  . Basophils Absolute 09/29/2017 0.2* 0.0 - 0.1 K/uL Final   Performed at [facility], [place] [address] [place], [place] [address]  . Ferritin 09/29/2017 143  22 - 316 ng/mL Final   Performed at [facility], [place] [address] [place], [place] [address]  . Iron 09/29/2017 166* 42 - 163 ug/dL Final  . TIBC 09/29/2017 219  202 - 409 ug/dL Final  . Saturation Ratios 09/29/2017 76  42 - 163 % Final  . UIBC 09/29/2017 53  ug/dL Final   Performed at [facility], [place] [address], [place], [place] [address]  . Sodium 09/29/2017 136  136 - 145 mmol/L Final  . Potassium 09/29/2017 4.6  3.5 - 5.1 mmol/L Final  . Chloride 09/29/2017 103  98 - 109 mmol/L Final  . CO2 09/29/2017 25  22 - 29 mmol/L Final  . Glucose, Bld 09/29/2017 311* 70 - 140 mg/dL Final  . BUN 09/29/2017 13  7 - 26 mg/dL Final  . Creatinine, Ser 09/29/2017 1.00  0.70 - 1.30 mg/dL Final  . Calcium 09/29/2017 8.9  8.4 - 10.4 mg/dL Final  . Total Protein 09/29/2017 6.7  6.4 - 8.3 g/dL Final  . Albumin 09/29/2017 3.5  3.5 - 5.0 g/dL Final  . AST 09/29/2017 25  5 - 34 U/L Final  . ALT 09/29/2017 34  0 - 55 U/L Final  . Alkaline Phosphatase 09/29/2017 131  40 - 150 U/L Final  . Total Bilirubin 09/29/2017 0.8  0.2 - 1.2 mg/dL Final  . GFR calc non Af Amer 09/29/2017 >60  >60 mL/min Final  . GFR calc Af Amer 09/29/2017 >60  >60 mL/min Final   Comment: (NOTE) The eGFR has been calculated using the CKD EPI equation. This calculation has not been validated in all clinical situations. eGFR's persistently <60 mL/min signify possible Chronic Kidney Disease.   Georgiann Hahn gap 09/29/2017 8  3 - 11 Final   Performed at [facility], [place] [address], [place], [place] [address]  . LDH 09/29/2017 209  125 - 245 U/L Final   Performed at [facility], [place] [address] [place], [place] [address]  . CRP 09/29/2017 <0.8  <1.0 mg/dL Final   Performed at [place] [address], [place], [place] [address]  . Smear Review 09/29/2017 SMEAR STAINED AND AVAILABLE FOR REVIEW   Final   Performed at [facility], [address], [place],  [address]    (this displays the last labs from the last 3 days)  No results found for: TOTALPROTELP, ALBUMINELP, A1GS, A2GS, BETS, BETA2SER, GAMS, MSPIKE, SPEI (this displays SPEP labs)  No results found for: KPAFRELGTCHN, LAMBDASER, KAPLAMBRATIO (kappa/lambda light chains)  No results found for: HGBA, HGBA2QUANT, HGBFQUANT, HGBSQUAN (Hemoglobinopathy evaluation)   Lab Results  Component Value Date   LDH 209 09/29/2017    Lab Results  Component Value Date   IRON 166 (H) 09/29/2017   TIBC 219 09/29/2017   IRONPCTSAT 76 09/29/2017   (Iron and TIBC)  Lab Results  Component Value Date   FERRITIN 143 09/29/2017    Urinalysis    Component Value Date/Time   COLORURINE YELLOW 10/26/2015 1013   APPEARANCEUR CLEAR 10/26/2015 1013   LABSPEC 1.017 10/26/2015 1013   PHURINE 5.0 10/26/2015 1013   GLUCOSEU >1000 (A) 10/26/2015 1013   HGBUR NEGATIVE 10/26/2015 1013   BILIRUBINUR NEGATIVE 10/26/2015 1013   KETONESUR NEGATIVE 10/26/2015 1013   PROTEINUR NEGATIVE 10/26/2015 1013   NITRITE NEGATIVE 10/26/2015 1013   LEUKOCYTESUR NEGATIVE 10/26/2015 1013     STUDIES: Brain MRI 08/10/2017 found small foci of acute infarction in the posterior left MCA territory.  REVIEW OF BLOOD FILM: Blood film from 09/29/2017, there is no significant red cell abnormality, and particularly no poikilocytosis or nucleated red cells.  There are no platelet clumps and no giant platelets.  There is minimal left shift in the white cell series, with a  few bands.    ELIGIBLE FOR AVAILABLE RESEARCH PROTOCOL: no  ASSESSMENT: 77 y.o. [place] Man with a platelet count in excess of 1 million 09/25/2017, a positive Jak2 V617F mutation, and normal iron studies, normal C-reactive protein, and negative BCR-ABL by PCR  (1) essential thrombocytosis: Started on Hydrea 1000 mg a day as of 10/06/2017  (2) bone marrow biopsy discussed, postponed  PLAN: We spent the better part of today's hour-long appointment discussing the biology of his diagnosis and the specifics of his situation.  Linden understands that all blood cells are made in the marrow of bones and that for that reason we frequently do perform bone marrow biopsies in patients who have hematologic issues.  There are 3 cell lines.  Red cells carry oxygen.  His red cells are normal in color and morphology.  White cells are involved in infection and inflammation.  His white cells are slightly elevated in number and minimally left shifted.  There is no evidence of leukemia by examination of the blood film.  The third cell type of course is the platelets.  These are clotting cells.  His platelet count has been more than twice the upper limit of normal for at least a month.  We have ruled out the most common reasons for elevated platelet counts which are inflammation or iron deficiency.  We have ruled out chronic myeloid leukemia, which can masquerade as ET.  Finally we have found a Jak 2 mutation which is consistent with a diagnosis of essential thrombocytosis.  The current criteria to diagnose essential thrombocytosis do call for bone marrow biopsy.  We discussed performing this test.  However I am very comfortable that he does have essential thrombocytosis and given his significant comorbidities, which are much more likely to limit his life than his current hematologic issues, we are going with that diagnosis and postponing a bone marrow biopsy until there are some provocative findings suggesting it  might be useful.  He is comfortable with this decision as well  We then discussed treatment of ET.  The goal is not only to bring the platelet count down to a normal level but also to control the WBC count. This is best done with Hydrea, and we are starting him on 1000 mg/ day. This drug is generally well tolerated but he will call me if he has any unusual symptoms he thinks may be related to this drug.  We will follow his  counts in a week and then every 2 weeks until he is on a stable (and likely lower) dose of hydrea. After that we will broaden the lab check intervals.  He will see me again in May. Incidentally I understand he is already a [employer] member and I suggested despite his knee problems he could consider the stationary bike or pool exercises three times a week to improve his conditioning.   Ayeisha Lindenberger, Virgie Dad, MD  10/06/17 3:31 PM Medical Oncology and Hematology [facility] [address] [place], [place] [address] Tel. [phone]    Fax. [phone]  This document serves as a record of services personally performed by Lurline Del, MD. It was created on his behalf by Sheron Nightingale, a trained medical scribe. The creation of this record is based on the scribe's personal observations and the provider's statements to them.   I have reviewed the above documentation for accuracy and completeness, and I agree with the above.

## 2017-10-06 ENCOUNTER — Inpatient Hospital Stay: Payer: Medicare HMO | Admitting: Oncology

## 2017-10-06 ENCOUNTER — Telehealth: Payer: Self-pay | Admitting: Oncology

## 2017-10-06 VITALS — BP 129/58 | HR 71 | Temp 97.5°F | Resp 20 | Ht 72.0 in | Wt 288.2 lb

## 2017-10-06 DIAGNOSIS — E08311 Diabetes mellitus due to underlying condition with unspecified diabetic retinopathy with macular edema: Secondary | ICD-10-CM

## 2017-10-06 DIAGNOSIS — Z79899 Other long term (current) drug therapy: Secondary | ICD-10-CM

## 2017-10-06 DIAGNOSIS — I5032 Chronic diastolic (congestive) heart failure: Secondary | ICD-10-CM

## 2017-10-06 DIAGNOSIS — E669 Obesity, unspecified: Secondary | ICD-10-CM | POA: Diagnosis not present

## 2017-10-06 DIAGNOSIS — D473 Essential (hemorrhagic) thrombocythemia: Secondary | ICD-10-CM

## 2017-10-06 DIAGNOSIS — I480 Paroxysmal atrial fibrillation: Secondary | ICD-10-CM | POA: Diagnosis not present

## 2017-10-06 DIAGNOSIS — I495 Sick sinus syndrome: Secondary | ICD-10-CM

## 2017-10-06 DIAGNOSIS — G4730 Sleep apnea, unspecified: Secondary | ICD-10-CM | POA: Diagnosis not present

## 2017-10-06 DIAGNOSIS — I63512 Cerebral infarction due to unspecified occlusion or stenosis of left middle cerebral artery: Secondary | ICD-10-CM

## 2017-10-06 DIAGNOSIS — E1151 Type 2 diabetes mellitus with diabetic peripheral angiopathy without gangrene: Secondary | ICD-10-CM | POA: Diagnosis not present

## 2017-10-06 DIAGNOSIS — R69 Illness, unspecified: Secondary | ICD-10-CM | POA: Diagnosis not present

## 2017-10-06 DIAGNOSIS — E785 Hyperlipidemia, unspecified: Secondary | ICD-10-CM | POA: Diagnosis not present

## 2017-10-06 DIAGNOSIS — K219 Gastro-esophageal reflux disease without esophagitis: Secondary | ICD-10-CM | POA: Diagnosis not present

## 2017-10-06 DIAGNOSIS — E119 Type 2 diabetes mellitus without complications: Secondary | ICD-10-CM

## 2017-10-06 DIAGNOSIS — E0842 Diabetes mellitus due to underlying condition with diabetic polyneuropathy: Secondary | ICD-10-CM

## 2017-10-06 DIAGNOSIS — I10 Essential (primary) hypertension: Secondary | ICD-10-CM | POA: Diagnosis not present

## 2017-10-06 DIAGNOSIS — E11319 Type 2 diabetes mellitus with unspecified diabetic retinopathy without macular edema: Secondary | ICD-10-CM | POA: Insufficient documentation

## 2017-10-06 DIAGNOSIS — Z794 Long term (current) use of insulin: Secondary | ICD-10-CM

## 2017-10-06 DIAGNOSIS — D689 Coagulation defect, unspecified: Secondary | ICD-10-CM

## 2017-10-06 DIAGNOSIS — E1142 Type 2 diabetes mellitus with diabetic polyneuropathy: Secondary | ICD-10-CM | POA: Insufficient documentation

## 2017-10-06 MED ORDER — HYDROXYUREA 500 MG PO CAPS: 1000.0000 mg | ORAL_CAPSULE | Freq: Every day | ORAL | 6 refills | Status: DC

## 2017-10-06 NOTE — Telephone Encounter (Signed)
Gave patient AVs and calendar of upcoming February through May appointments.

## 2017-10-11 ENCOUNTER — Ambulatory Visit: Payer: Medicare HMO | Admitting: Neurology

## 2017-10-11 ENCOUNTER — Encounter: Payer: Self-pay | Admitting: Neurology

## 2017-10-11 VITALS — BP 130/74 | HR 67 | Ht 72.0 in | Wt 288.4 lb

## 2017-10-11 DIAGNOSIS — I6310 Cerebral infarction due to embolism of unspecified precerebral artery: Secondary | ICD-10-CM | POA: Diagnosis not present

## 2017-10-11 NOTE — Patient Instructions (Signed)
I had a long d/w patient about his recent embolic stroke, atrial fibrillation, essential thrombocytosisrisk for recurrent stroke/TIAs, personally independently reviewed imaging studies and stroke evaluation results and answered questions.Continue Eliquis (apixaban) daily  for secondary stroke prevention and maintain strict control of hypertension with blood pressure goal below 130/90, diabetes with hemoglobin A1c goal below 6.5% and lipids with LDL cholesterol goal below 70 mg/dL. I also advised the patient to eat a healthy diet with plenty of whole grains, cereals, fruits and vegetables, exercise regularly and maintain ideal body weight ibuprofen also encouraged the patient to be compliant with using his CPAP every night. The patient was advised to follow-up with his hematologist Dr. Burman Nieves night for continuing treatment for his essential thrombocytosis. The patient was advised to postpone his knee surgery for at least 6 months since his stroke to be out of the maximum risk period of  for recurrent strokes.Followup in the future with my nurse practitioner Janett Billow in 6 months or call earlier if necessary   Stroke Prevention Some medical conditions and behaviors are associated with a higher chance of having a stroke. You can help prevent a stroke by making nutrition, lifestyle, and other changes, including managing any medical conditions you may have. What nutrition changes can be made?  Eat healthy foods. You can do this by: ? Choosing foods high in fiber, such as fresh fruits and vegetables and whole grains. ? Eating at least 5 or more servings of fruits and vegetables a day. Try to fill half of your plate at each meal with fruits and vegetables. ? Choosing lean protein foods, such as lean cuts of meat, poultry without skin, fish, tofu, beans, and nuts. ? Eating low-fat dairy products. ? Avoiding foods that are high in salt (sodium). This can help lower blood pressure. ? Avoiding foods that have  saturated fat, trans fat, and cholesterol. This can help prevent high cholesterol. ? Avoiding processed and premade foods.  Follow your health care provider's specific guidelines for losing weight, controlling high blood pressure (hypertension), lowering high cholesterol, and managing diabetes. These may include: ? Reducing your daily calorie intake. ? Limiting your daily sodium intake to 1,500 milligrams (mg). ? Using only healthy fats for cooking, such as olive oil, canola oil, or sunflower oil. ? Counting your daily carbohydrate intake. What lifestyle changes can be made?  Maintain a healthy weight. Talk to your health care provider about your ideal weight.  Get at least 30 minutes of moderate physical activity at least 5 days a week. Moderate activity includes brisk walking, biking, and swimming.  Do not use any products that contain nicotine or tobacco, such as cigarettes and e-cigarettes. If you need help quitting, ask your health care provider. It may also be helpful to avoid exposure to secondhand smoke.  Limit alcohol intake to no more than 1 drink a day for nonpregnant women and 2 drinks a day for men. One drink equals 12 oz of beer, 5 oz of wine, or 1 oz of hard liquor.  Stop any illegal drug use.  Avoid taking birth control pills. Talk to your health care provider about the risks of taking birth control pills if: ? You are over 78 years old. ? You smoke. ? You get migraines. ? You have ever had a blood clot. What other changes can be made?  Manage your cholesterol levels. ? Eating a healthy diet is important for preventing high cholesterol. If cholesterol cannot be managed through diet alone, you may also need to  take medicines. ? Take any prescribed medicines to control your cholesterol as told by your health care provider.  Manage your diabetes. ? Eating a healthy diet and exercising regularly are important parts of managing your blood sugar. If your blood sugar cannot  be managed through diet and exercise, you may need to take medicines. ? Take any prescribed medicines to control your diabetes as told by your health care provider.  Control your hypertension. ? To reduce your risk of stroke, try to keep your blood pressure below 130/80. ? Eating a healthy diet and exercising regularly are an important part of controlling your blood pressure. If your blood pressure cannot be managed through diet and exercise, you may need to take medicines. ? Take any prescribed medicines to control hypertension as told by your health care provider. ? Ask your health care provider if you should monitor your blood pressure at home. ? Have your blood pressure checked every year, even if your blood pressure is normal. Blood pressure increases with age and some medical conditions.  Get evaluated for sleep disorders (sleep apnea). Talk to your health care provider about getting a sleep evaluation if you snore a lot or have excessive sleepiness.  Take over-the-counter and prescription medicines only as told by your health care provider. Aspirin or blood thinners (antiplatelets or anticoagulants) may be recommended to reduce your risk of forming blood clots that can lead to stroke.  Make sure that any other medical conditions you have, such as atrial fibrillation or atherosclerosis, are managed. What are the warning signs of a stroke? The warning signs of a stroke can be easily remembered as BEFAST.  B is for balance. Signs include: ? Dizziness. ? Loss of balance or coordination. ? Sudden trouble walking.  E is for eyes. Signs include: ? A sudden change in vision. ? Trouble seeing.  F is for face. Signs include: ? Sudden weakness or numbness of the face. ? The face or eyelid drooping to one side.  A is for arms. Signs include: ? Sudden weakness or numbness of the arm, usually on one side of the body.  S is for speech. Signs include: ? Trouble speaking  (aphasia). ? Trouble understanding.  T is for time. ? These symptoms may represent a serious problem that is an emergency. Do not wait to see if the symptoms will go away. Get medical help right away. Call your local emergency services (911 in the [place] Do not drive yourself to the hospital.  Other signs of stroke may include: ? A sudden, severe headache with no known cause. ? Nausea or vomiting. ? Seizure.  Where to find more information: For more information, visit:  American Stroke Association: www.strokeassociation.org  National Stroke Association: www.stroke.org  Summary  You can prevent a stroke by eating healthy, exercising, not smoking, limiting alcohol intake, and managing any medical conditions you may have.  Do not use any products that contain nicotine or tobacco, such as cigarettes and e-cigarettes. If you need help quitting, ask your health care provider. It may also be helpful to avoid exposure to secondhand smoke.  Remember BEFAST for warning signs of stroke. Get help right away if you or a loved one has any of these signs. This information is not intended to replace advice given to you by your health care provider. Make sure you discuss any questions you have with your health care provider. Document Released: 10/06/2004 Document Revised: 10/04/2016 Document Reviewed: 10/04/2016 Elsevier Interactive Patient Education  [employer].

## 2017-10-11 NOTE — Progress Notes (Signed)
[name] [address] [place]. [place] [address] [phone]       OFFICE FOLLOW-UP NOTE  [name] Date of Birth:  [birth year] Medical Record Number:  [id number]   HPI: [name] is seen today for first office follow-up visit for hospital admission for stroke in November 2019. History is obtained from the patient and review of electronic medical records. I personally reviewed imaging films.[name] a 77 y.o.malewith a history of afib on Xarelto who has been having difficulty speaking since awakening this morning. He states that it seems worse at times, butthese episodes of worsening are only for a few seconds. He has also had two episodes of right sided numbness lasting a few seconds as well. 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.LKW:11/27 prior to bed.tpa given?: no,out of window.CT scan of the head showed acute small left frontal MCA territory nonhemorrhagic infarct and moderate changes of small vessel disease. 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 CT angiogram of the brain showed moderate stenosis of left M2 and proximal right posterior cerebral arteries.MRI scan of the brain confirmed a small foci of acute infarcts in the posterior left MCA territory involving posterior frontal and posterior parietal lobes likely emboli. Patient had known history of atrial fibrillation and was on Xarelto and yet had breakthrough infarcts.hemoglobin A1c was elevated at 7.8. Patient was changed from Xarelto to eliquis for second stroke prevention.patient had elevated platelet count of 881,000 which was up from a year ago from 623,000. He is referred to hematologist Dr. 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 Bone marrow biopsy was discussed but not done. Patient has been started on hydroxyurea. Patient states that he  still has some intermittent numbness and tingling in his right hand but it is getting better it occurs once or twice a week and last only 30 seconds. This is often triggered by having his neck or arms in strange positions like stretching backwards. He is tolerating eliquis well without bleeding or bruising. He states his blood pressure is well controlled and today it is 130/79. He continues to have trouble with his sugars which remained high and last hemoglobin A1c was 8.1. Patient has chronic right knee pain and actually had scheduled right knee surgery with Dr. Juliette Alcide in February that now is willing to wait for 6 months since his stroke. He does also have sleep apnea but he has not been compliant with CPAP as he cannot tolerate it. He does have chronic paresthesias in his feet from diabetic retinopathy which is stable   ROS:   14 system review of systems is positive for  Tingling in both feet, imbalance, knee pain, gait difficulties and all other systems negative  PMH:  Past Medical History:  Diagnosis Date  . Anxiety   . Arthritis   . Cancer (HCC)    skin - basil cell  . Depression   . Diabetes mellitus without complication ([place])   . Dysrhythmia    a-fib  . GERD (gastroesophageal reflux disease)   . Hyperlipidemia   . Hypertension   . Neuropathy   . Obesity   . Paroxysmal atrial fibrillation (HCC)   . Peripheral vascular disease ([place])    diabetic neuropathy in both feet  . Sleep apnea    uses C-pap machine  . Stroke [facility])     Social History:  Social History   Socioeconomic History  . Marital status: Married    Spouse name: Not on file  . Number of children: Not on file  . Years of education: Not on file  . Highest education level: Not on file  Social Needs  . Financial resource strain: Not on file  . Food insecurity - worry: Not on file  . Food insecurity - inability: Not on file  . Transportation needs - medical: Not on file  . Transportation needs - non-medical: Not on  file  Occupational History  . Not on file  Tobacco Use  . Smoking status: Former Smoker    Types: Cigars    Last attempt to quit: 08/02/1992    Years since quitting: 25.2  . Smokeless tobacco: Never Used  Substance and Sexual Activity  . Alcohol use: Yes    Alcohol/week: 0.0 oz    Comment: 1 beer a night.   . Drug use: No  . Sexual activity: Not on file  Other Topics Concern  . Not on file  Social History Narrative  . Not on file    Medications:   Current Outpatient Medications on File Prior to Visit  Medication Sig Dispense Refill  . ALPRAZolam (XANAX) 0.5 MG tablet Take 0.5 mg by mouth 2 (two) times daily.     [name] Kitchen apixaban (ELIQUIS) 5 MG TABS tablet Take 1 tablet (5 mg total) by mouth 2 (two) times daily. 180 tablet 2  . atorvastatin (LIPITOR) 80 MG tablet Take 1 tablet (80 mg total) by mouth daily at 6 PM. 30 tablet 0  . benazepril (LOTENSIN) 40 MG tablet Take 1 tablet (40 mg total) by mouth daily.    . Cholecalciferol (VITAMIN D3) 50000 units CAPS Take 1 capsule by mouth 2 (two) times a week.    . furosemide (LASIX) 20 MG tablet Take 20 mg by mouth daily.     [name] Kitchen gabapentin (NEURONTIN) 300 MG capsule Take 300 mg by mouth at bedtime.     . hydroxyurea (HYDREA) 500 MG capsule Take 2 capsules (1,000 mg total) by mouth daily. May take with food to minimize GI side effects. 60 capsule 6  . Multiple Vitamins-Minerals (MULTIVITAMIN ADULT PO) Take 1 tablet by mouth daily.    [name] Kitchen omeprazole (PRILOSEC) 20 MG capsule Take 20 mg by mouth daily.    [name] Kitchen venlafaxine XR (EFFEXOR-XR) 75 MG 24 hr capsule Take 75 mg by mouth daily.    . insulin NPH-regular Human (NOVOLIN 70/30) (70-30) 100 UNIT/ML injection Inject 44 Units into the skin 2 (two) times daily with a meal.    . metFORMIN (GLUCOPHAGE) 500 MG tablet Take 500 mg by mouth 2 (two) times daily with a meal.      No current facility-administered medications on file prior to visit.     Allergies:  No Known Allergies  Physical Exam General:  well developed, well nourished, seated, in no evident distress Head: head normocephalic and atraumatic.  Neck: supple with no carotid or supraclavicular bruits Cardiovascular: regular rate and rhythm, no murmurs Musculoskeletal: no deformity Skin:  no rash/petichiae Vascular:  Normal pulses all extremities Vitals:   10/11/17 1314  BP: 130/74  Pulse: 67   Neurologic Exam Mental Status: Awake and fully alert. Oriented to place and time. Recent and remote memory intact. Attention span, concentration and fund of knowledge appropriate. Mood and affect appropriate.  Cranial Nerves: Fundoscopic exam reveals sharp disc margins. Pupils equal, briskly reactive to light. Extraocular movements full without nystagmus. Visual fields  full to confrontation. Hearing intact. Facial sensation intact. Face, tongue, palate moves normally and symmetrically.  Motor: Normal bulk and tone. Normal strength in all tested extremity muscles.mild weakness of ankle dorsiflexors Sensory.: intact to touch ,pinprick .position and vibratory sensation.  Coordination: Rapid alternating movements normal in all extremities. Finger-to-nose and heel-to-shin performed accurately bilaterally. Gait and Station: Arises from chair with  difficulty. Stance is leaning to one side to favor the right kneel. Gait demonstrates favoring right knee.   Reflexes: 1+ and symmetric except ankle jerks are depressed. Toes downgoing.   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 He also has essential thrombocytosis which could have contributed. Vascular risk factors of diabetes, hyperlipidemia, obesity, sleep apnoea,atrial fibrillation and essential thrombocytosis .multifactorial gait difficulties from underlying mild diabetic peripheral neuropathy and right knee osteoarthritis   PLAN: I had a long d/w patient about his recent  embolic stroke, atrial fibrillation, essential thrombocytosisrisk for recurrent stroke/TIAs, personally independently reviewed imaging studies and stroke evaluation results and answered questions.Continue Eliquis (apixaban) daily  for secondary stroke prevention and maintain strict control of hypertension with blood pressure goal below 130/90, diabetes with hemoglobin A1c goal below 6.5% and lipids with LDL cholesterol goal below 70 mg/dL. I also advised the patient to eat a healthy diet with plenty of whole grains, cereals, fruits and vegetables, exercise regularly and maintain ideal body weight ibuprofen also encouraged the patient to be compliant with using his CPAP every night. The patient was advised to follow-up with his hematologist Dr. Burman Nieves night for continuing treatment for his essential thrombocytosis. The patient was advised to postpone his knee surgery for at least 6 months since his stroke to be out of the maximum risk period of  for recurrent strokes.Followup in the future with my nurse practitioner Janett Billow in 6 months or call earlier if necessary Greater than 50% of time during this 25 minute visit was spent on counseling,explanation of diagnosis embolic strokes, atrial fibrillation, essential thrombocytosis planning of further management, discussion with patient and family and coordination of care Charles Contras, MD  [facility] [address] [place], [place] [address]  Phone [phone] Fax [phone] Note: This document was prepared with digital dictation and possible smart phrase technology. Any transcriptional errors that result from this process are unintentional

## 2017-10-12 NOTE — Telephone Encounter (Signed)
Patient was seen by Dr. Leonie Man on 10/11/3017 for stroke follow up from the hospital. The patient was told by Dr.Sethi he was not cleared to have deep cleaning on 10/31/2017. Dr. Leonie Man wants pt to wait 6 months from 08/10/2017. PT will have to send form again after 01/08/2018. Pt needs to be stroke free till 01/08/2018. Form fax twice to  Dr .Felton Clinton office at [phone] and it was receive.

## 2017-10-16 ENCOUNTER — Inpatient Hospital Stay: Payer: Medicare HMO | Attending: Oncology

## 2017-10-16 ENCOUNTER — Encounter: Payer: Medicare HMO | Admitting: Oncology

## 2017-10-16 DIAGNOSIS — I480 Paroxysmal atrial fibrillation: Secondary | ICD-10-CM

## 2017-10-16 DIAGNOSIS — D473 Essential (hemorrhagic) thrombocythemia: Secondary | ICD-10-CM | POA: Diagnosis not present

## 2017-10-16 DIAGNOSIS — E119 Type 2 diabetes mellitus without complications: Secondary | ICD-10-CM

## 2017-10-16 DIAGNOSIS — E0842 Diabetes mellitus due to underlying condition with diabetic polyneuropathy: Secondary | ICD-10-CM

## 2017-10-16 DIAGNOSIS — I5032 Chronic diastolic (congestive) heart failure: Secondary | ICD-10-CM

## 2017-10-16 DIAGNOSIS — D75839 Thrombocytosis, unspecified: Secondary | ICD-10-CM

## 2017-10-16 DIAGNOSIS — I63512 Cerebral infarction due to unspecified occlusion or stenosis of left middle cerebral artery: Secondary | ICD-10-CM

## 2017-10-16 DIAGNOSIS — I495 Sick sinus syndrome: Secondary | ICD-10-CM

## 2017-10-16 DIAGNOSIS — Z794 Long term (current) use of insulin: Secondary | ICD-10-CM

## 2017-10-16 DIAGNOSIS — E08311 Diabetes mellitus due to underlying condition with unspecified diabetic retinopathy with macular edema: Secondary | ICD-10-CM

## 2017-10-16 DIAGNOSIS — D689 Coagulation defect, unspecified: Secondary | ICD-10-CM

## 2017-10-16 LAB — CBC WITH DIFFERENTIAL/PLATELET
BASOS ABS: 0.2 10*3/uL — AB (ref 0.0–0.1)
Basophils Relative: 2 %
EOS PCT: 3 %
Eosinophils Absolute: 0.2 10*3/uL (ref 0.0–0.5)
HCT: 41.7 % (ref 38.4–49.9)
Hemoglobin: 13.7 g/dL (ref 13.0–17.1)
LYMPHS PCT: 20 %
Lymphs Abs: 1.7 10*3/uL (ref 0.9–3.3)
MCH: 28.5 pg (ref 27.2–33.4)
MCHC: 32.9 g/dL (ref 32.0–36.0)
MCV: 86.7 fL (ref 79.3–98.0)
MONO ABS: 0.7 10*3/uL (ref 0.1–0.9)
MONOS PCT: 8 %
Neutro Abs: 5.8 10*3/uL (ref 1.5–6.5)
Neutrophils Relative %: 67 %
PLATELETS: 831 10*3/uL — AB (ref 140–400)
RBC: 4.81 MIL/uL (ref 4.20–5.82)
RDW: 17 % — ABNORMAL HIGH (ref 11.0–14.6)
WBC: 8.5 10*3/uL (ref 4.0–10.3)

## 2017-10-16 LAB — COMPREHENSIVE METABOLIC PANEL
ALT: 29 U/L (ref 0–55)
ANION GAP: 9 (ref 3–11)
AST: 21 U/L (ref 5–34)
Albumin: 3.5 g/dL (ref 3.5–5.0)
Alkaline Phosphatase: 120 U/L (ref 40–150)
BUN: 17 mg/dL (ref 7–26)
CHLORIDE: 103 mmol/L (ref 98–109)
CO2: 25 mmol/L (ref 22–29)
Calcium: 8.8 mg/dL (ref 8.4–10.4)
Creatinine, Ser: 0.98 mg/dL (ref 0.70–1.30)
Glucose, Bld: 307 mg/dL — ABNORMAL HIGH (ref 70–140)
POTASSIUM: 4.8 mmol/L (ref 3.5–5.1)
Sodium: 137 mmol/L (ref 136–145)
TOTAL PROTEIN: 6.5 g/dL (ref 6.4–8.3)
Total Bilirubin: 0.8 mg/dL (ref 0.2–1.2)

## 2017-10-30 ENCOUNTER — Other Ambulatory Visit: Payer: Self-pay | Admitting: Oncology

## 2017-10-30 ENCOUNTER — Inpatient Hospital Stay: Payer: Medicare HMO

## 2017-10-30 DIAGNOSIS — D473 Essential (hemorrhagic) thrombocythemia: Secondary | ICD-10-CM

## 2017-10-30 DIAGNOSIS — I495 Sick sinus syndrome: Secondary | ICD-10-CM

## 2017-10-30 DIAGNOSIS — E119 Type 2 diabetes mellitus without complications: Secondary | ICD-10-CM

## 2017-10-30 DIAGNOSIS — E0842 Diabetes mellitus due to underlying condition with diabetic polyneuropathy: Secondary | ICD-10-CM

## 2017-10-30 DIAGNOSIS — D75839 Thrombocytosis, unspecified: Secondary | ICD-10-CM

## 2017-10-30 DIAGNOSIS — E08311 Diabetes mellitus due to underlying condition with unspecified diabetic retinopathy with macular edema: Secondary | ICD-10-CM

## 2017-10-30 DIAGNOSIS — Z794 Long term (current) use of insulin: Secondary | ICD-10-CM

## 2017-10-30 DIAGNOSIS — I63512 Cerebral infarction due to unspecified occlusion or stenosis of left middle cerebral artery: Secondary | ICD-10-CM

## 2017-10-30 DIAGNOSIS — I5032 Chronic diastolic (congestive) heart failure: Secondary | ICD-10-CM

## 2017-10-30 DIAGNOSIS — I480 Paroxysmal atrial fibrillation: Secondary | ICD-10-CM

## 2017-10-30 DIAGNOSIS — D689 Coagulation defect, unspecified: Secondary | ICD-10-CM

## 2017-10-30 LAB — CBC WITH DIFFERENTIAL/PLATELET
BASOS ABS: 0.1 10*3/uL (ref 0.0–0.1)
Basophils Relative: 2 %
EOS ABS: 0.1 10*3/uL (ref 0.0–0.5)
EOS PCT: 2 %
HCT: 39 % (ref 38.4–49.9)
Hemoglobin: 12.9 g/dL — ABNORMAL LOW (ref 13.0–17.1)
LYMPHS ABS: 1.4 10*3/uL (ref 0.9–3.3)
Lymphocytes Relative: 20 %
MCH: 28.6 pg (ref 27.2–33.4)
MCHC: 33.1 g/dL (ref 32.0–36.0)
MCV: 86.5 fL (ref 79.3–98.0)
MONO ABS: 0.5 10*3/uL (ref 0.1–0.9)
Monocytes Relative: 7 %
Neutro Abs: 4.9 10*3/uL (ref 1.5–6.5)
Neutrophils Relative %: 69 %
PLATELETS: 716 10*3/uL — AB (ref 140–400)
RBC: 4.51 MIL/uL (ref 4.20–5.82)
RDW: 17.6 % — AB (ref 11.0–14.6)
WBC: 7.1 10*3/uL (ref 4.0–10.3)

## 2017-10-30 LAB — COMPREHENSIVE METABOLIC PANEL
ALT: 31 U/L (ref 0–55)
AST: 22 U/L (ref 5–34)
Albumin: 3.5 g/dL (ref 3.5–5.0)
Alkaline Phosphatase: 98 U/L (ref 40–150)
Anion gap: 6 (ref 3–11)
BUN: 16 mg/dL (ref 7–26)
CO2: 28 mmol/L (ref 22–29)
CREATININE: 1.02 mg/dL (ref 0.70–1.30)
Calcium: 9.4 mg/dL (ref 8.4–10.4)
Chloride: 104 mmol/L (ref 98–109)
Glucose, Bld: 228 mg/dL — ABNORMAL HIGH (ref 70–140)
POTASSIUM: 4.6 mmol/L (ref 3.5–5.1)
SODIUM: 138 mmol/L (ref 136–145)
Total Bilirubin: 1 mg/dL (ref 0.2–1.2)
Total Protein: 6.4 g/dL (ref 6.4–8.3)

## 2017-10-30 NOTE — Progress Notes (Signed)
I called [name] with his labs results which are very favorable.  We will continue to keep an eye on his count and his next level would be 11/13/2017

## 2017-11-06 ENCOUNTER — Inpatient Hospital Stay: Admit: 2017-11-06 | Payer: Medicare Other | Admitting: Orthopedic Surgery

## 2017-11-06 SURGERY — ARTHROPLASTY, KNEE, TOTAL
Anesthesia: Choice | Site: Knee | Laterality: Right

## 2017-11-13 ENCOUNTER — Inpatient Hospital Stay: Payer: Medicare HMO | Attending: Oncology

## 2017-11-13 DIAGNOSIS — I480 Paroxysmal atrial fibrillation: Secondary | ICD-10-CM | POA: Diagnosis not present

## 2017-11-13 DIAGNOSIS — I63512 Cerebral infarction due to unspecified occlusion or stenosis of left middle cerebral artery: Secondary | ICD-10-CM | POA: Diagnosis not present

## 2017-11-13 DIAGNOSIS — Z794 Long term (current) use of insulin: Secondary | ICD-10-CM | POA: Diagnosis not present

## 2017-11-13 DIAGNOSIS — D689 Coagulation defect, unspecified: Secondary | ICD-10-CM | POA: Insufficient documentation

## 2017-11-13 DIAGNOSIS — E08311 Diabetes mellitus due to underlying condition with unspecified diabetic retinopathy with macular edema: Secondary | ICD-10-CM | POA: Insufficient documentation

## 2017-11-13 DIAGNOSIS — I5032 Chronic diastolic (congestive) heart failure: Secondary | ICD-10-CM

## 2017-11-13 DIAGNOSIS — I495 Sick sinus syndrome: Secondary | ICD-10-CM | POA: Insufficient documentation

## 2017-11-13 DIAGNOSIS — E119 Type 2 diabetes mellitus without complications: Secondary | ICD-10-CM

## 2017-11-13 DIAGNOSIS — E0842 Diabetes mellitus due to underlying condition with diabetic polyneuropathy: Secondary | ICD-10-CM

## 2017-11-13 DIAGNOSIS — D473 Essential (hemorrhagic) thrombocythemia: Secondary | ICD-10-CM | POA: Insufficient documentation

## 2017-11-13 DIAGNOSIS — D75839 Thrombocytosis, unspecified: Secondary | ICD-10-CM

## 2017-11-13 LAB — COMPREHENSIVE METABOLIC PANEL
ALT: 32 U/L (ref 0–55)
AST: 18 U/L (ref 5–34)
Albumin: 3.6 g/dL (ref 3.5–5.0)
Alkaline Phosphatase: 105 U/L (ref 40–150)
Anion gap: 8 (ref 3–11)
BUN: 12 mg/dL (ref 7–26)
CHLORIDE: 103 mmol/L (ref 98–109)
CO2: 27 mmol/L (ref 22–29)
Calcium: 9.4 mg/dL (ref 8.4–10.4)
Creatinine, Ser: 0.98 mg/dL (ref 0.70–1.30)
Glucose, Bld: 290 mg/dL — ABNORMAL HIGH (ref 70–140)
POTASSIUM: 4.9 mmol/L (ref 3.5–5.1)
SODIUM: 138 mmol/L (ref 136–145)
Total Bilirubin: 1 mg/dL (ref 0.2–1.2)
Total Protein: 6.7 g/dL (ref 6.4–8.3)

## 2017-11-13 LAB — CBC WITH DIFFERENTIAL/PLATELET
BASOS ABS: 0.1 10*3/uL (ref 0.0–0.1)
Basophils Relative: 2 %
EOS PCT: 2 %
Eosinophils Absolute: 0.1 10*3/uL (ref 0.0–0.5)
HCT: 40.7 % (ref 38.4–49.9)
Hemoglobin: 13.5 g/dL (ref 13.0–17.1)
LYMPHS ABS: 1.5 10*3/uL (ref 0.9–3.3)
LYMPHS PCT: 18 %
MCH: 29.3 pg (ref 27.2–33.4)
MCHC: 33.2 g/dL (ref 32.0–36.0)
MCV: 88.5 fL (ref 79.3–98.0)
Monocytes Absolute: 0.6 10*3/uL (ref 0.1–0.9)
Monocytes Relative: 8 %
Neutro Abs: 5.7 10*3/uL (ref 1.5–6.5)
Neutrophils Relative %: 70 %
PLATELETS: 636 10*3/uL — AB (ref 140–400)
RBC: 4.6 MIL/uL (ref 4.20–5.82)
RDW: 19.1 % — ABNORMAL HIGH (ref 11.0–14.6)
WBC: 8 10*3/uL (ref 4.0–10.3)

## 2017-11-14 ENCOUNTER — Telehealth: Payer: Self-pay

## 2017-11-14 ENCOUNTER — Encounter: Payer: Self-pay | Admitting: Oncology

## 2017-11-14 NOTE — Telephone Encounter (Signed)
Called pt to provide recent platelet count results per Dr Jana Hakim, "platelets are coming down nicely, continue Hydrea as before"  No answer, left VM with our contact info for pt to return our call.

## 2017-11-15 ENCOUNTER — Other Ambulatory Visit: Payer: Self-pay | Admitting: *Deleted

## 2017-11-28 DIAGNOSIS — Z7984 Long term (current) use of oral hypoglycemic drugs: Secondary | ICD-10-CM | POA: Diagnosis not present

## 2017-11-28 DIAGNOSIS — E113293 Type 2 diabetes mellitus with mild nonproliferative diabetic retinopathy without macular edema, bilateral: Secondary | ICD-10-CM | POA: Diagnosis not present

## 2017-11-28 DIAGNOSIS — H35373 Puckering of macula, bilateral: Secondary | ICD-10-CM | POA: Diagnosis not present

## 2017-11-28 LAB — BCR/ABL

## 2017-11-29 ENCOUNTER — Other Ambulatory Visit: Payer: Self-pay | Admitting: Oncology

## 2017-12-13 ENCOUNTER — Inpatient Hospital Stay: Payer: Medicare HMO | Attending: Oncology

## 2017-12-13 DIAGNOSIS — I63512 Cerebral infarction due to unspecified occlusion or stenosis of left middle cerebral artery: Secondary | ICD-10-CM

## 2017-12-13 DIAGNOSIS — I495 Sick sinus syndrome: Secondary | ICD-10-CM

## 2017-12-13 DIAGNOSIS — D75839 Thrombocytosis, unspecified: Secondary | ICD-10-CM

## 2017-12-13 DIAGNOSIS — E08311 Diabetes mellitus due to underlying condition with unspecified diabetic retinopathy with macular edema: Secondary | ICD-10-CM

## 2017-12-13 DIAGNOSIS — D689 Coagulation defect, unspecified: Secondary | ICD-10-CM

## 2017-12-13 DIAGNOSIS — I5032 Chronic diastolic (congestive) heart failure: Secondary | ICD-10-CM

## 2017-12-13 DIAGNOSIS — D473 Essential (hemorrhagic) thrombocythemia: Secondary | ICD-10-CM | POA: Diagnosis not present

## 2017-12-13 DIAGNOSIS — I480 Paroxysmal atrial fibrillation: Secondary | ICD-10-CM

## 2017-12-13 DIAGNOSIS — Z794 Long term (current) use of insulin: Secondary | ICD-10-CM

## 2017-12-13 DIAGNOSIS — E119 Type 2 diabetes mellitus without complications: Secondary | ICD-10-CM

## 2017-12-13 DIAGNOSIS — E0842 Diabetes mellitus due to underlying condition with diabetic polyneuropathy: Secondary | ICD-10-CM

## 2017-12-13 LAB — COMPREHENSIVE METABOLIC PANEL
ALK PHOS: 104 U/L (ref 40–150)
ALT: 28 U/L (ref 0–55)
AST: 22 U/L (ref 5–34)
Albumin: 3.4 g/dL — ABNORMAL LOW (ref 3.5–5.0)
Anion gap: 6 (ref 3–11)
BILIRUBIN TOTAL: 0.7 mg/dL (ref 0.2–1.2)
BUN: 13 mg/dL (ref 7–26)
CALCIUM: 9.1 mg/dL (ref 8.4–10.4)
CO2: 27 mmol/L (ref 22–29)
CREATININE: 0.92 mg/dL (ref 0.70–1.30)
Chloride: 104 mmol/L (ref 98–109)
Glucose, Bld: 258 mg/dL — ABNORMAL HIGH (ref 70–140)
Potassium: 4.5 mmol/L (ref 3.5–5.1)
SODIUM: 137 mmol/L (ref 136–145)
Total Protein: 6.3 g/dL — ABNORMAL LOW (ref 6.4–8.3)

## 2017-12-13 LAB — CBC WITH DIFFERENTIAL/PLATELET
BASOS PCT: 2 %
Basophils Absolute: 0.1 10*3/uL (ref 0.0–0.1)
EOS ABS: 0.1 10*3/uL (ref 0.0–0.5)
Eosinophils Relative: 1 %
HCT: 36.5 % — ABNORMAL LOW (ref 38.4–49.9)
Hemoglobin: 12.1 g/dL — ABNORMAL LOW (ref 13.0–17.1)
Lymphocytes Relative: 14 %
Lymphs Abs: 0.9 10*3/uL (ref 0.9–3.3)
MCH: 30.5 pg (ref 27.2–33.4)
MCHC: 33.2 g/dL (ref 32.0–36.0)
MCV: 91.9 fL (ref 79.3–98.0)
Monocytes Absolute: 0.8 10*3/uL (ref 0.1–0.9)
Monocytes Relative: 13 %
NEUTROS PCT: 70 %
Neutro Abs: 4.4 10*3/uL (ref 1.5–6.5)
Platelets: 520 10*3/uL — ABNORMAL HIGH (ref 140–400)
RBC: 3.97 MIL/uL — ABNORMAL LOW (ref 4.20–5.82)
RDW: 21.3 % — AB (ref 11.0–14.6)
WBC: 6.4 10*3/uL (ref 4.0–10.3)

## 2017-12-14 DIAGNOSIS — I480 Paroxysmal atrial fibrillation: Secondary | ICD-10-CM | POA: Diagnosis not present

## 2017-12-14 DIAGNOSIS — I10 Essential (primary) hypertension: Secondary | ICD-10-CM | POA: Diagnosis not present

## 2017-12-14 DIAGNOSIS — I5032 Chronic diastolic (congestive) heart failure: Secondary | ICD-10-CM | POA: Diagnosis not present

## 2017-12-14 DIAGNOSIS — R05 Cough: Secondary | ICD-10-CM | POA: Diagnosis not present

## 2017-12-14 DIAGNOSIS — E1140 Type 2 diabetes mellitus with diabetic neuropathy, unspecified: Secondary | ICD-10-CM | POA: Diagnosis not present

## 2017-12-14 DIAGNOSIS — J309 Allergic rhinitis, unspecified: Secondary | ICD-10-CM | POA: Diagnosis not present

## 2017-12-28 ENCOUNTER — Other Ambulatory Visit: Payer: Self-pay | Admitting: Dermatology

## 2017-12-28 DIAGNOSIS — L820 Inflamed seborrheic keratosis: Secondary | ICD-10-CM | POA: Diagnosis not present

## 2017-12-28 DIAGNOSIS — D485 Neoplasm of uncertain behavior of skin: Secondary | ICD-10-CM | POA: Diagnosis not present

## 2018-01-03 ENCOUNTER — Encounter: Payer: Self-pay | Admitting: Interventional Cardiology

## 2018-01-03 ENCOUNTER — Ambulatory Visit: Payer: Medicare HMO | Admitting: Interventional Cardiology

## 2018-01-03 VITALS — BP 122/68 | HR 72 | Ht 72.0 in | Wt 281.0 lb

## 2018-01-03 DIAGNOSIS — I495 Sick sinus syndrome: Secondary | ICD-10-CM

## 2018-01-03 DIAGNOSIS — G4733 Obstructive sleep apnea (adult) (pediatric): Secondary | ICD-10-CM

## 2018-01-03 DIAGNOSIS — E785 Hyperlipidemia, unspecified: Secondary | ICD-10-CM | POA: Diagnosis not present

## 2018-01-03 DIAGNOSIS — Z7901 Long term (current) use of anticoagulants: Secondary | ICD-10-CM | POA: Diagnosis not present

## 2018-01-03 DIAGNOSIS — I480 Paroxysmal atrial fibrillation: Secondary | ICD-10-CM

## 2018-01-03 DIAGNOSIS — I5032 Chronic diastolic (congestive) heart failure: Secondary | ICD-10-CM

## 2018-01-03 NOTE — Progress Notes (Signed)
Cardiology Office Note    Date:  01/03/2018   ID:  [name], [name] 01/24/1941, MRN [id number]  PCP:  Charles Bowen, MD  Cardiologist: Charles Grooms, MD   Chief Complaint  Patient presents with  . Follow-up    NO COMPLAINTS  . Atrial Fibrillation    History of Present Illness:  [name] is a 77 y.o. male who presents for asymptomatic atrial fibrillation, obstructive sleep apnea, hypertension, diabetes, PVD, and hyperlipidemia.   He is doing well.  He refuses to wear CPAP and compression stockings.  He denies palpitations, chest pain, shortness of breath, and syncope.  He denies headaches.  He has not had bleeding on anticoagulation therapy.   Past Medical History:  Diagnosis Date  . Anxiety   . Arthritis   . Cancer (HCC)    skin - basil cell  . Depression   . Diabetes mellitus without complication ([place])   . Dysrhythmia    a-fib  . GERD (gastroesophageal reflux disease)   . Hyperlipidemia   . Hypertension   . Neuropathy   . Obesity   . Paroxysmal atrial fibrillation (HCC)   . Peripheral vascular disease ([place])    diabetic neuropathy in both feet  . Sleep apnea    uses C-pap machine  . Stroke [facility])     Past Surgical History:  Procedure Laterality Date  . APPENDECTOMY  1962  . BACK SURGERY  00-02-12   x3  . BASAL CELL CARCINOMA EXCISION  93/06/10  . COLONOSCOPY    . KNEE ARTHROSCOPY  005/01/02  . 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 ALPRAZolam (XANAX) 0.5 MG tablet Take 0.5 mg by mouth 2 (two) times daily.     [name] Kitchen apixaban (ELIQUIS) 5 MG TABS tablet Take 1 tablet (5 mg total) by mouth 2 (two) times daily. 180 tablet 2  . atorvastatin (LIPITOR) 80 MG tablet Take 1 tablet (80 mg total) by mouth daily at 6 PM. 30 tablet  0  . benazepril (LOTENSIN) 40 MG tablet Take 1 tablet (40 mg total) by mouth daily.    . ergocalciferol (VITAMIN D2) 50000 units capsule Take 50,000 Units by mouth 2 (two) times a week.    . furosemide (LASIX) 20 MG tablet Take 20 mg by mouth daily.     [name] Kitchen gabapentin (NEURONTIN) 300 MG capsule Take 300 mg by mouth at bedtime.     . hydroxyurea (HYDREA) 500 MG capsule Take 2 capsules (1,000 mg total) by mouth daily. May take with food to minimize GI side effects. 60 capsule 6  . insulin NPH-regular Human (NOVOLIN 70/30) (70-30) 100 UNIT/ML injection Inject 44 Units into the skin 2 (two) times daily with a meal.    . metFORMIN (GLUCOPHAGE) 500 MG tablet Take 250 mg by mouth 2 (two) times daily with a meal.    . Multiple Vitamins-Minerals (MULTIVITAMIN ADULT PO) Take 1 tablet by mouth daily.    [name] Kitchen omeprazole (PRILOSEC) 20 MG capsule Take 20 mg by mouth daily.    [name] Kitchen venlafaxine XR (EFFEXOR-XR) 75 MG 24 hr capsule Take 75 mg by mouth daily.    . Cholecalciferol (VITAMIN D3) 50000 units CAPS Take 1 capsule by mouth 2 (two) times a week.    . metFORMIN (GLUCOPHAGE) 500 MG  tablet Take 500 mg by mouth 2 (two) times daily with a meal.      No facility-administered medications prior to visit.      Allergies:   Patient has no known allergies.   Social History   Socioeconomic History  . Marital status: Married    Spouse name: Not on file  . Number of children: Not on file  . Years of education: Not on file  . Highest education level: Not on file  Occupational History  . Not on file  Social Needs  . Financial resource strain: Not on file  . Food insecurity:    Worry: Not on file    Inability: Not on file  . Transportation needs:    Medical: Not on file    Non-medical: Not on file  Tobacco Use  . Smoking status: Former Smoker    Types: Cigars    Last attempt to quit: 08/02/1992    Years since quitting: 25.4  . Smokeless tobacco: Never Used  Substance and Sexual Activity  . Alcohol use: Yes      Alcohol/week: 0.0 oz    Comment: 1 beer a night.   . Drug use: No  . Sexual activity: Not on file  Lifestyle  . Physical activity:    Days per week: Not on file    Minutes per session: Not on file  . Stress: Not on file  Relationships  . Social connections:    Talks on phone: Not on file    Gets together: Not on file    Attends religious service: Not on file    Active member of club or organization: Not on file    Attends meetings of clubs or organizations: Not on file    Relationship status: Not on file  Other Topics Concern  . Not on file  Social History Narrative  . Not on file     Family History:  The patient's family history includes CVA in his father; Cancer in his mother; Diabetes Mellitus II in his mother; Heart failure in his father; Hypertension in his mother and sister.   ROS:   Please see the history of present illness.    Continues to have difficulty with balance. All other systems reviewed and are negative.   PHYSICAL EXAM:   VS:  BP 122/68   Pulse 72   Ht 6' (1.829 m)   Wt 281 lb (127.5 kg)   BMI 38.11 kg/m    GEN: Well nourished, well developed, in no acute distress  HEENT: normal  Neck: no JVD, carotid bruits, or masses Cardiac: RRR; no murmurs, rubs, or gallops,no edema  Respiratory:  clear to auscultation bilaterally, normal work of breathing GI: soft, nontender, nondistended, + BS MS: no deformity or atrophy  Skin: warm and dry, no rash Neuro:  Alert and Oriented x 3, Strength and sensation are intact Psych: euthymic mood, full affect  Wt Readings from Last 3 Encounters:  01/03/18 281 lb (127.5 kg)  10/11/17 288 lb 6.4 oz (130.8 kg)  10/06/17 288 lb 3.2 oz (130.7 kg)      Studies/Labs Reviewed:   EKG:  EKG not repeated  Recent Labs: 12/13/2017: ALT 28; BUN 13; Creatinine, Ser 0.92; Hemoglobin 12.1; Platelets 520; Potassium 4.5; Sodium 137   Lipid Panel No results found for: CHOL, TRIG, HDL, CHOLHDL, VLDL, LDLCALC,  LDLDIRECT  Additional studies/ records that were reviewed today include:  No new data    ASSESSMENT:    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 Paroxysmal atrial fibrillation ([place])   3. Obstructive sleep apnea   4. Long term current use of anticoagulant therapy   5. Hyperlipidemia, unspecified hyperlipidemia type   6. Chronic diastolic CHF (congestive heart failure) ([place])   7. Chronic anticoagulation      PLAN:  In order of problems listed above:  1. Currently stable without syncope or recurrent symptomatic atrial fibrillation. 2. Will need to continue long-term anticoagulation therapy as he is status post CVA. 3. Encourage CPAP compliance. 4. No bleeding on anticoagulation therapy. 5. LDL target less than 70. 6. No evidence of pulmonary congestion.  Does have bilateral lower extremity edema left greater than right.  Suspect this is venous insufficiency in absence of neck vein distention.  No change in current therapy. 7. Continue Eliquis  Clinical follow-up in 6-9 months.  Call if any cardiopulmonary complaints.  Compression stockings as much as possible.  CPAP is recommended.  He refuses both pression stockings and CPAP.    Medication Adjustments/Labs and Tests Ordered: Current medicines are reviewed at length with the patient today.  Concerns regarding medicines are outlined above.  Medication changes, Labs and Tests ordered today are listed in the Patient Instructions below. There are no Patient Instructions on file for this visit.   Signed, Charles Grooms, MD  01/03/2018 3:44 PM    [address], [place], [place]  [address] Phone: [phone]; Fax: [phone]

## 2018-01-03 NOTE — Patient Instructions (Signed)
Medication Instructions:  Your physician recommends that you continue on your current medications as directed. Please refer to the Current Medication list given to you today.   Labwork: None  Testing/Procedures: None  Follow-Up: Your physician wants you to follow-up in: 6-9 months with Dr. Smith. You will receive a reminder letter in the mail two months in advance. If you don't receive a letter, please call our office to schedule the follow-up appointment.   Any Other Special Instructions Will Be Listed Below (If Applicable).     If you need a refill on your cardiac medications before your next appointment, please call your pharmacy.   

## 2018-01-10 ENCOUNTER — Other Ambulatory Visit: Payer: Medicare HMO

## 2018-01-24 DIAGNOSIS — D473 Essential (hemorrhagic) thrombocythemia: Secondary | ICD-10-CM | POA: Diagnosis not present

## 2018-01-24 DIAGNOSIS — D126 Benign neoplasm of colon, unspecified: Secondary | ICD-10-CM | POA: Diagnosis not present

## 2018-01-24 DIAGNOSIS — E7849 Other hyperlipidemia: Secondary | ICD-10-CM | POA: Diagnosis not present

## 2018-01-24 DIAGNOSIS — N401 Enlarged prostate with lower urinary tract symptoms: Secondary | ICD-10-CM | POA: Diagnosis not present

## 2018-01-24 DIAGNOSIS — E1140 Type 2 diabetes mellitus with diabetic neuropathy, unspecified: Secondary | ICD-10-CM | POA: Diagnosis not present

## 2018-01-24 DIAGNOSIS — Z6839 Body mass index (BMI) 39.0-39.9, adult: Secondary | ICD-10-CM | POA: Diagnosis not present

## 2018-01-24 DIAGNOSIS — E1142 Type 2 diabetes mellitus with diabetic polyneuropathy: Secondary | ICD-10-CM | POA: Diagnosis not present

## 2018-01-24 DIAGNOSIS — E11319 Type 2 diabetes mellitus with unspecified diabetic retinopathy without macular edema: Secondary | ICD-10-CM | POA: Diagnosis not present

## 2018-01-24 DIAGNOSIS — I10 Essential (primary) hypertension: Secondary | ICD-10-CM | POA: Diagnosis not present

## 2018-01-24 DIAGNOSIS — Z8673 Personal history of transient ischemic attack (TIA), and cerebral infarction without residual deficits: Secondary | ICD-10-CM | POA: Diagnosis not present

## 2018-01-25 NOTE — Progress Notes (Signed)
[place]  [phone] [phone]     ID: TOREN TUCHOLSKI DOB: 09/27/1940  MR#: [id number]  HER#:[id number]  Patient Care Team: [name], MD as PCP - General (Endocrinology) Belva Crome, MD as PCP - Cardiology (Cardiology) Stephano Arrants, Virgie Dad, MD as Consulting Physician (Oncology) Irene Shipper, MD as Consulting Physician (Gastroenterology) Lavonna Monarch, MD as Consulting Physician (Dermatology) Roel Cluck, MD as Referring Physician (Ophthalmology) Garvin Fila, MD as Consulting Physician (Neurology) Margit Banda OTHER MD:  CHIEF COMPLAINT: Essential thrombocytosis  CURRENT TREATMENT:  hydroxyurea   HISTORY OF CURRENT ILLNESS: From the original intake note:  Dr. Forde Dandy obtained lab work on [name] 09/25/2017 showing a platelet count of 1,035,000.  He referred him at that point for further evaluation and sent [place] his prior lab work.  The earliest platelet count I have is from 03/18/2013.  It was normal then at 263,000.  On 07/12/2015 the platelet count was 559,000.  On 08/10/2016 the platelet count was 725,000.  On 08/09/2017 the platelet count was 881,000  Prior to today's visit I se upt [name] for some additional lab work.  This was performed 09/29/2017.  On that date his white cell count was 10.4, with the absolute neutrophil count 8.0.  Hemoglobin was 14.0 with an MCV of 86.7.  The platelet count was 895,000  The labs obtained on that date included a ferritin of 103, iron saturation of 76%, C-reactive protein less than 0.8, LDH 209 and BCR/ABL not detected.  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  The patient's subsequent history is as detailed below.  INTERVAL HISTORY: [name] returns today for a follow-up and treatment of his essential thrombocytosis.   He continues on Hydrea, which he started  around 08/12/2017.  He has been on a steady dose of 1 g daily.  He tolerates it well and denies any side effects. He thinks he pays about $40 a month his medication.  REVIEW OF SYSTEMS: Daran is doing well overall. However, he adds his A1C is not were it should be. He reports being a member of the [employer], but he has not gone much lately. He has issues with his knees which prohibit him from exercising more. He had his left knee replaced a couple of years ago and will be getting his right knee replaced in the future. He denies unusual headaches, visual changes, nausea, vomiting, or dizziness. There has been no unusual cough, phlegm production, or pleurisy. This been no change in bowel or bladder habits. He denies unexplained fatigue or unexplained weight loss, bleeding, rash, or fever. A detailed review of systems was otherwise noncontributory.    PAST MEDICAL HISTORY: Past Medical History:  Diagnosis Date  . Anxiety   . Arthritis   . Cancer (HCC)    skin - basil cell  . Depression   . Diabetes mellitus without complication ([place])   . Dysrhythmia    a-fib  . GERD (gastroesophageal reflux disease)   . Hyperlipidemia   . Hypertension   . Neuropathy   . Obesity   . Paroxysmal atrial fibrillation (HCC)   . Peripheral vascular disease ([place])    diabetic neuropathy in both feet  . Sleep apnea    uses C-pap machine  . Stroke [facility])     PAST SURGICAL HISTORY: Past Surgical History:  Procedure Laterality Date  . APPENDECTOMY  1962  . BACK SURGERY  00-02-12   x3  . BASAL CELL CARCINOMA EXCISION  93/06/10  . COLONOSCOPY    . KNEE ARTHROSCOPY  005/01/02  . 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 Cancer Mother   . Diabetes Mellitus II Mother   . Hypertension Mother   . Heart failure Father     . CVA Father   . Hypertension Sister   . Colon cancer Neg Hx   Patient's father died at age 69 shortly following a CABG.  The patient's mother died at age 50 from cancer but the patient does not know what type.  The patient has 3 brothers, 1 sister.  The sister died at age 37 following a stroke.  One brother had heart disease but is now doing well.  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  He is now retired.  He has 2 children from his first marriage, Dellis Filbert, who is a Music therapist and a med Designer, multimedia in [place], 52, and Rapids, who works for the [employer] in [place], [place].  The patient has 4 biological grandchildren.  His second wife, Webb Silversmith, has 2 children of her own, Hilliard Clark, 47, who is in [place] and works as a Higher education careers adviser at [employer], 44, who is a Management consultant in [place].  The patient is not a church attender    ADVANCED DIRECTIVES:    HEALTH MAINTENANCE: Social History   Tobacco Use  . Smoking status: Former Smoker    Types: Cigars    Last attempt to quit: 08/02/1992    Years since quitting: 25.5  . Smokeless tobacco: Never Used  Substance Use Topics  . Alcohol use: Yes    Alcohol/week: 0.0 oz    Comment: 1 beer a night.   . Drug use: No     Colonoscopy: 05/12/2015  Bone density:  PSA:   No Known Allergies  Current Outpatient Medications  Medication Sig Dispense Refill  . ALPRAZolam (XANAX) 0.5 MG tablet Take 0.5 mg by mouth 2 (two) times daily.     [name] Kitchen apixaban (ELIQUIS) 5 MG TABS tablet Take 1 tablet (5 mg total) by mouth 2 (two) times daily. 180 tablet 2  . atorvastatin (LIPITOR) 80 MG tablet Take 1 tablet (80 mg total) by mouth daily at 6 PM. 30 tablet 0  . benazepril (LOTENSIN) 40 MG tablet Take 1 tablet (40 mg total) by mouth daily.    . ergocalciferol (VITAMIN D2) 50000 units capsule Take 50,000 Units by mouth 2 (two) times a week.    . furosemide (LASIX) 20 MG tablet Take 20 mg by mouth  daily.     [name] Kitchen gabapentin (NEURONTIN) 300 MG capsule Take 300 mg by mouth at bedtime.     . hydroxyurea (HYDREA) 500 MG capsule Take 2 capsules (1,000 mg total) by mouth daily. May take with food to minimize GI side effects. 60 capsule 6  . insulin NPH-regular Human (NOVOLIN 70/30) (70-30) 100 UNIT/ML injection Inject 44 Units into the skin 2 (two) times daily with a meal.    . metFORMIN (GLUCOPHAGE) 500 MG tablet Take 250 mg by mouth 2 (two) times daily with a meal.    . Multiple Vitamins-Minerals (MULTIVITAMIN ADULT PO) Take 1 tablet by mouth daily.    [name] Kitchen omeprazole (  PRILOSEC) 20 MG capsule Take 20 mg by mouth daily.    [name] Kitchen venlafaxine XR (EFFEXOR-XR) 75 MG 24 hr capsule Take 75 mg by mouth daily.     No current facility-administered medications for this visit.     OBJECTIVE: Morbidly obese white man who appears stated age  34:   01/31/18 1434  BP: 126/60  Pulse: 65  Resp: 17  Temp: 98.8 F (37.1 C)  SpO2: 96%     Body mass index is 38.12 kg/m.   Wt Readings from Last 3 Encounters:  01/31/18 281 lb 1.6 oz (127.5 kg)  01/03/18 281 lb (127.5 kg)  10/11/17 288 lb 6.4 oz (130.8 kg)      ECOG FS:2 - Symptomatic, <50% confined to bed  Sclerae unicteric, EOMs intact Oropharynx clear and moist No cervical or supraclavicular adenopathy Lungs no rales or rhonchi Heart regular rate and rhythm Abd soft, nontender, positive bowel sounds MSK no focal spinal tenderness, walks with difficulty Neuro: nonfocal, well oriented, appropriate affect    LAB RESULTS:  CMP     Component Value Date/Time   NA 137 12/13/2017 1302   K 4.5 12/13/2017 1302   CL 104 12/13/2017 1302   CO2 27 12/13/2017 1302   GLUCOSE 258 (H) 12/13/2017 1302   BUN 13 12/13/2017 1302   CREATININE 0.92 12/13/2017 1302   CALCIUM 9.1 12/13/2017 1302   PROT 6.3 (L) 12/13/2017 1302   ALBUMIN 3.4 (L) 12/13/2017 1302   AST 22 12/13/2017 1302   ALT 28 12/13/2017 1302   ALKPHOS 104 12/13/2017 1302   BILITOT 0.7  12/13/2017 1302   GFRNONAA >60 12/13/2017 1302   GFRAA >60 12/13/2017 1302    No results found for: TOTALPROTELP, ALBUMINELP, A1GS, A2GS, BETS, BETA2SER, GAMS, MSPIKE, SPEI  No results found for: KPAFRELGTCHN, LAMBDASER, KAPLAMBRATIO  Lab Results  Component Value Date   WBC 7.1 01/31/2018   NEUTROABS 4.6 01/31/2018   HGB 13.3 01/31/2018   HCT 39.3 01/31/2018   MCV 99.3 (H) 01/31/2018   PLT 607 (H) 01/31/2018    _0 @  No results found for: LABCA2  No components found for: ITGPQD826  No results for input(s): INR in the last 168 hours.  No results found for: LABCA2  No results found for: EBR830  No results found for: NMM768  No results found for: GSU110  No results found for: CA2729  No components found for: HGQUANT  No results found for: CEA1 / No results found for: CEA1   No results found for: AFPTUMOR  No results found for: CHROMOGRNA  No results found for: PSA1  Appointment on 01/31/2018  Component Date Value Ref Range Status  . WBC 01/31/2018 7.1  4.0 - 10.3 K/uL Final  . RBC 01/31/2018 3.96* 4.20 - 5.82 MIL/uL Final  . Hemoglobin 01/31/2018 13.3  13.0 - 17.1 g/dL Final  . HCT 01/31/2018 39.3  38.4 - 49.9 % Final  . MCV 01/31/2018 99.3* 79.3 - 98.0 fL Final  . MCH 01/31/2018 33.7* 27.2 - 33.4 pg Final  . MCHC 01/31/2018 34.0  32.0 - 36.0 g/dL Final  . RDW 01/31/2018 19.4* 11.0 - 14.6 % Final  . Platelets 01/31/2018 607* 140 - 400 K/uL Final  . Neutrophils Relative % 01/31/2018 65  % Final  . Neutro Abs 01/31/2018 4.6  1.5 - 6.5 K/uL Final  . Lymphocytes Relative 01/31/2018 23  % Final  . Lymphs Abs 01/31/2018 1.7  0.9 - 3.3 K/uL Final  . Monocytes Relative 01/31/2018 8  %  Final  . Monocytes Absolute 01/31/2018 0.5  0.1 - 0.9 K/uL Final  . Eosinophils Relative 01/31/2018 2  % Final  . Eosinophils Absolute 01/31/2018 0.1  0.0 - 0.5 K/uL Final  . Basophils Relative 01/31/2018 2  % Final  . Basophils Absolute 01/31/2018 0.1  0.0 - 0.1 K/uL  Final   Performed at [facility], [place] [address], [place], [place] [address]    (this displays the last labs from the last 3 days)  No results found for: TOTALPROTELP, ALBUMINELP, A1GS, A2GS, BETS, BETA2SER, GAMS, MSPIKE, SPEI (this displays SPEP labs)  No results found for: KPAFRELGTCHN, LAMBDASER, KAPLAMBRATIO (kappa/lambda light chains)  No results found for: HGBA, HGBA2QUANT, HGBFQUANT, HGBSQUAN (Hemoglobinopathy evaluation)   Lab Results  Component Value Date   LDH 209 09/29/2017    Lab Results  Component Value Date   IRON 166 (H) 09/29/2017   TIBC 219 09/29/2017   IRONPCTSAT 76 09/29/2017   (Iron and TIBC)  Lab Results  Component Value Date   FERRITIN 143 09/29/2017    Urinalysis    Component Value Date/Time   COLORURINE YELLOW 10/26/2015 [place] 10/26/2015 1013   LABSPEC 1.017 10/26/2015 1013   PHURINE 5.0 10/26/2015 1013   GLUCOSEU >1000 (A) 10/26/2015 1013   HGBUR NEGATIVE 10/26/2015 [place] 10/26/2015 [place] 10/26/2015 [place] 10/26/2015 1013   NITRITE NEGATIVE 10/26/2015 [place] 10/26/2015 1013     STUDIES: Prior CT scans reviewed  REVIEW OF BLOOD FILM: Blood film from 09/29/2017, there is no significant red cell abnormality, and particularly no poikilocytosis or nucleated red cells.  There are no platelet clumps and no giant platelets.  There is minimal left shift in the white cell series, with a few bands.    ELIGIBLE FOR AVAILABLE RESEARCH PROTOCOL: no  ASSESSMENT: 77 y.o. Octa Man with a platelet count in excess of 1 million 09/25/2017, a positive Jak2 V617F mutation, and normal iron studies, normal C-reactive protein, and negative BCR-ABL by PCR  (1) essential thrombocytosis: Started on Hydrea 1000 mg a day 10/06/2017  (2) bone marrow biopsy discussed, postponed  Right assist 3) 0.7 cm right upper lung nodule  incidentally noted on neck CT scans November 2018  PLAN: Nowell has been on 1 g of Hydrea daily since December.  This has brought his platelet count down from 1.2 million or so to the current 600,000.  There has been a slight increase as compared to the last month, but last months reading may be anomalous as the hemoglobin also was a little bit low.  I think the best strategy is to continue to follow monthly labs at the current dose of Hydrea.  Other options of course would be to increase the dose of Hydrea or to add anagrelide.  He prefers continuing as we have been doing since he is having 0 side effects from this treatment  He brought to my attention that he had been told during his November admission there was a lung nodule.  We reviewed his old scans and indeed there is a 0.7 cm right upper lobe nodule.  This requires follow-up and we discussed multiple options.  What we are going to do is obtain a repeat CT of the chest at this month.  If the nodule has disappeared or is unchanged I would repeat a chest CT in 1 year.  If it slightly larger we would repeated in  6 months.  If it is considerably larger of course we would have to have a different discussion  Otherwise she will return to see me in November.  He knows to call for any other issues that may develop before that visit.     Vada Yellen, Virgie Dad, MD  01/31/18 2:48 PM Medical Oncology and Hematology [facility] [address] [place], [place] [address] Tel. [phone]    Fax. [phone]  This document serves as a record of services personally performed by Chauncey Cruel, MD. It was created on his behalf by Margit Banda, a trained medical scribe. The creation of this record is based on the scribe's personal observations and the provider's statements to them.   I have reviewed the above documentation for accuracy and completeness, and I agree with the above.

## 2018-01-31 ENCOUNTER — Inpatient Hospital Stay: Payer: Medicare HMO | Attending: Oncology

## 2018-01-31 ENCOUNTER — Inpatient Hospital Stay (HOSPITAL_BASED_OUTPATIENT_CLINIC_OR_DEPARTMENT_OTHER): Payer: Medicare HMO | Admitting: Oncology

## 2018-01-31 VITALS — BP 126/60 | HR 65 | Temp 98.8°F | Resp 17 | Ht 72.0 in | Wt 281.1 lb

## 2018-01-31 DIAGNOSIS — E785 Hyperlipidemia, unspecified: Secondary | ICD-10-CM | POA: Diagnosis not present

## 2018-01-31 DIAGNOSIS — E08311 Diabetes mellitus due to underlying condition with unspecified diabetic retinopathy with macular edema: Secondary | ICD-10-CM

## 2018-01-31 DIAGNOSIS — D75839 Thrombocytosis, unspecified: Secondary | ICD-10-CM

## 2018-01-31 DIAGNOSIS — I480 Paroxysmal atrial fibrillation: Secondary | ICD-10-CM | POA: Diagnosis not present

## 2018-01-31 DIAGNOSIS — Z87891 Personal history of nicotine dependence: Secondary | ICD-10-CM | POA: Insufficient documentation

## 2018-01-31 DIAGNOSIS — I5032 Chronic diastolic (congestive) heart failure: Secondary | ICD-10-CM

## 2018-01-31 DIAGNOSIS — R911 Solitary pulmonary nodule: Secondary | ICD-10-CM | POA: Insufficient documentation

## 2018-01-31 DIAGNOSIS — K219 Gastro-esophageal reflux disease without esophagitis: Secondary | ICD-10-CM | POA: Diagnosis not present

## 2018-01-31 DIAGNOSIS — Z79899 Other long term (current) drug therapy: Secondary | ICD-10-CM

## 2018-01-31 DIAGNOSIS — R918 Other nonspecific abnormal finding of lung field: Secondary | ICD-10-CM

## 2018-01-31 DIAGNOSIS — F419 Anxiety disorder, unspecified: Secondary | ICD-10-CM | POA: Insufficient documentation

## 2018-01-31 DIAGNOSIS — I10 Essential (primary) hypertension: Secondary | ICD-10-CM | POA: Diagnosis not present

## 2018-01-31 DIAGNOSIS — E0842 Diabetes mellitus due to underlying condition with diabetic polyneuropathy: Secondary | ICD-10-CM

## 2018-01-31 DIAGNOSIS — D689 Coagulation defect, unspecified: Secondary | ICD-10-CM

## 2018-01-31 DIAGNOSIS — Z85828 Personal history of other malignant neoplasm of skin: Secondary | ICD-10-CM | POA: Insufficient documentation

## 2018-01-31 DIAGNOSIS — Z794 Long term (current) use of insulin: Secondary | ICD-10-CM | POA: Insufficient documentation

## 2018-01-31 DIAGNOSIS — F329 Major depressive disorder, single episode, unspecified: Secondary | ICD-10-CM | POA: Diagnosis not present

## 2018-01-31 DIAGNOSIS — I495 Sick sinus syndrome: Secondary | ICD-10-CM

## 2018-01-31 DIAGNOSIS — D473 Essential (hemorrhagic) thrombocythemia: Secondary | ICD-10-CM | POA: Diagnosis not present

## 2018-01-31 DIAGNOSIS — I63512 Cerebral infarction due to unspecified occlusion or stenosis of left middle cerebral artery: Secondary | ICD-10-CM

## 2018-01-31 DIAGNOSIS — G4730 Sleep apnea, unspecified: Secondary | ICD-10-CM | POA: Diagnosis not present

## 2018-01-31 DIAGNOSIS — R69 Illness, unspecified: Secondary | ICD-10-CM | POA: Diagnosis not present

## 2018-01-31 DIAGNOSIS — E119 Type 2 diabetes mellitus without complications: Secondary | ICD-10-CM

## 2018-01-31 LAB — COMPREHENSIVE METABOLIC PANEL
ALBUMIN: 3.6 g/dL (ref 3.5–5.0)
ALT: 31 U/L (ref 0–55)
AST: 22 U/L (ref 5–34)
Alkaline Phosphatase: 118 U/L (ref 40–150)
Anion gap: 7 (ref 3–11)
BUN: 12 mg/dL (ref 7–26)
CALCIUM: 9.2 mg/dL (ref 8.4–10.4)
CHLORIDE: 102 mmol/L (ref 98–109)
CO2: 28 mmol/L (ref 22–29)
Creatinine, Ser: 0.9 mg/dL (ref 0.70–1.30)
GFR calc Af Amer: >60 mL/min (ref >60)
GFR calc non Af Amer: >60 mL/min (ref >60)
GLUCOSE: 295 mg/dL — AB (ref 70–140)
Potassium: 4.4 mmol/L (ref 3.5–5.1)
SODIUM: 137 mmol/L (ref 136–145)
Total Bilirubin: 0.8 mg/dL (ref 0.2–1.2)
Total Protein: 6.7 g/dL (ref 6.4–8.3)

## 2018-01-31 LAB — CBC WITH DIFFERENTIAL/PLATELET
Basophils Absolute: 0.1 10*3/uL (ref 0.0–0.1)
Basophils Relative: 2 %
EOS ABS: 0.1 10*3/uL (ref 0.0–0.5)
Eosinophils Relative: 2 %
HCT: 39.3 % (ref 38.4–49.9)
HEMOGLOBIN: 13.3 g/dL (ref 13.0–17.1)
LYMPHS ABS: 1.7 10*3/uL (ref 0.9–3.3)
Lymphocytes Relative: 23 %
MCH: 33.7 pg — AB (ref 27.2–33.4)
MCHC: 34 g/dL (ref 32.0–36.0)
MCV: 99.3 fL — ABNORMAL HIGH (ref 79.3–98.0)
Monocytes Absolute: 0.5 10*3/uL (ref 0.1–0.9)
Monocytes Relative: 8 %
NEUTROS ABS: 4.6 10*3/uL (ref 1.5–6.5)
NEUTROS PCT: 65 %
Platelets: 607 10*3/uL — ABNORMAL HIGH (ref 140–400)
RBC: 3.96 MIL/uL — ABNORMAL LOW (ref 4.20–5.82)
RDW: 19.4 % — ABNORMAL HIGH (ref 11.0–14.6)
WBC: 7.1 10*3/uL (ref 4.0–10.3)

## 2018-01-31 MED ORDER — HYDROXYUREA 500 MG PO CAPS: 1000.0000 mg | ORAL_CAPSULE | Freq: Every day | ORAL | 12 refills | Status: DC

## 2018-02-12 ENCOUNTER — Other Ambulatory Visit: Payer: Self-pay | Admitting: Pharmacist

## 2018-02-12 MED ORDER — APIXABAN 5 MG PO TABS: 5.0000 mg | ORAL_TABLET | Freq: Two times a day (BID) | ORAL | 1 refills | Status: DC

## 2018-02-28 ENCOUNTER — Ambulatory Visit (HOSPITAL_COMMUNITY)
Admission: RE | Admit: 2018-02-28 | Discharge: 2018-02-28 | Disposition: A | Discharge status: Home / Self Care | Payer: Medicare HMO | Source: Ambulatory Visit | Attending: Oncology | Admitting: Oncology

## 2018-02-28 ENCOUNTER — Inpatient Hospital Stay: Payer: Medicare HMO | Attending: Oncology

## 2018-02-28 DIAGNOSIS — I495 Sick sinus syndrome: Secondary | ICD-10-CM

## 2018-02-28 DIAGNOSIS — I5032 Chronic diastolic (congestive) heart failure: Secondary | ICD-10-CM

## 2018-02-28 DIAGNOSIS — I700 Atherosclerosis of aorta: Secondary | ICD-10-CM | POA: Diagnosis not present

## 2018-02-28 DIAGNOSIS — D689 Coagulation defect, unspecified: Secondary | ICD-10-CM

## 2018-02-28 DIAGNOSIS — E0842 Diabetes mellitus due to underlying condition with diabetic polyneuropathy: Secondary | ICD-10-CM

## 2018-02-28 DIAGNOSIS — Z794 Long term (current) use of insulin: Secondary | ICD-10-CM

## 2018-02-28 DIAGNOSIS — E08311 Diabetes mellitus due to underlying condition with unspecified diabetic retinopathy with macular edema: Secondary | ICD-10-CM

## 2018-02-28 DIAGNOSIS — R911 Solitary pulmonary nodule: Secondary | ICD-10-CM | POA: Diagnosis not present

## 2018-02-28 DIAGNOSIS — R918 Other nonspecific abnormal finding of lung field: Secondary | ICD-10-CM

## 2018-02-28 DIAGNOSIS — I63512 Cerebral infarction due to unspecified occlusion or stenosis of left middle cerebral artery: Secondary | ICD-10-CM

## 2018-02-28 DIAGNOSIS — D473 Essential (hemorrhagic) thrombocythemia: Secondary | ICD-10-CM | POA: Insufficient documentation

## 2018-02-28 DIAGNOSIS — I480 Paroxysmal atrial fibrillation: Secondary | ICD-10-CM

## 2018-02-28 DIAGNOSIS — E119 Type 2 diabetes mellitus without complications: Secondary | ICD-10-CM

## 2018-02-28 DIAGNOSIS — I2510 Atherosclerotic heart disease of native coronary artery without angina pectoris: Secondary | ICD-10-CM | POA: Diagnosis not present

## 2018-02-28 DIAGNOSIS — D75839 Thrombocytosis, unspecified: Secondary | ICD-10-CM

## 2018-02-28 LAB — CBC WITH DIFFERENTIAL/PLATELET
BASOS ABS: 0.1 10*3/uL (ref 0.0–0.1)
BASOS PCT: 1 %
EOS ABS: 0.1 10*3/uL (ref 0.0–0.5)
EOS PCT: 2 %
HEMATOCRIT: 39 % (ref 38.4–49.9)
Hemoglobin: 13 g/dL (ref 13.0–17.1)
Lymphocytes Relative: 16 %
Lymphs Abs: 1.3 10*3/uL (ref 0.9–3.3)
MCH: 33.8 pg — ABNORMAL HIGH (ref 27.2–33.4)
MCHC: 33.3 g/dL (ref 32.0–36.0)
MCV: 101.3 fL — ABNORMAL HIGH (ref 79.3–98.0)
MONO ABS: 0.7 10*3/uL (ref 0.1–0.9)
MONOS PCT: 8 %
NEUTROS ABS: 5.9 10*3/uL (ref 1.5–6.5)
Neutrophils Relative %: 73 %
PLATELETS: 568 10*3/uL — AB (ref 140–400)
RBC: 3.85 MIL/uL — ABNORMAL LOW (ref 4.20–5.82)
RDW: 16 % — AB (ref 11.0–14.6)
WBC: 8.1 10*3/uL (ref 4.0–10.3)

## 2018-02-28 LAB — COMPREHENSIVE METABOLIC PANEL
ALBUMIN: 3.5 g/dL (ref 3.5–5.0)
ALT: 19 U/L (ref 0–55)
AST: 17 U/L (ref 5–34)
Alkaline Phosphatase: 118 U/L (ref 40–150)
Anion gap: 6 (ref 3–11)
BILIRUBIN TOTAL: 0.7 mg/dL (ref 0.2–1.2)
BUN: 15 mg/dL (ref 7–26)
CHLORIDE: 101 mmol/L (ref 98–109)
CO2: 29 mmol/L (ref 22–29)
Calcium: 9.5 mg/dL (ref 8.4–10.4)
Creatinine, Ser: 1.03 mg/dL (ref 0.70–1.30)
GFR calc Af Amer: >60 mL/min (ref >60)
GFR calc non Af Amer: >60 mL/min (ref >60)
GLUCOSE: 369 mg/dL — AB (ref 70–140)
POTASSIUM: 5 mmol/L (ref 3.5–5.1)
SODIUM: 136 mmol/L (ref 136–145)
TOTAL PROTEIN: 6.5 g/dL (ref 6.4–8.3)

## 2018-02-28 MED ORDER — IOHEXOL 300 MG/ML  SOLN
75.0000 mL | Freq: Once | INTRAMUSCULAR | Status: AC | PRN
  Administered 2018-02-28: 75 mL via INTRAVENOUS

## 2018-03-02 ENCOUNTER — Encounter: Payer: Self-pay | Admitting: Oncology

## 2018-03-07 ENCOUNTER — Telehealth: Payer: Self-pay | Admitting: *Deleted

## 2018-03-07 NOTE — Telephone Encounter (Signed)
This RN called pt's given number per his call for results of CT- obtained identified VM - message left to return call to this RN.

## 2018-03-26 DIAGNOSIS — R69 Illness, unspecified: Secondary | ICD-10-CM | POA: Diagnosis not present

## 2018-03-28 ENCOUNTER — Encounter: Payer: Self-pay | Admitting: Oncology

## 2018-03-28 ENCOUNTER — Inpatient Hospital Stay: Payer: Medicare HMO | Attending: Oncology

## 2018-03-28 DIAGNOSIS — D473 Essential (hemorrhagic) thrombocythemia: Secondary | ICD-10-CM | POA: Insufficient documentation

## 2018-03-28 DIAGNOSIS — E08311 Diabetes mellitus due to underlying condition with unspecified diabetic retinopathy with macular edema: Secondary | ICD-10-CM

## 2018-03-28 DIAGNOSIS — Z794 Long term (current) use of insulin: Secondary | ICD-10-CM

## 2018-03-28 DIAGNOSIS — I5032 Chronic diastolic (congestive) heart failure: Secondary | ICD-10-CM

## 2018-03-28 DIAGNOSIS — I495 Sick sinus syndrome: Secondary | ICD-10-CM

## 2018-03-28 DIAGNOSIS — E119 Type 2 diabetes mellitus without complications: Secondary | ICD-10-CM

## 2018-03-28 DIAGNOSIS — D689 Coagulation defect, unspecified: Secondary | ICD-10-CM

## 2018-03-28 DIAGNOSIS — I63512 Cerebral infarction due to unspecified occlusion or stenosis of left middle cerebral artery: Secondary | ICD-10-CM

## 2018-03-28 DIAGNOSIS — E0842 Diabetes mellitus due to underlying condition with diabetic polyneuropathy: Secondary | ICD-10-CM

## 2018-03-28 DIAGNOSIS — D75839 Thrombocytosis, unspecified: Secondary | ICD-10-CM

## 2018-03-28 DIAGNOSIS — I480 Paroxysmal atrial fibrillation: Secondary | ICD-10-CM

## 2018-03-28 LAB — COMPREHENSIVE METABOLIC PANEL WITH GFR
ALT: 29 U/L (ref 0–44)
AST: 18 U/L (ref 15–41)
Albumin: 3.5 g/dL (ref 3.5–5.0)
Alkaline Phosphatase: 129 U/L — ABNORMAL HIGH (ref 38–126)
Anion gap: 5 (ref 5–15)
BUN: 12 mg/dL (ref 8–23)
CO2: 28 mmol/L (ref 22–32)
Calcium: 9 mg/dL (ref 8.9–10.3)
Chloride: 100 mmol/L (ref 98–111)
Creatinine, Ser: 0.97 mg/dL (ref 0.61–1.24)
GFR calc Af Amer: >60 mL/min
GFR calc non Af Amer: >60 mL/min
Glucose, Bld: 354 mg/dL — ABNORMAL HIGH (ref 70–99)
Potassium: 4.9 mmol/L (ref 3.5–5.1)
Sodium: 133 mmol/L — ABNORMAL LOW (ref 135–145)
Total Bilirubin: 0.7 mg/dL (ref 0.3–1.2)
Total Protein: 6.4 g/dL — ABNORMAL LOW (ref 6.5–8.1)

## 2018-03-28 LAB — CBC WITH DIFFERENTIAL/PLATELET
BASOS ABS: 0.1 10*3/uL (ref 0.0–0.1)
BASOS PCT: 2 %
EOS PCT: 2 %
Eosinophils Absolute: 0.1 10*3/uL (ref 0.0–0.5)
HCT: 38.9 % (ref 38.4–49.9)
Hemoglobin: 13 g/dL (ref 13.0–17.1)
LYMPHS PCT: 19 %
Lymphs Abs: 1.4 10*3/uL (ref 0.9–3.3)
MCH: 34.2 pg — ABNORMAL HIGH (ref 27.2–33.4)
MCHC: 33.5 g/dL (ref 32.0–36.0)
MCV: 102.2 fL — ABNORMAL HIGH (ref 79.3–98.0)
MONO ABS: 0.6 10*3/uL (ref 0.1–0.9)
Monocytes Relative: 8 %
Neutro Abs: 5.1 10*3/uL (ref 1.5–6.5)
Neutrophils Relative %: 69 %
PLATELETS: 562 10*3/uL — AB (ref 140–400)
RBC: 3.81 MIL/uL — ABNORMAL LOW (ref 4.20–5.82)
RDW: 15 % — AB (ref 11.0–14.6)
WBC: 7.3 10*3/uL (ref 4.0–10.3)

## 2018-04-12 NOTE — Progress Notes (Signed)
[name] [address] [place]. [place] [address] [phone]       OFFICE FOLLOW-UP NOTE  [name] Date of Birth:  06/04/1941 Medical Record Number:  [id number]   Chief Complaint  Patient presents with  . Follow-up    RM 9, alone.  Saw Dr. Leonie Hall 10/11/17. [place] recently and bruised his ribs. This happened a couple weeks ago around 04/06/18. He also fell around May 2019 (hurt ribs also)   . CPAP    Pt reports he has sleep apnea but does not use CPAP machine.      HPI: [name] is seen today for office follow-up visit for stroke in November 2019. History is obtained from the patient and review of electronic medical records. I personally reviewed imaging films. [name] a 77 y.o.malewith a history of afib on Xarelto who has been having difficulty speaking since awakening this morning. He states that it seems worse at times, butthese episodes of worsening are only for a few seconds. He has also had two episodes of right sided numbness lasting a few seconds as well. 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.LKW:11/27 prior to bed.tpa given?: no,out of window.CT scan of the head showed acute small left frontal MCA territory nonhemorrhagic infarct and moderate changes of small vessel disease. 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 CT angiogram of the brain showed moderate stenosis of left M2 and proximal right posterior cerebral arteries.MRI scan of the brain confirmed a small foci of acute infarcts in the posterior left MCA territory involving posterior frontal and posterior parietal lobes likely emboli. Patient had known history of atrial fibrillation and was on Xarelto and yet had breakthrough infarcts.hemoglobin A1c was elevated at 7.8. Patient was changed from Xarelto to eliquis for second stroke prevention.patient had elevated platelet count of  881,000 which was up from a year ago from 623,000. 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 Bone marrow biopsy was discussed but not done.   10/11/17 visit PS: Patient has been started on hydroxyurea by oncology. Patient states that he still has some intermittent numbness and tingling in his right hand but it is getting better it occurs once or twice a week and last only 30 seconds. This is often triggered by having his neck or arms in strange positions like stretching backwards. He is tolerating eliquis well without bleeding or bruising. He states his blood pressure is well controlled and today it is 130/79. He continues to have trouble with his sugars which remained high and last hemoglobin A1c was 8.1. Patient has chronic right knee pain and actually had scheduled right knee surgery with Dr. Juliette Alcide in February that now is willing to wait for 6 months since his stroke. He does also have sleep apnea but he has not been compliant with CPAP as he cannot tolerate it. He does have chronic paresthesias in his feet from diabetic neuropathy which is stable   Interval history: Patient is being seen today for routine stroke follow-up appointment and overall is doing well from a stroke standpoint.  He continues to take Eliquis without bleeding or bruising for his atrial fibrillation and is managed by his cardiologist.  Continues to take Lipitor without side effects of myalgias.  Blood pressure today satisfactory 128/60.  He does have history of bilateral lower extremity diabetic neuropathy for which  he takes gabapentin 300 mg at night.  He continues to have neuropathy pain along with possibly worsening neuropathy with numbness and tingling going up into his calfs.  He has been compliant with gabapentin 300 mg at night but states he does not notice a difference with his neuropathy pain.  He has had a recent fall approximately 1 week ago where he was try to let his dog  outside and when he bent over to help him out the door, he fell forward and landed on his right side.  He denies hitting his head but does have mild residual right sided pain.  Previous fall before that was approximately 3 months ago where he states his wife put multiple throw rugs in the middle of the room after washing them and while he was ambulating in the middle the night he was unaware they were there and tripped over them and again fell on his right side but denies hitting his head.  He does state that he has balance issues at times due to his neuropathy.  He also still needs undergo right knee replacement which was postponed from February due to his stroke in November.  Patient is contemplating at this time whether to undergo surgery due to fear of having a recurrent stroke while off his Eliquis.  Patient states his knee limits him from prolonged activity and standing for any length of time along with increased difficulty ambulating.  Patient also has complaints of bilateral lower extremity swelling.  Patient did speak with cardiologist in regards to this and recommended compression stockings but per notes, he was refusing his treatment and no additional intervention needed.  Patient continues to be noncompliant with CPAP stating he is unable to tolerate machine despite risks of not having OSA treated.  Denies new or worsening stroke/TIA symptoms.     ROS:   14 system review of systems is positive for ankle swelling and numbness and all other systems negative  PMH:  Past Medical History:  Diagnosis Date  . Anxiety   . Arthritis   . Cancer (HCC)    skin - basil cell  . Depression   . Diabetes mellitus without complication ([place])   . Dysrhythmia    a-fib  . GERD (gastroesophageal reflux disease)   . Hyperlipidemia   . Hypertension   . Neuropathy   . Obesity   . Paroxysmal atrial fibrillation (HCC)   . Peripheral vascular disease ([place])    diabetic neuropathy in both feet  . Sleep apnea     uses C-pap machine  . Stroke [facility])     Social History:  Social History   Socioeconomic History  . Marital status: Married    Spouse name: Not on file  . Number of children: Not on file  . Years of education: Not on file  . Highest education level: Not on file  Occupational History  . Not on file  Social Needs  . Financial resource strain: Not on file  . Food insecurity:    Worry: Not on file    Inability: Not on file  . Transportation needs:    Medical: Not on file    Non-medical: Not on file  Tobacco Use  . Smoking status: Former Smoker    Types: Cigars    Last attempt to quit: 08/02/1992    Years since quitting: 25.7  . Smokeless tobacco: Never Used  Substance and Sexual Activity  . Alcohol use: Yes    Alcohol/week: 0.0 oz  Comment: 1 beer a night.   . Drug use: No  . Sexual activity: Not on file  Lifestyle  . Physical activity:    Days per week: Not on file    Minutes per session: Not on file  . Stress: Not on file  Relationships  . Social connections:    Talks on phone: Not on file    Gets together: Not on file    Attends religious service: Not on file    Active member of club or organization: Not on file    Attends meetings of clubs or organizations: Not on file    Relationship status: Not on file  . Intimate partner violence:    Fear of current or ex partner: Not on file    Emotionally abused: Not on file    Physically abused: Not on file    Forced sexual activity: Not on file  Other Topics Concern  . Not on file  Social History Narrative  . Not on file    Medications:   Current Outpatient Medications on File Prior to Visit  Medication Sig Dispense Refill  . ALPRAZolam (XANAX) 0.5 MG tablet Take 0.5 mg by mouth 2 (two) times daily.     [name] Kitchen apixaban (ELIQUIS) 5 MG TABS tablet Take 1 tablet (5 mg total) by mouth 2 (two) times daily. 180 tablet 1  . atorvastatin (LIPITOR) 80 MG tablet Take 1 tablet (80 mg total) by mouth daily at 6 PM. 30 tablet  0  . benazepril (LOTENSIN) 40 MG tablet Take 1 tablet (40 mg total) by mouth daily.    . ergocalciferol (VITAMIN D2) 50000 units capsule Take 50,000 Units by mouth 2 (two) times a week.    . furosemide (LASIX) 20 MG tablet Take 20 mg by mouth daily.     . hydroxyurea (HYDREA) 500 MG capsule Take 2 capsules (1,000 mg total) by mouth daily. May take with food to minimize GI side effects. 180 capsule 12  . insulin NPH-regular Human (NOVOLIN 70/30) (70-30) 100 UNIT/ML injection Inject 44 Units into the skin 2 (two) times daily with a meal.    . metFORMIN (GLUCOPHAGE) 500 MG tablet Take 250 mg by mouth 2 (two) times daily with a meal.    . Multiple Vitamins-Minerals (MULTIVITAMIN ADULT PO) Take 1 tablet by mouth daily.    [name] Kitchen omeprazole (PRILOSEC) 20 MG capsule Take 20 mg by mouth daily.    [name] Kitchen venlafaxine XR (EFFEXOR-XR) 75 MG 24 hr capsule Take 75 mg by mouth daily.     No current facility-administered medications on file prior to visit.     Allergies:  No Known Allergies  Physical Exam General: well developed, well nourished, pleasant elderly caucasian male, seated, in no evident distress Head: head normocephalic and atraumatic.  Neck: supple with no carotid or supraclavicular bruits Cardiovascular: regular rate and rhythm, no murmurs Musculoskeletal: no deformity Skin:  no rash/petichiae Vascular:  Normal pulses all extremities Vitals:   04/17/18 1232  BP: 128/60  Pulse: 66  SpO2: 98%   Neurologic Exam Mental Status: Awake and fully alert. Oriented to place and time. Recent and remote memory intact. Attention span, concentration and fund of knowledge appropriate. Mood and affect appropriate.  Cranial Nerves: Fundoscopic exam reveals sharp disc margins. Pupils equal, briskly reactive to light. Extraocular movements full without nystagmus. Visual fields full to confrontation. Hearing intact. Facial sensation intact. Face, tongue, palate moves normally and symmetrically.  Motor: Normal bulk  and tone. Normal strength in all tested extremity  muscles. Sensory.:  Decreased sensation to touch ,pinprick and vibratory sensation in bilateral lower extremities distally. Coordination: Rapid alternating movements normal in all extremities. Finger-to-nose and heel-to-shin performed accurately bilaterally. Gait and Station: Arises from chair with  difficulty. Stance is leaning to one side to favor the right knee. Gait demonstrates favoring right knee.   Reflexes: 1+ and symmetric except ankle jerks are depressed. Toes downgoing.     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 He also has essential thrombocytosis which could have contributed. Vascular risk factors of diabetes, hyperlipidemia, obesity, sleep apnoea,atrial fibrillation and essential thrombocytosis.  Patient returns today for routine stroke follow-up and overall is doing well from a stroke standpoint.   PLAN: -Continue Eliquis (apixaban) daily  and Lipitor for secondary stroke prevention -Due to continued neuropathic pain, increase gabapentin 300 mg to 600 mg (2 capsules) at night -F/u with PCP regarding your HLD, HTN and DM management -f/u with cardiologist as scheduled for atrial fibrillation and Eliquis management along with other chronic cardiac conditions -f/u with oncologist/hematologist as scheduled for essential thrombocytosis -Advised to contact cardiologist if continued concerns with lower extremity swelling but highly recommended use of compression stockings and elevation of legs due to prolonged sitting -Patient cleared for knee surgery from neurology standpoint as it is been over 6 months from his stroke and he has been stable.  Recommend to stop Eliquis for 2 to 3 days prior to procedure and restart immediately after.  There is a small but acceptable risk of having a stroke while off from Eliquis and patient verbalized  understanding.  Recommended to discuss further clearance with cardiologist and oncologist/hematologist. -continue to monitor BP at home -Advised to continue to stay active as tolerated and maintain a healthy diet -Maintain strict control of hypertension with blood pressure goal below 130/90, diabetes with hemoglobin A1c goal below 6.5% and cholesterol with LDL cholesterol (bad cholesterol) goal below 70 mg/dL. I also advised the patient to eat a healthy diet with plenty of whole grains, cereals, fruits and vegetables, exercise regularly and maintain ideal body weight.  Follow up in 6 months or call earlier if needed  Greater than 50% of time during this 25 minute visit was spent on counseling,explanation of diagnosis embolic strokes, atrial fibrillation, essential thrombocytosis planning of further management, discussion with patient and family and coordination of care  Venancio Poisson, AGNP-BC  [facility] [address] [place], [place] [address]  Phone [phone] Fax [phone] Note: This document was prepared with digital dictation and possible smart phrase technology. Any transcriptional errors that result from this process are unintentional.

## 2018-04-16 ENCOUNTER — Ambulatory Visit: Payer: Medicare HMO | Admitting: Adult Health

## 2018-04-17 ENCOUNTER — Ambulatory Visit: Payer: Medicare HMO | Admitting: Adult Health

## 2018-04-17 ENCOUNTER — Encounter: Payer: Self-pay | Admitting: Adult Health

## 2018-04-17 ENCOUNTER — Other Ambulatory Visit: Payer: Self-pay

## 2018-04-17 VITALS — BP 128/60 | HR 66 | Ht 72.0 in | Wt 284.0 lb

## 2018-04-17 DIAGNOSIS — Z794 Long term (current) use of insulin: Secondary | ICD-10-CM

## 2018-04-17 DIAGNOSIS — I5032 Chronic diastolic (congestive) heart failure: Secondary | ICD-10-CM

## 2018-04-17 DIAGNOSIS — E785 Hyperlipidemia, unspecified: Secondary | ICD-10-CM

## 2018-04-17 DIAGNOSIS — E119 Type 2 diabetes mellitus without complications: Secondary | ICD-10-CM

## 2018-04-17 DIAGNOSIS — I6310 Cerebral infarction due to embolism of unspecified precerebral artery: Secondary | ICD-10-CM | POA: Diagnosis not present

## 2018-04-17 DIAGNOSIS — G4733 Obstructive sleep apnea (adult) (pediatric): Secondary | ICD-10-CM | POA: Diagnosis not present

## 2018-04-17 DIAGNOSIS — I10 Essential (primary) hypertension: Secondary | ICD-10-CM | POA: Diagnosis not present

## 2018-04-17 DIAGNOSIS — I480 Paroxysmal atrial fibrillation: Secondary | ICD-10-CM | POA: Diagnosis not present

## 2018-04-17 MED ORDER — GABAPENTIN 300 MG PO CAPS: 600.0000 mg | ORAL_CAPSULE | Freq: Every day | ORAL | 3 refills | Status: DC

## 2018-04-17 NOTE — Patient Instructions (Addendum)
Continue Eliquis (apixaban) daily  and lipitor for secondary stroke prevention  Due to continued neuropathy, we will increase gabapentin from 300mg  to 600mg  (2 capsules) at night  Continue to follow up with PCP regarding cholesterol, diabetes and blood pressure management   continue to follow up with cardiologist regarding atrial fibrillation and eliquis management Call cardiologist if you continue to have increased swelling in lower extremities  Ensure your legs are elevated while sitting and wear compression stocking (these can even be diabetic compression stockings)  You are cleared for knee surgery at this time from a neurology standpoint as it has been over 6 months since your stroke.  You will stop Eliquis for 2-3 days prior to procedure and you will restart immediately after. There is a small but acceptable risk of having a stroke while off from Eliquis. Please discuss further clearance with cardiologist.   Continue to monitor blood pressure at home  Maintain strict control of hypertension with blood pressure goal below 130/90, diabetes with hemoglobin A1c goal below 6.5% and cholesterol with LDL cholesterol (bad cholesterol) goal below 70 mg/dL. I also advised the patient to eat a healthy diet with plenty of whole grains, cereals, fruits and vegetables, exercise regularly and maintain ideal body weight.  Followup in the future with me in 6 months or call earlier if needed       Thank you for coming to see [place] at [facility]. I hope we have been able to provide you high quality care today.  You may receive a patient satisfaction survey over the next few weeks. We would appreciate your feedback and comments so that we may continue to improve ourselves and the health of our patients.

## 2018-04-25 ENCOUNTER — Inpatient Hospital Stay: Payer: Medicare HMO | Attending: Oncology

## 2018-04-25 DIAGNOSIS — I480 Paroxysmal atrial fibrillation: Secondary | ICD-10-CM

## 2018-04-25 DIAGNOSIS — E119 Type 2 diabetes mellitus without complications: Secondary | ICD-10-CM

## 2018-04-25 DIAGNOSIS — E0842 Diabetes mellitus due to underlying condition with diabetic polyneuropathy: Secondary | ICD-10-CM

## 2018-04-25 DIAGNOSIS — I63512 Cerebral infarction due to unspecified occlusion or stenosis of left middle cerebral artery: Secondary | ICD-10-CM

## 2018-04-25 DIAGNOSIS — I5032 Chronic diastolic (congestive) heart failure: Secondary | ICD-10-CM

## 2018-04-25 DIAGNOSIS — D473 Essential (hemorrhagic) thrombocythemia: Secondary | ICD-10-CM

## 2018-04-25 DIAGNOSIS — Z794 Long term (current) use of insulin: Secondary | ICD-10-CM

## 2018-04-25 DIAGNOSIS — D75839 Thrombocytosis, unspecified: Secondary | ICD-10-CM

## 2018-04-25 DIAGNOSIS — D689 Coagulation defect, unspecified: Secondary | ICD-10-CM

## 2018-04-25 DIAGNOSIS — E08311 Diabetes mellitus due to underlying condition with unspecified diabetic retinopathy with macular edema: Secondary | ICD-10-CM

## 2018-04-25 DIAGNOSIS — I10 Essential (primary) hypertension: Secondary | ICD-10-CM | POA: Diagnosis not present

## 2018-04-25 DIAGNOSIS — Z6839 Body mass index (BMI) 39.0-39.9, adult: Secondary | ICD-10-CM | POA: Diagnosis not present

## 2018-04-25 DIAGNOSIS — I495 Sick sinus syndrome: Secondary | ICD-10-CM

## 2018-04-25 DIAGNOSIS — E1140 Type 2 diabetes mellitus with diabetic neuropathy, unspecified: Secondary | ICD-10-CM | POA: Diagnosis not present

## 2018-04-25 LAB — COMPREHENSIVE METABOLIC PANEL
ALBUMIN: 3.6 g/dL (ref 3.5–5.0)
ALT: 28 U/L (ref 0–44)
AST: 19 U/L (ref 15–41)
Alkaline Phosphatase: 129 U/L — ABNORMAL HIGH (ref 38–126)
Anion gap: 9 (ref 5–15)
BUN: 15 mg/dL (ref 8–23)
CALCIUM: 9 mg/dL (ref 8.9–10.3)
CHLORIDE: 103 mmol/L (ref 98–111)
CO2: 26 mmol/L (ref 22–32)
CREATININE: 1.04 mg/dL (ref 0.61–1.24)
GFR calc Af Amer: >60 mL/min (ref >60)
GFR calc non Af Amer: >60 mL/min (ref >60)
Glucose, Bld: 308 mg/dL — ABNORMAL HIGH (ref 70–99)
POTASSIUM: 5.7 mmol/L — AB (ref 3.5–5.1)
SODIUM: 138 mmol/L (ref 135–145)
TOTAL PROTEIN: 6.7 g/dL (ref 6.5–8.1)
Total Bilirubin: 0.9 mg/dL (ref 0.3–1.2)

## 2018-04-25 LAB — CBC WITH DIFFERENTIAL/PLATELET
BASOS ABS: 0.1 10*3/uL (ref 0.0–0.1)
BASOS PCT: 1 %
EOS PCT: 1 %
Eosinophils Absolute: 0.1 10*3/uL (ref 0.0–0.5)
HCT: 39.7 % (ref 38.4–49.9)
Hemoglobin: 13.4 g/dL (ref 13.0–17.1)
Lymphocytes Relative: 19 %
Lymphs Abs: 1.7 10*3/uL (ref 0.9–3.3)
MCH: 34.5 pg — ABNORMAL HIGH (ref 27.2–33.4)
MCHC: 33.8 g/dL (ref 32.0–36.0)
MCV: 102.3 fL — ABNORMAL HIGH (ref 79.3–98.0)
MONO ABS: 0.8 10*3/uL (ref 0.1–0.9)
Monocytes Relative: 8 %
NEUTROS ABS: 6.5 10*3/uL (ref 1.5–6.5)
Neutrophils Relative %: 71 %
PLATELETS: 588 10*3/uL — AB (ref 140–400)
RBC: 3.88 MIL/uL — ABNORMAL LOW (ref 4.20–5.82)
RDW: 15.6 % — AB (ref 11.0–14.6)
WBC: 9.2 10*3/uL (ref 4.0–10.3)

## 2018-04-26 NOTE — Progress Notes (Signed)
I agree with the above plan 

## 2018-05-23 ENCOUNTER — Encounter: Payer: Self-pay | Admitting: Oncology

## 2018-05-23 ENCOUNTER — Inpatient Hospital Stay: Payer: Medicare HMO | Attending: Oncology

## 2018-05-23 DIAGNOSIS — D75839 Thrombocytosis, unspecified: Secondary | ICD-10-CM

## 2018-05-23 DIAGNOSIS — E0842 Diabetes mellitus due to underlying condition with diabetic polyneuropathy: Secondary | ICD-10-CM

## 2018-05-23 DIAGNOSIS — I495 Sick sinus syndrome: Secondary | ICD-10-CM

## 2018-05-23 DIAGNOSIS — Z794 Long term (current) use of insulin: Secondary | ICD-10-CM

## 2018-05-23 DIAGNOSIS — I5032 Chronic diastolic (congestive) heart failure: Secondary | ICD-10-CM

## 2018-05-23 DIAGNOSIS — D473 Essential (hemorrhagic) thrombocythemia: Secondary | ICD-10-CM | POA: Diagnosis not present

## 2018-05-23 DIAGNOSIS — D689 Coagulation defect, unspecified: Secondary | ICD-10-CM

## 2018-05-23 DIAGNOSIS — E08311 Diabetes mellitus due to underlying condition with unspecified diabetic retinopathy with macular edema: Secondary | ICD-10-CM

## 2018-05-23 DIAGNOSIS — I63512 Cerebral infarction due to unspecified occlusion or stenosis of left middle cerebral artery: Secondary | ICD-10-CM

## 2018-05-23 DIAGNOSIS — E119 Type 2 diabetes mellitus without complications: Secondary | ICD-10-CM

## 2018-05-23 DIAGNOSIS — I480 Paroxysmal atrial fibrillation: Secondary | ICD-10-CM

## 2018-05-23 LAB — CBC WITH DIFFERENTIAL/PLATELET
BASOS ABS: 0.1 10*3/uL (ref 0.0–0.1)
Basophils Relative: 2 %
EOS ABS: 0.1 10*3/uL (ref 0.0–0.5)
EOS PCT: 2 %
HCT: 39.1 % (ref 38.4–49.9)
HEMOGLOBIN: 13.2 g/dL (ref 13.0–17.1)
LYMPHS ABS: 1.3 10*3/uL (ref 0.9–3.3)
LYMPHS PCT: 22 %
MCH: 34.4 pg — AB (ref 27.2–33.4)
MCHC: 33.8 g/dL (ref 32.0–36.0)
MCV: 102 fL — AB (ref 79.3–98.0)
Monocytes Absolute: 0.5 10*3/uL (ref 0.1–0.9)
Monocytes Relative: 8 %
Neutro Abs: 3.7 10*3/uL (ref 1.5–6.5)
Neutrophils Relative %: 66 %
PLATELETS: 612 10*3/uL — AB (ref 140–400)
RBC: 3.84 MIL/uL — ABNORMAL LOW (ref 4.20–5.82)
RDW: 15.4 % — ABNORMAL HIGH (ref 11.0–14.6)
WBC: 5.7 10*3/uL (ref 4.0–10.3)

## 2018-05-23 LAB — COMPREHENSIVE METABOLIC PANEL
ALK PHOS: 127 U/L — AB (ref 38–126)
ALT: 28 U/L (ref 0–44)
AST: 21 U/L (ref 15–41)
Albumin: 3.5 g/dL (ref 3.5–5.0)
Anion gap: 8 (ref 5–15)
BUN: 16 mg/dL (ref 8–23)
CALCIUM: 9.5 mg/dL (ref 8.9–10.3)
CHLORIDE: 104 mmol/L (ref 98–111)
CO2: 27 mmol/L (ref 22–32)
CREATININE: 0.93 mg/dL (ref 0.61–1.24)
GFR calc Af Amer: >60 mL/min (ref >60)
GFR calc non Af Amer: >60 mL/min (ref >60)
Glucose, Bld: 202 mg/dL — ABNORMAL HIGH (ref 70–99)
Potassium: 5 mmol/L (ref 3.5–5.1)
SODIUM: 139 mmol/L (ref 135–145)
Total Bilirubin: 1.1 mg/dL (ref 0.3–1.2)
Total Protein: 6.6 g/dL (ref 6.5–8.1)

## 2018-05-24 ENCOUNTER — Telehealth: Payer: Self-pay | Admitting: Interventional Cardiology

## 2018-05-24 DIAGNOSIS — I5032 Chronic diastolic (congestive) heart failure: Secondary | ICD-10-CM

## 2018-05-24 DIAGNOSIS — M1711 Unilateral primary osteoarthritis, right knee: Secondary | ICD-10-CM | POA: Diagnosis not present

## 2018-05-24 DIAGNOSIS — I480 Paroxysmal atrial fibrillation: Secondary | ICD-10-CM

## 2018-05-24 MED ORDER — FUROSEMIDE 40 MG PO TABS: 40.0000 mg | ORAL_TABLET | Freq: Every day | ORAL | 3 refills | Status: DC

## 2018-05-24 NOTE — Telephone Encounter (Signed)
He should have a CBC, BNP, and Cmet.  Increase furosemide to 40 mg daily.  The blood work should be done after he has increase the furosemide for at least 3 days.  BNP is significantly elevated he will need to have a 2D Doppler echocardiogram repeated to exclude the possibility of LV systolic dysfunction developed him because of poor rate control.

## 2018-05-24 NOTE — Telephone Encounter (Signed)
New message  Pt c/o swelling: STAT is pt has developed SOB within 24 hours  How much weight have you gained and in what time span? 3 to 4 lbs, within the last month  1) If swelling, where is the swelling located? Swelling in both ankles and feet and legs  2) Are you currently taking a fluid pill?yes  3) Are you currently SOB? No   4) Do you have a log of your daily weights (if so, list)? no  5) Have you gained 3 pounds in a day or 5 pounds in a week? No   Have you traveled recently? no

## 2018-05-24 NOTE — Telephone Encounter (Signed)
Left message to call back  

## 2018-05-24 NOTE — Telephone Encounter (Signed)
Pt c/o swelling in bilateral feet, ankles and calves.  States this has been going on about a month now.  Goes away at night but returns by dinner time the next day.  Denies salt in diet.  Denies SOB.  Denies pain, states more just uncomfortable.  Doesn't weigh at home but weight at Neuro on 8/6 was 284lbs and pt states he seen Dr. Forde Dandy on 8/14 and it was 285/286lbs.  Pt currently on Furosemide 20mg  QD.  Advised I will send message to Dr. Tamala Julian for review.

## 2018-05-24 NOTE — Telephone Encounter (Signed)
Spoke with pt and went over results and recommendations per Dr. Tamala Julian.  Pt agreeable to come to labs next week.  Pt verbalized understanding and was appreciative for call.

## 2018-05-29 DIAGNOSIS — R69 Illness, unspecified: Secondary | ICD-10-CM | POA: Diagnosis not present

## 2018-05-30 ENCOUNTER — Other Ambulatory Visit: Payer: Medicare HMO | Admitting: *Deleted

## 2018-05-30 DIAGNOSIS — I480 Paroxysmal atrial fibrillation: Secondary | ICD-10-CM

## 2018-05-30 DIAGNOSIS — I5032 Chronic diastolic (congestive) heart failure: Secondary | ICD-10-CM

## 2018-05-30 LAB — CBC
HEMATOCRIT: 37.7 % (ref 37.5–51.0)
Hemoglobin: 12.6 g/dL — ABNORMAL LOW (ref 13.0–17.7)
MCH: 33.5 pg — ABNORMAL HIGH (ref 26.6–33.0)
MCHC: 33.4 g/dL (ref 31.5–35.7)
MCV: 100 fL — AB (ref 79–97)
Platelets: 676 10*3/uL — ABNORMAL HIGH (ref 150–450)
RBC: 3.76 x10E6/uL — ABNORMAL LOW (ref 4.14–5.80)
RDW: 16.3 % — AB (ref 12.3–15.4)
WBC: 6.3 10*3/uL (ref 3.4–10.8)

## 2018-05-30 LAB — BASIC METABOLIC PANEL
BUN/Creatinine Ratio: 17 (ref 10–24)
BUN: 15 mg/dL (ref 8–27)
CALCIUM: 9.1 mg/dL (ref 8.6–10.2)
CHLORIDE: 100 mmol/L (ref 96–106)
CO2: 25 mmol/L (ref 20–29)
Creatinine, Ser: 0.87 mg/dL (ref 0.76–1.27)
GFR calc Af Amer: 98 mL/min/{1.73_m2} (ref >59)
GFR calc non Af Amer: 85 mL/min/{1.73_m2} (ref >59)
GLUCOSE: 162 mg/dL — AB (ref 65–99)
Potassium: 4.7 mmol/L (ref 3.5–5.2)
Sodium: 137 mmol/L (ref 134–144)

## 2018-05-30 LAB — HEPATIC FUNCTION PANEL
ALT: 25 IU/L (ref 0–44)
AST: 17 IU/L (ref 0–40)
Albumin: 3.6 g/dL (ref 3.5–4.8)
Alkaline Phosphatase: 110 IU/L (ref 39–117)
BILIRUBIN TOTAL: 0.7 mg/dL (ref 0.0–1.2)
BILIRUBIN, DIRECT: 0.23 mg/dL (ref 0.00–0.40)
TOTAL PROTEIN: 5.8 g/dL — AB (ref 6.0–8.5)

## 2018-05-30 LAB — PRO B NATRIURETIC PEPTIDE: NT-Pro BNP: 382 pg/mL — ABNORMAL HIGH (ref 0–376)

## 2018-06-04 DIAGNOSIS — R69 Illness, unspecified: Secondary | ICD-10-CM | POA: Diagnosis not present

## 2018-06-05 ENCOUNTER — Telehealth: Payer: Self-pay | Admitting: *Deleted

## 2018-06-05 DIAGNOSIS — I5032 Chronic diastolic (congestive) heart failure: Secondary | ICD-10-CM

## 2018-06-05 MED ORDER — FUROSEMIDE 40 MG PO TABS: 40.0000 mg | ORAL_TABLET | Freq: Every day | ORAL | 3 refills | Status: DC

## 2018-06-05 NOTE — Progress Notes (Signed)
Thanks

## 2018-06-05 NOTE — Telephone Encounter (Signed)
-----   Message from Belva Crome, MD sent at 05/31/2018 10:09 PM EDT ----- Let the patient know labs are okay. Did the diuretic increase for a few days make a difference? A copy will be sent to [name], MD

## 2018-06-05 NOTE — Telephone Encounter (Signed)
Spoke with pt.  Pt never increased his Furosemide.  States [facility] never contacted him to let him know prescription was ready.  Swelling not much better.  Advised pt to go ahead and increase Furosemide to 40mg  QD (can take two 20mg  tabs).  Pt will come for labs on 9/30.  Pt verbalized understanding and was in agreement with this plan. Will forward to Dr. Tamala Julian to make him aware.

## 2018-06-11 ENCOUNTER — Other Ambulatory Visit: Payer: Medicare HMO | Admitting: *Deleted

## 2018-06-11 DIAGNOSIS — E875 Hyperkalemia: Secondary | ICD-10-CM

## 2018-06-11 DIAGNOSIS — I5032 Chronic diastolic (congestive) heart failure: Secondary | ICD-10-CM | POA: Diagnosis not present

## 2018-06-12 DIAGNOSIS — H5203 Hypermetropia, bilateral: Secondary | ICD-10-CM | POA: Diagnosis not present

## 2018-06-12 DIAGNOSIS — H52203 Unspecified astigmatism, bilateral: Secondary | ICD-10-CM | POA: Diagnosis not present

## 2018-06-12 DIAGNOSIS — H43813 Vitreous degeneration, bilateral: Secondary | ICD-10-CM | POA: Diagnosis not present

## 2018-06-12 DIAGNOSIS — H43393 Other vitreous opacities, bilateral: Secondary | ICD-10-CM | POA: Diagnosis not present

## 2018-06-12 DIAGNOSIS — H524 Presbyopia: Secondary | ICD-10-CM | POA: Diagnosis not present

## 2018-06-12 DIAGNOSIS — E113293 Type 2 diabetes mellitus with mild nonproliferative diabetic retinopathy without macular edema, bilateral: Secondary | ICD-10-CM | POA: Diagnosis not present

## 2018-06-12 DIAGNOSIS — H35373 Puckering of macula, bilateral: Secondary | ICD-10-CM | POA: Diagnosis not present

## 2018-06-12 DIAGNOSIS — H2513 Age-related nuclear cataract, bilateral: Secondary | ICD-10-CM | POA: Diagnosis not present

## 2018-06-12 DIAGNOSIS — Z794 Long term (current) use of insulin: Secondary | ICD-10-CM | POA: Diagnosis not present

## 2018-06-12 LAB — BASIC METABOLIC PANEL
BUN/Creatinine Ratio: 17 (ref 10–24)
BUN: 17 mg/dL (ref 8–27)
CALCIUM: 9.1 mg/dL (ref 8.6–10.2)
CO2: 25 mmol/L (ref 20–29)
Chloride: 100 mmol/L (ref 96–106)
Creatinine, Ser: 0.98 mg/dL (ref 0.76–1.27)
GFR calc Af Amer: 87 mL/min/{1.73_m2} (ref >59)
GFR calc non Af Amer: 76 mL/min/{1.73_m2} (ref >59)
Glucose: 232 mg/dL — ABNORMAL HIGH (ref 65–99)
Potassium: 5.4 mmol/L — ABNORMAL HIGH (ref 3.5–5.2)
Sodium: 139 mmol/L (ref 134–144)

## 2018-06-12 NOTE — Addendum Note (Signed)
Addended by: Loren Racer on: 06/12/2018 06:10 PM   Modules accepted: Orders

## 2018-06-13 DIAGNOSIS — R69 Illness, unspecified: Secondary | ICD-10-CM | POA: Diagnosis not present

## 2018-06-15 ENCOUNTER — Telehealth: Payer: Self-pay | Admitting: *Deleted

## 2018-06-15 NOTE — Telephone Encounter (Signed)
Sent message to scheduling to arrange appt for clearance with either Dr. Tamala Julian or PA at [facility] office.  LVM for pt to inform someone will be calling him to schedule appt for clearance.  LVM with [facility] surgery scheduling to inform them that pt will be scheduled for clearance appt.

## 2018-06-15 NOTE — Telephone Encounter (Signed)
   [name]    Request for surgical clearance:  1. What type of surgery is being performed? Right Total Knee  2. When is this surgery scheduled? Tentatively 10/08/2018  3. What type of clearance is required (medical clearance vs. Pharmacy clearance to hold med vs. Both)? Both  4. Are there any medications that need to be held prior to surgery and how long?TBD pending Provider Advisory  5. Practice name and name of physician performing surgery? [facility],   6. What is your office phone number [phone]   7.   What is your office fax number [phone]  8.   Anesthesia type (None, local, MAC, general) ? Choice    [name] 06/15/2018, 3:28 PM  _________________________________________________________________   (provider comments below)

## 2018-06-15 NOTE — Telephone Encounter (Signed)
   Primary Cardiologist:Henry Nicholes Stairs III, MD  Chart reviewed as part of pre-operative protocol coverage. Because of MANOLITO JUREWICZ past medical history and time since last visit, he/she will require a follow-up visit in order to better assess preoperative cardiovascular risk.  Pre-op covering staff: - Please schedule appointment and call patient to inform them. - Please contact requesting surgeon's office via preferred method (i.e, phone, fax) to inform them of need for appointment prior to surgery.  Kathyrn Drown, NP  06/15/2018, 3:57 PM

## 2018-06-18 ENCOUNTER — Other Ambulatory Visit: Payer: Medicare HMO | Admitting: *Deleted

## 2018-06-18 DIAGNOSIS — E875 Hyperkalemia: Secondary | ICD-10-CM

## 2018-06-18 NOTE — Telephone Encounter (Signed)
Spoke with patient who states surgery is scheduled for 10/08/17. He already is scheduled with Dr. Tamala Julian on 07/30/18. Cardiac clearance to be discussed at that follow up visit.

## 2018-06-19 LAB — BASIC METABOLIC PANEL
BUN / CREAT RATIO: 18 (ref 10–24)
BUN: 17 mg/dL (ref 8–27)
CALCIUM: 9.4 mg/dL (ref 8.6–10.2)
CHLORIDE: 103 mmol/L (ref 96–106)
CO2: 24 mmol/L (ref 20–29)
CREATININE: 0.94 mg/dL (ref 0.76–1.27)
GFR calc Af Amer: 92 mL/min/{1.73_m2} (ref >59)
GFR calc non Af Amer: 80 mL/min/{1.73_m2} (ref >59)
GLUCOSE: 152 mg/dL — AB (ref 65–99)
Potassium: 5.1 mmol/L (ref 3.5–5.2)
Sodium: 142 mmol/L (ref 134–144)

## 2018-06-20 ENCOUNTER — Inpatient Hospital Stay: Payer: Medicare HMO | Attending: Oncology

## 2018-06-20 ENCOUNTER — Encounter: Payer: Self-pay | Admitting: Oncology

## 2018-06-20 DIAGNOSIS — Z79899 Other long term (current) drug therapy: Secondary | ICD-10-CM | POA: Insufficient documentation

## 2018-06-20 DIAGNOSIS — I63512 Cerebral infarction due to unspecified occlusion or stenosis of left middle cerebral artery: Secondary | ICD-10-CM

## 2018-06-20 DIAGNOSIS — Z794 Long term (current) use of insulin: Secondary | ICD-10-CM

## 2018-06-20 DIAGNOSIS — D473 Essential (hemorrhagic) thrombocythemia: Secondary | ICD-10-CM | POA: Diagnosis not present

## 2018-06-20 DIAGNOSIS — E119 Type 2 diabetes mellitus without complications: Secondary | ICD-10-CM

## 2018-06-20 DIAGNOSIS — I480 Paroxysmal atrial fibrillation: Secondary | ICD-10-CM | POA: Insufficient documentation

## 2018-06-20 DIAGNOSIS — R911 Solitary pulmonary nodule: Secondary | ICD-10-CM | POA: Insufficient documentation

## 2018-06-20 DIAGNOSIS — E0842 Diabetes mellitus due to underlying condition with diabetic polyneuropathy: Secondary | ICD-10-CM

## 2018-06-20 DIAGNOSIS — D75839 Thrombocytosis, unspecified: Secondary | ICD-10-CM

## 2018-06-20 DIAGNOSIS — I495 Sick sinus syndrome: Secondary | ICD-10-CM

## 2018-06-20 DIAGNOSIS — E08311 Diabetes mellitus due to underlying condition with unspecified diabetic retinopathy with macular edema: Secondary | ICD-10-CM

## 2018-06-20 DIAGNOSIS — D689 Coagulation defect, unspecified: Secondary | ICD-10-CM

## 2018-06-20 DIAGNOSIS — I5032 Chronic diastolic (congestive) heart failure: Secondary | ICD-10-CM

## 2018-06-20 LAB — COMPREHENSIVE METABOLIC PANEL
ALBUMIN: 3.4 g/dL — AB (ref 3.5–5.0)
ALT: 28 U/L (ref 0–44)
AST: 23 U/L (ref 15–41)
Alkaline Phosphatase: 109 U/L (ref 38–126)
Anion gap: 6 (ref 5–15)
BILIRUBIN TOTAL: 0.8 mg/dL (ref 0.3–1.2)
BUN: 14 mg/dL (ref 8–23)
CO2: 29 mmol/L (ref 22–32)
Calcium: 9 mg/dL (ref 8.9–10.3)
Chloride: 104 mmol/L (ref 98–111)
Creatinine, Ser: 0.93 mg/dL (ref 0.61–1.24)
GFR calc Af Amer: >60 mL/min (ref >60)
GFR calc non Af Amer: >60 mL/min (ref >60)
Glucose, Bld: 266 mg/dL — ABNORMAL HIGH (ref 70–99)
POTASSIUM: 4.9 mmol/L (ref 3.5–5.1)
Sodium: 139 mmol/L (ref 135–145)
TOTAL PROTEIN: 6.4 g/dL — AB (ref 6.5–8.1)

## 2018-06-20 LAB — CBC WITH DIFFERENTIAL/PLATELET
Abs Immature Granulocytes: 0.02 10*3/uL (ref 0.00–0.07)
BASOS ABS: 0.1 10*3/uL (ref 0.0–0.1)
Basophils Relative: 2 %
Eosinophils Absolute: 0.1 10*3/uL (ref 0.0–0.5)
Eosinophils Relative: 1 %
HEMATOCRIT: 36.8 % — AB (ref 39.0–52.0)
Hemoglobin: 12.3 g/dL — ABNORMAL LOW (ref 13.0–17.0)
Immature Granulocytes: 0 %
LYMPHS ABS: 1.3 10*3/uL (ref 0.7–4.0)
Lymphocytes Relative: 25 %
MCH: 34.3 pg — ABNORMAL HIGH (ref 26.0–34.0)
MCHC: 33.4 g/dL (ref 30.0–36.0)
MCV: 102.5 fL — AB (ref 80.0–100.0)
MONOS PCT: 9 %
Monocytes Absolute: 0.5 10*3/uL (ref 0.1–1.0)
NEUTROS PCT: 63 %
NRBC: 0.4 % — AB (ref 0.0–0.2)
Neutro Abs: 3.2 10*3/uL (ref 1.7–7.7)
Platelets: 575 10*3/uL — ABNORMAL HIGH (ref 150–400)
RBC: 3.59 MIL/uL — ABNORMAL LOW (ref 4.22–5.81)
RDW: 15.6 % — ABNORMAL HIGH (ref 11.5–15.5)
WBC: 5.2 10*3/uL (ref 4.0–10.5)

## 2018-07-18 ENCOUNTER — Telehealth: Payer: Self-pay | Admitting: Oncology

## 2018-07-18 ENCOUNTER — Inpatient Hospital Stay: Payer: Medicare HMO | Attending: Oncology | Admitting: Oncology

## 2018-07-18 ENCOUNTER — Inpatient Hospital Stay: Payer: Medicare HMO

## 2018-07-18 VITALS — BP 104/42 | HR 78 | Temp 98.7°F | Resp 18 | Ht 72.0 in | Wt 282.6 lb

## 2018-07-18 DIAGNOSIS — R918 Other nonspecific abnormal finding of lung field: Secondary | ICD-10-CM | POA: Diagnosis not present

## 2018-07-18 DIAGNOSIS — E119 Type 2 diabetes mellitus without complications: Secondary | ICD-10-CM

## 2018-07-18 DIAGNOSIS — Z794 Long term (current) use of insulin: Secondary | ICD-10-CM

## 2018-07-18 DIAGNOSIS — E08311 Diabetes mellitus due to underlying condition with unspecified diabetic retinopathy with macular edema: Secondary | ICD-10-CM

## 2018-07-18 DIAGNOSIS — I495 Sick sinus syndrome: Secondary | ICD-10-CM

## 2018-07-18 DIAGNOSIS — Z79899 Other long term (current) drug therapy: Secondary | ICD-10-CM | POA: Diagnosis not present

## 2018-07-18 DIAGNOSIS — D473 Essential (hemorrhagic) thrombocythemia: Secondary | ICD-10-CM

## 2018-07-18 DIAGNOSIS — E0842 Diabetes mellitus due to underlying condition with diabetic polyneuropathy: Secondary | ICD-10-CM

## 2018-07-18 DIAGNOSIS — I480 Paroxysmal atrial fibrillation: Secondary | ICD-10-CM

## 2018-07-18 DIAGNOSIS — D75839 Thrombocytosis, unspecified: Secondary | ICD-10-CM

## 2018-07-18 DIAGNOSIS — I63512 Cerebral infarction due to unspecified occlusion or stenosis of left middle cerebral artery: Secondary | ICD-10-CM

## 2018-07-18 DIAGNOSIS — D689 Coagulation defect, unspecified: Secondary | ICD-10-CM

## 2018-07-18 DIAGNOSIS — I5032 Chronic diastolic (congestive) heart failure: Secondary | ICD-10-CM

## 2018-07-18 LAB — COMPREHENSIVE METABOLIC PANEL
ALK PHOS: 97 U/L (ref 38–126)
ALT: 28 U/L (ref 0–44)
AST: 19 U/L (ref 15–41)
Albumin: 3.3 g/dL — ABNORMAL LOW (ref 3.5–5.0)
Anion gap: 6 (ref 5–15)
BUN: 13 mg/dL (ref 8–23)
CALCIUM: 8.9 mg/dL (ref 8.9–10.3)
CHLORIDE: 103 mmol/L (ref 98–111)
CO2: 29 mmol/L (ref 22–32)
CREATININE: 0.96 mg/dL (ref 0.61–1.24)
GFR calc Af Amer: >60 mL/min (ref >60)
Glucose, Bld: 294 mg/dL — ABNORMAL HIGH (ref 70–99)
Potassium: 4.8 mmol/L (ref 3.5–5.1)
SODIUM: 138 mmol/L (ref 135–145)
TOTAL PROTEIN: 6.3 g/dL — AB (ref 6.5–8.1)
Total Bilirubin: 0.8 mg/dL (ref 0.3–1.2)

## 2018-07-18 LAB — CBC WITH DIFFERENTIAL/PLATELET
Abs Immature Granulocytes: 0.02 10*3/uL (ref 0.00–0.07)
BASOS PCT: 2 %
Basophils Absolute: 0.1 10*3/uL (ref 0.0–0.1)
Eosinophils Absolute: 0.1 10*3/uL (ref 0.0–0.5)
Eosinophils Relative: 2 %
HCT: 38.8 % — ABNORMAL LOW (ref 39.0–52.0)
HEMOGLOBIN: 13 g/dL (ref 13.0–17.0)
IMMATURE GRANULOCYTES: 0 %
LYMPHS PCT: 20 %
Lymphs Abs: 1.2 10*3/uL (ref 0.7–4.0)
MCH: 33.9 pg (ref 26.0–34.0)
MCHC: 33.5 g/dL (ref 30.0–36.0)
MCV: 101.3 fL — AB (ref 80.0–100.0)
MONO ABS: 0.5 10*3/uL (ref 0.1–1.0)
MONOS PCT: 9 %
NEUTROS ABS: 3.9 10*3/uL (ref 1.7–7.7)
NEUTROS PCT: 67 %
PLATELETS: 624 10*3/uL — AB (ref 150–400)
RBC: 3.83 MIL/uL — ABNORMAL LOW (ref 4.22–5.81)
RDW: 15.6 % — ABNORMAL HIGH (ref 11.5–15.5)
WBC: 5.9 10*3/uL (ref 4.0–10.5)
nRBC: 0.5 % — ABNORMAL HIGH (ref 0.0–0.2)

## 2018-07-18 MED ORDER — HYDROXYUREA 500 MG PO CAPS: 1000.0000 mg | ORAL_CAPSULE | Freq: Every day | ORAL | 12 refills | Status: DC

## 2018-07-18 NOTE — Progress Notes (Signed)
[name]  [phone] [phone]     ID: DELVIN HEDEEN DOB: Jun 24, 1941  MR#: [id number]  GUY#:[id number]  Patient Care Team: [name], MD as PCP - General (Endocrinology) Belva Crome, MD as PCP - Cardiology (Cardiology) , Virgie Dad, MD as Consulting Physician (Oncology) Irene Shipper, MD as Consulting Physician (Gastroenterology) Lavonna Monarch, MD as Consulting Physician (Dermatology) Roel Cluck, MD as Referring Physician (Ophthalmology) Garvin Fila, MD as Consulting Physician (Neurology) Chauncey Cruel, MD OTHER MD:  CHIEF COMPLAINT: Essential thrombocytosis  CURRENT TREATMENT:  hydroxyurea   HISTORY OF CURRENT ILLNESS: From the original intake note:  Dr. Forde Dandy obtained lab work on [name] 09/25/2017 showing a platelet count of 1,035,000.  He referred him at that point for further evaluation and sent [place] his prior lab work.  The earliest platelet count I have is from 03/18/2013.  It was normal then at 263,000.  On 07/12/2015 the platelet count was 559,000.  On 08/10/2016 the platelet count was 725,000.  On 08/09/2017 the platelet count was 881,000  Prior to today's visit I se upt [name] for some additional lab work.  This was performed 09/29/2017.  On that date his white cell count was 10.4, with the absolute neutrophil count 8.0.  Hemoglobin was 14.0 with an MCV of 86.7.  The platelet count was 895,000  The labs obtained on that date included a ferritin of 103, iron saturation of 76%, C-reactive protein less than 0.8, LDH 209 and BCR/ABL not detected.  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  The patient's subsequent history is as detailed below.  INTERVAL HISTORY: [name] returns today for follow-up and treatment of his essential thrombocytosis.   The continues on Hydrea, with good  tolerance. He denies nausea. He notes that he is unsure on how much he pays for this medication.   Since his last visit here, he underwent a Chest CT w Contrast on 02/28/2018, showing stable 7 mm right upper lobe pulmonary nodule. Other smaller sub-cm right lung nodules are also stable. Recommend continued follow-up by chest CT without contrast in 12 months.  Aortic Atherosclerosis (ICD10-I70.0). Coronary artery calcification.  REVIEW OF SYSTEMS: Koray reports that for exercise, he walks to his car and back inside his home, he walks around his home, and he notes that he doesn't get much exercise. He used to participate in [employer] and he has a Higher education careers adviser at [employer], however, he hasn't continued with those exercises lately. He is in need of a right knee replacement to be performed by Dr. Maureen Ralphs. He notes that he has had tingling and pain sensations to his right foot, he is contributing these symptoms to the foods that he consumes. He denies neuropathy in his fingers. 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 He notes that within the last month or so, he has added on another gabapentin and it has alleviated his symptoms. He denies the gabapentin making him sleepy. His cardiologist is [name] and he will follow up with Dr. Tamala Julian within the next couple of weeks. He denies unusual headaches, visual changes, nausea, vomiting, or dizziness. There has been no unusual cough, phlegm production, or pleurisy. This been no change in bowel or bladder habits. He denies unexplained fatigue or unexplained weight loss, bleeding, rash, or fever. A detailed review of  systems was otherwise stable.     PAST MEDICAL HISTORY: Past Medical History:  Diagnosis Date  . Anxiety   . Arthritis   . Cancer (HCC)    skin - basil cell  . Depression   . Diabetes mellitus without complication ([place])   . Dysrhythmia    a-fib  . GERD (gastroesophageal reflux  disease)   . Hyperlipidemia   . Hypertension   . Neuropathy   . Obesity   . Paroxysmal atrial fibrillation (HCC)   . Peripheral vascular disease ([place])    diabetic neuropathy in both feet  . Sleep apnea    uses C-pap machine  . Stroke [facility])     PAST SURGICAL HISTORY: Past Surgical History:  Procedure Laterality Date  . APPENDECTOMY  1962  . BACK SURGERY  00-02-12   x3  . BASAL CELL CARCINOMA EXCISION  93/06/10  . COLONOSCOPY    . KNEE ARTHROSCOPY  005/01/02  . 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 Cancer Mother   . Diabetes Mellitus II Mother   . Hypertension Mother   . Heart failure Father   . CVA Father   . Hypertension Sister   . Colon cancer Neg Hx   Patient's father died at age 53 shortly following a CABG.  The patient's mother died at age 53 from cancer but the patient does not know what type.  The patient has 3 brothers, 1 sister.  The sister died at age 59 following a stroke.  One brother had heart disease but is now doing well.  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  He is now retired.  He has 2 children from his first marriage, Dellis Filbert, who is a Music therapist and a med Designer, multimedia in [place], 52, and Diboll, who works for the [employer] in [place], [place].  The patient has 4 biological grandchildren.  His second wife, Webb Silversmith, has 2 children of her own, Hilliard Clark, 38, who is in [place] and works as a Higher education careers adviser at [employer], 44, who is a Management consultant in [place].  The patient is not a church attender    ADVANCED DIRECTIVES:    HEALTH MAINTENANCE: Social History   Tobacco Use  . Smoking status: Former Smoker    Types: Cigars    Last attempt to quit: 08/02/1992    Years since quitting: 25.9  .  Smokeless tobacco: Never Used  Substance Use Topics  . Alcohol use: Yes    Alcohol/week: 0.0 standard drinks    Comment: 1 beer a night.   . Drug use: No     Colonoscopy: 05/12/2015  Bone density:  PSA:   No Known Allergies  Current Outpatient Medications  Medication Sig Dispense Refill  . ALPRAZolam (XANAX) 0.5 MG tablet Take 0.5 mg by mouth 2 (two) times daily.     [name] Kitchen apixaban (ELIQUIS) 5 MG TABS tablet Take 1 tablet (5 mg total) by mouth 2 (two) times daily. 180 tablet 1  . atorvastatin (LIPITOR) 80 MG tablet Take 1 tablet (80 mg total) by mouth daily at 6 PM. 30 tablet 0  . benazepril (LOTENSIN) 40 MG tablet Take 1 tablet (40 mg total) by mouth daily.    . ergocalciferol (VITAMIN D2) 50000 units capsule Take  50,000 Units by mouth 2 (two) times a week.    . furosemide (LASIX) 40 MG tablet Take 1 tablet (40 mg total) by mouth daily. 90 tablet 3  . gabapentin (NEURONTIN) 300 MG capsule Take 2 capsules (600 mg total) by mouth at bedtime. 180 capsule 3  . hydroxyurea (HYDREA) 500 MG capsule Take 2 capsules (1,000 mg total) by mouth daily. May take with food to minimize GI side effects. 180 capsule 12  . insulin NPH-regular Human (NOVOLIN 70/30) (70-30) 100 UNIT/ML injection Inject 44 Units into the skin 2 (two) times daily with a meal.    . metFORMIN (GLUCOPHAGE) 500 MG tablet Take 250 mg by mouth 2 (two) times daily with a meal.    . Multiple Vitamins-Minerals (MULTIVITAMIN ADULT PO) Take 1 tablet by mouth daily.    [name] Kitchen omeprazole (PRILOSEC) 20 MG capsule Take 20 mg by mouth daily.    [name] Kitchen venlafaxine XR (EFFEXOR-XR) 75 MG 24 hr capsule Take 75 mg by mouth daily.     No current facility-administered medications for this visit.     OBJECTIVE: Morbidly obese white man who walks with a limp secondary to right knee problems  Vitals:   07/18/18 1201  BP: (!) 104/42  Pulse: 78  Resp: 18  Temp: 98.7 F (37.1 C)  SpO2: 97%     Body mass index is 38.33 kg/m.   Wt Readings from Last 3  Encounters:  07/18/18 282 lb 9.6 oz (128.2 kg)  04/17/18 284 lb (128.8 kg)  01/31/18 281 lb 1.6 oz (127.5 kg)      ECOG FS:2 - Symptomatic, <50% confined to bed  Sclerae unicteric, pupils round and equal No cervical or supraclavicular adenopathy Lungs no rales or rhonchi Heart regular rate and rhythm Abd soft, obese, nontender, positive bowel sounds MSK no focal spinal tenderness, bilateral lower extremity edema, grade 1 Neuro: nonfocal, well oriented, appropriate affect     LAB RESULTS:  Results for KOLBY, SCHARA (MRN [id number]) as of 07/18/2018 11:24  Ref. Range 03/28/2018 13:03 04/25/2018 12:50 05/23/2018 13:10 05/30/2018 11:49 06/20/2018 13:27  Platelets Latest Ref Range: 150 - 400 K/uL 562 (H) 588 (H) 612 (H) 676 (H) 575 (H)    CMP     Component Value Date/Time   NA 139 06/20/2018 1327   NA 142 06/18/2018 1440   K 4.9 06/20/2018 1327   CL 104 06/20/2018 1327   CO2 29 06/20/2018 1327   GLUCOSE 266 (H) 06/20/2018 1327   BUN 14 06/20/2018 1327   BUN 17 06/18/2018 1440   CREATININE 0.93 06/20/2018 1327   CALCIUM 9.0 06/20/2018 1327   PROT 6.4 (L) 06/20/2018 1327   PROT 5.8 (L) 05/30/2018 1149   ALBUMIN 3.4 (L) 06/20/2018 1327   ALBUMIN 3.6 05/30/2018 1149   AST 23 06/20/2018 1327   ALT 28 06/20/2018 1327   ALKPHOS 109 06/20/2018 1327   BILITOT 0.8 06/20/2018 1327   BILITOT 0.7 05/30/2018 1149   GFRNONAA >60 06/20/2018 1327   GFRAA >60 06/20/2018 1327    No results found for: TOTALPROTELP, ALBUMINELP, A1GS, A2GS, BETS, BETA2SER, GAMS, MSPIKE, SPEI  No results found for: KPAFRELGTCHN, LAMBDASER, KAPLAMBRATIO  Lab Results  Component Value Date   WBC 5.9 07/18/2018   NEUTROABS 3.9 07/18/2018   HGB 13.0 07/18/2018   HCT 38.8 (L) 07/18/2018   MCV 101.3 (H) 07/18/2018   PLT 624 (H) 07/18/2018    '@LASTCHEMISTRY' @  No results found for: LABCA2  No components found for: FHLKTG256  No results  for input(s): INR in the last 168 hours.  No results found for:  LABCA2  No results found for: RVI153  No results found for: PHK327  No results found for: MDY709  No results found for: CA2729  No components found for: HGQUANT  No results found for: CEA1 / No results found for: CEA1   No results found for: AFPTUMOR  No results found for: CHROMOGRNA  No results found for: PSA1  Appointment on 07/18/2018  Component Date Value Ref Range Status  . WBC 07/18/2018 5.9  4.0 - 10.5 K/uL Final  . RBC 07/18/2018 3.83* 4.22 - 5.81 MIL/uL Final  . Hemoglobin 07/18/2018 13.0  13.0 - 17.0 g/dL Final  . HCT 07/18/2018 38.8* 39.0 - 52.0 % Final  . MCV 07/18/2018 101.3* 80.0 - 100.0 fL Final  . MCH 07/18/2018 33.9  26.0 - 34.0 pg Final  . MCHC 07/18/2018 33.5  30.0 - 36.0 g/dL Final  . RDW 07/18/2018 15.6* 11.5 - 15.5 % Final  . Platelets 07/18/2018 624* 150 - 400 K/uL Final  . nRBC 07/18/2018 0.5* 0.0 - 0.2 % Final  . Neutrophils Relative % 07/18/2018 67  % Final  . Neutro Abs 07/18/2018 3.9  1.7 - 7.7 K/uL Final  . Lymphocytes Relative 07/18/2018 20  % Final  . Lymphs Abs 07/18/2018 1.2  0.7 - 4.0 K/uL Final  . Monocytes Relative 07/18/2018 9  % Final  . Monocytes Absolute 07/18/2018 0.5  0.1 - 1.0 K/uL Final  . Eosinophils Relative 07/18/2018 2  % Final  . Eosinophils Absolute 07/18/2018 0.1  0.0 - 0.5 K/uL Final  . Basophils Relative 07/18/2018 2  % Final  . Basophils Absolute 07/18/2018 0.1  0.0 - 0.1 K/uL Final  . Immature Granulocytes 07/18/2018 0  % Final  . Abs Immature Granulocytes 07/18/2018 0.02  0.00 - 0.07 K/uL Final   Performed at [facility], [place] [address] [place], [place] [address]    (this displays the last labs from the last 3 days)  No results found for: TOTALPROTELP, ALBUMINELP, A1GS, A2GS, BETS, BETA2SER, GAMS, MSPIKE, SPEI (this displays SPEP labs)  No results found for: KPAFRELGTCHN, LAMBDASER, KAPLAMBRATIO (kappa/lambda light chains)  No results found for: HGBA, HGBA2QUANT, HGBFQUANT,  HGBSQUAN (Hemoglobinopathy evaluation)   Lab Results  Component Value Date   LDH 209 09/29/2017    Lab Results  Component Value Date   IRON 166 (H) 09/29/2017   TIBC 219 09/29/2017   IRONPCTSAT 76 09/29/2017   (Iron and TIBC)  Lab Results  Component Value Date   FERRITIN 143 09/29/2017    Urinalysis    Component Value Date/Time   COLORURINE YELLOW 10/26/2015 1013   APPEARANCEUR CLEAR 10/26/2015 1013   LABSPEC 1.017 10/26/2015 1013   PHURINE 5.0 10/26/2015 1013   GLUCOSEU >1000 (A) 10/26/2015 1013   HGBUR NEGATIVE 10/26/2015 [place] 10/26/2015 [place] 10/26/2015 1013   PROTEINUR NEGATIVE 10/26/2015 1013   NITRITE NEGATIVE 10/26/2015 1013   LEUKOCYTESUR NEGATIVE 10/26/2015 1013     STUDIES: Chest CT w Contrast on 02/28/2018, showing stable 7 mm right upper lobe pulmonary nodule. Other smaller sub-cm right lung nodules are also stable. Recommend continued follow-up by chest CT without contrast in 12 months. This recommendation follows the consensus statement: Guidelines for Management of Small Pulmonary Nodules Detected on CT Images: From the Fleischner Society 2017; Radiology 2017; 284:228-243. Aortic Atherosclerosis (ICD10-I70.0). Coronary artery calcification.  REVIEW OF BLOOD FILM: Blood film from  09/29/2017, there is no significant red cell abnormality, and particularly no poikilocytosis or nucleated red cells.  There are no platelet clumps and no giant platelets.  There is minimal left shift in the white cell series, with a few bands.    ELIGIBLE FOR AVAILABLE RESEARCH PROTOCOL: no  ASSESSMENT: 77 y.o. [place] Man with a platelet count in excess of 1 million 09/25/2017, a positive Jak2 V617F mutation, and normal iron studies, normal C-reactive protein, and negative BCR-ABL by PCR  (1) essential thrombocytosis: Started on Hydrea 1000 mg a day 10/06/2017  (2) bone marrow biopsy discussed, postponed  (3) 0.7 cm right upper  lung nodule incidentally noted on neck CT scans November 2018  (a) no change on repeat CT of the chest with contrast 02/28/2018  PLAN: Gibril is tolerating Hydrea 1 g daily without any side effects.  It is keeping his platelet count between 575 and 675 very stably.  We certainly could increase the Hydrea dose to 1.5 g daily and try to get the platelets closer to 400,000, but I am not sure that would really make any difference in the long run.  It might on the other hand causing some side effects and also drop his hemoglobin and white cell count which currently are not affected  Accordingly we are going to continue the Hydrea at 1 g daily indefinitely.  We are is changing his lab monitoring to every 3 months.  He needs one more CT scan of the chest to make sure the nodule we are following is scar as it most likely is.  That will be scheduled for December 2020.  He will see me October 2020  He knows to call for any other issues that may develop before that visit.   , Virgie Dad, MD  07/18/18 12:24 PM Medical Oncology and Hematology [facility] [address] [place], [place] [address] Tel. [phone]    Fax. [phone]    I, Soijett Blue am acting as scribe for Dr. Sarajane Jews C. .  I, Lurline Del MD, have reviewed the above documentation for accuracy and completeness, and I agree with the above.

## 2018-07-18 NOTE — Telephone Encounter (Signed)
Gave pt avs and calendar  °

## 2018-07-25 DIAGNOSIS — G4733 Obstructive sleep apnea (adult) (pediatric): Secondary | ICD-10-CM | POA: Diagnosis not present

## 2018-07-25 DIAGNOSIS — D473 Essential (hemorrhagic) thrombocythemia: Secondary | ICD-10-CM | POA: Diagnosis not present

## 2018-07-25 DIAGNOSIS — I10 Essential (primary) hypertension: Secondary | ICD-10-CM | POA: Diagnosis not present

## 2018-07-25 DIAGNOSIS — E1149 Type 2 diabetes mellitus with other diabetic neurological complication: Secondary | ICD-10-CM | POA: Diagnosis not present

## 2018-07-25 DIAGNOSIS — I6389 Other cerebral infarction: Secondary | ICD-10-CM | POA: Diagnosis not present

## 2018-07-25 DIAGNOSIS — E1142 Type 2 diabetes mellitus with diabetic polyneuropathy: Secondary | ICD-10-CM | POA: Diagnosis not present

## 2018-07-25 DIAGNOSIS — I480 Paroxysmal atrial fibrillation: Secondary | ICD-10-CM | POA: Diagnosis not present

## 2018-07-25 DIAGNOSIS — R911 Solitary pulmonary nodule: Secondary | ICD-10-CM | POA: Diagnosis not present

## 2018-07-25 DIAGNOSIS — N401 Enlarged prostate with lower urinary tract symptoms: Secondary | ICD-10-CM | POA: Diagnosis not present

## 2018-07-25 DIAGNOSIS — E7849 Other hyperlipidemia: Secondary | ICD-10-CM | POA: Diagnosis not present

## 2018-07-28 NOTE — Progress Notes (Signed)
Cardiology Office Note:    Date:  07/30/2018   ID:  [name], DOB Nov 11, 1940, MRN [id number]  PCP:  [name], MD  Cardiologist:  [name], MD   Referring MD: [name], MD   Chief Complaint  Patient presents with  . Atrial Fibrillation  . Congestive Heart Failure  . Advice Only    Pre op clearance for knee replacement    History of Present Illness:    [name] is a 77 y.o. male with a hx of asymptomatic atrial fibrillation, obstructive sleep apnea, hypertension, diabetes, PVD, and hyperlipidemia.   He needs clearance for orthopedic surgery, total knee replacement to occur in January.  As knee is gotten progressively worse, he is basically sedentary.  He does not go to the gym anymore.  He used to do stationary biking but has discontinued that.  Diabetes is been out of control and surgery has been dependent upon getting A1c less than 7.5.  Most recent A1c is 7.4.  He denies chest pain and palpitations.  He has never been aware of increased heart rate.  He has noted bilateral lower extremity edem and over the past several months we have increased furosemide to 40 mg/day.  He still has mild bilateral lower extremity edema, left leg greater than right.  Left leg is a site of prior total knee replacement.  He has not had blood in his urine or stool.  Easy bruising on apixaban therapy.  Past Medical History:  Diagnosis Date  . Anxiety   . Arthritis   . Cancer (HCC)    skin - basil cell  . Depression   . Diabetes mellitus without complication ([place])   . Dysrhythmia    a-fib  . GERD (gastroesophageal reflux disease)   . Hyperlipidemia   . Hypertension   . Neuropathy   . Obesity   . Paroxysmal atrial fibrillation (HCC)   . Peripheral vascular disease ([place])    diabetic neuropathy in both feet  . Sleep apnea    uses C-pap machine  . Stroke [facility])     Past Surgical History:  Procedure Laterality Date  . APPENDECTOMY  1962  . BACK SURGERY   00-02-12   x3  . BASAL CELL CARCINOMA EXCISION  93/06/10  . COLONOSCOPY    . KNEE ARTHROSCOPY  005/01/02  . 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   Procedure: TOTAL LEFT KNEE ARTHROPLASTY;  Surgeon: [name], MD;  Location: [facility];  Service: Orthopedics;  Laterality: Left;    Current Medications: Current Meds  Medication Sig  . ALPRAZolam (XANAX) 0.5 MG tablet Take 0.5 mg by mouth 2 (two) times daily.   [name] Kitchen apixaban (ELIQUIS) 5 MG TABS tablet Take 1 tablet (5 mg total) by mouth 2 (two) times daily.  [name] Kitchen atorvastatin (LIPITOR) 80 MG tablet Take 1 tablet (80 mg total) by mouth daily at 6 PM.  . benazepril (LOTENSIN) 40 MG tablet Take 1 tablet (40 mg total) by mouth daily.  . ergocalciferol (VITAMIN D2) 50000 units capsule Take 50,000 Units by mouth 2 (two) times a week.  . furosemide (LASIX) 40 MG tablet Take 1 tablet (40 mg total) by mouth daily.  [name] Kitchen gabapentin (NEURONTIN) 300 MG capsule Take 2 capsules (600 mg total) by mouth at bedtime.  . hydroxyurea (HYDREA) 500 MG capsule Take 2 capsules (1,000 mg total) by mouth daily. May take with food to minimize GI side effects.  . insulin NPH-regular Human (NOVOLIN 70/30) (70-30) 100 UNIT/ML injection  Inject 44 Units into the skin 2 (two) times daily with a meal.  . metFORMIN (GLUCOPHAGE) 500 MG tablet Take 250 mg by mouth 2 (two) times daily with a meal.  . Multiple Vitamins-Minerals (MULTIVITAMIN ADULT PO) Take 1 tablet by mouth daily.  [name] Kitchen omeprazole (PRILOSEC) 20 MG capsule Take 20 mg by mouth daily.  [name] Kitchen venlafaxine XR (EFFEXOR-XR) 75 MG 24 hr capsule Take 75 mg by mouth daily.     Allergies:   Patient has no known allergies.   Social History   Socioeconomic History  . Marital status: Married    Spouse name: Not on file  . Number of children: Not on file  . Years of education: Not on file  . Highest education level: Not on file  Occupational History  . Not on file  Social Needs  . Financial resource strain: Not on file  . Food  insecurity:    Worry: Not on file    Inability: Not on file  . Transportation needs:    Medical: Not on file    Non-medical: Not on file  Tobacco Use  . Smoking status: Former Smoker    Types: Cigars    Last attempt to quit: 08/02/1992    Years since quitting: 26.0  . Smokeless tobacco: Never Used  Substance and Sexual Activity  . Alcohol use: Yes    Alcohol/week: 0.0 standard drinks    Comment: 1 beer a night.   . Drug use: No  . Sexual activity: Not on file  Lifestyle  . Physical activity:    Days per week: Not on file    Minutes per session: Not on file  . Stress: Not on file  Relationships  . Social connections:    Talks on phone: Not on file    Gets together: Not on file    Attends religious service: Not on file    Active member of club or organization: Not on file    Attends meetings of clubs or organizations: Not on file    Relationship status: Not on file  Other Topics Concern  . Not on file  Social History Narrative  . Not on file     Family History: The patient's family history includes CVA in his father; Cancer in his mother; Diabetes Mellitus II in his mother; Heart failure in his father; Hypertension in his mother and sister. There is no history of Colon cancer.  ROS:   Please see the history of present illness.    Easy bruising, sedentary lifestyle, left greater than right lower extremity swelling.  Significant discomfort in her right knee.  Prior left knee replacement surgery.  Looking forward to January 2020 right knee replacement.  All other systems reviewed and are negative.  EKGs/Labs/Other Studies Reviewed:    The following studies were reviewed today: Doppler echocardiogram November 2018: ------------------------------------------------------------------- Study Conclusions  - Left ventricle: The cavity size was normal. Wall thickness was   increased in a pattern of mild LVH. Systolic function was normal.   The estimated ejection fraction was  in the range of 55% to 60%.  EKG:  EKG is  ordered today.  The ekg ordered today demonstrates fibrillation with moderate increase in ventricular response at rest, 95 bpm.  Otherwise no significant abnormality showing only nonspecific ST-T wave change.  Recent Labs: 05/30/2018: NT-Pro BNP 382 07/18/2018: ALT 28; BUN 13; Creatinine, Ser 0.96; Hemoglobin 13.0; Platelets 624; Potassium 4.8; Sodium 138  Recent Lipid Panel No results found for: CHOL, TRIG, HDL,  CHOLHDL, VLDL, LDLCALC, LDLDIRECT  Physical Exam:    VS:  BP (!) 142/86   Pulse 95   Ht 6' (1.829 m)   Wt 284 lb 12.8 oz (129.2 kg)   SpO2 96%   BMI 38.63 kg/m     Wt Readings from Last 3 Encounters:  07/30/18 284 lb 12.8 oz (129.2 kg)  07/18/18 282 lb 9.6 oz (128.2 kg)  04/17/18 284 lb (128.8 kg)     GEN: Obese.  Well nourished, well developed in no acute distress HEENT: Normal NECK: No JVD. LYMPHATICS: No lymphadenopathy CARDIAC: IIRR, no murmur, no gallop, no  edema. VASCULAR: Plus bilateral radial and posterior tibial pulses.  No bruits. RESPIRATORY:  Clear to auscultation without rales, wheezing or rhonchi  ABDOMEN: Soft, non-tender, non-distended, No pulsatile mass, MUSCULOSKELETAL: No deformity  SKIN: Warm and dry NEUROLOGIC:  Alert and oriented x 3 PSYCHIATRIC:  Normal affect   ASSESSMENT:    1. Paroxysmal atrial fibrillation (HCC)   2. Tachycardia-bradycardia syndrome ([place])   3. Encounter for pre-operative cardiovascular clearance   4. Essential hypertension   5. Chronic diastolic CHF (congestive heart failure) ([place])   6. Chronic anticoagulation   7. Essential thrombocytosis (HCC)   8. Diabetes mellitus type 2, insulin dependent (HCC)    PLAN:    In order of problems listed above:  1. May now be in continuous atrial fibrillation.  In A. fib heart rate is relatively fast.  24-hour monitor will be done to determine if the patient is in continuous AF or still PAF. 2. When in sinus rhythm the patient has had  a relative bradycardia and therefore is not on AV nodal slowing agents.  If he is now in continuous atrial fibrillation, we will need to institute low-dose beta-blocker therapy to provide some protection against tachycardia induced cardiomyopathy. 3. A myocardial perfusion study will be done to rule out high risk underlying CAD to clear patient for upcoming surgery. 4. Blood pressures a little high.  AV nodal slowing agents would also be able to help with blood pressure if needed. 5. Bilateral lower extremity edema requiring the addition of a higher dose of loop diuretic.  Suspect this is likely related to chronic A. fib development rather than PAF. 6. No bleeding on apixaban. 7. Not addressed 8. A1c is 7.4 most recently.   Plan myocardial perfusion imaging to rule out high risk subset prior to knee replacement surgery and rehab.  24-hour Holter monitor to determine if the patient is in continuous atrial fibrillation, and if so, ensure that rate control is adequate.  Clinical follow-up in 8 to 12 months post surgery.  Further management decisions will depend upon findings from the above 2 studies.   Medication Adjustments/Labs and Tests Ordered: Current medicines are reviewed at length with the patient today.  Concerns regarding medicines are outlined above.  Orders Placed This Encounter  Procedures  . HOLTER MONITOR - 24 HOUR  . MYOCARDIAL PERFUSION IMAGING  . EKG 12-Lead   No orders of the defined types were placed in this encounter.   Patient Instructions  Medication Instructions:  No changes If you need a refill on your cardiac medications before your next appointment, please call your pharmacy.   Lab work: none If you have labs (blood work) drawn today and your tests are completely normal, you will receive your results only by: [name] Kitchen MyChart Message (if you have MyChart) OR . A paper copy in the mail If you have any lab test that is abnormal  or we need to change your treatment,  we will call you to review the results.  Testing/Procedures: Your physician has recommended that you wear a holter monitor. Holter monitors are medical devices that record the heart's electrical activity. Doctors most often use these monitors to diagnose arrhythmias. Arrhythmias are problems with the speed or rhythm of the heartbeat. The monitor is a small, portable device. You can wear one while you do your normal daily activities. This is usually used to diagnose what is causing palpitations/syncope (passing out).  Your physician has requested that you have a lexiscan myoview. For further information please visit [email or web address]. Please follow instruction sheet, as given.   Follow-Up: At [facility], you and your health needs are our priority.  As part of our continuing mission to provide you with exceptional heart care, we have created designated Provider Care Teams.  These Care Teams include your primary Cardiologist (physician) and Advanced Practice Providers (APPs -  Physician Assistants and Nurse Practitioners) who all work together to provide you with the care you need, when you need it. You will need a follow up appointment in 8-12 months.  Please call our office 2 months in advance to schedule this appointment.  You may see [name], MD or one of the following Advanced Practice Providers on your designated Care Team:   Truitt Merle, NP Cecilie Kicks, NP . Kathyrn Drown, NP  Any Other Special Instructions Will Be Listed Below (If Applicable).       Signed, [name], MD  07/30/2018 11:21 AM    [place]

## 2018-07-30 ENCOUNTER — Encounter: Payer: Self-pay | Admitting: *Deleted

## 2018-07-30 ENCOUNTER — Ambulatory Visit: Payer: Medicare HMO | Admitting: Interventional Cardiology

## 2018-07-30 ENCOUNTER — Encounter: Payer: Self-pay | Admitting: Interventional Cardiology

## 2018-07-30 ENCOUNTER — Encounter

## 2018-07-30 VITALS — BP 142/86 | HR 95 | Ht 72.0 in | Wt 284.8 lb

## 2018-07-30 DIAGNOSIS — Z01810 Encounter for preprocedural cardiovascular examination: Secondary | ICD-10-CM

## 2018-07-30 DIAGNOSIS — E119 Type 2 diabetes mellitus without complications: Secondary | ICD-10-CM

## 2018-07-30 DIAGNOSIS — D473 Essential (hemorrhagic) thrombocythemia: Secondary | ICD-10-CM

## 2018-07-30 DIAGNOSIS — Z7901 Long term (current) use of anticoagulants: Secondary | ICD-10-CM | POA: Diagnosis not present

## 2018-07-30 DIAGNOSIS — I5032 Chronic diastolic (congestive) heart failure: Secondary | ICD-10-CM

## 2018-07-30 DIAGNOSIS — I495 Sick sinus syndrome: Secondary | ICD-10-CM

## 2018-07-30 DIAGNOSIS — I10 Essential (primary) hypertension: Secondary | ICD-10-CM

## 2018-07-30 DIAGNOSIS — I480 Paroxysmal atrial fibrillation: Secondary | ICD-10-CM | POA: Diagnosis not present

## 2018-07-30 DIAGNOSIS — Z794 Long term (current) use of insulin: Secondary | ICD-10-CM

## 2018-07-30 NOTE — Patient Instructions (Signed)
Medication Instructions:  No changes If you need a refill on your cardiac medications before your next appointment, please call your pharmacy.   Lab work: none If you have labs (blood work) drawn today and your tests are completely normal, you will receive your results only by: [name] Kitchen MyChart Message (if you have MyChart) OR . A paper copy in the mail If you have any lab test that is abnormal or we need to change your treatment, we will call you to review the results.  Testing/Procedures: Your physician has recommended that you wear a holter monitor. Holter monitors are medical devices that record the heart's electrical activity. Doctors most often use these monitors to diagnose arrhythmias. Arrhythmias are problems with the speed or rhythm of the heartbeat. The monitor is a small, portable device. You can wear one while you do your normal daily activities. This is usually used to diagnose what is causing palpitations/syncope (passing out).  Your physician has requested that you have a lexiscan myoview. For further information please visit [email or web address]. Please follow instruction sheet, as given.   Follow-Up: At [facility], you and your health needs are our priority.  As part of our continuing mission to provide you with exceptional heart care, we have created designated Provider Care Teams.  These Care Teams include your primary Cardiologist (physician) and Advanced Practice Providers (APPs -  Physician Assistants and Nurse Practitioners) who all work together to provide you with the care you need, when you need it. You will need a follow up appointment in 8-12 months.  Please call our office 2 months in advance to schedule this appointment.  You may see [name], MD or one of the following Advanced Practice Providers on your designated Care Team:   Truitt Merle, NP Cecilie Kicks, NP . Kathyrn Drown, NP  Any Other Special Instructions Will Be Listed Below (If  Applicable).

## 2018-08-20 ENCOUNTER — Telehealth (HOSPITAL_COMMUNITY): Payer: Self-pay | Admitting: *Deleted

## 2018-08-20 NOTE — Telephone Encounter (Signed)
Patient given detailed instructions per Myocardial Perfusion Study Information Sheet for the test on 08/22/18 at 1045. Patient notified to arrive 15 minutes early and that it is imperative to arrive on time for appointment to keep from having the test rescheduled.  If you need to cancel or reschedule your appointment, please call the office within 24 hours of your appointment. . Patient verbalized understanding.Osvaldo Lamping, Ranae Palms

## 2018-08-22 ENCOUNTER — Ambulatory Visit (HOSPITAL_COMMUNITY): Payer: Medicare HMO | Attending: Cardiology

## 2018-08-22 ENCOUNTER — Ambulatory Visit (INDEPENDENT_AMBULATORY_CARE_PROVIDER_SITE_OTHER): Payer: Medicare HMO

## 2018-08-22 VITALS — Ht 72.0 in | Wt 284.0 lb

## 2018-08-22 DIAGNOSIS — I10 Essential (primary) hypertension: Secondary | ICD-10-CM | POA: Diagnosis not present

## 2018-08-22 DIAGNOSIS — Z7901 Long term (current) use of anticoagulants: Secondary | ICD-10-CM | POA: Diagnosis not present

## 2018-08-22 DIAGNOSIS — I5032 Chronic diastolic (congestive) heart failure: Secondary | ICD-10-CM | POA: Diagnosis not present

## 2018-08-22 DIAGNOSIS — Z01818 Encounter for other preprocedural examination: Secondary | ICD-10-CM | POA: Diagnosis present

## 2018-08-22 DIAGNOSIS — E119 Type 2 diabetes mellitus without complications: Secondary | ICD-10-CM

## 2018-08-22 DIAGNOSIS — Z794 Long term (current) use of insulin: Secondary | ICD-10-CM

## 2018-08-22 DIAGNOSIS — Z01810 Encounter for preprocedural cardiovascular examination: Secondary | ICD-10-CM

## 2018-08-22 DIAGNOSIS — I495 Sick sinus syndrome: Secondary | ICD-10-CM

## 2018-08-22 DIAGNOSIS — I480 Paroxysmal atrial fibrillation: Secondary | ICD-10-CM

## 2018-08-22 DIAGNOSIS — D473 Essential (hemorrhagic) thrombocythemia: Secondary | ICD-10-CM

## 2018-08-22 MED ORDER — REGADENOSON 0.4 MG/5ML IV SOLN
0.4000 mg | Freq: Once | INTRAVENOUS | Status: AC
  Administered 2018-08-22: 0.4 mg via INTRAVENOUS

## 2018-08-22 MED ORDER — TECHNETIUM TC 99M TETROFOSMIN IV KIT
31.5000 | PACK | Freq: Once | INTRAVENOUS | Status: AC | PRN
  Administered 2018-08-22: 31.5 via INTRAVENOUS
  Filled 2018-08-22: qty 32

## 2018-08-23 ENCOUNTER — Ambulatory Visit (HOSPITAL_COMMUNITY): Payer: Medicare HMO | Attending: Internal Medicine

## 2018-08-23 LAB — MYOCARDIAL PERFUSION IMAGING
CHL CUP NUCLEAR SRS: 0
LV dias vol: 124 mL (ref 62–150)
LVSYSVOL: 54 mL
NUC STRESS TID: 0.95
Peak HR: 75 {beats}/min
Rest HR: 61 {beats}/min
SDS: 0
SSS: 0

## 2018-08-23 MED ORDER — TECHNETIUM TC 99M TETROFOSMIN IV KIT
32.1000 | PACK | Freq: Once | INTRAVENOUS | Status: AC | PRN
  Administered 2018-08-23: 32.1 via INTRAVENOUS
  Filled 2018-08-23: qty 33

## 2018-08-24 DIAGNOSIS — M25561 Pain in right knee: Secondary | ICD-10-CM | POA: Diagnosis not present

## 2018-09-19 DIAGNOSIS — E1140 Type 2 diabetes mellitus with diabetic neuropathy, unspecified: Secondary | ICD-10-CM | POA: Diagnosis not present

## 2018-09-26 ENCOUNTER — Encounter: Payer: Self-pay | Admitting: Oncology

## 2018-09-26 ENCOUNTER — Inpatient Hospital Stay: Payer: Medicare HMO | Attending: Oncology

## 2018-09-26 DIAGNOSIS — Z794 Long term (current) use of insulin: Secondary | ICD-10-CM

## 2018-09-26 DIAGNOSIS — I5032 Chronic diastolic (congestive) heart failure: Secondary | ICD-10-CM

## 2018-09-26 DIAGNOSIS — R918 Other nonspecific abnormal finding of lung field: Secondary | ICD-10-CM | POA: Insufficient documentation

## 2018-09-26 DIAGNOSIS — E08311 Diabetes mellitus due to underlying condition with unspecified diabetic retinopathy with macular edema: Secondary | ICD-10-CM

## 2018-09-26 DIAGNOSIS — E0842 Diabetes mellitus due to underlying condition with diabetic polyneuropathy: Secondary | ICD-10-CM

## 2018-09-26 DIAGNOSIS — Z79899 Other long term (current) drug therapy: Secondary | ICD-10-CM | POA: Insufficient documentation

## 2018-09-26 DIAGNOSIS — I63512 Cerebral infarction due to unspecified occlusion or stenosis of left middle cerebral artery: Secondary | ICD-10-CM

## 2018-09-26 DIAGNOSIS — I495 Sick sinus syndrome: Secondary | ICD-10-CM

## 2018-09-26 DIAGNOSIS — D473 Essential (hemorrhagic) thrombocythemia: Secondary | ICD-10-CM | POA: Diagnosis not present

## 2018-09-26 DIAGNOSIS — I480 Paroxysmal atrial fibrillation: Secondary | ICD-10-CM

## 2018-09-26 DIAGNOSIS — E119 Type 2 diabetes mellitus without complications: Secondary | ICD-10-CM

## 2018-09-26 DIAGNOSIS — D689 Coagulation defect, unspecified: Secondary | ICD-10-CM

## 2018-09-26 LAB — CBC WITH DIFFERENTIAL/PLATELET
Abs Immature Granulocytes: 0.03 10*3/uL (ref 0.00–0.07)
Basophils Absolute: 0.1 10*3/uL (ref 0.0–0.1)
Basophils Relative: 2 %
EOS ABS: 0.1 10*3/uL (ref 0.0–0.5)
EOS PCT: 2 %
HEMATOCRIT: 38.6 % — AB (ref 39.0–52.0)
Hemoglobin: 13 g/dL (ref 13.0–17.0)
IMMATURE GRANULOCYTES: 1 %
LYMPHS ABS: 1.3 10*3/uL (ref 0.7–4.0)
Lymphocytes Relative: 20 %
MCH: 34 pg (ref 26.0–34.0)
MCHC: 33.7 g/dL (ref 30.0–36.0)
MCV: 101 fL — AB (ref 80.0–100.0)
MONOS PCT: 8 %
Monocytes Absolute: 0.5 10*3/uL (ref 0.1–1.0)
NEUTROS PCT: 67 %
Neutro Abs: 4.3 10*3/uL (ref 1.7–7.7)
Platelets: 608 10*3/uL — ABNORMAL HIGH (ref 150–400)
RBC: 3.82 MIL/uL — ABNORMAL LOW (ref 4.22–5.81)
RDW: 15.9 % — AB (ref 11.5–15.5)
WBC: 6.4 10*3/uL (ref 4.0–10.5)
nRBC: 0 % (ref 0.0–0.2)

## 2018-09-26 LAB — CMP (CANCER CENTER ONLY)
ALT: 29 U/L (ref 0–44)
ANION GAP: 5 (ref 5–15)
AST: 19 U/L (ref 15–41)
Albumin: 3.5 g/dL (ref 3.5–5.0)
Alkaline Phosphatase: 105 U/L (ref 38–126)
BILIRUBIN TOTAL: 1 mg/dL (ref 0.3–1.2)
BUN: 14 mg/dL (ref 8–23)
CHLORIDE: 105 mmol/L (ref 98–111)
CO2: 28 mmol/L (ref 22–32)
Calcium: 8.9 mg/dL (ref 8.9–10.3)
Creatinine: 0.82 mg/dL (ref 0.61–1.24)
GFR, Est AFR Am: >60 mL/min (ref >60)
GFR, Estimated: >60 mL/min (ref >60)
Glucose, Bld: 272 mg/dL — ABNORMAL HIGH (ref 70–99)
POTASSIUM: 4.5 mmol/L (ref 3.5–5.1)
Sodium: 138 mmol/L (ref 135–145)
TOTAL PROTEIN: 6.6 g/dL (ref 6.5–8.1)

## 2018-09-26 NOTE — H&P (Signed)
TOTAL KNEE ADMISSION H&P  Patient is being admitted for right total knee arthroplasty.  Subjective:  Chief Complaint:right knee pain.  HPI: [name], 78 y.o. male, has a history of pain and functional disability in the right knee due to arthritis and has failed non-surgical conservative treatments for greater than 12 weeks to includecorticosteriod injections, viscosupplementation injections and activity modification.  Onset of symptoms was gradual, starting several years ago with gradually worsening course since that time. The patient noted prior procedures on the knee to include  arthroscopy on the right knee(s).  Patient currently rates pain in the right knee(s) at 2 out of 10 with activity. Patient has worsening of pain with activity and weight bearing, crepitus and instability.  Patient has evidence of bone-on-bone arthritis in the medial and patellofemoral compartments with slight varus deformity by imaging studies. There is no active infection.  Patient Active Problem List   Diagnosis Date Noted  . 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 Diabetic retinopathy ([place]) 10/06/2017  . Diabetic polyneuropathy ([place]) 10/06/2017  . Morbid obesity ([place]) 09/25/2017  . Essential thrombocytosis ([place]) 09/25/2017  . Coagulopathy ([place]) 09/25/2017  . 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 HLD (hyperlipidemia) 08/09/2017  . Anxiety 08/09/2017  . Chronic diastolic CHF (congestive heart failure) ([place]) 08/09/2017  . OA (osteoarthritis) of knee 11/02/2015  . Hx of adenomatous colonic polyps 11/03/2014  . Paroxysmal atrial fibrillation ([place]) 11/03/2014  . Long term current use of anticoagulant therapy 11/03/2014  . Obesity (BMI 30-39.9) 07/15/2014  . HTN (hypertension) 07/15/2014  . Chronic anticoagulation 06/20/2014  . Tachycardia-bradycardia syndrome ([place]) 04/15/2014  . Obstructive sleep apnea 03/04/2014  . DUODENITIS WITHOUT MENTION OF HEMORRHAGE 06/24/2009  . Diabetes  mellitus type 2, insulin dependent ([place]) 05/22/2009  . GERD 05/22/2009   Past Medical History:  Diagnosis Date  . Anxiety   . Arthritis   . Cancer (HCC)    skin - basil cell  . Depression   . Diabetes mellitus without complication ([place])   . Dysrhythmia    a-fib  . GERD (gastroesophageal reflux disease)   . Hyperlipidemia   . Hypertension   . Neuropathy   . Obesity   . Paroxysmal atrial fibrillation (HCC)   . Peripheral vascular disease ([place])    diabetic neuropathy in both feet  . Sleep apnea    uses C-pap machine  . Stroke [facility]     Past Surgical History:  Procedure Laterality Date  . APPENDECTOMY  1962  . BACK SURGERY  00-02-12   x3  . BASAL CELL CARCINOMA EXCISION  93/06/10  . COLONOSCOPY    . KNEE ARTHROSCOPY  005/01/02  . 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   Procedure: TOTAL LEFT KNEE ARTHROPLASTY;  Surgeon: [name], MD;  Location: [facility];  Service: Orthopedics;  Laterality: Left;    No current facility-administered medications for this encounter.    Current Outpatient Medications  Medication Sig Dispense Refill Last Dose  . ALPRAZolam (XANAX) 0.5 MG tablet Take 0.5 mg by mouth 2 (two) times daily.    Taking  . apixaban (ELIQUIS) 5 MG TABS tablet Take 1 tablet (5 mg total) by mouth 2 (two) times daily. 180 tablet 1 Taking  . atorvastatin (LIPITOR) 80 MG tablet Take 1 tablet (80 mg total) by mouth daily at 6 PM. 30 tablet 0 Taking  . benazepril (LOTENSIN) 40 MG tablet Take 1 tablet (40 mg total) by mouth daily.   Taking  . ergocalciferol (VITAMIN  D2) 50000 units capsule Take 50,000 Units by mouth 2 (two) times a week.   Taking  . furosemide (LASIX) 40 MG tablet Take 1 tablet (40 mg total) by mouth daily. 90 tablet 3 Taking  . gabapentin (NEURONTIN) 300 MG capsule Take 2 capsules (600 mg total) by mouth at bedtime. 180 capsule 3 Taking  . hydroxyurea (HYDREA) 500 MG capsule Take 2 capsules (1,000 mg total) by mouth daily. May take with food to minimize GI  side effects. 180 capsule 12 Taking  . insulin NPH-regular Human (NOVOLIN 70/30) (70-30) 100 UNIT/ML injection Inject 44 Units into the skin 2 (two) times daily with a meal.   Taking  . metFORMIN (GLUCOPHAGE) 500 MG tablet Take 250 mg by mouth 2 (two) times daily with a meal.   Taking  . Multiple Vitamins-Minerals (MULTIVITAMIN ADULT PO) Take 1 tablet by mouth daily.   Taking  . omeprazole (PRILOSEC) 20 MG capsule Take 20 mg by mouth daily.   Taking  . venlafaxine XR (EFFEXOR-XR) 75 MG 24 hr capsule Take 75 mg by mouth daily.   Taking   No Known Allergies  Social History   Tobacco Use  . Smoking status: Former Smoker    Types: Cigars    Last attempt to quit: 08/02/1992    Years since quitting: 26.1  . Smokeless tobacco: Never Used  Substance Use Topics  . Alcohol use: Yes    Alcohol/week: 0.0 standard drinks    Comment: 1 beer a night.     Family History  Problem Relation Age of Onset  . Cancer Mother   . Diabetes Mellitus II Mother   . Hypertension Mother   . Heart failure Father   . CVA Father   . Hypertension Sister   . Colon cancer Neg Hx      Review of Systems  Constitutional: Negative for chills and fever.  HENT: Negative for congestion, sore throat and tinnitus.   Eyes: Negative for double vision, photophobia and pain.  Respiratory: Negative for cough, shortness of breath and wheezing.   Cardiovascular: Negative for chest pain, palpitations and orthopnea.  Gastrointestinal: Negative for heartburn, nausea and vomiting.  Genitourinary: Negative for dysuria, frequency and urgency.  Musculoskeletal: Positive for joint pain.  Neurological: Negative for dizziness, weakness and headaches.    Objective:  Physical Exam  Well nourished and well developed.  General: Alert and oriented x3, cooperative and pleasant, no acute distress.  Head: normocephalic, atraumatic, neck supple.  Eyes: EOMI.  Respiratory: breath sounds clear in all fields, no wheezing, rales, or  rhonchi. Cardiovascular: Regular rate and rhythm, no murmurs, gallops or rubs.  Abdomen: non-tender to palpation and soft, normoactive bowel sounds. Musculoskeletal: Right Knee Exam: No effusion. Slight varus deformity. Range of motion is 5-120 degrees. Marked crepitus on range of motion of the knee. Positive medial, greater than lateral, joint line tenderness. Stable knee. Calves soft and nontender. Motor function intact in LE. Strength 5/5 LE bilaterally. Neuro: Distal pulses 2+. Sensation to light touch intact in LE. Procedure Documentation   Vital signs in last 24 hours: Blood pressure: 138/80 mmHg Pulse: 96 bpm Labs:   Estimated body mass index is 38.52 kg/m as calculated from the following:   Height as of 08/22/18: 6' (1.829 m).   Weight as of 08/22/18: 128.8 kg.   Imaging Review Plain radiographs demonstrate severe degenerative joint disease of the right knee(s). The overall alignment isneutral. The bone quality appears to be adequate for age and reported activity level.  Preoperative templating of the joint replacement has been completed, documented, and submitted to the Operating Room personnel in order to optimize intra-operative equipment management.   Anticipated LOS equal to or greater than 2 midnights due to - Age 42 and older with one or more of the following:  - Obesity  - Expected need for hospital services (PT, OT, Nursing) required for safe  discharge  - Anticipated need for postoperative skilled nursing care or inpatient rehab  - Active co-morbidities: Diabetes, Stroke and Cardiac Arrhythmia OR   - Unanticipated findings during/Post Surgery: None  - Patient is a high risk of re-admission due to: None     Assessment/Plan:  End stage arthritis, right knee   The patient history, physical examination, clinical judgment of the provider and imaging studies are consistent with end stage degenerative joint disease of the right knee(s) and total knee  arthroplasty is deemed medically necessary. The treatment options including medical management, injection therapy arthroscopy and arthroplasty were discussed at length. The risks and benefits of total knee arthroplasty were presented and reviewed. The risks due to aseptic loosening, infection, stiffness, patella tracking problems, thromboembolic complications and other imponderables were discussed. The patient acknowledged the explanation, agreed to proceed with the plan and consent was signed. Patient is being admitted for inpatient treatment for surgery, pain control, PT, OT, prophylactic antibiotics, VTE prophylaxis, progressive ambulation and ADL's and discharge planning. The patient is planning to be discharged home.   Therapy Plans: HHPT then outpatient therapy at [facility] Disposition: Home with wife Planned DVT Prophylaxis: Eliquis 5 mg BID (pt takes currently for atrial fibrillation and hx stroke) DME needed: Gilford Rile PCP: [name], MD Cardiologist: Daneen Schick, MD TXA: IV Allergies: NKDA Anesthesia Concerns: Nausea, sleep apnea BMI: 38.2 Last HgbA1c: 7.5  - Patient was instructed on what medications to stop prior to surgery. - Follow-up visit in 2 weeks with Dr. Wynelle Link - Begin physical therapy following surgery - Pre-operative lab work as pre-surgical testing - Prescriptions will be provided in hospital at time of discharge  Theresa Duty, PA-C Orthopedic Surgery [facility] [place]

## 2018-10-02 NOTE — Patient Instructions (Addendum)
[name]  10/02/2018   Your procedure is scheduled on: 10-08-18    Report to [facility] Main  Entrance    Report to Admitting at 6:55 AM    Call this number if you have problems the morning of surgery [phone]    Remember: Do not eat food or drink liquids :After Midnight.      Take these medicines the morning of surgery with A SIP OF WATER: Alprazolam (Xanax), Omeprazole (Prilosec), and Venlafaxine XR (Effexor-XR) and  Gabapentin (Neurotin)    BRUSH YOUR TEETH MORNING OF SURGERY AND RINSE YOUR MOUTH OUT, NO CHEWING GUM CANDY OR MINTS.   DO NOT TAKE ANY DIABETIC MEDICATIONS DAY OF YOUR SURGERY                               You may not have any metal on your body including hair pins and              piercings  Do not wear jewelry, cologne, lotions, powders or deodorant             Men may shave face and neck.   Do not bring valuables to the hospital. [place].  Contacts, dentures or bridgework may not be worn into surgery.  Leave suitcase in the car. After surgery it may be brought to your room.     Patients discharged the day of surgery will not be allowed to drive home. IF YOU ARE HAVING SURGERY AND GOING HOME THE SAME DAY, YOU MUST HAVE AN ADULT TO DRIVE YOU HOME AND BE WITH YOU FOR 24 HOURS. YOU MAY GO HOME BY TAXI OR UBER OR ORTHERWISE, BUT AN ADULT MUST ACCOMPANY YOU HOME AND STAY WITH YOU FOR 24 HOURS.    Special Instructions: N/A              Please read over the following fact sheets you were given: _____________________________________________________________________             How to Manage Your Diabetes Before and After Surgery  Why is it important to control my blood sugar before and after surgery? . Improving blood sugar levels before and after surgery helps healing and can limit problems. . A way of improving blood sugar control is eating a healthy diet by: o  Eating less  sugar and carbohydrates o  Increasing activity/exercise o  Talking with your doctor about reaching your blood sugar goals . High blood sugars (greater than 180 mg/dL) can raise your risk of infections and slow your recovery, so you will need to focus on controlling your diabetes during the weeks before surgery. . Make sure that the doctor who takes care of your diabetes knows about your planned surgery including the date and location.  How do I manage my blood sugar before surgery? . Check your blood sugar at least 4 times a day, starting 2 days before surgery, to make sure that the level is not too high or low. o Check your blood sugar the morning of your surgery when you wake up and every 2 hours until you get to the Short Stay unit. . If your blood sugar is less than 70 mg/dL, you will need to treat for low blood  sugar: o Do not take insulin. o Treat a low blood sugar (less than 70 mg/dL) with  cup of clear juice (cranberry or apple), 4 glucose tablets, OR glucose gel. o Recheck blood sugar in 15 minutes after treatment (to make sure it is greater than 70 mg/dL). If your blood sugar is not greater than 70 mg/dL on recheck, call [phone] for further instructions. . Report your blood sugar to the short stay nurse when you get to Short Stay.  . If you are admitted to the hospital after surgery: o Your blood sugar will be checked by the staff and you will probably be given insulin after surgery (instead of oral diabetes medicines) to make sure you have good blood sugar levels. o The goal for blood sugar control after surgery is 80-180 mg/dL.   WHAT DO I DO ABOUT MY DIABETES MEDICATION?  [name] Kitchen Do not take oral diabetes medicines (pills) the morning of surgery.  . THE DAY BEFORE SURGERY, take your usual dose of Metformin. You can take your usual 44 U of Insulin Novolin 70/30 at breakfast. But only take 30 U of Novolin 70/30 at supper      Reviewed and Endorsed by [facility] Patient  Education Committee, August 2015    [facility] - Preparing for Surgery Before surgery, you can play an important role.  Because skin is not sterile, your skin needs to be as free of germs as possible.  You can reduce the number of germs on your skin by washing with CHG (chlorahexidine gluconate) soap before surgery.  CHG is an antiseptic cleaner which kills germs and bonds with the skin to continue killing germs even after washing. Please DO NOT use if you have an allergy to CHG or antibacterial soaps.  If your skin becomes reddened/irritated stop using the CHG and inform your nurse when you arrive at Short Stay. Do not shave (including legs and underarms) for at least 48 hours prior to the first CHG shower.  You may shave your face/neck. Please follow these instructions carefully:  1.  Shower with CHG Soap the night before surgery and the  morning of Surgery.  2.  If you choose to wash your hair, wash your hair first as usual with your  normal  shampoo.  3.  After you shampoo, rinse your hair and body thoroughly to remove the  shampoo.                           4.  Use CHG as you would any other liquid soap.  You can apply chg directly  to the skin and wash                       Gently with a scrungie or clean washcloth.  5.  Apply the CHG Soap to your body ONLY FROM THE NECK DOWN.   Do not use on face/ open                           Wound or open sores. Avoid contact with eyes, ears mouth and genitals (private parts).                       Wash face,  Genitals (private parts) with your normal soap.             6.  Wash thoroughly, paying special attention to  the area where your surgery  will be performed.  7.  Thoroughly rinse your body with warm water from the neck down.  8.  DO NOT shower/wash with your normal soap after using and rinsing off  the CHG Soap.                9.  Pat yourself dry with a clean towel.            10.  Wear clean pajamas.            11.  Place clean sheets on your  bed the night of your first shower and do not  sleep with pets. Day of Surgery : Do not apply any lotions/deodorants the morning of surgery.  Please wear clean clothes to the hospital/surgery center.  FAILURE TO FOLLOW THESE INSTRUCTIONS MAY RESULT IN THE CANCELLATION OF YOUR SURGERY PATIENT SIGNATURE_________________________________  NURSE SIGNATURE__________________________________  ________________________________________________________________________   Adam Phenix  An incentive spirometer is a tool that can help keep your lungs clear and active. This tool measures how well you are filling your lungs with each breath. Taking long deep breaths may help reverse or decrease the chance of developing breathing (pulmonary) problems (especially infection) following:  A long period of time when you are unable to move or be active. BEFORE THE PROCEDURE   If the spirometer includes an indicator to show your best effort, your nurse or respiratory therapist will set it to a desired goal.  If possible, sit up straight or lean slightly forward. Try not to slouch.  Hold the incentive spirometer in an upright position. INSTRUCTIONS FOR USE  1. Sit on the edge of your bed if possible, or sit up as far as you can in bed or on a chair. 2. Hold the incentive spirometer in an upright position. 3. Breathe out normally. 4. Place the mouthpiece in your mouth and seal your lips tightly around it. 5. Breathe in slowly and as deeply as possible, raising the piston or the ball toward the top of the column. 6. Hold your breath for 3-5 seconds or for as long as possible. Allow the piston or ball to fall to the bottom of the column. 7. Remove the mouthpiece from your mouth and breathe out normally. 8. Rest for a few seconds and repeat Steps 1 through 7 at least 10 times every 1-2 hours when you are awake. Take your time and take a few normal breaths between deep breaths. 9. The spirometer may include  an indicator to show your best effort. Use the indicator as a goal to work toward during each repetition. 10. After each set of 10 deep breaths, practice coughing to be sure your lungs are clear. If you have an incision (the cut made at the time of surgery), support your incision when coughing by placing a pillow or rolled up towels firmly against it. Once you are able to get out of bed, walk around indoors and cough well. You may stop using the incentive spirometer when instructed by your caregiver.  RISKS AND COMPLICATIONS  Take your time so you do not get dizzy or light-headed.  If you are in pain, you may need to take or ask for pain medication before doing incentive spirometry. It is harder to take a deep breath if you are having pain. AFTER USE  Rest and breathe slowly and easily.  It can be helpful to keep track of a log of your progress. Your caregiver can provide you with  a simple table to help with this. If you are using the spirometer at home, follow these instructions: [place] IF:   You are having difficultly using the spirometer.  You have trouble using the spirometer as often as instructed.  Your pain medication is not giving enough relief while using the spirometer.  You develop fever of 100.5 F (38.1 C) or higher. SEEK IMMEDIATE MEDICAL CARE IF:   You cough up bloody sputum that had not been present before.  You develop fever of 102 F (38.9 C) or greater.  You develop worsening pain at or near the incision site. MAKE SURE YOU:   Understand these instructions.  Will watch your condition.  Will get help right away if you are not doing well or get worse. Document Released: 01/09/2007 Document Revised: 11/21/2011 Document Reviewed: 03/12/2007 ExitCare Patient Information 2014 ExitCare, [place].   ________________________________________________________________________  WHAT IS A BLOOD TRANSFUSION? Blood Transfusion Information  A transfusion is the  replacement of blood or some of its parts. Blood is made up of multiple cells which provide different functions.  Red blood cells carry oxygen and are used for blood loss replacement.  White blood cells fight against infection.  Platelets control bleeding.  Plasma helps clot blood.  Other blood products are available for specialized needs, such as hemophilia or other clotting disorders. BEFORE THE TRANSFUSION  Who gives blood for transfusions?   Healthy volunteers who are fully evaluated to make sure their blood is safe. This is blood bank blood. Transfusion therapy is the safest it has ever been in the practice of medicine. Before blood is taken from a donor, a complete history is taken to make sure that person has no history of diseases nor engages in risky social behavior (examples are intravenous drug use or sexual activity with multiple partners). The donor's travel history is screened to minimize risk of transmitting infections, such as malaria. The donated blood is tested for signs of infectious diseases, such as HIV and hepatitis. The blood is then tested to be sure it is compatible with you in order to minimize the chance of a transfusion reaction. If you or a relative donates blood, this is often done in anticipation of surgery and is not appropriate for emergency situations. It takes many days to process the donated blood. RISKS AND COMPLICATIONS Although transfusion therapy is very safe and saves many lives, the main dangers of transfusion include:   Getting an infectious disease.  Developing a transfusion reaction. This is an allergic reaction to something in the blood you were given. Every precaution is taken to prevent this. The decision to have a blood transfusion has been considered carefully by your caregiver before blood is given. Blood is not given unless the benefits outweigh the risks. AFTER THE TRANSFUSION  Right after receiving a blood transfusion, you will usually  feel much better and more energetic. This is especially true if your red blood cells have gotten low (anemic). The transfusion raises the level of the red blood cells which carry oxygen, and this usually causes an energy increase.  The nurse administering the transfusion will monitor you carefully for complications. HOME CARE INSTRUCTIONS  No special instructions are needed after a transfusion. You may find your energy is better. Speak with your caregiver about any limitations on activity for underlying diseases you may have. SEEK MEDICAL CARE IF:   Your condition is not improving after your transfusion.  You develop redness or irritation at the intravenous (IV) site. SEEK  IMMEDIATE MEDICAL CARE IF:  Any of the following symptoms occur over the next 12 hours:  Shaking chills.  You have a temperature by mouth above 102 F (38.9 C), not controlled by medicine.  Chest, back, or muscle pain.  People around you feel you are not acting correctly or are confused.  Shortness of breath or difficulty breathing.  Dizziness and fainting.  You get a rash or develop hives.  You have a decrease in urine output.  Your urine turns a dark color or changes to pink, red, or brown. Any of the following symptoms occur over the next 10 days:  You have a temperature by mouth above 102 F (38.9 C), not controlled by medicine.  Shortness of breath.  Weakness after normal activity.  The white part of the eye turns yellow (jaundice).  You have a decrease in the amount of urine or are urinating less often.  Your urine turns a dark color or changes to pink, red, or brown. Document Released: 08/26/2000 Document Revised: 11/21/2011 Document Reviewed: 04/14/2008 [facility] Patient Information 2014 [place], [place].  _______________________________________________________________________

## 2018-10-02 NOTE — Progress Notes (Signed)
08-23-18 (Epic) Cardiac Clearance from Dr. Tamala Julian in Stress Test result  08-23-18 (Epic) Stress Test  07-30-18 (Epic) EKG  02-28-18 (Epic) CT chest  08-10-17 (Epic) ECHO

## 2018-10-03 ENCOUNTER — Other Ambulatory Visit: Payer: Self-pay

## 2018-10-03 ENCOUNTER — Encounter (HOSPITAL_COMMUNITY): Payer: Self-pay

## 2018-10-03 ENCOUNTER — Encounter (HOSPITAL_COMMUNITY)
Admission: RE | Admit: 2018-10-03 | Discharge: 2018-10-03 | Disposition: A | Discharge status: Home / Self Care | Payer: Medicare HMO | Source: Ambulatory Visit | Attending: Orthopedic Surgery | Admitting: Orthopedic Surgery

## 2018-10-03 DIAGNOSIS — Z01812 Encounter for preprocedural laboratory examination: Secondary | ICD-10-CM | POA: Insufficient documentation

## 2018-10-03 LAB — COMPREHENSIVE METABOLIC PANEL
ALK PHOS: 82 U/L (ref 38–126)
ALT: 32 U/L (ref 0–44)
ANION GAP: 8 (ref 5–15)
AST: 25 U/L (ref 15–41)
Albumin: 3.6 g/dL (ref 3.5–5.0)
BUN: 19 mg/dL (ref 8–23)
CO2: 26 mmol/L (ref 22–32)
Calcium: 8.9 mg/dL (ref 8.9–10.3)
Chloride: 104 mmol/L (ref 98–111)
Creatinine, Ser: 0.79 mg/dL (ref 0.61–1.24)
GFR calc Af Amer: >60 mL/min (ref >60)
GFR calc non Af Amer: >60 mL/min (ref >60)
GLUCOSE: 299 mg/dL — AB (ref 70–99)
Potassium: 4.5 mmol/L (ref 3.5–5.1)
Sodium: 138 mmol/L (ref 135–145)
Total Bilirubin: 1 mg/dL (ref 0.3–1.2)
Total Protein: 6.3 g/dL — ABNORMAL LOW (ref 6.5–8.1)

## 2018-10-03 LAB — CBC
HEMATOCRIT: 40.9 % (ref 39.0–52.0)
HEMOGLOBIN: 13.4 g/dL (ref 13.0–17.0)
MCH: 33.8 pg (ref 26.0–34.0)
MCHC: 32.8 g/dL (ref 30.0–36.0)
MCV: 103 fL — AB (ref 80.0–100.0)
Platelets: 690 10*3/uL — ABNORMAL HIGH (ref 150–400)
RBC: 3.97 MIL/uL — ABNORMAL LOW (ref 4.22–5.81)
RDW: 16 % — ABNORMAL HIGH (ref 11.5–15.5)
WBC: 7.8 10*3/uL (ref 4.0–10.5)
nRBC: 0.5 % — ABNORMAL HIGH (ref 0.0–0.2)

## 2018-10-03 LAB — APTT: aPTT: 38 seconds — ABNORMAL HIGH (ref 24–36)

## 2018-10-03 LAB — SURGICAL PCR SCREEN
MRSA, PCR: NEGATIVE
Staphylococcus aureus: POSITIVE — AB

## 2018-10-03 LAB — PROTIME-INR
INR: 1.23
Prothrombin Time: 15.4 seconds — ABNORMAL HIGH (ref 11.4–15.2)

## 2018-10-03 LAB — GLUCOSE, CAPILLARY: Glucose-Capillary: 273 mg/dL — ABNORMAL HIGH (ref 70–99)

## 2018-10-03 NOTE — Progress Notes (Signed)
10-03-18 PT/PTT routed to Dr. Wynelle Link office for review

## 2018-10-03 NOTE — Progress Notes (Signed)
PCP:[name]  CARDIOLOGIST: Dr. Tamala Julian  INFO IN Epic: EKG, Stress, ECHO, CT chest, Cardiac Clearance in 08-23-18 stress test  INFO ON CHART:  BLOOD THINNERS AND LAST DOSES: Eliquis. Pt plan to hold 3 days prior to procedure.  ____________________________________  PATIENT SYMPTOMS AT TIME OF PREOP: Elevated BG  Hx of HTN, A-fib, Stroke, CHF, DM , Sleep Apnea (Pt doesnot wear CPAP)

## 2018-10-04 NOTE — Anesthesia Preprocedure Evaluation (Addendum)
Anesthesia Evaluation  Patient identified by MRN, date of birth, ID band Patient awake    Reviewed: Allergy & Precautions, NPO status , Patient's Chart, lab work & pertinent test results  Airway Mallampati: I  TM Distance: >3 FB Neck ROM: Full    Dental   Pulmonary sleep apnea , former smoker,    Pulmonary exam normal        Cardiovascular hypertension, Pt. on medications Normal cardiovascular exam+ dysrhythmias Atrial Fibrillation      Neuro/Psych Anxiety Depression    GI/Hepatic GERD  Medicated and Controlled,  Endo/Other  diabetes, Type 2, Insulin Dependent  Renal/GU      Musculoskeletal   Abdominal   Peds  Hematology   Anesthesia Other Findings   Reproductive/Obstetrics                           Anesthesia Physical Anesthesia Plan  ASA: III  Anesthesia Plan: Spinal   Post-op Pain Management:  Regional for Post-op pain   Induction:   PONV Risk Score and Plan: 1 and Ondansetron  Airway Management Planned: Simple Face Mask  Additional Equipment:   Intra-op Plan:   Post-operative Plan:   Informed Consent: I have reviewed the patients History and Physical, chart, labs and discussed the procedure including the risks, benefits and alternatives for the proposed anesthesia with the patient or authorized representative who has indicated his/her understanding and acceptance.       Plan Discussed with: CRNA and Surgeon  Anesthesia Plan Comments: (See PST note 10/03/18, Konrad Felix, PA-C)      Anesthesia Quick Evaluation

## 2018-10-04 NOTE — Progress Notes (Addendum)
Anesthesia Chart Review   Case:  [id number] Date/Time:  10/08/18 0910   Procedure:  RIGHT TOTAL KNEE ARTHROPLASTY (Right Knee) - 57min   Anesthesia type:  Choice   Pre-op diagnosis:  right knee osteoarthritis   Location:  [facility] [facility] / [facility]S   Surgeon:  [name], MD      DISCUSSION:77 to former smoker (30 pack years, quit 08/02/1992) with h/o DM II, HLD, GERD, PVD, stroke (08/09/2017), HTN, A-fib (anticoagulated w/Eliquis), spee apnea (uses Cpap) scheduled for above surgery on 10/08/18 with Dr. [name].  Pt with thrombocytosis followed by Dr. Lurline Del with medical oncology.  He was last seen on 07/18/18, note reports he is on Hydrea 1g daily indefinitely with stable platelet count.  Platelets at PST on 10/03/18 690. Follow up in 1 year per Dr. Jana Hakim.   He was last seen by cardiologist on 07/30/18, [name] who recommended stress test prior to giving clearance for upcoming surgery.  Pt underwent stress test 08/23/18.  Per report notes by [name], on 08/27/18 which states, "Let the patient know he is cleared for TKR surgery. Study is low risk." Advised to discontinue Eliquis 3 days prior to surgery.    Elevated glucose at PST 10/03/18 299, reports last A1C 7.5 on 09/19/18 which was confirmed with Dr. Baldwin Crown office. OV note requested, have not received yet.   Pt can proceed with planned procedure barring acute status change.  VS: BP 116/65   Pulse 65   Temp 36.9 C (Oral)   Resp 20   Ht 6' (1.829 m)   Wt 127.1 kg   SpO2 97%   BMI 38.00 kg/m   PROVIDERS: [name], MD is PCP  Daneen Schick, MD is Cardiologist   Magrinat, Sarajane Jews, MD with Medical Oncology  LABS: Elevated Glucose, will repeat DOS.  Last A1C 7.5 on 09/19/18 (all labs ordered are listed, but only abnormal results are displayed)  Labs Reviewed  SURGICAL PCR SCREEN - Abnormal; Notable for the following components:      Result Value   Staphylococcus aureus POSITIVE (*)    All other  components within normal limits  APTT - Abnormal; Notable for the following components:   aPTT 38 (*)    All other components within normal limits  CBC - Abnormal; Notable for the following components:   RBC 3.97 (*)    MCV 103.0 (*)    RDW 16.0 (*)    Platelets 690 (*)    nRBC 0.5 (*)    All other components within normal limits  COMPREHENSIVE METABOLIC PANEL - Abnormal; Notable for the following components:   Glucose, Bld 299 (*)    Total Protein 6.3 (*)    All other components within normal limits  PROTIME-INR - Abnormal; Notable for the following components:   Prothrombin Time 15.4 (*)    All other components within normal limits  GLUCOSE, CAPILLARY - Abnormal; Notable for the following components:   Glucose-Capillary 273 (*)    All other components within normal limits  TYPE AND SCREEN     IMAGES: CT Chest 02/28/18 IMPRESSION: Stable 7 mm right upper lobe pulmonary nodule. Other smaller sub-cm right lung nodules are also stable. Recommend continued follow-up by chest CT without contrast in 12 months. This recommendation follows the consensus statement: Guidelines for Management of Small Pulmonary Nodules Detected on CT Images: From the Fleischner Society 2017; Radiology 2017; 284:228-243. Aortic Atherosclerosis (ICD10-I70.0). Coronary artery calcification.  EKG: 07/30/18 Rate 95bpm Atrial fibrillation  Nonspecific ST abnormality  Abnormal ECG  CV: Echo 08/10/17 Study Conclusions  - Left ventricle: The cavity size was normal. Wall thickness was   increased in a pattern of mild LVH. Systolic function was normal.   The estimated ejection fraction was in the range of 55% to 60%.  Stress Test 08/23/18  The left ventricular ejection fraction is normal (55-65%).  Nuclear stress EF: 56%.  There was no ST segment deviation noted during stress.  The study is normal.  This is a low risk study. Past Medical History:  Diagnosis Date  . Anxiety   . Arthritis    . Cancer (HCC)    skin - basil cell  . Depression   . Diabetes mellitus without complication ([place])   . Dysrhythmia    a-fib  . GERD (gastroesophageal reflux disease)   . Hyperlipidemia   . Hypertension   . Neuropathy   . Obesity   . Paroxysmal atrial fibrillation (HCC)   . Peripheral vascular disease ([place])    diabetic neuropathy in both feet  . Sleep apnea    uses C-pap machine  . Stroke [facility])    08/09/2017    Past Surgical History:  Procedure Laterality Date  . APPENDECTOMY  1962  . BACK SURGERY  00-02-12   x3  . BASAL CELL CARCINOMA EXCISION  93/06/10  . COLONOSCOPY    . KNEE ARTHROSCOPY  005/01/02  . 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   Procedure: TOTAL LEFT KNEE ARTHROPLASTY;  Surgeon: [name], MD;  Location: [facility];  Service: Orthopedics;  Laterality: Left;    MEDICATIONS: . ALPRAZolam (XANAX) 0.5 MG tablet  . apixaban (ELIQUIS) 5 MG TABS tablet  . atorvastatin (LIPITOR) 80 MG tablet  . benazepril (LOTENSIN) 40 MG tablet  . ergocalciferol (VITAMIN D2) 50000 units capsule  . furosemide (LASIX) 40 MG tablet  . gabapentin (NEURONTIN) 300 MG capsule  . gabapentin (NEURONTIN) 600 MG tablet  . hydroxyurea (HYDREA) 500 MG capsule  . insulin NPH-regular Human (NOVOLIN 70/30) (70-30) 100 UNIT/ML injection  . metFORMIN (GLUCOPHAGE) 1000 MG tablet  . Multiple Vitamin (MULTIVITAMIN WITH MINERALS) TABS tablet  . omeprazole (PRILOSEC) 20 MG capsule  . venlafaxine XR (EFFEXOR-XR) 75 MG 24 hr capsule   No current facility-administered medications for this encounter.    Maia Plan [facility] Pre-Surgical Testing [phone] 10/04/18 10:53 AM

## 2018-10-07 MED ORDER — DEXTROSE 5 % IV SOLN
3.0000 g | INTRAVENOUS | Status: AC
  Administered 2018-10-08: 3 g via INTRAVENOUS
  Filled 2018-10-07: qty 3

## 2018-10-07 MED ORDER — BUPIVACAINE LIPOSOME 1.3 % IJ SUSP
20.0000 mL | Freq: Once | INTRAMUSCULAR | Status: AC
  Filled 2018-10-07: qty 20

## 2018-10-08 ENCOUNTER — Ambulatory Visit (HOSPITAL_COMMUNITY): Payer: Medicare HMO | Admitting: Physician Assistant

## 2018-10-08 ENCOUNTER — Other Ambulatory Visit: Payer: Self-pay

## 2018-10-08 ENCOUNTER — Observation Stay (HOSPITAL_COMMUNITY)
Admission: RE | Admit: 2018-10-08 | Discharge: 2018-10-11 | Disposition: A | Discharge status: Home / Self Care | Payer: Medicare HMO | Attending: Orthopedic Surgery | Admitting: Orthopedic Surgery

## 2018-10-08 ENCOUNTER — Encounter (HOSPITAL_COMMUNITY): Payer: Self-pay | Admitting: *Deleted

## 2018-10-08 ENCOUNTER — Encounter (HOSPITAL_COMMUNITY)
Admission: RE | Disposition: A | Discharge status: Home / Self Care | Payer: Self-pay | Source: Home / Self Care | Attending: Orthopedic Surgery

## 2018-10-08 ENCOUNTER — Ambulatory Visit (HOSPITAL_COMMUNITY): Payer: Medicare HMO | Admitting: Anesthesiology

## 2018-10-08 DIAGNOSIS — K219 Gastro-esophageal reflux disease without esophagitis: Secondary | ICD-10-CM | POA: Insufficient documentation

## 2018-10-08 DIAGNOSIS — Z85828 Personal history of other malignant neoplasm of skin: Secondary | ICD-10-CM | POA: Diagnosis not present

## 2018-10-08 DIAGNOSIS — M1710 Unilateral primary osteoarthritis, unspecified knee: Secondary | ICD-10-CM | POA: Diagnosis present

## 2018-10-08 DIAGNOSIS — F419 Anxiety disorder, unspecified: Secondary | ICD-10-CM | POA: Diagnosis not present

## 2018-10-08 DIAGNOSIS — G4733 Obstructive sleep apnea (adult) (pediatric): Secondary | ICD-10-CM | POA: Insufficient documentation

## 2018-10-08 DIAGNOSIS — E11319 Type 2 diabetes mellitus with unspecified diabetic retinopathy without macular edema: Secondary | ICD-10-CM | POA: Insufficient documentation

## 2018-10-08 DIAGNOSIS — Z79899 Other long term (current) drug therapy: Secondary | ICD-10-CM | POA: Diagnosis not present

## 2018-10-08 DIAGNOSIS — Z794 Long term (current) use of insulin: Secondary | ICD-10-CM | POA: Diagnosis not present

## 2018-10-08 DIAGNOSIS — I10 Essential (primary) hypertension: Secondary | ICD-10-CM | POA: Diagnosis not present

## 2018-10-08 DIAGNOSIS — E669 Obesity, unspecified: Secondary | ICD-10-CM | POA: Insufficient documentation

## 2018-10-08 DIAGNOSIS — M1711 Unilateral primary osteoarthritis, right knee: Principal | ICD-10-CM | POA: Insufficient documentation

## 2018-10-08 DIAGNOSIS — R69 Illness, unspecified: Secondary | ICD-10-CM | POA: Diagnosis not present

## 2018-10-08 DIAGNOSIS — E1151 Type 2 diabetes mellitus with diabetic peripheral angiopathy without gangrene: Secondary | ICD-10-CM | POA: Insufficient documentation

## 2018-10-08 DIAGNOSIS — E785 Hyperlipidemia, unspecified: Secondary | ICD-10-CM | POA: Diagnosis not present

## 2018-10-08 DIAGNOSIS — I5032 Chronic diastolic (congestive) heart failure: Secondary | ICD-10-CM | POA: Diagnosis not present

## 2018-10-08 DIAGNOSIS — F329 Major depressive disorder, single episode, unspecified: Secondary | ICD-10-CM | POA: Insufficient documentation

## 2018-10-08 DIAGNOSIS — Z6837 Body mass index (BMI) 37.0-37.9, adult: Secondary | ICD-10-CM | POA: Diagnosis not present

## 2018-10-08 DIAGNOSIS — Z8673 Personal history of transient ischemic attack (TIA), and cerebral infarction without residual deficits: Secondary | ICD-10-CM | POA: Diagnosis not present

## 2018-10-08 DIAGNOSIS — M179 Osteoarthritis of knee, unspecified: Secondary | ICD-10-CM | POA: Diagnosis present

## 2018-10-08 DIAGNOSIS — Z9989 Dependence on other enabling machines and devices: Secondary | ICD-10-CM | POA: Diagnosis not present

## 2018-10-08 DIAGNOSIS — E1142 Type 2 diabetes mellitus with diabetic polyneuropathy: Secondary | ICD-10-CM | POA: Insufficient documentation

## 2018-10-08 DIAGNOSIS — M25561 Pain in right knee: Secondary | ICD-10-CM | POA: Diagnosis present

## 2018-10-08 DIAGNOSIS — G8918 Other acute postprocedural pain: Secondary | ICD-10-CM | POA: Diagnosis not present

## 2018-10-08 DIAGNOSIS — I110 Hypertensive heart disease with heart failure: Secondary | ICD-10-CM | POA: Diagnosis not present

## 2018-10-08 HISTORY — PX: TOTAL KNEE ARTHROPLASTY: SHX125

## 2018-10-08 LAB — GLUCOSE, CAPILLARY
Glucose-Capillary: 179 mg/dL — ABNORMAL HIGH (ref 70–99)
Glucose-Capillary: 178 mg/dL — ABNORMAL HIGH (ref 70–99)
Glucose-Capillary: 280 mg/dL — ABNORMAL HIGH (ref 70–99)
Glucose-Capillary: 340 mg/dL — ABNORMAL HIGH (ref 70–99)

## 2018-10-08 LAB — TYPE AND SCREEN
ABO/RH(D): O POS
Antibody Screen: NEGATIVE

## 2018-10-08 SURGERY — ARTHROPLASTY, KNEE, TOTAL
Anesthesia: Spinal | Site: Knee | Laterality: Right

## 2018-10-08 MED ORDER — METHOCARBAMOL 500 MG PO TABS
500.0000 mg | ORAL_TABLET | Freq: Four times a day (QID) | ORAL | Status: DC | PRN
  Administered 2018-10-08 – 2018-10-11 (×6): 500 mg via ORAL
  Filled 2018-10-08 (×6): qty 1

## 2018-10-08 MED ORDER — METOCLOPRAMIDE HCL 5 MG PO TABS: 5.0000 mg | ORAL_TABLET | Freq: Three times a day (TID) | ORAL | Status: DC | PRN

## 2018-10-08 MED ORDER — PROPOFOL 10 MG/ML IV BOLUS
INTRAVENOUS | Status: AC
  Filled 2018-10-08: qty 20

## 2018-10-08 MED ORDER — ONDANSETRON HCL 4 MG/2ML IJ SOLN: 4.0000 mg | Freq: Once | INTRAMUSCULAR | Status: DC | PRN

## 2018-10-08 MED ORDER — DEXAMETHASONE SODIUM PHOSPHATE 10 MG/ML IJ SOLN
8.0000 mg | Freq: Once | INTRAMUSCULAR | Status: AC
  Administered 2018-10-08: 8 mg via INTRAVENOUS

## 2018-10-08 MED ORDER — GABAPENTIN 300 MG PO CAPS
300.0000 mg | ORAL_CAPSULE | Freq: Once | ORAL | Status: AC
  Administered 2018-10-08: 300 mg via ORAL
  Filled 2018-10-08: qty 1

## 2018-10-08 MED ORDER — GABAPENTIN 300 MG PO CAPS
300.0000 mg | ORAL_CAPSULE | Freq: Three times a day (TID) | ORAL | Status: DC
  Administered 2018-10-08 – 2018-10-11 (×9): 300 mg via ORAL
  Filled 2018-10-08 (×9): qty 1

## 2018-10-08 MED ORDER — DEXAMETHASONE SODIUM PHOSPHATE 10 MG/ML IJ SOLN
INTRAMUSCULAR | Status: AC
  Filled 2018-10-08: qty 1

## 2018-10-08 MED ORDER — ALPRAZOLAM 0.5 MG PO TABS
0.5000 mg | ORAL_TABLET | Freq: Two times a day (BID) | ORAL | Status: DC
  Administered 2018-10-08 – 2018-10-11 (×6): 0.5 mg via ORAL
  Filled 2018-10-08 (×6): qty 1

## 2018-10-08 MED ORDER — DEXAMETHASONE SODIUM PHOSPHATE 10 MG/ML IJ SOLN
10.0000 mg | Freq: Once | INTRAMUSCULAR | Status: AC
  Administered 2018-10-09: 10 mg via INTRAVENOUS
  Filled 2018-10-08: qty 1

## 2018-10-08 MED ORDER — SODIUM CHLORIDE (PF) 0.9 % IJ SOLN
INTRAMUSCULAR | Status: DC | PRN
  Administered 2018-10-08: 60 mL

## 2018-10-08 MED ORDER — LABETALOL HCL 5 MG/ML IV SOLN
5.0000 mg | INTRAVENOUS | Status: DC
  Administered 2018-10-08: 5 mg via INTRAVENOUS
  Filled 2018-10-08: qty 4

## 2018-10-08 MED ORDER — DIPHENHYDRAMINE HCL 12.5 MG/5ML PO ELIX: 12.5000 mg | ORAL_SOLUTION | ORAL | Status: DC | PRN

## 2018-10-08 MED ORDER — MENTHOL 3 MG MT LOZG: 1.0000 | LOZENGE | OROMUCOSAL | Status: DC | PRN

## 2018-10-08 MED ORDER — OXYCODONE HCL 5 MG PO TABS
5.0000 mg | ORAL_TABLET | ORAL | Status: DC | PRN
  Administered 2018-10-08: 5 mg via ORAL
  Administered 2018-10-09 (×3): 10 mg via ORAL
  Administered 2018-10-09: 5 mg via ORAL
  Administered 2018-10-11 (×2): 10 mg via ORAL
  Filled 2018-10-08 (×4): qty 2
  Filled 2018-10-08 (×2): qty 1
  Filled 2018-10-08: qty 2

## 2018-10-08 MED ORDER — PROPOFOL 500 MG/50ML IV EMUL
INTRAVENOUS | Status: DC | PRN
  Administered 2018-10-08: 100 ug/kg/min via INTRAVENOUS

## 2018-10-08 MED ORDER — SODIUM CHLORIDE 0.9 % IR SOLN
Status: DC | PRN
  Administered 2018-10-08: 1000 mL

## 2018-10-08 MED ORDER — ONDANSETRON HCL 4 MG PO TABS
4.0000 mg | ORAL_TABLET | Freq: Four times a day (QID) | ORAL | Status: DC | PRN
  Administered 2018-10-09: 4 mg via ORAL
  Filled 2018-10-08: qty 1

## 2018-10-08 MED ORDER — ONDANSETRON HCL 4 MG/2ML IJ SOLN: 4.0000 mg | Freq: Four times a day (QID) | INTRAMUSCULAR | Status: DC | PRN

## 2018-10-08 MED ORDER — SODIUM CHLORIDE 0.9 % IV SOLN
INTRAVENOUS | Status: DC
  Administered 2018-10-08 – 2018-10-09 (×2): via INTRAVENOUS

## 2018-10-08 MED ORDER — POLYETHYLENE GLYCOL 3350 17 G PO PACK: 17.0000 g | PACK | Freq: Every day | ORAL | Status: DC | PRN

## 2018-10-08 MED ORDER — DOCUSATE SODIUM 100 MG PO CAPS
100.0000 mg | ORAL_CAPSULE | Freq: Two times a day (BID) | ORAL | Status: DC
  Administered 2018-10-09 – 2018-10-10 (×4): 100 mg via ORAL
  Filled 2018-10-08 (×6): qty 1

## 2018-10-08 MED ORDER — CHLORHEXIDINE GLUCONATE 4 % EX LIQD
60.0000 mL | Freq: Once | CUTANEOUS | Status: AC
  Administered 2018-10-08: 4 via TOPICAL

## 2018-10-08 MED ORDER — BUPIVACAINE LIPOSOME 1.3 % IJ SUSP
INTRAMUSCULAR | Status: DC | PRN
  Administered 2018-10-08: 20 mL

## 2018-10-08 MED ORDER — BENAZEPRIL HCL 20 MG PO TABS
40.0000 mg | ORAL_TABLET | Freq: Every day | ORAL | Status: DC
  Administered 2018-10-09 – 2018-10-11 (×3): 40 mg via ORAL
  Filled 2018-10-08 (×2): qty 2
  Filled 2018-10-08: qty 4

## 2018-10-08 MED ORDER — VENLAFAXINE HCL ER 75 MG PO CP24
75.0000 mg | ORAL_CAPSULE | Freq: Every day | ORAL | Status: DC
  Administered 2018-10-09 – 2018-10-11 (×3): 75 mg via ORAL
  Filled 2018-10-08 (×3): qty 1

## 2018-10-08 MED ORDER — PHENOL 1.4 % MT LIQD
1.0000 | OROMUCOSAL | Status: DC | PRN
  Filled 2018-10-08: qty 177

## 2018-10-08 MED ORDER — ACETAMINOPHEN 500 MG PO TABS
1000.0000 mg | ORAL_TABLET | Freq: Four times a day (QID) | ORAL | Status: AC
  Administered 2018-10-08 – 2018-10-09 (×4): 1000 mg via ORAL
  Filled 2018-10-08 (×4): qty 2

## 2018-10-08 MED ORDER — FLEET ENEMA 7-19 GM/118ML RE ENEM: 1.0000 | ENEMA | Freq: Once | RECTAL | Status: DC | PRN

## 2018-10-08 MED ORDER — METHOCARBAMOL 500 MG IVPB - SIMPLE MED
500.0000 mg | Freq: Four times a day (QID) | INTRAVENOUS | Status: DC | PRN
  Filled 2018-10-08: qty 50

## 2018-10-08 MED ORDER — BISACODYL 10 MG RE SUPP: 10.0000 mg | Freq: Every day | RECTAL | Status: DC | PRN

## 2018-10-08 MED ORDER — TRANEXAMIC ACID-NACL 1000-0.7 MG/100ML-% IV SOLN
1000.0000 mg | Freq: Once | INTRAVENOUS | Status: AC
  Administered 2018-10-08: 1000 mg via INTRAVENOUS
  Filled 2018-10-08: qty 100

## 2018-10-08 MED ORDER — ROPIVACAINE HCL 7.5 MG/ML IJ SOLN
INTRAMUSCULAR | Status: DC | PRN
  Administered 2018-10-08: 20 mL via PERINEURAL

## 2018-10-08 MED ORDER — MIDAZOLAM HCL 2 MG/2ML IJ SOLN
1.0000 mg | INTRAMUSCULAR | Status: DC
  Administered 2018-10-08: 2 mg via INTRAVENOUS
  Filled 2018-10-08: qty 2

## 2018-10-08 MED ORDER — HYDROXYUREA 500 MG PO CAPS
1000.0000 mg | ORAL_CAPSULE | Freq: Every day | ORAL | Status: DC
  Administered 2018-10-09 – 2018-10-11 (×3): 1000 mg via ORAL
  Filled 2018-10-08 (×3): qty 2

## 2018-10-08 MED ORDER — ATORVASTATIN CALCIUM 40 MG PO TABS
80.0000 mg | ORAL_TABLET | Freq: Every day | ORAL | Status: DC
  Administered 2018-10-08 – 2018-10-10 (×3): 80 mg via ORAL
  Filled 2018-10-08 (×3): qty 2

## 2018-10-08 MED ORDER — APIXABAN 2.5 MG PO TABS
2.5000 mg | ORAL_TABLET | Freq: Two times a day (BID) | ORAL | Status: DC
  Administered 2018-10-09 – 2018-10-11 (×5): 2.5 mg via ORAL
  Filled 2018-10-08 (×5): qty 1

## 2018-10-08 MED ORDER — PROPOFOL 10 MG/ML IV BOLUS
INTRAVENOUS | Status: AC
  Filled 2018-10-08: qty 40

## 2018-10-08 MED ORDER — INSULIN ASPART 100 UNIT/ML ~~LOC~~ SOLN
0.0000 [IU] | Freq: Three times a day (TID) | SUBCUTANEOUS | Status: DC
  Administered 2018-10-08: 8 [IU] via SUBCUTANEOUS
  Administered 2018-10-09 (×2): 11 [IU] via SUBCUTANEOUS
  Administered 2018-10-09 – 2018-10-10 (×2): 8 [IU] via SUBCUTANEOUS
  Administered 2018-10-10 (×2): 15 [IU] via SUBCUTANEOUS
  Administered 2018-10-11: 5 [IU] via SUBCUTANEOUS

## 2018-10-08 MED ORDER — ONDANSETRON HCL 4 MG/2ML IJ SOLN
INTRAMUSCULAR | Status: AC
  Filled 2018-10-08: qty 2

## 2018-10-08 MED ORDER — CEFAZOLIN SODIUM-DEXTROSE 2-4 GM/100ML-% IV SOLN
2.0000 g | Freq: Four times a day (QID) | INTRAVENOUS | Status: AC
  Administered 2018-10-08 (×2): 2 g via INTRAVENOUS
  Filled 2018-10-08 (×2): qty 100

## 2018-10-08 MED ORDER — MEPERIDINE HCL 50 MG/ML IJ SOLN: 6.2500 mg | INTRAMUSCULAR | Status: DC | PRN

## 2018-10-08 MED ORDER — LACTATED RINGERS IV SOLN
INTRAVENOUS | Status: DC
  Administered 2018-10-08 (×2): via INTRAVENOUS

## 2018-10-08 MED ORDER — TRANEXAMIC ACID-NACL 1000-0.7 MG/100ML-% IV SOLN
1000.0000 mg | INTRAVENOUS | Status: AC
  Administered 2018-10-08: 1000 mg via INTRAVENOUS
  Filled 2018-10-08: qty 100

## 2018-10-08 MED ORDER — ACETAMINOPHEN 10 MG/ML IV SOLN
1000.0000 mg | Freq: Four times a day (QID) | INTRAVENOUS | Status: DC
  Administered 2018-10-08: 1000 mg via INTRAVENOUS
  Filled 2018-10-08: qty 100

## 2018-10-08 MED ORDER — FUROSEMIDE 40 MG PO TABS
40.0000 mg | ORAL_TABLET | Freq: Two times a day (BID) | ORAL | Status: DC
  Administered 2018-10-08 – 2018-10-11 (×6): 40 mg via ORAL
  Filled 2018-10-08 (×6): qty 1

## 2018-10-08 MED ORDER — METOCLOPRAMIDE HCL 5 MG/ML IJ SOLN: 5.0000 mg | Freq: Three times a day (TID) | INTRAMUSCULAR | Status: DC | PRN

## 2018-10-08 MED ORDER — SODIUM CHLORIDE (PF) 0.9 % IJ SOLN
INTRAMUSCULAR | Status: AC
  Filled 2018-10-08: qty 50

## 2018-10-08 MED ORDER — FENTANYL CITRATE (PF) 100 MCG/2ML IJ SOLN
50.0000 ug | INTRAMUSCULAR | Status: DC
  Administered 2018-10-08 (×2): 50 ug via INTRAVENOUS
  Filled 2018-10-08: qty 2

## 2018-10-08 MED ORDER — ONDANSETRON HCL 4 MG/2ML IJ SOLN
4.0000 mg | Freq: Once | INTRAMUSCULAR | Status: AC
  Administered 2018-10-08: 4 mg via INTRAVENOUS

## 2018-10-08 MED ORDER — SODIUM CHLORIDE (PF) 0.9 % IJ SOLN
INTRAMUSCULAR | Status: AC
  Filled 2018-10-08: qty 10

## 2018-10-08 MED ORDER — PANTOPRAZOLE SODIUM 40 MG PO TBEC
40.0000 mg | DELAYED_RELEASE_TABLET | Freq: Every day | ORAL | Status: DC
  Administered 2018-10-09 – 2018-10-11 (×3): 40 mg via ORAL
  Filled 2018-10-08 (×3): qty 1

## 2018-10-08 MED ORDER — MORPHINE SULFATE (PF) 2 MG/ML IV SOLN: 1.0000 mg | INTRAVENOUS | Status: DC | PRN

## 2018-10-08 MED ORDER — HYDROMORPHONE HCL 1 MG/ML IJ SOLN: 0.2500 mg | INTRAMUSCULAR | Status: DC | PRN

## 2018-10-08 MED ORDER — TRAMADOL HCL 50 MG PO TABS
50.0000 mg | ORAL_TABLET | Freq: Four times a day (QID) | ORAL | Status: DC | PRN
  Administered 2018-10-08: 100 mg via ORAL
  Administered 2018-10-10: 50 mg via ORAL
  Filled 2018-10-08: qty 1
  Filled 2018-10-08: qty 2

## 2018-10-08 MED ORDER — INSULIN ASPART PROT & ASPART (70-30 MIX) 100 UNIT/ML ~~LOC~~ SUSP
44.0000 [IU] | Freq: Two times a day (BID) | SUBCUTANEOUS | Status: DC
  Administered 2018-10-09 – 2018-10-11 (×5): 44 [IU] via SUBCUTANEOUS
  Filled 2018-10-08 (×2): qty 10

## 2018-10-08 SURGICAL SUPPLY — 60 items
ATTUNE MED DOME PAT 41 KNEE (Knees) ×2 IMPLANT
ATTUNE PS FEM RT SZ9 CEM KNEE (Femur) ×2 IMPLANT
ATTUNE PS RP INSR SZ9 8 KNEE (Femur) ×2 IMPLANT
BAG ZIPLOCK 12X15 (MISCELLANEOUS) ×2 IMPLANT
BANDAGE ACE 6X5 VEL STRL LF (GAUZE/BANDAGES/DRESSINGS) ×2 IMPLANT
BASE TIBIAL ROT PLAT SZ 8 KNEE (Knees) ×1 IMPLANT
BLADE SAG 18X100X1.27 (BLADE) ×2 IMPLANT
BLADE SAW SGTL 11.0X1.19X90.0M (BLADE) ×2 IMPLANT
BLADE SURG SZ10 CARB STEEL (BLADE) ×4 IMPLANT
BNDG ELASTIC 6X10 VLCR STRL LF (GAUZE/BANDAGES/DRESSINGS) ×2 IMPLANT
BOWL SMART MIX CTS (DISPOSABLE) ×2 IMPLANT
CEMENT HV SMART SET (Cement) ×4 IMPLANT
COVER SURGICAL LIGHT HANDLE (MISCELLANEOUS) ×2 IMPLANT
COVER WAND RF STERILE (DRAPES) IMPLANT
CUFF TOURN SGL QUICK 34 (TOURNIQUET CUFF) ×1
CUFF TRNQT CYL 34X4X40X1 (TOURNIQUET CUFF) ×1 IMPLANT
DECANTER SPIKE VIAL GLASS SM (MISCELLANEOUS) ×2 IMPLANT
DRAPE U-SHAPE 47X51 STRL (DRAPES) ×2 IMPLANT
DRSG ADAPTIC 3X8 NADH LF (GAUZE/BANDAGES/DRESSINGS) ×2 IMPLANT
DRSG PAD ABDOMINAL 8X10 ST (GAUZE/BANDAGES/DRESSINGS) ×2 IMPLANT
DURAPREP 26ML APPLICATOR (WOUND CARE) ×2 IMPLANT
ELECT REM PT RETURN 15FT ADLT (MISCELLANEOUS) ×2 IMPLANT
EVACUATOR 1/8 PVC DRAIN (DRAIN) ×2 IMPLANT
GAUZE SPONGE 4X4 12PLY STRL (GAUZE/BANDAGES/DRESSINGS) ×2 IMPLANT
GLOVE BIO SURGEON STRL SZ7 (GLOVE) ×2 IMPLANT
GLOVE BIO SURGEON STRL SZ8 (GLOVE) ×2 IMPLANT
GLOVE BIOGEL PI IND STRL 6.5 (GLOVE) ×1 IMPLANT
GLOVE BIOGEL PI IND STRL 7.0 (GLOVE) ×1 IMPLANT
GLOVE BIOGEL PI IND STRL 8 (GLOVE) ×1 IMPLANT
GLOVE BIOGEL PI INDICATOR 6.5 (GLOVE) ×1
GLOVE BIOGEL PI INDICATOR 7.0 (GLOVE) ×1
GLOVE BIOGEL PI INDICATOR 8 (GLOVE) ×1
GLOVE SURG SS PI 6.5 STRL IVOR (GLOVE) ×2 IMPLANT
GOWN STRL REUS W/TWL LRG LVL3 (GOWN DISPOSABLE) ×4 IMPLANT
GOWN STRL REUS W/TWL XL LVL3 (GOWN DISPOSABLE) ×2 IMPLANT
HANDPIECE INTERPULSE COAX TIP (DISPOSABLE) ×1
HOLDER FOLEY CATH W/STRAP (MISCELLANEOUS) IMPLANT
IMMOBILIZER KNEE 20 (SOFTGOODS) ×4 IMPLANT
IMMOBILIZER KNEE 20 THIGH 36 (SOFTGOODS) ×1 IMPLANT
MANIFOLD NEPTUNE II (INSTRUMENTS) ×2 IMPLANT
NS IRRIG 1000ML POUR BTL (IV SOLUTION) ×2 IMPLANT
PACK TOTAL KNEE CUSTOM (KITS) ×2 IMPLANT
PAD ABD 7.5X8 STRL (GAUZE/BANDAGES/DRESSINGS) ×2 IMPLANT
PADDING CAST COTTON 6X4 STRL (CAST SUPPLIES) ×6 IMPLANT
PIN STEINMAN FIXATION KNEE (PIN) ×2 IMPLANT
PIN THREADED HEADED SIGMA (PIN) ×2 IMPLANT
PROTECTOR NERVE ULNAR (MISCELLANEOUS) ×2 IMPLANT
SET HNDPC FAN SPRY TIP SCT (DISPOSABLE) ×1 IMPLANT
STRIP CLOSURE SKIN 1/2X4 (GAUZE/BANDAGES/DRESSINGS) ×4 IMPLANT
SUT MNCRL AB 4-0 PS2 18 (SUTURE) ×2 IMPLANT
SUT STRATAFIX 0 PDS 27 VIOLET (SUTURE) ×2
SUT VIC AB 2-0 CT1 27 (SUTURE) ×3
SUT VIC AB 2-0 CT1 TAPERPNT 27 (SUTURE) ×3 IMPLANT
SUTURE STRATFX 0 PDS 27 VIOLET (SUTURE) ×1 IMPLANT
TAPE STRIPS DRAPE STRL (GAUZE/BANDAGES/DRESSINGS) ×2 IMPLANT
TIBIAL BASE ROT PLAT SZ 8 KNEE (Knees) ×2 IMPLANT
TRAY FOLEY MTR SLVR 16FR STAT (SET/KITS/TRAYS/PACK) ×2 IMPLANT
WATER STERILE IRR 1000ML POUR (IV SOLUTION) ×4 IMPLANT
WRAP KNEE MAXI GEL POST OP (GAUZE/BANDAGES/DRESSINGS) ×2 IMPLANT
YANKAUER SUCT BULB TIP 10FT TU (MISCELLANEOUS) ×2 IMPLANT

## 2018-10-08 NOTE — Discharge Instructions (Signed)
° °Dr. Frank Aluisio °Total Joint Specialist °[facility] °[address] °[place], [place] [address] °[phone] ° °TOTAL KNEE REPLACEMENT POSTOPERATIVE DIRECTIONS ° °Knee Rehabilitation, Guidelines Following Surgery  °Results after knee surgery are often greatly improved when you follow the exercise, range of motion and muscle strengthening exercises prescribed by your doctor. Safety measures are also important to protect the knee from further injury. Any time any of these exercises cause you to have increased pain or swelling in your knee joint, decrease the amount until you are comfortable again and slowly increase them. If you have problems or questions, call your caregiver or physical therapist for advice.  ° °HOME CARE INSTRUCTIONS  °• Remove items at home which could result in a fall. This includes throw rugs or furniture in walking pathways.  °· ICE to the affected knee every three hours for 30 minutes at a time and then as needed for pain and swelling.  Continue to use ice on the knee for pain and swelling from surgery. You may notice swelling that will progress down to the foot and ankle.  This is normal after surgery.  Elevate the leg when you are not up walking on it.   °· Continue to use the breathing machine which will help keep your temperature down.  It is common for your temperature to cycle up and down following surgery, especially at night when you are not up moving around and exerting yourself.  The breathing machine keeps your lungs expanded and your temperature down. °· Do not place pillow under knee, focus on keeping the knee straight while resting ° °DIET °You may resume your previous home diet once your are discharged from the hospital. ° °DRESSING / WOUND CARE / SHOWERING °You may shower 3 days after surgery, but keep the wounds dry during showering.  You may use an occlusive plastic wrap (Press'n Seal for example), NO SOAKING/SUBMERGING IN THE BATHTUB.  If the bandage  gets wet, change with a clean dry gauze.  If the incision gets wet, pat the wound dry with a clean towel. °You may start showering once you are discharged home but do not submerge the incision under water. Just pat the incision dry and apply a dry gauze dressing on daily. °Change the surgical dressing daily and reapply a dry dressing each time. ° °ACTIVITY °Walk with your walker as instructed. °Use walker as long as suggested by your caregivers. °Avoid periods of inactivity such as sitting longer than an hour when not asleep. This helps prevent blood clots.  °You may resume a sexual relationship in one month or when given the OK by your doctor.  °You may return to work once you are cleared by your doctor.  °Do not drive a car for 6 weeks or until released by you surgeon.  °Do not drive while taking narcotics. ° °WEIGHT BEARING °Weight bearing as tolerated with assist device (walker, cane, etc) as directed, use it as long as suggested by your surgeon or therapist, typically at least 4-6 weeks. ° °POSTOPERATIVE CONSTIPATION PROTOCOL °Constipation - defined medically as fewer than three stools per week and severe constipation as less than one stool per week. ° °One of the most common issues patients have following surgery is constipation.  Even if you have a regular bowel pattern at home, your normal regimen is likely to be disrupted due to multiple reasons following surgery.  Combination of anesthesia, postoperative narcotics, change in appetite and fluid intake all can affect your bowels.    In order to avoid complications following surgery, here are some recommendations in order to help you during your recovery period. ° °Colace (docusate) - Pick up an over-the-counter form of Colace or another stool softener and take twice a day as long as you are requiring postoperative pain medications.  Take with a full glass of water daily.  If you experience loose stools or diarrhea, hold the colace until you stool forms back  up.  If your symptoms do not get better within 1 week or if they get worse, check with your doctor. ° °Dulcolax (bisacodyl) - Pick up over-the-counter and take as directed by the product packaging as needed to assist with the movement of your bowels.  Take with a full glass of water.  Use this product as needed if not relieved by Colace only.  ° °MiraLax (polyethylene glycol) - Pick up over-the-counter to have on hand.  MiraLax is a solution that will increase the amount of water in your bowels to assist with bowel movements.  Take as directed and can mix with a glass of water, juice, soda, coffee, or tea.  Take if you go more than two days without a movement. °Do not use MiraLax more than once per day. Call your doctor if you are still constipated or irregular after using this medication for 7 days in a row. ° °If you continue to have problems with postoperative constipation, please contact the office for further assistance and recommendations.  If you experience "the worst abdominal pain ever" or develop nausea or vomiting, please contact the office immediatly for further recommendations for treatment. ° °ITCHING ° If you experience itching with your medications, try taking only a single pain pill, or even half a pain pill at a time.  You can also use Benadryl over the counter for itching or also to help with sleep.  ° °TED HOSE STOCKINGS °Wear the elastic stockings on both legs for three weeks following surgery during the day but you may remove then at night for sleeping. ° °MEDICATIONS °See your medication summary on the “After Visit Summary” that the nursing staff will review with you prior to discharge.  You may have some home medications which will be placed on hold until you complete the course of blood thinner medication.  It is important for you to complete the blood thinner medication as prescribed by your surgeon.  Continue your approved medications as instructed at time of discharge. ° °PRECAUTIONS °If  you experience chest pain or shortness of breath - call 911 immediately for transfer to the hospital emergency department.  °If you develop a fever greater that 101 F, purulent drainage from wound, increased redness or drainage from wound, foul odor from the wound/dressing, or calf pain - CONTACT YOUR SURGEON.   °                                                °FOLLOW-UP APPOINTMENTS °Make sure you keep all of your appointments after your operation with your surgeon and caregivers. You should call the office at the above phone number and make an appointment for approximately two weeks after the date of your surgery or on the date instructed by your surgeon outlined in the "After Visit Summary". ° ° °RANGE OF MOTION AND STRENGTHENING EXERCISES  °Rehabilitation of the knee is important following a knee injury or   an operation. After just a few days of immobilization, the muscles of the thigh which control the knee become weakened and shrink (atrophy). Knee exercises are designed to build up the tone and strength of the thigh muscles and to improve knee motion. Often times heat used for twenty to thirty minutes before working out will loosen up your tissues and help with improving the range of motion but do not use heat for the first two weeks following surgery. These exercises can be done on a training (exercise) mat, on the floor, on a table or on a bed. Use what ever works the best and is most comfortable for you Knee exercises include:  °• Leg Lifts - While your knee is still immobilized in a splint or cast, you can do straight leg raises. Lift the leg to 60 degrees, hold for 3 sec, and slowly lower the leg. Repeat 10-20 times 2-3 times daily. Perform this exercise against resistance later as your knee gets better.  °• Quad and Hamstring Sets - Tighten up the muscle on the front of the thigh (Quad) and hold for 5-10 sec. Repeat this 10-20 times hourly. Hamstring sets are done by pushing the foot backward against an  object and holding for 5-10 sec. Repeat as with quad sets.  °· Leg Slides: Lying on your back, slowly slide your foot toward your buttocks, bending your knee up off the floor (only go as far as is comfortable). Then slowly slide your foot back down until your leg is flat on the floor again. °· Angel Wings: Lying on your back spread your legs to the side as far apart as you can without causing discomfort.  °A rehabilitation program following serious knee injuries can speed recovery and prevent re-injury in the future due to weakened muscles. Contact your doctor or a physical therapist for more information on knee rehabilitation.  ° °IF YOU ARE TRANSFERRED TO A SKILLED REHAB FACILITY °If the patient is transferred to a skilled rehab facility following release from the hospital, a list of the current medications will be sent to the facility for the patient to continue.  When discharged from the skilled rehab facility, please have the facility set up the patient's Home Health Physical Therapy prior to being released. Also, the skilled facility will be responsible for providing the patient with their medications at time of release from the facility to include their pain medication, the muscle relaxants, and their blood thinner medication. If the patient is still at the rehab facility at time of the two week follow up appointment, the skilled rehab facility will also need to assist the patient in arranging follow up appointment in our office and any transportation needs. ° °MAKE SURE YOU:  °• Understand these instructions.  °• Get help right away if you are not doing well or get worse.  ° ° °Pick up stool softner and laxative for home use following surgery while on pain medications. °Do not submerge incision under water. °Please use good hand washing techniques while changing dressing each day. °May shower starting three days after surgery. °Please use a clean towel to pat the incision dry following showers. °Continue to  use ice for pain and swelling after surgery. °Do not use any lotions or creams on the incision until instructed by your surgeon. ° °

## 2018-10-08 NOTE — Transfer of Care (Signed)
Immediate Anesthesia Transfer of Care Note  Patient: [name]  Procedure(s) Performed: RIGHT TOTAL KNEE ARTHROPLASTY (Right Knee)  Patient Location: PACU  Anesthesia Type:Regional and Spinal  Level of Consciousness: sedated  Airway & Oxygen Therapy: Patient Spontanous Breathing and Patient connected to face mask oxygen  Post-op Assessment: Report given to RN and Post -op Vital signs reviewed and stable  Post vital signs: Reviewed and stable  Last Vitals:  Vitals Value Taken Time  BP 136/66 10/08/2018 11:04 AM  Temp    Pulse 53 10/08/2018 11:06 AM  Resp 9 10/08/2018 11:06 AM  SpO2 100 % 10/08/2018 11:06 AM  Vitals shown include unvalidated device data.  Last Pain:  Vitals:   10/08/18 0847  TempSrc:   PainSc: 0-No pain         Complications: No apparent anesthesia complications

## 2018-10-08 NOTE — Progress Notes (Signed)
AssistedDr. Ossey with right, ultrasound guided, adductor canal block. Side rails up, monitors on throughout procedure. See vital signs in flow sheet. Tolerated Procedure well.  

## 2018-10-08 NOTE — Plan of Care (Signed)
  Problem: Clinical Measurements: Goal: Will remain free from infection Outcome: Progressing   Problem: Pain Managment: Goal: General experience of comfort will improve Outcome: Progressing   Problem: Safety: Goal: Ability to remain free from injury will improve Outcome: Progressing   

## 2018-10-08 NOTE — Evaluation (Signed)
Physical Therapy Evaluation Patient Details Name: [name] MRN: [id number] DOB: 01/06/1941 Today's Date: 10/08/2018   History of Present Illness  78 yo male s/p R TKR on 10/08/18. PMH includes diabetes with neuropathy and retinopathy, morbid obesity, L MCA, HLD, HTN, anxiety, CHF, PAF, OSA, GERD, skin cancer, depression, PVD, back surgery, L TKR.   Clinical Impression   Pt presents with R knee pain, decreased R knee ROM, LE weakness, history of peripheral neuropathy with foot numbness R>L, difficulty performing all mobility tasks, and decreased activity tolerance due to pain. Pt to benefit from acute PT to address deficits. Pt ambulated 4 ft with increased time and effort with mod assist +2, unable to tolerate more walking due to weakness and numbness of feet. Pt educated on ankle pumps (20/hour) to perform this afternoon/evening to increase circulation, to pt's tolerance and limited by pain. PT to progress mobility as tolerated, and will continue to follow acutely.        Follow Up Recommendations Follow surgeon's recommendation for DC plan and follow-up therapies;Supervision for mobility/OOB(HHPT --> OPPT)    Equipment Recommendations  Rolling walker with 5" wheels;3in1 (PT)(DME ordered )    Recommendations for Other Services       Precautions / Restrictions Precautions Precautions: Fall Required Braces or Orthoses: Knee Immobilizer - Right Knee Immobilizer - Right: Discontinue once straight leg raise with < 10 degree lag;On when out of bed or walking Restrictions Weight Bearing Restrictions: No Other Position/Activity Restrictions: WBAT       Mobility  Bed Mobility Overal bed mobility: Needs Assistance Bed Mobility: Supine to Sit     Supine to sit: Min assist;+2 for physical assistance;HOB elevated     General bed mobility comments: Min assist for LE management, trunk elevation, scooting to EOB. Pt with increased time and effort to perform.   Transfers Overall  transfer level: Needs assistance Equipment used: Rolling walker (2 wheeled) Transfers: Sit to/from [facility] Sit to Stand: Mod assist;+2 physical assistance;From elevated surface Stand pivot transfers: Mod assist;+2 safety/equipment       General transfer comment: Mod assist for power up, steadying. Pt required multimodal cues to perform hip extension to come to full standing. Verbal cuing provided for hand placement as well. Mod assist for return to bed via stand pivot for directing, steadying, and supporting pt. Pt assisted with slow lowering.   Ambulation/Gait Ambulation/Gait assistance: Mod assist;+2 safety/equipment Gait Distance (Feet): 4 Feet Assistive device: Rolling walker (2 wheeled) Gait Pattern/deviations: Step-to pattern;Decreased stride length;Decreased weight shift to right;Decreased stance time - right;Antalgic;Trunk flexed     General Gait Details: Mod assist +2 for steadying, holding pt up at times. Pt stating "I am falling" when he felt imbalanced, but was safe for entirety of session. Pt guided to recliner after few ft of ambulation for safety. Pt with foot numbness and weakness limiting him.   Stairs            Wheelchair Mobility    Modified Rankin (Stroke Patients Only)       Balance Overall balance assessment: Needs assistance;History of Falls(Pt reports fall 10 months ago, and states he is unsteady at baseline due to neuropathy ) Sitting-balance support: No upper extremity supported Sitting balance-Leahy Scale: Fair     Standing balance support: Bilateral upper extremity supported Standing balance-Leahy Scale: Poor Standing balance comment: relies heavily on RW and PT for support  Pertinent Vitals/Pain Pain Assessment: 0-10 Pain Score: 3  Pain Location: R knee  Pain Descriptors / Indicators: Sore;Aching Pain Intervention(s): Limited activity within patient's tolerance;Repositioned;Ice  applied;Monitored during session    [place] expects to be discharged to:: Private residence Living Arrangements: Spouse/significant other Available Help at Discharge: Family;Available 24 hours/day Type of Home: House Home Access: Stairs to enter Entrance Stairs-Rails: Psychiatric nurse of Steps: 3 Home Layout: Multi-level Home Equipment: Cane - single point;Other (comment)(walking stick )      Prior Function Level of Independence: Independent with assistive device(s)         Comments: occasionally used cane/walking stick for ambulation as needed      Hand Dominance   Dominant Hand: Right    Extremity/Trunk Assessment   Upper Extremity Assessment Upper Extremity Assessment: Generalized weakness    Lower Extremity Assessment Lower Extremity Assessment: Generalized weakness;RLE deficits/detail;LLE deficits/detail RLE Deficits / Details: suspected post-surgical weakness; able to perform ankle pumps, heel slide to 45*, weak quad set  RLE Sensation: history of peripheral neuropathy(pt reports R foot numbness to be a regular occurrance, and is "mostly numb" currently ) LLE Sensation: history of peripheral neuropathy(tingling in L toes due to neuropathy )    Cervical / Trunk Assessment Cervical / Trunk Assessment: Normal  Communication   Communication: No difficulties  Cognition Arousal/Alertness: Awake/alert Behavior During Therapy: WFL for tasks assessed/performed Overall Cognitive Status: History of cognitive impairments - at baseline                                 General Comments: Per RN and NT, pt's son states pt has become more forgetful recently and requires frequent reminders.       General Comments      Exercises Total Joint Exercises Goniometric ROM: knee aarom ~5-45*, limited by pain    Assessment/Plan    PT Assessment Patient needs continued PT services  PT Problem List Decreased  strength;Pain;Decreased range of motion;Decreased activity tolerance;Decreased knowledge of use of DME;Decreased balance;Decreased safety awareness;Decreased mobility       PT Treatment Interventions DME instruction;Therapeutic activities;Therapeutic exercise;Gait training;Patient/family education;Balance training;Stair training;Functional mobility training    PT Goals (Current goals can be found in the Care Plan section)  Acute Rehab PT Goals Patient Stated Goal: be more steady  PT Goal Formulation: With patient Time For Goal Achievement: 10/15/18 Potential to Achieve Goals: Good    Frequency 7X/week   Barriers to discharge        Co-evaluation               AM-PAC PT "6 Clicks" Mobility  Outcome Measure Help needed turning from your back to your side while in a flat bed without using bedrails?: A Lot Help needed moving from lying on your back to sitting on the side of a flat bed without using bedrails?: A Lot Help needed moving to and from a bed to a chair (including a wheelchair)?: A Lot Help needed standing up from a chair using your arms (e.g., wheelchair or bedside chair)?: A Lot Help needed to walk in hospital room?: A Lot Help needed climbing 3-5 steps with a railing? : A Lot 6 Click Score: 12    End of Session Equipment Utilized During Treatment: Gait belt Activity Tolerance: Patient tolerated treatment well Patient left: in chair;with chair alarm set;with call bell/phone within reach;with SCD's reapplied Nurse Communication: Mobility status PT Visit Diagnosis: Other abnormalities of gait  and mobility (R26.89);Difficulty in walking, not elsewhere classified (R26.2)    Time: 3212-2482 PT Time Calculation (min) (ACUTE ONLY): 31 min   Charges:   PT Evaluation $PT Eval Low Complexity: 1 Low PT Treatments $Gait Training: 8-22 mins       Julien Girt, PT Acute Rehabilitation Services Pager [phone]  Office [phone]   Loreal Schuessler D Elonda Husky 10/08/2018, 5:19  PM

## 2018-10-08 NOTE — Anesthesia Procedure Notes (Signed)
Anesthesia Regional Block: Adductor canal block   Pre-Anesthetic Checklist: ,, timeout performed, Correct Patient, Correct Site, Correct Laterality, Correct Procedure, Correct Position, site marked, Risks and benefits discussed,  Surgical consent,  Pre-op evaluation,  At surgeon's request and post-op pain management  Laterality: Right  Prep: chloraprep       Needles:  Injection technique: Single-shot  Needle Type: Echogenic Stimulator Needle      Needle Gauge: 21     Additional Needles:   Narrative:  Start time: 10/08/2018 8:31 AM End time: 10/08/2018 8:41 AM Injection made incrementally with aspirations every 5 mL.  Performed by: Personally  Anesthesiologist: Lillia Abed, MD  Additional Notes: Monitors applied. Patient sedated. Sterile prep and drape,hand hygiene and sterile gloves were used. Relevant anatomy identified.Needle position confirmed.Local anesthetic injected incrementally after negative aspiration. Local anesthetic spread visualized around nerve(s). Vascular puncture avoided. No complications. Image printed for medical record.The patient tolerated the procedure well.    Lillia Abed MD

## 2018-10-08 NOTE — Care Plan (Signed)
Ortho Bundle Case Management Note  Patient Details  Name: [name] MRN: [id number] Date of Birth: 11-Jun-1941  R TKA on 10-08-2018 DCP:  Home with spouse.  Lives in a 3 story home with 3 ste and 7 steps into living space.   DME:  RW and 3-in-1 ordered through [facility] [place] PT:   [place] orders sent to [facility] for PT only x 5 visits.          OP PT scheduled at [facility] on 10-25-2018                   DME Arranged:  3-N-1, Walker rolling DME Agency:  [employer]  HH Arranged:  PT [place] Agency:  [place]  Additional Comments: Please contact me with any questions of if this plan should need to change.  Marianne Sofia, RN,CCM [facility]  [phone] 10/08/2018, 1:00 PM

## 2018-10-08 NOTE — Interval H&P Note (Signed)
History and Physical Interval Note:  10/08/2018 7:12 AM  [name]  has presented today for surgery, with the diagnosis of right knee osteoarthritis  The various methods of treatment have been discussed with the patient and family. After consideration of risks, benefits and other options for treatment, the patient has consented to  Procedure(s) with comments: RIGHT TOTAL KNEE ARTHROPLASTY (Right) - 40min as a surgical intervention .  The patient's history has been reviewed, patient examined, no change in status, stable for surgery.  I have reviewed the patient's chart and labs.  Questions were answered to the patient's satisfaction.     Pilar Plate Giulietta Prokop

## 2018-10-08 NOTE — Anesthesia Postprocedure Evaluation (Signed)
Anesthesia Post Note  Patient: MARKICE TORBERT  Procedure(s) Performed: RIGHT TOTAL KNEE ARTHROPLASTY (Right Knee)     Patient location during evaluation: PACU Anesthesia Type: Spinal Level of consciousness: oriented and awake and alert Pain management: pain level controlled Vital Signs Assessment: post-procedure vital signs reviewed and stable Respiratory status: spontaneous breathing, respiratory function stable and patient connected to nasal cannula oxygen Cardiovascular status: blood pressure returned to baseline and stable Postop Assessment: no headache, no backache and no apparent nausea or vomiting Anesthetic complications: no    Last Vitals:  Vitals:   10/08/18 1230 10/08/18 1259  BP:  (!) 149/70  Pulse: (!) 58 (!) 57  Resp: 15 15  Temp: (!) 36.4 C (!) 36.3 C  SpO2: 100% 99%    Last Pain:  Vitals:   10/08/18 1259  TempSrc: Oral  PainSc:                  Shawnette Augello DAVID

## 2018-10-08 NOTE — Op Note (Signed)
OPERATIVE REPORT-TOTAL KNEE ARTHROPLASTY   Pre-operative diagnosis- Osteoarthritis  Right knee(s)  Post-operative diagnosis- Osteoarthritis Right knee(s)  Procedure-  Right  Total Knee Arthroplasty  Surgeon- Charles Plover. Charles Schreur, MD  Assistant- Charles Duty, PA-C   Anesthesia-  Adductor canal block and spinal  EBL-30 mL   Drains Hemovac  Tourniquet time- 41 minutes @ 476 mm Hg  Complications- None  Condition-PACU - hemodynamically stable.   Brief Clinical Note  [name] is a 78 y.o. year old male with end stage OA of his right knee with progressively worsening pain and dysfunction. He has constant pain, with activity and at rest and significant functional deficits with difficulties even with ADLs. He has had extensive non-op management including analgesics, injections of cortisone and viscosupplements, and home exercise program, but remains in significant pain with significant dysfunction. Radiographs show bone on bone arthritis medial and patellofemoral. He presents now for right Total Knee Arthroplasty.    Procedure in detail---   The patient is brought into the operating room and positioned supine on the operating table. After successful administration of  Adductor canal block and spinal,   a tourniquet is placed high on the  Right thigh(s) and the lower extremity is prepped and draped in the usual sterile fashion. Time out is performed by the operating team and then the  Right lower extremity is wrapped in Esmarch, knee flexed and the tourniquet inflated to 300 mmHg.       A midline incision is made with a ten blade through the subcutaneous tissue to the level of the extensor mechanism. A fresh blade is used to make a medial parapatellar arthrotomy. Soft tissue over the proximal medial tibia is subperiosteally elevated to the joint line with a knife and into the semimembranosus bursa with a Cobb elevator. Soft tissue over the proximal lateral tibia is elevated with  attention being paid to avoiding the patellar tendon on the tibial tubercle. The patella is everted, knee flexed 90 degrees and the ACL and PCL are removed. Findings are bone on bone medial and patellofemoral with large global osteophytes.        The drill is used to create a starting hole in the distal femur and the canal is thoroughly irrigated with sterile saline to remove the fatty contents. The 5 degree Right  valgus alignment guide is placed into the femoral canal and the distal femoral cutting block is pinned to remove 10 mm off the distal femur. Resection is made with an oscillating saw.      The tibia is subluxed forward and the menisci are removed. The extramedullary alignment guide is placed referencing proximally at the medial aspect of the tibial tubercle and distally along the second metatarsal axis and tibial crest. The block is pinned to remove 52mm off the more deficient medial  side. Resection is made with an oscillating saw. Size 8is the most appropriate size for the tibia and the proximal tibia is prepared with the modular drill and keel punch for that size.      The femoral sizing guide is placed and size 9 is most appropriate. Rotation is marked off the epicondylar axis and confirmed by creating a rectangular flexion gap at 90 degrees. The size 9 cutting block is pinned in this rotation and the anterior, posterior and chamfer cuts are made with the oscillating saw. The intercondylar block is then placed and that cut is made.      Trial size 8 tibial component, trial size 9 posterior  stabilized femur and a 8  mm posterior stabilized rotating platform insert trial is placed. Full extension is achieved with excellent varus/valgus and anterior/posterior balance throughout full range of motion. The patella is everted and thickness measured to be 27  mm. Free hand resection is taken to 15 mm, a 41 template is placed, lug holes are drilled, trial patella is placed, and it tracks normally.  Osteophytes are removed off the posterior femur with the trial in place. All trials are removed and the cut bone surfaces prepared with pulsatile lavage. Cement is mixed and once ready for implantation, the size 8 tibial implant, size  9 posterior stabilized femoral component, and the size 41 patella are cemented in place and the patella is held with the clamp. The trial insert is placed and the knee held in full extension. The Exparel (20 ml mixed with 60 ml saline) is injected into the extensor mechanism, posterior capsule, medial and lateral gutters and subcutaneous tissues.  All extruded cement is removed and once the cement is hard the permanent 8 mm posterior stabilized rotating platform insert is placed into the tibial tray.      The wound is copiously irrigated with saline solution and the extensor mechanism closed over a hemovac drain with #1 V-loc suture. The tourniquet is released for a total tourniquet time of 41  minutes. Flexion against gravity is 140 degrees and the patella tracks normally. Subcutaneous tissue is closed with 2.0 vicryl and subcuticular with running 4.0 Monocryl. The incision is cleaned and dried and steri-strips and a bulky sterile dressing are applied. The limb is placed into a knee immobilizer and the patient is awakened and transported to recovery in stable condition.      Please note that a surgical assistant was a medical necessity for this procedure in order to perform it in a safe and expeditious manner. Surgical assistant was necessary to retract the ligaments and vital neurovascular structures to prevent injury to them and also necessary for proper positioning of the limb to allow for anatomic placement of the prosthesis.   Charles Plover Shandy Vi, MD    10/08/2018, 10:35 AM

## 2018-10-09 ENCOUNTER — Encounter (HOSPITAL_COMMUNITY): Payer: Self-pay | Admitting: Orthopedic Surgery

## 2018-10-09 DIAGNOSIS — M1711 Unilateral primary osteoarthritis, right knee: Secondary | ICD-10-CM | POA: Diagnosis not present

## 2018-10-09 DIAGNOSIS — Z96651 Presence of right artificial knee joint: Secondary | ICD-10-CM | POA: Diagnosis not present

## 2018-10-09 LAB — CBC
HCT: 32.5 % — ABNORMAL LOW (ref 39.0–52.0)
Hemoglobin: 10.6 g/dL — ABNORMAL LOW (ref 13.0–17.0)
MCH: 34 pg (ref 26.0–34.0)
MCHC: 32.6 g/dL (ref 30.0–36.0)
MCV: 104.2 fL — ABNORMAL HIGH (ref 80.0–100.0)
NRBC: 0.3 % — AB (ref 0.0–0.2)
PLATELETS: 605 10*3/uL — AB (ref 150–400)
RBC: 3.12 MIL/uL — ABNORMAL LOW (ref 4.22–5.81)
RDW: 16.2 % — ABNORMAL HIGH (ref 11.5–15.5)
WBC: 9.8 10*3/uL (ref 4.0–10.5)

## 2018-10-09 LAB — BASIC METABOLIC PANEL
Anion gap: 8 (ref 5–15)
BUN: 22 mg/dL (ref 8–23)
CO2: 26 mmol/L (ref 22–32)
Calcium: 8.6 mg/dL — ABNORMAL LOW (ref 8.9–10.3)
Chloride: 99 mmol/L (ref 98–111)
Creatinine, Ser: 0.93 mg/dL (ref 0.61–1.24)
GFR calc Af Amer: >60 mL/min (ref >60)
Glucose, Bld: 365 mg/dL — ABNORMAL HIGH (ref 70–99)
Potassium: 4.5 mmol/L (ref 3.5–5.1)
Sodium: 133 mmol/L — ABNORMAL LOW (ref 135–145)

## 2018-10-09 LAB — GLUCOSE, CAPILLARY
Glucose-Capillary: 326 mg/dL — ABNORMAL HIGH (ref 70–99)
Glucose-Capillary: 321 mg/dL — ABNORMAL HIGH (ref 70–99)
Glucose-Capillary: 300 mg/dL — ABNORMAL HIGH (ref 70–99)
Glucose-Capillary: 321 mg/dL — ABNORMAL HIGH (ref 70–99)

## 2018-10-09 NOTE — Progress Notes (Signed)
Physical Therapy Treatment Patient Details Name: [name] MRN: [id number] DOB: 1941/06/04 Today's Date: 10/09/2018    History of Present Illness 78 yo male s/p R TKR on 10/08/18. PMH includes diabetes with neuropathy and retinopathy, morbid obesity, L MCA, HLD, HTN, anxiety, CHF, PAF, OSA, GERD, skin cancer, depression, PVD, back surgery, L TKR.     PT Comments    Patient seen for mobility progression and exercise instruction. Patient requiring Min A +2 for sit to stand at bedside with cueing for hand placement and safety. Good progression of mobility with RW with patient demonstrating step to pattern with reduced weight shift to R LE due to pain - continues to reports decreased sensation at B LE increasing his fall risk. Will continue to progress towards goals.  Will plan to navigate stairs at next session.    Follow Up Recommendations  Follow surgeon's recommendation for DC plan and follow-up therapies;Supervision for mobility/OOB     Equipment Recommendations  Rolling walker with 5" wheels;3in1 (PT)    Recommendations for Other Services       Precautions / Restrictions Precautions Precautions: Fall Required Braces or Orthoses: Knee Immobilizer - Right Knee Immobilizer - Right: Discontinue once straight leg raise with < 10 degree lag;On when out of bed or walking Restrictions Weight Bearing Restrictions: No Other Position/Activity Restrictions: WBAT     Mobility  Bed Mobility               General bed mobility comments: sitting EOB upon PT arrival  Transfers Overall transfer level: Needs assistance Equipment used: Rolling walker (2 wheeled) Transfers: Sit to/from [employer] Transfers Sit to Stand: Min assist;+2 physical assistance;From elevated surface Stand pivot transfers: Min assist;+2 physical assistance       General transfer comment: MIn A to power up at bedside; patient requiring verbal cueing for hand placement and  safety  Ambulation/Gait Ambulation/Gait assistance: Min assist;Min guard;+2 safety/equipment Gait Distance (Feet): 60 Feet Assistive device: Rolling walker (2 wheeled) Gait Pattern/deviations: Step-to pattern;Decreased stride length;Decreased weight shift to right;Antalgic Gait velocity: decreased   General Gait Details: antalgic gait pattern with PT cueing for heel strike and step through; patient continues to report limited sensation at B LE   Stairs             Wheelchair Mobility    Modified Rankin (Stroke Patients Only)       Balance Overall balance assessment: Needs assistance;History of Falls Sitting-balance support: No upper extremity supported;Feet supported Sitting balance-Leahy Scale: Fair     Standing balance support: Bilateral upper extremity supported;During functional activity Standing balance-Leahy Scale: Poor Standing balance comment: heavy reliance on RW for support                            Cognition Arousal/Alertness: Awake/alert Behavior During Therapy: WFL for tasks assessed/performed Overall Cognitive Status: History of cognitive impairments - at baseline                                 General Comments: Per RN and NT, pt's son states pt has become more forgetful recently and requires frequent reminders.       Exercises Total Joint Exercises Ankle Circles/Pumps: AROM;Both;20 reps;Seated Quad Sets: AROM;Right;20 reps;Seated Gluteal Sets: AROM;Both;10 reps;Seated Hip ABduction/ADduction: AROM;Right;10 reps;Seated Straight Leg Raises: AAROM;Right;10 reps;Seated Long Arc Quad: AROM;Right;10 reps;Seated    General Comments  Pertinent Vitals/Pain Pain Assessment: 0-10 Pain Score: 3  Pain Location: R knee  Pain Descriptors / Indicators: Aching;Grimacing;Guarding Pain Intervention(s): Limited activity within patient's tolerance;Monitored during session;Repositioned    Home Living                       Prior Function            PT Goals (current goals can now be found in the care plan section) Acute Rehab PT Goals Patient Stated Goal: be more steady  PT Goal Formulation: With patient Time For Goal Achievement: 10/15/18 Potential to Achieve Goals: Good Progress towards PT goals: Progressing toward goals    Frequency    7X/week      PT Plan Current plan remains appropriate    Co-evaluation              AM-PAC PT "6 Clicks" Mobility   Outcome Measure  Help needed turning from your back to your side while in a flat bed without using bedrails?: A Little Help needed moving from lying on your back to sitting on the side of a flat bed without using bedrails?: A Little Help needed moving to and from a bed to a chair (including a wheelchair)?: A Little Help needed standing up from a chair using your arms (e.g., wheelchair or bedside chair)?: A Lot Help needed to walk in hospital room?: A Little Help needed climbing 3-5 steps with a railing? : A Lot 6 Click Score: 16    End of Session Equipment Utilized During Treatment: Gait belt Activity Tolerance: Patient tolerated treatment well Patient left: in chair;with call bell/phone within reach;with chair alarm set Nurse Communication: Mobility status PT Visit Diagnosis: Other abnormalities of gait and mobility (R26.89);Difficulty in walking, not elsewhere classified (R26.2)     Time: 5625-6389 PT Time Calculation (min) (ACUTE ONLY): 25 min  Charges:  $Gait Training: 8-22 mins $Therapeutic Exercise: 8-22 mins                      Lanney Gins, PT, DPT Supplemental Physical Therapist 10/09/18 10:40 AM Pager: [phone] Office: [phone]

## 2018-10-09 NOTE — Progress Notes (Signed)
Physical Therapy Treatment Patient Details Name: [name] MRN: [id number] DOB: [birth year] Today's Date: 10/09/2018    History of Present Illness 78 yo male s/p R TKR on 10/08/18. PMH includes diabetes with neuropathy and retinopathy, morbid obesity, L MCA, HLD, HTN, anxiety, CHF, PAF, OSA, GERD, skin cancer, depression, PVD, back surgery, L TKR.     PT Comments    Patient seen for mobility progress and stair navigation instruction. Patient with improved transfers, however does continue to require cueing for safety and sequencing. Patient navigating up/down 2/3 steps with single L sided handrail with Min A/min guard for safety to simulate home environment - cueing for sequencing with handout given for carryover. Making good progress towards goals.    Follow Up Recommendations  Follow surgeon's recommendation for DC plan and follow-up therapies;Supervision for mobility/OOB     Equipment Recommendations  Rolling walker with 5" wheels;3in1 (PT)    Recommendations for Other Services       Precautions / Restrictions Precautions Precautions: Fall Required Braces or Orthoses: Knee Immobilizer - Right Knee Immobilizer - Right: Discontinue once straight leg raise with < 10 degree lag;On when out of bed or walking Restrictions Weight Bearing Restrictions: No Other Position/Activity Restrictions: WBAT     Mobility  Bed Mobility Overal bed mobility: Needs Assistance Bed Mobility: Sit to Supine       Sit to supine: Min assist   General bed mobility comments: Min A for LE management  Transfers Overall transfer level: Needs assistance Equipment used: Rolling walker (2 wheeled) Transfers: Sit to/from [employer] Sit to Stand: Min assist;+2 physical assistance;From elevated surface Stand pivot transfers: Min assist;+2 physical assistance       General transfer comment: Min A to power up at bedside - improved from morning session; cueing for hand  placement  Ambulation/Gait Ambulation/Gait assistance: Min assist;Min guard;+2 safety/equipment Gait Distance (Feet): 50 Feet Assistive device: Rolling walker (2 wheeled) Gait Pattern/deviations: Step-to pattern;Decreased stride length;Decreased weight shift to right;Antalgic Gait velocity: decreased   General Gait Details: antalgic gait pattern - cueing for safety as patient gets very close to front of walker increasing his instability; cueing for heel strike   Stairs Stairs: Yes Stairs assistance: Min assist;+2 safety/equipment Stair Management: One rail Left;Step to pattern;Sideways;Forwards Number of Stairs: 2(2 sets) General stair comments: up/down sideways with single handrail to simulate home environment   Wheelchair Mobility    Modified Rankin (Stroke Patients Only)       Balance Overall balance assessment: Needs assistance;History of Falls Sitting-balance support: No upper extremity supported;Feet supported Sitting balance-Leahy Scale: Fair     Standing balance support: Bilateral upper extremity supported;During functional activity Standing balance-Leahy Scale: Poor Standing balance comment: heavy reliance on RW for support                            Cognition Arousal/Alertness: Awake/alert Behavior During Therapy: WFL for tasks assessed/performed Overall Cognitive Status: History of cognitive impairments - at baseline                                 General Comments: Per RN and NT, pt's son states pt has become more forgetful recently and requires frequent reminders.       Exercises Total Joint Exercises Ankle Circles/Pumps: AROM;Both;20 reps;Seated Quad Sets: AROM;Right;10 reps;Supine Hip ABduction/ADduction: AROM;Right;10 reps;Supine Straight Leg Raises: AAROM;Right;10 reps;Supine Long Arc Quad: AROM;Right;10 reps;Seated  General Comments        Pertinent Vitals/Pain Pain Assessment: 0-10 Pain Score: 4  Pain  Location: R knee  Pain Descriptors / Indicators: Aching;Grimacing;Guarding Pain Intervention(s): Limited activity within patient's tolerance;Monitored during session;Repositioned;Ice applied    Home Living                      Prior Function            PT Goals (current goals can now be found in the care plan section) Acute Rehab PT Goals Patient Stated Goal: be more steady  PT Goal Formulation: With patient Time For Goal Achievement: 10/15/18 Potential to Achieve Goals: Good Progress towards PT goals: Progressing toward goals    Frequency    7X/week      PT Plan Current plan remains appropriate    Co-evaluation              AM-PAC PT "6 Clicks" Mobility   Outcome Measure  Help needed turning from your back to your side while in a flat bed without using bedrails?: A Little Help needed moving from lying on your back to sitting on the side of a flat bed without using bedrails?: A Little Help needed moving to and from a bed to a chair (including a wheelchair)?: A Little Help needed standing up from a chair using your arms (e.g., wheelchair or bedside chair)?: A Lot Help needed to walk in hospital room?: A Little Help needed climbing 3-5 steps with a railing? : A Lot 6 Click Score: 16    End of Session Equipment Utilized During Treatment: Gait belt Activity Tolerance: Patient tolerated treatment well Patient left: with call bell/phone within reach;in bed Nurse Communication: Mobility status PT Visit Diagnosis: Other abnormalities of gait and mobility (R26.89);Difficulty in walking, not elsewhere classified (R26.2)     Time: 7544-9201 PT Time Calculation (min) (ACUTE ONLY): 31 min  Charges:  $Gait Training: 8-22 mins $Therapeutic Exercise: 8-22 mins                      Lanney Gins, PT, DPT Supplemental Physical Therapist 10/09/18 3:45 PM Pager: [phone] Office: [phone]

## 2018-10-09 NOTE — Progress Notes (Signed)
   Subjective: 1 Day Post-Op Procedure(s) (LRB): RIGHT TOTAL KNEE ARTHROPLASTY (Right) Patient reports pain as moderate.   Patient seen in rounds by Dr. Wynelle Link. Patient is well, and has had no acute complaints or problems other than pain in the right knee. No issues overnight. Foley catheter removed this AM. Denies chest pain or SOB. We will continue therapy today.   Objective: Vital signs in last 24 hours: Temp:  [96.3 F (35.7 C)-100.2 F (37.9 C)] 99 F (37.2 C) (01/28 0617) Pulse Rate:  [50-142] 70 (01/28 0617) Resp:  [7-18] 18 (01/28 0617) BP: (117-220)/(53-139) 146/62 (01/28 0617) SpO2:  [93 %-100 %] 99 % (01/28 0617) Weight:  [003 kg] 127 kg (01/27 0717)  Intake/Output from previous day:  Intake/Output Summary (Last 24 hours) at 10/09/2018 0712 Last data filed at 10/09/2018 0622 Gross per 24 hour  Intake 3780.7 ml  Output 3120 ml  Net 660.7 ml    Labs: Recent Labs    10/09/18 0612  HGB 10.6*   Recent Labs    10/09/18 0612  WBC 9.8  RBC 3.12*  HCT 32.5*  PLT 605*   Recent Labs    10/09/18 0612  NA 133*  K 4.5  CL 99  CO2 26  BUN 22  CREATININE 0.93  GLUCOSE 365*  CALCIUM 8.6*   Exam: General - Patient is Alert and Oriented Extremity - Neurologically intact Neurovascular intact Sensation intact distally Dorsiflexion/Plantar flexion intact Dressing - dressing C/D/I Motor Function - intact, moving foot and toes well on exam.   Past Medical History:  Diagnosis Date  . Anxiety   . Arthritis   . Cancer (HCC)    skin - basil cell  . Depression   . Diabetes mellitus without complication ([place])   . Dysrhythmia    a-fib  . GERD (gastroesophageal reflux disease)   . Hyperlipidemia   . Hypertension   . Neuropathy   . Obesity   . Paroxysmal atrial fibrillation (HCC)   . Peripheral vascular disease ([place])    diabetic neuropathy in both feet  . Sleep apnea    uses C-pap machine  . Stroke ([place])    08/09/2017    Assessment/Plan: 1 Day  Post-Op Procedure(s) (LRB): RIGHT TOTAL KNEE ARTHROPLASTY (Right) Principal Problem:   OA (osteoarthritis) of knee  Estimated body mass index is 37.97 kg/m as calculated from the following:   Height as of this encounter: 6' (1.829 m).   Weight as of this encounter: 127 kg. Advance diet Up with therapy  Anticipated LOS equal to or greater than 2 midnights due to - Age 9 and older with one or more of the following:  - Obesity  - Expected need for hospital services (PT, OT, Nursing) required for safe  discharge  - Anticipated need for postoperative skilled nursing care or inpatient rehab  - Active co-morbidities: Diabetes, Stroke and Cardiac Arrhythmia OR   - Unanticipated findings during/Post Surgery: None  - Patient is a high risk of re-admission due to: None    DVT Prophylaxis - Eliquis Weight bearing as tolerated. D/C O2 and pulse ox and try on room air. Hemovac pulled without difficulty, will continue therapy today.  Plan is to go Home after hospital stay. Possible discharge tomorrow pending progress with therapy.  Theresa Duty, PA-C Orthopedic Surgery 10/09/2018, 7:12 AM

## 2018-10-10 DIAGNOSIS — M1711 Unilateral primary osteoarthritis, right knee: Secondary | ICD-10-CM | POA: Diagnosis not present

## 2018-10-10 LAB — BASIC METABOLIC PANEL
Anion gap: 6 (ref 5–15)
BUN: 24 mg/dL — ABNORMAL HIGH (ref 8–23)
CO2: 30 mmol/L (ref 22–32)
Calcium: 8.7 mg/dL — ABNORMAL LOW (ref 8.9–10.3)
Chloride: 98 mmol/L (ref 98–111)
Creatinine, Ser: 0.98 mg/dL (ref 0.61–1.24)
GFR calc Af Amer: >60 mL/min (ref >60)
GFR calc non Af Amer: >60 mL/min (ref >60)
GLUCOSE: 324 mg/dL — AB (ref 70–99)
Potassium: 4.1 mmol/L (ref 3.5–5.1)
Sodium: 134 mmol/L — ABNORMAL LOW (ref 135–145)

## 2018-10-10 LAB — CBC
HCT: 31 % — ABNORMAL LOW (ref 39.0–52.0)
Hemoglobin: 10.3 g/dL — ABNORMAL LOW (ref 13.0–17.0)
MCH: 34.7 pg — AB (ref 26.0–34.0)
MCHC: 33.2 g/dL (ref 30.0–36.0)
MCV: 104.4 fL — AB (ref 80.0–100.0)
Platelets: 485 10*3/uL — ABNORMAL HIGH (ref 150–400)
RBC: 2.97 MIL/uL — ABNORMAL LOW (ref 4.22–5.81)
RDW: 16.4 % — ABNORMAL HIGH (ref 11.5–15.5)
WBC: 14.8 10*3/uL — ABNORMAL HIGH (ref 4.0–10.5)
nRBC: 1 % — ABNORMAL HIGH (ref 0.0–0.2)

## 2018-10-10 LAB — GLUCOSE, CAPILLARY
Glucose-Capillary: 298 mg/dL — ABNORMAL HIGH (ref 70–99)
Glucose-Capillary: 385 mg/dL — ABNORMAL HIGH (ref 70–99)
Glucose-Capillary: 380 mg/dL — ABNORMAL HIGH (ref 70–99)
Glucose-Capillary: 161 mg/dL — ABNORMAL HIGH (ref 70–99)

## 2018-10-10 MED ORDER — TRAMADOL HCL 50 MG PO TABS: 50.0000 mg | ORAL_TABLET | Freq: Four times a day (QID) | ORAL | 0 refills | Status: DC | PRN

## 2018-10-10 MED ORDER — METHOCARBAMOL 500 MG PO TABS: 500.0000 mg | ORAL_TABLET | Freq: Four times a day (QID) | ORAL | 0 refills | Status: DC | PRN

## 2018-10-10 MED ORDER — OXYCODONE HCL 5 MG PO TABS: 5.0000 mg | ORAL_TABLET | Freq: Four times a day (QID) | ORAL | 0 refills | Status: DC | PRN

## 2018-10-10 NOTE — Progress Notes (Signed)
   Subjective: 2 Days Post-Op Procedure(s) (LRB): RIGHT TOTAL KNEE ARTHROPLASTY (Right) Patient reports pain as mild.   Patient seen in rounds for Dr. Wynelle Link. Patient is well, and has had no acute complaints or problems other than pain in the right knee. Voiding without difficulty and positive flatus. Denies chest pain, SOB, or calf pain. No issues overnight.  Plan is to go Home after hospital stay.  Objective: Vital signs in last 24 hours: Temp:  [97.5 F (36.4 C)-99.6 F (37.6 C)] 99.6 F (37.6 C) (01/29 0308) Pulse Rate:  [61-96] 83 (01/29 0308) Resp:  [16-18] 18 (01/29 0308) BP: (136-160)/(61-76) 146/76 (01/29 0308) SpO2:  [89 %-93 %] 90 % (01/29 0308)  Intake/Output from previous day:  Intake/Output Summary (Last 24 hours) at 10/10/2018 0734 Last data filed at 10/10/2018 0301 Gross per 24 hour  Intake 880.66 ml  Output 2475 ml  Net -1594.34 ml    Labs: Recent Labs    10/09/18 0612 10/10/18 0547  HGB 10.6* 10.3*   Recent Labs    10/09/18 0612 10/10/18 0547  WBC 9.8 14.8*  RBC 3.12* 2.97*  HCT 32.5* 31.0*  PLT 605* 485*   Recent Labs    10/09/18 0612 10/10/18 0547  NA 133* 134*  K 4.5 4.1  CL 99 98  CO2 26 30  BUN 22 24*  CREATININE 0.93 0.98  GLUCOSE 365* 324*  CALCIUM 8.6* 8.7*   Exam: General - Patient is Alert and Oriented Extremity - Neurologically intact Neurovascular intact Sensation intact distally Dorsiflexion/Plantar flexion intact Dressing/Incision - clean, dry, no drainage Motor Function - intact, moving foot and toes well on exam.   Past Medical History:  Diagnosis Date  . Anxiety   . Arthritis   . Cancer (HCC)    skin - basil cell  . Depression   . Diabetes mellitus without complication ([place])   . Dysrhythmia    a-fib  . GERD (gastroesophageal reflux disease)   . Hyperlipidemia   . Hypertension   . Neuropathy   . Obesity   . Paroxysmal atrial fibrillation (HCC)   . Peripheral vascular disease ([place])    diabetic  neuropathy in both feet  . Sleep apnea    uses C-pap machine  . Stroke ([place])    08/09/2017    Assessment/Plan: 2 Days Post-Op Procedure(s) (LRB): RIGHT TOTAL KNEE ARTHROPLASTY (Right) Principal Problem:   OA (osteoarthritis) of knee  Estimated body mass index is 37.97 kg/m as calculated from the following:   Height as of this encounter: 6' (1.829 m).   Weight as of this encounter: 127 kg. Up with therapy D/C IV fluids  DVT Prophylaxis - Eliquis Weight-bearing as tolerated  Plan for discharge later today after two sessions of therapy as long as meeting goals. Scheduled for HHPT followed by outpatient at [facility]. Follow-up in the office in 2 weeks.   Theresa Duty, PA-C Orthopedic Surgery 10/10/2018, 7:34 AM

## 2018-10-10 NOTE — Progress Notes (Signed)
Physical Therapy Treatment Patient Details Name: [name] MRN: [id number] DOB: 06/17/1941 Today's Date: 10/10/2018    History of Present Illness 78 yo male s/p R TKR on 10/08/18. PMH includes diabetes with neuropathy and retinopathy, morbid obesity, L MCA, HLD, HTN, anxiety, CHF, PAF, OSA, GERD, skin cancer, depression, PVD, back surgery, L TKR.     PT Comments    Patient seen to progress mobility with emphasis on increasing safety and independence with transfers, gait and stair navigation. Continues to require cueing for hand placement for transfers with Mod A +2 required to power up from bedside. Patient navigating up/down 2-3 steps with single L sided handrail with cueing for sequencing and safety - increased physical assist required today for safety. Recommend patient be seen by  PT this afternoon to continue to progress safe mobility prior to returning home.   Will likely require assist at home with stair navigation for safety.     Follow Up Recommendations  Follow surgeon's recommendation for DC plan and follow-up therapies;Supervision for mobility/OOB     Equipment Recommendations  Rolling walker with 5" wheels;3in1 (PT)    Recommendations for Other Services       Precautions / Restrictions Precautions Precautions: Fall Required Braces or Orthoses: Knee Immobilizer - Right Knee Immobilizer - Right: Discontinue once straight leg raise with < 10 degree lag;On when out of bed or walking Restrictions Weight Bearing Restrictions: No Other Position/Activity Restrictions: WBAT     Mobility  Bed Mobility Overal bed mobility: Needs Assistance Bed Mobility: Supine to Sit     Supine to sit: Min assist     General bed mobility comments: encouraging patient to hook non-operative LE under operative to self aide with bed mobility- increased time and effort with use of bed rails  Transfers Overall transfer level: Needs assistance Equipment used: Rolling walker (2  wheeled) Transfers: Sit to/from [facility] Sit to Stand: Mod assist;+2 physical assistance;From elevated surface Stand pivot transfers: Min assist       General transfer comment: Mod A to power up at bedside with cueing for hand placement to maximize efficiency and safety; Min A for stnad pivot for cueing and safety  Ambulation/Gait Ambulation/Gait assistance: Min assist;Min guard;+2 safety/equipment Gait Distance (Feet): 40 Feet Assistive device: Rolling walker (2 wheeled) Gait Pattern/deviations: Step-to pattern;Decreased stride length;Decreased weight shift to right;Decreased stance time - right;Antalgic Gait velocity: decreased   General Gait Details: antalgic gait patternw tih noted flexed posture - cueing to increased R knee extension during stance phase of gait   Stairs Stairs: Yes Stairs assistance: Min assist;+2 physical assistance Stair Management: One rail Left;Step to pattern;Sideways;Forwards Number of Stairs: 2(2 trials) General stair comments: up/down sideways with single handrail to simulate home environment; consistent verbal cueing for safety and sequencing - patient requiring increased assist compared to yesterday; cueing to increase R knee extension during stance phase.    Wheelchair Mobility    Modified Rankin (Stroke Patients Only)       Balance Overall balance assessment: Needs assistance;History of Falls Sitting-balance support: No upper extremity supported;Feet supported Sitting balance-Leahy Scale: Fair     Standing balance support: Bilateral upper extremity supported;During functional activity Standing balance-Leahy Scale: Poor Standing balance comment: heavy reliance on RW for support                            Cognition Arousal/Alertness: Awake/alert Behavior During Therapy: WFL for tasks assessed/performed Overall Cognitive Status: History of cognitive  impairments - at baseline                                  General Comments: Per RN and NT, pt's son states pt has become more forgetful recently and requires frequent reminders.       Exercises      General Comments        Pertinent Vitals/Pain Pain Assessment: 0-10 Pain Score: 3  Pain Location: R knee  Pain Descriptors / Indicators: Aching;Grimacing;Guarding Pain Intervention(s): Limited activity within patient's tolerance;Monitored during session;Repositioned;Ice applied    Home Living                      Prior Function            PT Goals (current goals can now be found in the care plan section) Acute Rehab PT Goals Patient Stated Goal: be more steady  PT Goal Formulation: With patient Time For Goal Achievement: 10/15/18 Potential to Achieve Goals: Good Progress towards PT goals: Progressing toward goals    Frequency    7X/week      PT Plan Current plan remains appropriate    Co-evaluation              AM-PAC PT "6 Clicks" Mobility   Outcome Measure  Help needed turning from your back to your side while in a flat bed without using bedrails?: A Little Help needed moving from lying on your back to sitting on the side of a flat bed without using bedrails?: A Little Help needed moving to and from a bed to a chair (including a wheelchair)?: A Lot Help needed standing up from a chair using your arms (e.g., wheelchair or bedside chair)?: A Lot Help needed to walk in hospital room?: A Little Help needed climbing 3-5 steps with a railing? : A Lot 6 Click Score: 15    End of Session Equipment Utilized During Treatment: Gait belt Activity Tolerance: Patient tolerated treatment well Patient left: in chair;with call bell/phone within reach Nurse Communication: Mobility status PT Visit Diagnosis: Other abnormalities of gait and mobility (R26.89);Difficulty in walking, not elsewhere classified (R26.2)     Time: 0347-4259 PT Time Calculation (min) (ACUTE ONLY): 31 min  Charges:  $Gait  Training: 8-22 mins $Therapeutic Activity: 8-22 mins                      Lanney Gins, PT, DPT Supplemental Physical Therapist 10/10/18 10:51 AM Pager: [phone] Office: [phone]

## 2018-10-10 NOTE — Progress Notes (Signed)
Patient ambulated in room. Vitals stable. Pain controlled.

## 2018-10-10 NOTE — Care Management Note (Signed)
Case Management Note  Patient Details  Name: [name] MRN: [id number] Date of Birth: 1941-01-18  Subjective/Objective:     Ortho bundle originally arranged with [facility] for [facility] services. They were unable to accept.                Action/Plan: Contacted office CM, she has now arranged [place] with [facility], they have accepted the referral. [facility] delivered RW per order.  Expected Discharge Date:  10/10/18               Expected Discharge Plan:  [place]  In-House Referral:  NA  Discharge planning Services  CM Consult  Post Acute Care Choice:  Home Health, Durable Medical Equipment Choice offered to:  Patient  DME Arranged:  3-N-1, Walker rolling DME Agency:  [facility]  HH Arranged:  PT [place] Agency:  [place]  Status of Service:  Completed, signed off  If discussed at [place] of Stay Meetings, dates discussed:    Additional Comments:  Guadalupe Maple, RN 10/10/2018, 11:03 AM

## 2018-10-10 NOTE — Progress Notes (Signed)
Physical Therapy Treatment Patient Details Name: [name] MRN: [id number] DOB: 04-28-1941 Today's Date: 10/10/2018    History of Present Illness 78 yo male s/p R TKR on 10/08/18. PMH includes diabetes with neuropathy and retinopathy, morbid obesity, L MCA, HLD, HTN, anxiety, CHF, PAF, OSA, GERD, skin cancer, depression, PVD, back surgery, L TKR.     PT Comments    Continued focus on functional mobility with RW. Patient requires consistent verbal cueing throughout session for safety and sequencing of gait with RW as well as during transfers and up/down steps. Patient with limited carryover of safety cues even within session with inability to teach back to PT aspects of stair navigation. Patient demonstrating high fall risk due to history of neuropathy, limited weight acceptance onto surgical LE, poor safety awareness, history of falls. Trial of steps with and without knee immobilizer with safety concerns throughout - without immobilizer unable to maintain knee in extension to support weight in stance phase, and with immobilizer unable to clear foot on steps requiring physical assist from PT to pick up foot.    Due to patients current functional deficits with limited caregiver support as well as required stair navigation to enter living quarters, PT does not recommend return home on this date.  PT speaking with case manager at [facility], with her to contact MD. PT to continue to follow.    Follow Up Recommendations  Follow surgeon's recommendation for DC plan and follow-up therapies;Supervision for mobility/OOB     Equipment Recommendations  Rolling walker with 5" wheels;3in1 (PT)    Recommendations for Other Services       Precautions / Restrictions Precautions Precautions: Fall Required Braces or Orthoses: Knee Immobilizer - Right Knee Immobilizer - Right: Discontinue once straight leg raise with < 10 degree lag;On when out of bed or walking Restrictions Weight Bearing  Restrictions: No Other Position/Activity Restrictions: WBAT     Mobility  Bed Mobility Overal bed mobility: Needs Assistance Bed Mobility: Supine to Sit     Supine to sit: Min assist     General bed mobility comments: cueing to hook non-operative leg under operative LE - patient elevating HOB and utilizing bed rails - encourages patient to not use as he does not have this option at home  Transfers Overall transfer level: Needs assistance Equipment used: Rolling walker (2 wheeled) Transfers: Sit to/from [facility] Sit to Stand: Mod assist;+2 physical assistance;From elevated surface Stand pivot transfers: Min assist       General transfer comment: Mod A +2 from elevated bed height and from recliner; continued cueing for hand placement and sequencing with poor carryover  Ambulation/Gait Ambulation/Gait assistance: Min assist;Min guard;+2 safety/equipment Gait Distance (Feet): 60 Feet Assistive device: Rolling walker (2 wheeled) Gait Pattern/deviations: Step-to pattern;Decreased stride length;Decreased weight shift to right;Decreased stance time - right;Antalgic Gait velocity: decreased   General Gait Details: patient with poor safety awareness - tendency to step through front of RW increasing his fall risk and overall instability - even with cueing poor carryover   Stairs Stairs: Yes Stairs assistance: Min assist;Mod assist;+2 physical assistance Stair Management: One rail Left;Step to pattern;Sideways;Forwards Number of Stairs: 2(2 sets) General stair comments: use of knee immobilizer vs non-use - patient with poor weight acceptance onto R LE needed during stance phase with steps requiring up to Mod A for stability; even with multiple trials of stair navigation patient continues to present with confusion on sequencing and unable to teach back to PT   Wheelchair Mobility  Modified Rankin (Stroke Patients Only)       Balance Overall balance  assessment: Needs assistance;History of Falls Sitting-balance support: No upper extremity supported;Feet supported Sitting balance-Leahy Scale: Fair     Standing balance support: Bilateral upper extremity supported;During functional activity Standing balance-Leahy Scale: Poor Standing balance comment: heavy reliance on RW for support                            Cognition Arousal/Alertness: Awake/alert Behavior During Therapy: WFL for tasks assessed/performed Overall Cognitive Status: History of cognitive impairments - at baseline                                 General Comments: Per RN and NT, pt's son states pt has become more forgetful recently and requires frequent reminders.       Exercises Total Joint Exercises Ankle Circles/Pumps: AROM;Both;10 reps;Supine Quad Sets: AROM;Right;10 reps;Seated    General Comments        Pertinent Vitals/Pain Pain Assessment: 0-10 Pain Score: 4  Pain Location: R knee  Pain Descriptors / Indicators: Aching;Grimacing;Guarding Pain Intervention(s): Limited activity within patient's tolerance;Monitored during session;Repositioned;Ice applied    Home Living                      Prior Function            PT Goals (current goals can now be found in the care plan section) Acute Rehab PT Goals Patient Stated Goal: be more steady  PT Goal Formulation: With patient Time For Goal Achievement: 10/15/18 Potential to Achieve Goals: Good Progress towards PT goals: Progressing toward goals    Frequency    7X/week      PT Plan Current plan remains appropriate    Co-evaluation              AM-PAC PT "6 Clicks" Mobility   Outcome Measure  Help needed turning from your back to your side while in a flat bed without using bedrails?: A Little Help needed moving from lying on your back to sitting on the side of a flat bed without using bedrails?: A Little Help needed moving to and from a bed to a  chair (including a wheelchair)?: A Lot Help needed standing up from a chair using your arms (e.g., wheelchair or bedside chair)?: A Lot Help needed to walk in hospital room?: A Little Help needed climbing 3-5 steps with a railing? : A Lot 6 Click Score: 15    End of Session Equipment Utilized During Treatment: Gait belt Activity Tolerance: Patient tolerated treatment well Patient left: in chair;with call bell/phone within reach Nurse Communication: Mobility status PT Visit Diagnosis: Other abnormalities of gait and mobility (R26.89);Difficulty in walking, not elsewhere classified (R26.2)     Time: 2263-3354 PT Time Calculation (min) (ACUTE ONLY): 37 min  Charges:  $Gait Training: 23-37 mins                     Lanney Gins, PT, DPT Supplemental Physical Therapist 10/10/18 3:00 PM Pager: [phone] Office: [phone]

## 2018-10-11 DIAGNOSIS — M1711 Unilateral primary osteoarthritis, right knee: Secondary | ICD-10-CM | POA: Diagnosis not present

## 2018-10-11 LAB — CBC
HCT: 32.7 % — ABNORMAL LOW (ref 39.0–52.0)
HEMOGLOBIN: 10.7 g/dL — AB (ref 13.0–17.0)
MCH: 34.3 pg — ABNORMAL HIGH (ref 26.0–34.0)
MCHC: 32.7 g/dL (ref 30.0–36.0)
MCV: 104.8 fL — ABNORMAL HIGH (ref 80.0–100.0)
Platelets: 493 10*3/uL — ABNORMAL HIGH (ref 150–400)
RBC: 3.12 MIL/uL — ABNORMAL LOW (ref 4.22–5.81)
RDW: 16.5 % — ABNORMAL HIGH (ref 11.5–15.5)
WBC: 13.4 10*3/uL — ABNORMAL HIGH (ref 4.0–10.5)
nRBC: 0.5 % — ABNORMAL HIGH (ref 0.0–0.2)

## 2018-10-11 LAB — GLUCOSE, CAPILLARY
Glucose-Capillary: 215 mg/dL — ABNORMAL HIGH (ref 70–99)
Glucose-Capillary: 243 mg/dL — ABNORMAL HIGH (ref 70–99)

## 2018-10-11 NOTE — Progress Notes (Signed)
Physical Therapy Treatment Patient Details Name: [name] MRN: [id number] DOB: 04/04/1941 Today's Date: 10/11/2018    History of Present Illness 78 yo male s/p R TKR on 10/08/18. PMH includes diabetes with neuropathy and retinopathy, morbid obesity, L MCA, HLD, HTN, anxiety, CHF, PAF, OSA, GERD, skin cancer, depression, PVD, back surgery, L TKR.     PT Comments    Pt practiced safe stair technique with spouse present and assisting with RW.  Both pt and spouse report understanding and provided with handout on safe technique (son to also assist once home).  Pt feels ready for d/c home today.  Spouse reports plan is for HHPT.    Follow Up Recommendations  Follow surgeon's recommendation for DC plan and follow-up therapies;Supervision for mobility/OOB     Equipment Recommendations  Rolling walker with 5" wheels;3in1 (PT)    Recommendations for Other Services       Precautions / Restrictions Precautions Precautions: Fall Precaution Comments: spouse aware pt should be wearing KI for standing and mobilizing at home for safety Required Braces or Orthoses: Knee Immobilizer - Right Knee Immobilizer - Right: Discontinue once straight leg raise with < 10 degree lag;On when out of bed or walking Restrictions Other Position/Activity Restrictions: WBAT     Mobility  Bed Mobility               General bed mobility comments: pt up in recliner on arrival  Transfers Overall transfer level: Needs assistance Equipment used: Rolling walker (2 wheeled) Transfers: Sit to/from Stand Sit to Stand: Min guard         General transfer comment: verbal cues for hand placement and bringing R LE forward for pain control; spouse present and aware of cues provided  Ambulation/Gait Ambulation/Gait assistance: Min assist;Min guard;+2 safety/equipment Gait Distance (Feet): 40 Feet Assistive device: Rolling walker (2 wheeled) Gait Pattern/deviations: Step-to pattern;Decreased stance time -  right;Antalgic Gait velocity: decreased   General Gait Details: verbal cues for RW positioning, posture, step length   Stairs Stairs: Yes Stairs assistance: Min guard Stair Management: Step to pattern;Backwards;With walker Number of Stairs: 2 General stair comments: pt with poor ability to recall technique from this morning, spouse educated on backwards with RW and held RW for pt, multimodal cues for safety, sequence and RW positioning   Wheelchair Mobility    Modified Rankin (Stroke Patients Only)       Balance                                            Cognition Arousal/Alertness: Awake/alert Behavior During Therapy: WFL for tasks assessed/performed Overall Cognitive Status: History of cognitive impairments - at baseline                                 General Comments: memory issues per past notes      Exercises      General Comments        Pertinent Vitals/Pain Pain Assessment: 0-10 Pain Score: 4  Pain Location: R knee  Pain Descriptors / Indicators: Aching;Grimacing;Guarding Pain Intervention(s): Limited activity within patient's tolerance;Monitored during session;Repositioned    Home Living                      Prior Function  PT Goals (current goals can now be found in the care plan section) Progress towards PT goals: Progressing toward goals    Frequency    7X/week      PT Plan Current plan remains appropriate    Co-evaluation              AM-PAC PT "6 Clicks" Mobility   Outcome Measure  Help needed turning from your back to your side while in a flat bed without using bedrails?: A Little Help needed moving from lying on your back to sitting on the side of a flat bed without using bedrails?: A Little Help needed moving to and from a bed to a chair (including a wheelchair)?: A Little Help needed standing up from a chair using your arms (e.g., wheelchair or bedside chair)?: A  Little Help needed to walk in hospital room?: A Little Help needed climbing 3-5 steps with a railing? : A Little 6 Click Score: 18    End of Session Equipment Utilized During Treatment: Gait belt Activity Tolerance: Patient tolerated treatment well Patient left: in chair;with call bell/phone within reach;with family/visitor present Nurse Communication: Mobility status PT Visit Diagnosis: Other abnormalities of gait and mobility (R26.89);Difficulty in walking, not elsewhere classified (R26.2)     Time: 1207-1221 PT Time Calculation (min) (ACUTE ONLY): 14 min  Charges:  $Gait Training: 8-22 mins                     Carmelia Bake, PT, DPT Acute Rehabilitation Services Office: [phone] Pager: [phone]  Trena Platt 10/11/2018, 4:56 PM

## 2018-10-11 NOTE — Progress Notes (Signed)
   Subjective: 3 Days Post-Op Procedure(s) (LRB): RIGHT TOTAL KNEE ARTHROPLASTY (Right) Patient reports pain as mild.   Patient seen in rounds with Dr. Wynelle Link. Patient is well, and has had no acute complaints or problems. Did not pass physical therapy yesterday and was unable to discharge home. States he is feeling better this AM. Denies chest pain or SOB. Voiding without difficulty. Positive flatus.  Plan is to go Home after hospital stay.  Objective: Vital signs in last 24 hours: Temp:  [98.7 F (37.1 C)-99.9 F (37.7 C)] 99.9 F (37.7 C) (01/30 0513) Pulse Rate:  [79-105] 105 (01/30 0513) Resp:  [18-19] 19 (01/30 0513) BP: (138-173)/(75-98) 173/83 (01/30 0513) SpO2:  [94 %-96 %] 96 % (01/30 0513)  Intake/Output from previous day:  Intake/Output Summary (Last 24 hours) at 10/11/2018 0734 Last data filed at 10/11/2018 0600 Gross per 24 hour  Intake 940 ml  Output 1550 ml  Net -610 ml    Labs: Recent Labs    10/09/18 0612 10/10/18 0547 10/11/18 0507  HGB 10.6* 10.3* 10.7*   Recent Labs    10/10/18 0547 10/11/18 0507  WBC 14.8* 13.4*  RBC 2.97* 3.12*  HCT 31.0* 32.7*  PLT 485* 493*   Recent Labs    10/09/18 0612 10/10/18 0547  NA 133* 134*  K 4.5 4.1  CL 99 98  CO2 26 30  BUN 22 24*  CREATININE 0.93 0.98  GLUCOSE 365* 324*  CALCIUM 8.6* 8.7*   Exam: General - Patient is Alert and Oriented Extremity - Neurologically intact Neurovascular intact Sensation intact distally Dorsiflexion/Plantar flexion intact Dressing/Incision - clean, dry, no drainage Motor Function - intact, moving foot and toes well on exam.   Past Medical History:  Diagnosis Date  . Anxiety   . Arthritis   . Cancer (HCC)    skin - basil cell  . Depression   . Diabetes mellitus without complication ([place])   . Dysrhythmia    a-fib  . GERD (gastroesophageal reflux disease)   . Hyperlipidemia   . Hypertension   . Neuropathy   . Obesity   . Paroxysmal atrial fibrillation (HCC)    . Peripheral vascular disease ([place])    diabetic neuropathy in both feet  . Sleep apnea    uses C-pap machine  . Stroke ([place])    08/09/2017    Assessment/Plan: 3 Days Post-Op Procedure(s) (LRB): RIGHT TOTAL KNEE ARTHROPLASTY (Right) Principal Problem:   OA (osteoarthritis) of knee  Estimated body mass index is 37.97 kg/m as calculated from the following:   Height as of this encounter: 6' (1.829 m).   Weight as of this encounter: 127 kg. Up with therapy  DVT Prophylaxis - Eliquis Weight-bearing as tolerated  Plan for discharge this afternoon as long as meeting goals with therapy with HHPT. Follow-up in the office in 2 weeks.   Theresa Duty, PA-C Orthopedic Surgery 10/11/2018, 7:34 AM

## 2018-10-11 NOTE — Plan of Care (Signed)
  Problem: Activity: Goal: Risk for activity intolerance will decrease Outcome: Progressing   Problem: Pain Managment: Goal: General experience of comfort will improve Outcome: Progressing   Problem: Safety: Goal: Ability to remain free from injury will improve Outcome: Progressing   Problem: Activity: Goal: Ability to avoid complications of mobility impairment will improve Outcome: Progressing   Problem: Pain Management: Goal: Pain level will decrease with appropriate interventions Outcome: Progressing

## 2018-10-11 NOTE — Progress Notes (Signed)
Physical Therapy Treatment Patient Details Name: [name] MRN: [id number] DOB: 02-03-1941 Today's Date: 10/11/2018    History of Present Illness 78 yo male s/p R TKR on 10/08/18. PMH includes diabetes with neuropathy and retinopathy, morbid obesity, L MCA, HLD, HTN, anxiety, CHF, PAF, OSA, GERD, skin cancer, depression, PVD, back surgery, L TKR.     PT Comments    Pt assisted with ambulating in hallway and performing steps.  Attempted different technique for stairs and pt appears to perform this technique better however forgetful of cues so requested his family member be present for next session to go over safe stair technique.  Pt anticipates d/c after second session.    Follow Up Recommendations  Follow surgeon's recommendation for DC plan and follow-up therapies;Supervision for mobility/OOB(recommend HHPT)     Equipment Recommendations  Rolling walker with 5" wheels;3in1 (PT)    Recommendations for Other Services       Precautions / Restrictions Precautions Precautions: Fall Required Braces or Orthoses: Knee Immobilizer - Right Knee Immobilizer - Right: Discontinue once straight leg raise with < 10 degree lag;On when out of bed or walking Restrictions Weight Bearing Restrictions: No Other Position/Activity Restrictions: WBAT     Mobility  Bed Mobility Overal bed mobility: Needs Assistance Bed Mobility: Supine to Sit     Supine to sit: Min assist     General bed mobility comments: pt attempted to perform without assist (provided self assist cues) however unable to bring R LE over EOB requiring assist  Transfers Overall transfer level: Needs assistance Equipment used: Rolling walker (2 wheeled) Transfers: Sit to/from [employer] Transfers Sit to Stand: Min guard         General transfer comment: verbal cues for hand placement and bringing R LE forward for pain control  Ambulation/Gait Ambulation/Gait assistance: Min assist;Min guard;+2  safety/equipment Gait Distance (Feet): 60 Feet Assistive device: Rolling walker (2 wheeled) Gait Pattern/deviations: Step-to pattern;Decreased stance time - right;Antalgic Gait velocity: decreased   General Gait Details: verbal cues for RW positioning, posture, step length   Stairs Stairs: Yes Stairs assistance: +2 safety/equipment Stair Management: Step to pattern;Backwards;With walker Number of Stairs: 2 General stair comments: pt with poor ability to recall technique from yesterday, performed backwards with RW today and pt able to perform better, max multimodal cues for safety, sequence and RW positioning   Wheelchair Mobility    Modified Rankin (Stroke Patients Only)       Balance                                            Cognition Arousal/Alertness: Awake/alert Behavior During Therapy: WFL for tasks assessed/performed Overall Cognitive Status: History of cognitive impairments - at baseline                                 General Comments: memory issues per past notes      Exercises      General Comments        Pertinent Vitals/Pain Pain Assessment: 0-10 Pain Score: 5  Pain Location: R knee  Pain Descriptors / Indicators: Aching;Grimacing;Guarding Pain Intervention(s): Limited activity within patient's tolerance;Premedicated before session;Monitored during session;Repositioned;Ice applied    Home Living  Prior Function            PT Goals (current goals can now be found in the care plan section) Progress towards PT goals: Progressing toward goals    Frequency    7X/week      PT Plan Current plan remains appropriate    Co-evaluation              AM-PAC PT "6 Clicks" Mobility   Outcome Measure  Help needed turning from your back to your side while in a flat bed without using bedrails?: A Little Help needed moving from lying on your back to sitting on the side of a flat bed  without using bedrails?: A Little Help needed moving to and from a bed to a chair (including a wheelchair)?: A Little Help needed standing up from a chair using your arms (e.g., wheelchair or bedside chair)?: A Little Help needed to walk in hospital room?: A Little Help needed climbing 3-5 steps with a railing? : A Little 6 Click Score: 18    End of Session Equipment Utilized During Treatment: Gait belt Activity Tolerance: Patient tolerated treatment well Patient left: in chair;with call bell/phone within reach Nurse Communication: Mobility status PT Visit Diagnosis: Other abnormalities of gait and mobility (R26.89);Difficulty in walking, not elsewhere classified (R26.2)     Time: 1093-2355 PT Time Calculation (min) (ACUTE ONLY): 25 min  Charges:  $Gait Training: 23-37 mins                     Carmelia Bake, PT, DPT Acute Rehabilitation Services Office: [phone] Pager: [phone]  Trena Platt 10/11/2018, 11:57 AM

## 2018-10-12 DIAGNOSIS — E1140 Type 2 diabetes mellitus with diabetic neuropathy, unspecified: Secondary | ICD-10-CM | POA: Diagnosis not present

## 2018-10-12 DIAGNOSIS — M1711 Unilateral primary osteoarthritis, right knee: Secondary | ICD-10-CM | POA: Diagnosis not present

## 2018-10-12 DIAGNOSIS — M6281 Muscle weakness (generalized): Secondary | ICD-10-CM | POA: Diagnosis not present

## 2018-10-12 DIAGNOSIS — R269 Unspecified abnormalities of gait and mobility: Secondary | ICD-10-CM | POA: Diagnosis not present

## 2018-10-13 ENCOUNTER — Observation Stay (HOSPITAL_COMMUNITY)
Admission: EM | Admit: 2018-10-13 | Discharge: 2018-10-14 | Disposition: A | Discharge status: Home / Self Care | Payer: Medicare HMO | Attending: Family Medicine | Admitting: Family Medicine

## 2018-10-13 ENCOUNTER — Encounter (HOSPITAL_COMMUNITY): Payer: Self-pay | Admitting: Emergency Medicine

## 2018-10-13 ENCOUNTER — Other Ambulatory Visit: Payer: Self-pay

## 2018-10-13 ENCOUNTER — Emergency Department (HOSPITAL_COMMUNITY): Payer: Medicare HMO

## 2018-10-13 DIAGNOSIS — Z87891 Personal history of nicotine dependence: Secondary | ICD-10-CM | POA: Insufficient documentation

## 2018-10-13 DIAGNOSIS — R471 Dysarthria and anarthria: Secondary | ICD-10-CM

## 2018-10-13 DIAGNOSIS — R05 Cough: Secondary | ICD-10-CM | POA: Diagnosis not present

## 2018-10-13 DIAGNOSIS — Z96653 Presence of artificial knee joint, bilateral: Secondary | ICD-10-CM | POA: Insufficient documentation

## 2018-10-13 DIAGNOSIS — R5383 Other fatigue: Secondary | ICD-10-CM

## 2018-10-13 DIAGNOSIS — Z8673 Personal history of transient ischemic attack (TIA), and cerebral infarction without residual deficits: Secondary | ICD-10-CM | POA: Insufficient documentation

## 2018-10-13 DIAGNOSIS — I119 Hypertensive heart disease without heart failure: Secondary | ICD-10-CM | POA: Diagnosis not present

## 2018-10-13 DIAGNOSIS — I10 Essential (primary) hypertension: Secondary | ICD-10-CM | POA: Diagnosis not present

## 2018-10-13 DIAGNOSIS — E1151 Type 2 diabetes mellitus with diabetic peripheral angiopathy without gangrene: Secondary | ICD-10-CM | POA: Diagnosis not present

## 2018-10-13 DIAGNOSIS — E1165 Type 2 diabetes mellitus with hyperglycemia: Secondary | ICD-10-CM | POA: Insufficient documentation

## 2018-10-13 DIAGNOSIS — M6281 Muscle weakness (generalized): Secondary | ICD-10-CM | POA: Diagnosis not present

## 2018-10-13 DIAGNOSIS — R609 Edema, unspecified: Secondary | ICD-10-CM | POA: Diagnosis not present

## 2018-10-13 DIAGNOSIS — R2689 Other abnormalities of gait and mobility: Secondary | ICD-10-CM | POA: Insufficient documentation

## 2018-10-13 DIAGNOSIS — E785 Hyperlipidemia, unspecified: Secondary | ICD-10-CM | POA: Diagnosis not present

## 2018-10-13 DIAGNOSIS — Z8249 Family history of ischemic heart disease and other diseases of the circulatory system: Secondary | ICD-10-CM | POA: Insufficient documentation

## 2018-10-13 DIAGNOSIS — R2681 Unsteadiness on feet: Secondary | ICD-10-CM | POA: Insufficient documentation

## 2018-10-13 DIAGNOSIS — Z833 Family history of diabetes mellitus: Secondary | ICD-10-CM | POA: Diagnosis not present

## 2018-10-13 DIAGNOSIS — K5900 Constipation, unspecified: Secondary | ICD-10-CM | POA: Diagnosis not present

## 2018-10-13 DIAGNOSIS — Z794 Long term (current) use of insulin: Secondary | ICD-10-CM | POA: Diagnosis not present

## 2018-10-13 DIAGNOSIS — E669 Obesity, unspecified: Secondary | ICD-10-CM | POA: Diagnosis not present

## 2018-10-13 DIAGNOSIS — I480 Paroxysmal atrial fibrillation: Secondary | ICD-10-CM | POA: Insufficient documentation

## 2018-10-13 DIAGNOSIS — Z79899 Other long term (current) drug therapy: Secondary | ICD-10-CM | POA: Diagnosis not present

## 2018-10-13 DIAGNOSIS — I4891 Unspecified atrial fibrillation: Secondary | ICD-10-CM | POA: Diagnosis not present

## 2018-10-13 DIAGNOSIS — Z6837 Body mass index (BMI) 37.0-37.9, adult: Secondary | ICD-10-CM | POA: Insufficient documentation

## 2018-10-13 DIAGNOSIS — G92 Toxic encephalopathy: Secondary | ICD-10-CM | POA: Diagnosis not present

## 2018-10-13 DIAGNOSIS — Z79891 Long term (current) use of opiate analgesic: Secondary | ICD-10-CM | POA: Diagnosis not present

## 2018-10-13 DIAGNOSIS — E119 Type 2 diabetes mellitus without complications: Secondary | ICD-10-CM

## 2018-10-13 DIAGNOSIS — Z7901 Long term (current) use of anticoagulants: Secondary | ICD-10-CM | POA: Insufficient documentation

## 2018-10-13 DIAGNOSIS — E1142 Type 2 diabetes mellitus with diabetic polyneuropathy: Secondary | ICD-10-CM | POA: Diagnosis not present

## 2018-10-13 DIAGNOSIS — D539 Nutritional anemia, unspecified: Secondary | ICD-10-CM | POA: Insufficient documentation

## 2018-10-13 DIAGNOSIS — R0902 Hypoxemia: Secondary | ICD-10-CM | POA: Diagnosis not present

## 2018-10-13 LAB — CBC WITH DIFFERENTIAL/PLATELET
ABS IMMATURE GRANULOCYTES: 0.17 10*3/uL — AB (ref 0.00–0.07)
Basophils Absolute: 0.1 10*3/uL (ref 0.0–0.1)
Basophils Relative: 1 %
Eosinophils Absolute: 0.1 10*3/uL (ref 0.0–0.5)
Eosinophils Relative: 1 %
HCT: 33.4 % — ABNORMAL LOW (ref 39.0–52.0)
Hemoglobin: 11 g/dL — ABNORMAL LOW (ref 13.0–17.0)
IMMATURE GRANULOCYTES: 1 %
Lymphocytes Relative: 9 %
Lymphs Abs: 1.2 10*3/uL (ref 0.7–4.0)
MCH: 34.3 pg — ABNORMAL HIGH (ref 26.0–34.0)
MCHC: 32.9 g/dL (ref 30.0–36.0)
MCV: 104 fL — ABNORMAL HIGH (ref 80.0–100.0)
Monocytes Absolute: 1.3 10*3/uL — ABNORMAL HIGH (ref 0.1–1.0)
Monocytes Relative: 10 %
NEUTROS PCT: 78 %
NRBC: 0.8 % — AB (ref 0.0–0.2)
Neutro Abs: 10.5 10*3/uL — ABNORMAL HIGH (ref 1.7–7.7)
Platelets: 561 10*3/uL — ABNORMAL HIGH (ref 150–400)
RBC: 3.21 MIL/uL — ABNORMAL LOW (ref 4.22–5.81)
RDW: 16.2 % — ABNORMAL HIGH (ref 11.5–15.5)
WBC: 13.3 10*3/uL — ABNORMAL HIGH (ref 4.0–10.5)

## 2018-10-13 LAB — URINALYSIS, ROUTINE W REFLEX MICROSCOPIC
Bacteria, UA: NONE SEEN
Bilirubin Urine: NEGATIVE
Glucose, UA: >=500 mg/dL — AB
Hgb urine dipstick: NEGATIVE
Ketones, ur: NEGATIVE mg/dL
Leukocytes, UA: NEGATIVE
Nitrite: NEGATIVE
Protein, ur: NEGATIVE mg/dL
Specific Gravity, Urine: 1.012 (ref 1.005–1.030)
pH: 7 (ref 5.0–8.0)

## 2018-10-13 LAB — TSH: TSH: 2.881 u[IU]/mL (ref 0.350–4.500)

## 2018-10-13 LAB — I-STAT TROPONIN, ED: Troponin i, poc: 0.04 ng/mL (ref 0.00–0.08)

## 2018-10-13 LAB — LACTIC ACID, PLASMA: Lactic Acid, Venous: 1.8 mmol/L (ref 0.5–1.9)

## 2018-10-13 LAB — COMPREHENSIVE METABOLIC PANEL
ALT: 24 U/L (ref 0–44)
AST: 24 U/L (ref 15–41)
Albumin: 3.3 g/dL — ABNORMAL LOW (ref 3.5–5.0)
Alkaline Phosphatase: 70 U/L (ref 38–126)
Anion gap: 9 (ref 5–15)
BUN: 18 mg/dL (ref 8–23)
CO2: 26 mmol/L (ref 22–32)
Calcium: 8.4 mg/dL — ABNORMAL LOW (ref 8.9–10.3)
Chloride: 97 mmol/L — ABNORMAL LOW (ref 98–111)
Creatinine, Ser: 0.72 mg/dL (ref 0.61–1.24)
GFR calc Af Amer: >60 mL/min (ref >60)
GFR calc non Af Amer: >60 mL/min (ref >60)
Glucose, Bld: 352 mg/dL — ABNORMAL HIGH (ref 70–99)
Potassium: 3.9 mmol/L (ref 3.5–5.1)
SODIUM: 132 mmol/L — AB (ref 135–145)
Total Bilirubin: 1.7 mg/dL — ABNORMAL HIGH (ref 0.3–1.2)
Total Protein: 6.4 g/dL — ABNORMAL LOW (ref 6.5–8.1)

## 2018-10-13 LAB — CBG MONITORING, ED: Glucose-Capillary: 327 mg/dL — ABNORMAL HIGH (ref 70–99)

## 2018-10-13 LAB — BRAIN NATRIURETIC PEPTIDE: B Natriuretic Peptide: 72.9 pg/mL (ref 0.0–100.0)

## 2018-10-13 LAB — MAGNESIUM: Magnesium: 1.9 mg/dL (ref 1.7–2.4)

## 2018-10-13 MED ORDER — METHOCARBAMOL 500 MG PO TABS: 500.0000 mg | ORAL_TABLET | Freq: Four times a day (QID) | ORAL | Status: DC | PRN

## 2018-10-13 MED ORDER — ADULT MULTIVITAMIN W/MINERALS CH
1.0000 | ORAL_TABLET | Freq: Every day | ORAL | Status: DC
  Administered 2018-10-14: 1 via ORAL
  Filled 2018-10-13: qty 1

## 2018-10-13 MED ORDER — GABAPENTIN 300 MG PO CAPS: 300.0000 mg | ORAL_CAPSULE | Freq: Every evening | ORAL | Status: DC | PRN

## 2018-10-13 MED ORDER — FUROSEMIDE 40 MG PO TABS: 40.0000 mg | ORAL_TABLET | Freq: Every day | ORAL | Status: DC

## 2018-10-13 MED ORDER — HYDROXYUREA 500 MG PO CAPS
1000.0000 mg | ORAL_CAPSULE | Freq: Every day | ORAL | Status: DC
  Administered 2018-10-14: 1000 mg via ORAL
  Filled 2018-10-13: qty 2

## 2018-10-13 MED ORDER — OXYCODONE HCL 5 MG PO TABS
5.0000 mg | ORAL_TABLET | ORAL | Status: DC | PRN
  Administered 2018-10-14: 5 mg via ORAL
  Filled 2018-10-13: qty 1

## 2018-10-13 MED ORDER — SODIUM CHLORIDE 0.9 % IV BOLUS
500.0000 mL | Freq: Once | INTRAVENOUS | Status: AC
  Administered 2018-10-13: 500 mL via INTRAVENOUS

## 2018-10-13 MED ORDER — GABAPENTIN 300 MG PO CAPS: 600.0000 mg | ORAL_CAPSULE | Freq: Every day | ORAL | Status: DC

## 2018-10-13 MED ORDER — PANTOPRAZOLE SODIUM 40 MG PO TBEC
40.0000 mg | DELAYED_RELEASE_TABLET | Freq: Every day | ORAL | Status: DC
  Administered 2018-10-14: 40 mg via ORAL
  Filled 2018-10-13: qty 1

## 2018-10-13 MED ORDER — GABAPENTIN 600 MG PO TABS: 600.0000 mg | ORAL_TABLET | Freq: Every evening | ORAL | Status: DC | PRN

## 2018-10-13 MED ORDER — ALPRAZOLAM 0.5 MG PO TABS: 0.5000 mg | ORAL_TABLET | Freq: Two times a day (BID) | ORAL | Status: DC | PRN

## 2018-10-13 MED ORDER — SODIUM CHLORIDE 0.9 % IV BOLUS
1000.0000 mL | Freq: Once | INTRAVENOUS | Status: AC
  Administered 2018-10-13: 1000 mL via INTRAVENOUS

## 2018-10-13 MED ORDER — APIXABAN 5 MG PO TABS
5.0000 mg | ORAL_TABLET | Freq: Two times a day (BID) | ORAL | Status: DC
  Administered 2018-10-14 (×2): 5 mg via ORAL
  Filled 2018-10-13 (×3): qty 1

## 2018-10-13 MED ORDER — BENAZEPRIL HCL 20 MG PO TABS: 40.0000 mg | ORAL_TABLET | Freq: Every day | ORAL | Status: DC

## 2018-10-13 MED ORDER — INSULIN NPH (HUMAN) (ISOPHANE) 100 UNIT/ML ~~LOC~~ SUSP
20.0000 [IU] | Freq: Two times a day (BID) | SUBCUTANEOUS | Status: DC
  Administered 2018-10-14 (×2): 20 [IU] via SUBCUTANEOUS
  Filled 2018-10-13 (×2): qty 10

## 2018-10-13 MED ORDER — TRAMADOL HCL 50 MG PO TABS: 50.0000 mg | ORAL_TABLET | Freq: Four times a day (QID) | ORAL | Status: DC | PRN

## 2018-10-13 MED ORDER — VITAMIN D (ERGOCALCIFEROL) 1.25 MG (50000 UNIT) PO CAPS: 50000.0000 [IU] | ORAL_CAPSULE | ORAL | Status: DC

## 2018-10-13 MED ORDER — DILTIAZEM HCL 100 MG IV SOLR
5.0000 mg/h | INTRAVENOUS | Status: DC
  Administered 2018-10-13: 5 mg/h via INTRAVENOUS
  Filled 2018-10-13: qty 100

## 2018-10-13 MED ORDER — INSULIN ASPART 100 UNIT/ML ~~LOC~~ SOLN: 0.0000 [IU] | Freq: Three times a day (TID) | SUBCUTANEOUS | Status: DC

## 2018-10-13 MED ORDER — VENLAFAXINE HCL ER 75 MG PO CP24
75.0000 mg | ORAL_CAPSULE | Freq: Every day | ORAL | Status: DC
  Administered 2018-10-14: 75 mg via ORAL
  Filled 2018-10-13: qty 1

## 2018-10-13 MED ORDER — ATORVASTATIN CALCIUM 80 MG PO TABS: 80.0000 mg | ORAL_TABLET | Freq: Every day | ORAL | Status: DC

## 2018-10-13 NOTE — ED Provider Notes (Addendum)
[facility] DEPT Provider Note   CSN: [id number] Arrival date & time: 10/13/18  1754     History   Chief Complaint Chief Complaint  Patient presents with  . Knee Pain    HPI [name] is a 78 y.o. male.  The history is provided by the patient and medical records. No language interpreter was used.  Constipation  Severity:  Moderate Time since last bowel movement:  6 days Timing:  Constant Progression:  Unchanged Chronicity:  New Context: narcotics   Stool description:  None produced Relieved by:  Nothing Worsened by:  Nothing Ineffective treatments:  None tried Associated symptoms: no abdominal pain, no back pain, no diarrhea, no dysuria, no fever, no nausea, no urinary retention and no vomiting   Risk factors: recent surgery     Past Medical History:  Diagnosis Date  . Anxiety   . Arthritis   . Cancer (HCC)    skin - basil cell  . Depression   . Diabetes mellitus without complication ([place])   . Dysrhythmia    a-fib  . GERD (gastroesophageal reflux disease)   . Hyperlipidemia   . Hypertension   . Neuropathy   . Obesity   . Paroxysmal atrial fibrillation (HCC)   . Peripheral vascular disease ([place])    diabetic neuropathy in both feet  . Sleep apnea    uses C-pap machine  . Stroke [facility])    08/09/2017    Patient Active Problem List   Diagnosis Date Noted  . 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 Diabetic retinopathy ([place]) 10/06/2017  . Diabetic polyneuropathy ([place]) 10/06/2017  . Morbid obesity ([place]) 09/25/2017  . 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 Coagulopathy ([place]) 09/25/2017  . 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 HLD (hyperlipidemia) 08/09/2017  . Anxiety 08/09/2017  . Chronic diastolic CHF (congestive heart failure) ([place]) 08/09/2017  . OA (osteoarthritis) of knee 11/02/2015  . Hx of adenomatous colonic polyps 11/03/2014  . Paroxysmal atrial fibrillation ([place]) 11/03/2014  .  Long term current use of anticoagulant therapy 11/03/2014  . Obesity (BMI 30-39.9) 07/15/2014  . HTN (hypertension) 07/15/2014  . Chronic anticoagulation 06/20/2014  . 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 Obstructive sleep apnea 03/04/2014  . DUODENITIS WITHOUT MENTION OF HEMORRHAGE 06/24/2009  . 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 GERD 05/22/2009    Past Surgical History:  Procedure Laterality Date  . APPENDECTOMY  1962  . BACK SURGERY  00-02-12   x3  . BASAL CELL CARCINOMA EXCISION  93/06/10  . COLONOSCOPY    . KNEE ARTHROSCOPY  005/01/02  . 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 TOTAL KNEE ARTHROPLASTY Right 10/08/2018   Procedure: RIGHT TOTAL KNEE ARTHROPLASTY;  Surgeon: [name], MD;  Location: [facility] ORS;  Service: Orthopedics;  Laterality: Right;  77min        Home Medications    Prior to Admission medications   Medication Sig Start Date End Date Taking? Authorizing Provider  ALPRAZolam Duanne Moron) 0.5 MG tablet Take 0.5 mg by mouth 2 (two) times daily.     [provider]  apixaban (ELIQUIS) 5 MG TABS tablet Take 1 tablet (5 mg total) by mouth 2 (two) times daily. 02/12/18   Belva Crome, MD  atorvastatin (LIPITOR) 80 MG tablet Take 1 tablet (80 mg total)  by mouth daily at 6 PM. 08/11/17   Mariel Aloe, MD  benazepril (LOTENSIN) 40 MG tablet Take 1 tablet (40 mg total) by mouth daily. 08/14/17   Mariel Aloe, MD  ergocalciferol (VITAMIN D2) 50000 units capsule Take 50,000 Units by mouth once a week.     [provider]  furosemide (LASIX) 40 MG tablet Take 1 tablet (40 mg total) by mouth daily. Patient taking differently: Take 40 mg by mouth 2 (two) times daily.  06/05/18   Belva Crome, MD  gabapentin (NEURONTIN) 300 MG capsule Take 2 capsules (600 mg total) by mouth at  bedtime. 04/17/18   Venancio Poisson, NP  gabapentin (NEURONTIN) 600 MG tablet Take 600 mg by mouth daily. And at bedtime as needed for nerve pain 09/13/18   [provider]  hydroxyurea (HYDREA) 500 MG capsule Take 2 capsules (1,000 mg total) by mouth daily. May take with food to minimize GI side effects. 07/18/18   Magrinat, Virgie Dad, MD  insulin NPH-regular Human (NOVOLIN 70/30) (70-30) 100 UNIT/ML injection Inject 44 Units into the skin 2 (two) times daily with a meal.    [provider]  metFORMIN (GLUCOPHAGE) 1000 MG tablet Take 500 mg by mouth 2 (two) times daily. 08/27/18   [provider]  methocarbamol (ROBAXIN) 500 MG tablet Take 1 tablet (500 mg total) by mouth every 6 (six) hours as needed for muscle spasms. 10/10/18   Edmisten, Ok Anis, PA  Multiple Vitamin (MULTIVITAMIN WITH MINERALS) TABS tablet Take 1 tablet by mouth daily.    [provider]  omeprazole (PRILOSEC) 20 MG capsule Take 20 mg by mouth daily.    [provider]  oxyCODONE (OXY IR/ROXICODONE) 5 MG immediate release tablet Take 1-2 tablets (5-10 mg total) by mouth every 6 (six) hours as needed for moderate pain (pain score 4-6). 10/10/18   Edmisten, Ok Anis, PA  traMADol (ULTRAM) 50 MG tablet Take 1-2 tablets (50-100 mg total) by mouth every 6 (six) hours as needed for moderate pain (unresponsive to oxycodone). 10/10/18   Edmisten, Kristie L, PA  venlafaxine XR (EFFEXOR-XR) 75 MG 24 hr capsule Take 75 mg by mouth daily.    [provider]    Family History Family History  Problem Relation Age of Onset  . Cancer Mother   . Diabetes Mellitus II Mother   . Hypertension Mother   . Heart failure Father   . CVA Father   . Hypertension Sister   . Colon cancer Neg Hx     Social History Social History   Tobacco Use  . Smoking status: Former Smoker    Packs/day: 1.00    Years: 30.00    Pack years: 30.00    Types: Cigars    Last attempt to quit: 08/02/1992     Years since quitting: 26.2  . Smokeless tobacco: Never Used  Substance Use Topics  . Alcohol use: Yes    Alcohol/week: 0.0 standard drinks    Comment: 1 beer a night.   . Drug use: No     Allergies   Patient has no known allergies.   Review of Systems Review of Systems  Constitutional: Positive for fatigue. Negative for chills, diaphoresis and fever.  HENT: Negative for congestion and rhinorrhea.   Eyes: Positive for visual disturbance. Negative for photophobia.  Respiratory: Negative for cough, chest tightness, shortness of breath and wheezing.   Cardiovascular: Negative for chest pain, palpitations and leg swelling.  Gastrointestinal: Positive for constipation. Negative  for abdominal pain, diarrhea, nausea and vomiting.  Genitourinary: Negative for dysuria.  Musculoskeletal: Negative for back pain, neck pain and neck stiffness.  Skin: Positive for wound. Negative for rash.  Neurological: Positive for speech difficulty and light-headedness. Negative for dizziness, seizures, weakness, numbness and headaches.  Psychiatric/Behavioral: Negative for agitation.     Physical Exam Updated Vital Signs BP (!) 128/98   Pulse (!) 107   Temp 100 F (37.8 C) (Rectal)   Resp (!) 22   Ht 6' (1.829 m)   Wt 127 kg   SpO2 96%   BMI 37.97 kg/m   Physical Exam Vitals signs and nursing note reviewed.  Constitutional:      General: He is not in acute distress.    Appearance: He is well-developed. He is not ill-appearing, toxic-appearing or diaphoretic.  HENT:     Head: Normocephalic and atraumatic.     Mouth/Throat:     Pharynx: No oropharyngeal exudate or posterior oropharyngeal erythema.  Eyes:     Conjunctiva/sclera: Conjunctivae normal.     Pupils: Pupils are equal, round, and reactive to light.  Neck:     Musculoskeletal: Neck supple. No muscular tenderness.  Cardiovascular:     Rate and Rhythm: Tachycardia present. Rhythm irregular.     Pulses: Normal pulses.     Heart  sounds: No murmur.  Pulmonary:     Effort: Pulmonary effort is normal. No respiratory distress.     Breath sounds: Normal breath sounds. No wheezing, rhonchi or rales.  Chest:     Chest wall: No tenderness.  Abdominal:     General: Abdomen is flat. There is no distension.     Palpations: Abdomen is soft.     Tenderness: There is no abdominal tenderness.  Musculoskeletal:        General: Tenderness present.     Right lower leg: No edema.     Left lower leg: No edema.  Skin:    General: Skin is warm and dry.     Capillary Refill: Capillary refill takes less than 2 seconds.  Neurological:     General: No focal deficit present.     Mental Status: He is alert and oriented to person, place, and time.     GCS: GCS eye subscore is 4. GCS verbal subscore is 5. GCS motor subscore is 6.     Cranial Nerves: No cranial nerve deficit, dysarthria or facial asymmetry.     Sensory: Sensation is intact. No sensory deficit.     Motor: No weakness or abnormal muscle tone.     Comments: Gait deferred due to recent right knee surgery.  Speech was clear on my exam.  No numbness, tingling, weakness of extremities.  Normal finger-nose-finger testing bilaterally.  Normal extraocular movements and pupil exam unremarkable.  Patient did not have dysarthria from a.  Psychiatric:        Mood and Affect: Mood normal.      ED Treatments / Results  Labs (all labs ordered are listed, but only abnormal results are displayed) Labs Reviewed  CBC WITH DIFFERENTIAL/PLATELET - Abnormal; Notable for the following components:      Result Value   WBC 13.3 (*)    RBC 3.21 (*)    Hemoglobin 11.0 (*)    HCT 33.4 (*)    MCV 104.0 (*)    MCH 34.3 (*)    RDW 16.2 (*)    Platelets 561 (*)    nRBC 0.8 (*)    Neutro Abs 10.5 (*)  Monocytes Absolute 1.3 (*)    Abs Immature Granulocytes 0.17 (*)    All other components within normal limits  COMPREHENSIVE METABOLIC PANEL - Abnormal; Notable for the following  components:   Sodium 132 (*)    Chloride 97 (*)    Glucose, Bld 352 (*)    Calcium 8.4 (*)    Total Protein 6.4 (*)    Albumin 3.3 (*)    Total Bilirubin 1.7 (*)    All other components within normal limits  URINALYSIS, ROUTINE W REFLEX MICROSCOPIC - Abnormal; Notable for the following components:   Glucose, UA >=500 (*)    All other components within normal limits  CBG MONITORING, ED - Abnormal; Notable for the following components:   Glucose-Capillary 327 (*)    All other components within normal limits  URINE CULTURE  TSH  BRAIN NATRIURETIC PEPTIDE  MAGNESIUM  LACTIC ACID, PLASMA  LACTIC ACID, PLASMA  I-STAT TROPONIN, ED    EKG None   ED ECG REPORT   Date: 10/13/2018  Rate: 95  Rhythm: atrial flutter  QRS Axis: normal  Intervals: normal  ST/T Wave abnormalities: nonspecific T wave changes  Conduction Disutrbances:none  Narrative Interpretation:   Old EKG Reviewed: unchanged  I have personally reviewed the EKG tracing and agree with the computerized printout as noted.    Radiology Dg Chest 2 View  Result Date: 10/13/2018 CLINICAL DATA:  Confusion, hyperglycemia, congestion, cough, elevated heart rate. 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 Chest 05/25/2011 FINDINGS: Mild cardiac enlargement. No vascular congestion, consolidation, or edema. No blunting of costophrenic angles. No pneumothorax. Mediastinal contours appear intact. Degenerative changes in the spine. IMPRESSION: Mild cardiac enlargement. No evidence of active pulmonary disease. Electronically Signed   By: Lucienne Capers M.D.   On: 10/13/2018 20:00   Vas [place] Lower Extremity Venous (dvt) (only [facility])  Result Date: 10/13/2018  Lower Venous Study Indications: Knee Pain, and Recent total knee replacement.  Limitations: Edema. Comparison Study: No prior study on file Performing Technologist: Sharion Dove RVS  Examination Guidelines: A complete evaluation  includes B-mode imaging, spectral Doppler, color Doppler, and power Doppler as needed of all accessible portions of each vessel. Bilateral testing is considered an integral part of a complete examination. Limited examinations for reoccurring indications may be performed as noted.  Right Venous Findings: +---------+---------------+---------+-----------+----------+--------------+          CompressibilityPhasicitySpontaneityPropertiesSummary        +---------+---------------+---------+-----------+----------+--------------+ CFV      Full           Yes      Yes                                 +---------+---------------+---------+-----------+----------+--------------+ SFJ      Full                                                        +---------+---------------+---------+-----------+----------+--------------+ FV Prox  Full                                                        +---------+---------------+---------+-----------+----------+--------------+  FV Mid   Full                                                        +---------+---------------+---------+-----------+----------+--------------+ FV DistalFull                                                        +---------+---------------+---------+-----------+----------+--------------+ PFV                                                   Not visualized +---------+---------------+---------+-----------+----------+--------------+ POP      Full           Yes      Yes                                 +---------+---------------+---------+-----------+----------+--------------+ PTV      Full                                                        +---------+---------------+---------+-----------+----------+--------------+ PERO     Full                                                        +---------+---------------+---------+-----------+----------+--------------+  Left Venous Findings:  +---+---------------+---------+-----------+----------+-------+    CompressibilityPhasicitySpontaneityPropertiesSummary +---+---------------+---------+-----------+----------+-------+ CFVFull           Yes      Yes                          +---+---------------+---------+-----------+----------+-------+    Summary: Right: There is no evidence of deep vein thrombosis in the lower extremity. However, portions of this examination were limited- see technologist comments above. Left: No evidence of common femoral vein obstruction.  *See table(s) above for measurements and observations.    Preliminary     Procedures Procedures (including critical care time)  CRITICAL CARE Performed by: Gwenyth Allegra Tegeler Total critical care time: 35 minutes Critical care time was exclusive of separately billable procedures and treating other patients. Critical care was necessary to treat or prevent imminent or life-threatening deterioration. Critical care was time spent personally by me on the following activities: development of treatment plan with patient and/or surrogate as well as nursing, discussions with consultants, evaluation of patient's response to treatment, examination of patient, obtaining history from patient or surrogate, ordering and performing treatments and interventions, ordering and review of laboratory studies, ordering and review of radiographic studies, pulse oximetry and re-evaluation of patient's condition.   Medications Ordered in ED Medications  diltiazem (CARDIZEM) 100 mg in dextrose 5 % 100 mL (1 mg/mL) infusion (5 mg/hr Intravenous New Bag/Given  10/13/18 2253)  venlafaxine XR (EFFEXOR-XR) 24 hr capsule 75 mg (has no administration in time range)  ALPRAZolam (XANAX) tablet 0.5 mg (has no administration in time range)  pantoprazole (PROTONIX) EC tablet 40 mg (has no administration in time range)  atorvastatin (LIPITOR) tablet 80 mg (has no administration in time range)  apixaban  (ELIQUIS) tablet 5 mg (has no administration in time range)  furosemide (LASIX) tablet 40 mg (has no administration in time range)  hydroxyurea (HYDREA) capsule 1,000 mg (has no administration in time range)  multivitamin with minerals tablet 1 tablet (has no administration in time range)  traMADol (ULTRAM) tablet 50-100 mg (has no administration in time range)  methocarbamol (ROBAXIN) tablet 500 mg (has no administration in time range)  oxyCODONE (Oxy IR/ROXICODONE) immediate release tablet 5 mg (has no administration in time range)  benazepril (LOTENSIN) tablet 40 mg (has no administration in time range)  gabapentin (NEURONTIN) capsule 300 mg (has no administration in time range)  insulin aspart (novoLOG) injection 0-15 Units (has no administration in time range)  insulin NPH Human (HUMULIN N,NOVOLIN N) injection 20 Units (has no administration in time range)  sodium chloride 0.9 % bolus 500 mL (0 mLs Intravenous Stopped 10/13/18 2048)  sodium chloride 0.9 % bolus 1,000 mL (1,000 mLs Intravenous New Bag/Given 10/13/18 2214)     Initial Impression / Assessment and Plan / ED Course  I have reviewed the triage vital signs and the nursing notes.  Pertinent labs & imaging results that were available during my care of the patient were reviewed by me and considered in my medical decision making (see chart for details).     [name] is a 78 y.o. male with a past medical history significant for hypertension, hyperlipidemia, diabetes, obesity, osteoarthritis, prior stroke, diabetic polyneuropathies, congestive heart failure, and recent right knee total replacement surgery 4 days ago who presents with constipation and "feeling out of it".  Patient reports that his right knee is not bothering him significantly.  He reports that he had home physical therapy come to his house yesterday and he was able to raise his leg.  He reports that he still having pain bending his knee and having pain behind his  knee.  He denies other swelling or pain distal to his knee.  He reports no significant change in redness or drainage from the surgical site.  He is not as concerned about his knee and is more concerned about his lack of bowel movement since the surgery and him feeling somewhat slower with his thinking.  He denies any focal numbness, tingling, or weakness aside from the weakness in his postoperative leg.  He denies any vision changes, nausea, vomiting.  He denies any urinary symptoms.  Denies any fevers, chills, congestion, or cough.  He denies any focal neurologic problems.  On exam, patient has a vertical incision on his right knee that appears to be healing.  There is some surrounding erythema from the site.  Minimal tenderness.  No drainage.  Tenderness around his knee joint.  Patient has pain with knee movement and manipulation.  Patient does have good strength in his ankle and has reportedly unchanged sensation bilaterally compared to his baseline.  He does have neuropathies.  Palpable pulses in DP arteries.  Hips nontender.  Abdomen nontender.  Lungs clear and chest nontender.  No murmur.  Clinically, have low suspicion for acute postoperative problem in his right knee.  He denies significant worsening of his knee pain and swelling postoperatively.  Clinical aspect patient is having symptoms of mild confusion and feeling "slower with this thinking" due to the narcotics and muscle relaxants he is taking for the postoperative pain.  I will suspect this is contributing to his constipation postoperatively.  Patient will have screening labs to look for occult infection in his lungs or urine as well as labs given his reported hyperglycemia.  Will obtain chest x-ray to look for postoperative pneumonia.  With the pain behind his knee, will get DVT ultrasound.  Given his nonfocal symptoms, doubt stroke at this time.  Anticipate reassessment after work-up.  10:35 PM DVT study was negative.  Patient's  urinalysis does not show infection.  Chest x-ray shows no pneumonia.  BNP not elevated, suspect mild dehydration.  Initial troponin negative.  Lactic acid not elevated, doubt sepsis.  CMP and CBC similar or improved from prior.  Similar leukocytosis to prior.  TSH normal.  On reassessment, patient is now in A. fib with RVR with a rate in the 130s to 140s.  After a small amount of fluids, heart rate improved into the 120s.  Family arrived and reports that they are more concerned about the patient's intermittent slurred speech, repetitive questioning, and some reported bilateral blurry vision all her symptoms he had with his prior stroke.  Patient then acknowledges these are indeed symptoms he is experiencing.  He reports he has had these for the last few days since his surgery.  With this new information I am concerned that patient may have had a small stroke in the setting of his A. fib and likely cause of his blood thinners with surgery.   I do however suspect that patient symptoms may be exacerbated by the pain medicine and muscle relaxants in his postoperative management causing his symptoms.  Due to the concern for possible stroke symptoms, neurology was called.  They recommended admission to [facility] for MRI and further evaluation of his A. fib with RVR.  They recommended a diltiazem drip which will be ordered.  They will see the patient tonight.  Hospitalist team called for admission.   Final Clinical Impressions(s) / ED Diagnoses   Final diagnoses:  Fatigue, unspecified type  Atrial fibrillation with RVR [facility])  Dysarthria    ED Discharge Orders    None      . Clinical Impression: 1. Fatigue, unspecified type   2. Atrial fibrillation with RVR ([place])   3. Dysarthria     Disposition: Admit  This note was prepared with assistance of Dragon voice recognition software. Occasional wrong-word or sound-a-like substitutions may have occurred due to the inherent limitations of voice  recognition software.     Tegeler, Gwenyth Allegra, MD 10/13/18 7680    Tegeler, Gwenyth Allegra, MD 10/27/18 2025

## 2018-10-13 NOTE — ED Notes (Addendum)
ED Provider at bedside. 

## 2018-10-13 NOTE — ED Notes (Signed)
ED TO INPATIENT HANDOFF REPORT  Name/Age/Gender [name] 78 y.o. male  Code Status Code Status History    Date Active Date Inactive Code Status Order ID Comments User Context   10/08/2018 1245 10/11/2018 1612 Full Code [id number]  [name], MD Inpatient   08/09/2017 2335 08/10/2017 2214 Full Code [id number]  Ivor Costa, MD ED    Advance Directive Documentation     Most Recent Value  Type of Advance Directive  Living will, Healthcare Power of Attorney  Pre-existing out of facility DNR order (yellow form or pink MOST form)  -  "MOST" Form in Place?  -      Home/SNF/Other Home  Chief Complaint Right Knee Pain  Level of Care/Admitting Diagnosis ED Disposition    ED Disposition Condition [place]: [place] [[id number]]  Level of Care: Cardiac Telemetry [103]  Diagnosis: Atrial fibrillation with RVR [facility]) [[id number]]  Admitting Physician: Colbert Ewing [[id number]]  Attending Physician: Colbert Ewing [[id number]]  Estimated length of stay: past midnight tomorrow  Certification:: I certify this patient will need inpatient services for at least 2 midnights  PT Class (Do Not Modify): Inpatient [101]  PT Acc Code (Do Not Modify): Private [1]       Medical History Past Medical History:  Diagnosis Date  . Anxiety   . Arthritis   . Cancer (HCC)    skin - basil cell  . Depression   . Diabetes mellitus without complication ([place])   . Dysrhythmia    a-fib  . GERD (gastroesophageal reflux disease)   . Hyperlipidemia   . Hypertension   . Neuropathy   . Obesity   . Paroxysmal atrial fibrillation (HCC)   . Peripheral vascular disease ([place])    diabetic neuropathy in both feet  . Sleep apnea    uses C-pap machine  . Stroke ([place])    08/09/2017    Allergies No Known Allergies  IV Location/Drains/Wounds Patient Lines/Drains/Airways Status   Active Line/Drains/Airways    Name:   Placement date:   Placement time:   Site:   Days:    Peripheral IV 10/13/18 Left Arm   10/13/18    1809    Arm   less than 1   Incision (Closed) 10/08/18 Knee Right   10/08/18    1000     5          Labs/Imaging Results for orders placed or performed during the hospital encounter of 10/13/18 (from the past 48 hour(s))  CBC with Differential     Status: Abnormal   Collection Time: 10/13/18  6:24 PM  Result Value Ref Range   WBC 13.3 (H) 4.0 - 10.5 K/uL   RBC 3.21 (L) 4.22 - 5.81 MIL/uL   Hemoglobin 11.0 (L) 13.0 - 17.0 g/dL   HCT 33.4 (L) 39.0 - 52.0 %   MCV 104.0 (H) 80.0 - 100.0 fL   MCH 34.3 (H) 26.0 - 34.0 pg   MCHC 32.9 30.0 - 36.0 g/dL   RDW 16.2 (H) 11.5 - 15.5 %   Platelets 561 (H) 150 - 400 K/uL   nRBC 0.8 (H) 0.0 - 0.2 %   Neutrophils Relative % 78 %   Neutro Abs 10.5 (H) 1.7 - 7.7 K/uL   Lymphocytes Relative 9 %   Lymphs Abs 1.2 0.7 - 4.0 K/uL   Monocytes Relative 10 %   Monocytes Absolute 1.3 (H) 0.1 - 1.0 K/uL   Eosinophils Relative 1 %  Eosinophils Absolute 0.1 0.0 - 0.5 K/uL   Basophils Relative 1 %   Basophils Absolute 0.1 0.0 - 0.1 K/uL   Immature Granulocytes 1 %   Abs Immature Granulocytes 0.17 (H) 0.00 - 0.07 K/uL    Comment: Performed at [facility], [place] [address] [place], [place] [address]  Comprehensive metabolic panel     Status: Abnormal   Collection Time: 10/13/18  6:24 PM  Result Value Ref Range   Sodium 132 (L) 135 - 145 mmol/L   Potassium 3.9 3.5 - 5.1 mmol/L   Chloride 97 (L) 98 - 111 mmol/L   CO2 26 22 - 32 mmol/L   Glucose, Bld 352 (H) 70 - 99 mg/dL   BUN 18 8 - 23 mg/dL   Creatinine, Ser 0.72 0.61 - 1.24 mg/dL   Calcium 8.4 (L) 8.9 - 10.3 mg/dL   Total Protein 6.4 (L) 6.5 - 8.1 g/dL   Albumin 3.3 (L) 3.5 - 5.0 g/dL   AST 24 15 - 41 U/L   ALT 24 0 - 44 U/L   Alkaline Phosphatase 70 38 - 126 U/L   Total Bilirubin 1.7 (H) 0.3 - 1.2 mg/dL   GFR calc non Af Amer >60 >60 mL/min   GFR calc Af Amer >60 >60 mL/min   Anion gap 9 5 - 15    Comment: Performed at [facility], [place] [address], [place], [place] [address]  Magnesium     Status: None   Collection Time: 10/13/18  6:24 PM  Result Value Ref Range   Magnesium 1.9 1.7 - 2.4 mg/dL    Comment: Performed at [facility], [place] [address] [place], [place] [address]  TSH     Status: None   Collection Time: 10/13/18  6:25 PM  Result Value Ref Range   TSH 2.881 0.350 - 4.500 uIU/mL    Comment: Performed by a 3rd Generation assay with a functional sensitivity of <=0.01 uIU/mL. Performed at [facility], [place] [address], [place], [place] [address]   Brain natriuretic peptide     Status: None   Collection Time: 10/13/18  6:26 PM  Result Value Ref Range   B Natriuretic Peptide 72.9 0.0 - 100.0 pg/mL    Comment: Performed at [facility], [place] [address], [place], [place] [address]  Lactic acid, plasma     Status: None   Collection Time: 10/13/18  6:29 PM  Result Value Ref Range   Lactic Acid, Venous 1.8 0.5 - 1.9 mmol/L    Comment: Performed at [facility], [place] [address] [place], [place] [address]  CBG monitoring, ED     Status: Abnormal   Collection Time: 10/13/18  6:30 PM  Result Value Ref Range   Glucose-Capillary 327 (H) 70 - 99 mg/dL  I-Stat Troponin, ED (not at [facility])     Status: None   Collection Time: 10/13/18  6:39 PM  Result Value Ref Range   Troponin i, poc 0.04 0.00 - 0.08 ng/mL   Comment 3            Comment: Due to the release kinetics of cTnI, a negative result within the first hours of the onset of symptoms does not rule out myocardial infarction with certainty. If myocardial infarction is still suspected, repeat the test at appropriate intervals.   Urinalysis, Routine w reflex microscopic     Status: Abnormal   Collection Time: 10/13/18  8:34 PM  Result  Value Ref Range   Color, Urine YELLOW YELLOW   APPearance CLEAR CLEAR   Specific Gravity, Urine 1.012 1.005 - 1.030   pH 7.0 5.0 -  8.0   Glucose, UA >=500 (A) NEGATIVE mg/dL   Hgb urine dipstick NEGATIVE NEGATIVE   Bilirubin Urine NEGATIVE NEGATIVE   Ketones, ur NEGATIVE NEGATIVE mg/dL   Protein, ur NEGATIVE NEGATIVE mg/dL   Nitrite NEGATIVE NEGATIVE   Leukocytes, UA NEGATIVE NEGATIVE   RBC / HPF 0-5 0 - 5 RBC/hpf   WBC, UA 0-5 0 - 5 WBC/hpf   Bacteria, UA NONE SEEN NONE SEEN    Comment: Performed at [facility], [place] [address], [place], [place] [address]   Dg Chest 2 View  Result Date: 10/13/2018 CLINICAL DATA:  Confusion, hyperglycemia, congestion, cough, elevated heart rate. 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 Chest 05/25/2011 FINDINGS: Mild cardiac enlargement. No vascular congestion, consolidation, or edema. No blunting of costophrenic angles. No pneumothorax. Mediastinal contours appear intact. Degenerative changes in the spine. IMPRESSION: Mild cardiac enlargement. No evidence of active pulmonary disease. Electronically Signed   By: Lucienne Capers M.D.   On: 10/13/2018 20:00   Vas [place] Lower Extremity Venous (dvt) (only [facility])  Result Date: 10/13/2018  Lower Venous Study Indications: Knee Pain, and Recent total knee replacement.  Limitations: Edema. Comparison Study: No prior study on file Performing Technologist: Sharion Dove RVS  Examination Guidelines: A complete evaluation includes B-mode imaging, spectral Doppler, color Doppler, and power Doppler as needed of all accessible portions of each vessel. Bilateral testing is considered an integral part of a complete examination. Limited examinations for reoccurring indications may be performed as noted.  Right Venous Findings: +---------+---------------+---------+-----------+----------+--------------+          CompressibilityPhasicitySpontaneityPropertiesSummary        +---------+---------------+---------+-----------+----------+--------------+ CFV      Full           Yes      Yes                                  +---------+---------------+---------+-----------+----------+--------------+ SFJ      Full                                                        +---------+---------------+---------+-----------+----------+--------------+ FV Prox  Full                                                        +---------+---------------+---------+-----------+----------+--------------+ FV Mid   Full                                                        +---------+---------------+---------+-----------+----------+--------------+ FV DistalFull                                                        +---------+---------------+---------+-----------+----------+--------------+  PFV                                                   Not visualized +---------+---------------+---------+-----------+----------+--------------+ POP      Full           Yes      Yes                                 +---------+---------------+---------+-----------+----------+--------------+ PTV      Full                                                        +---------+---------------+---------+-----------+----------+--------------+ PERO     Full                                                        +---------+---------------+---------+-----------+----------+--------------+  Left Venous Findings: +---+---------------+---------+-----------+----------+-------+    CompressibilityPhasicitySpontaneityPropertiesSummary +---+---------------+---------+-----------+----------+-------+ CFVFull           Yes      Yes                          +---+---------------+---------+-----------+----------+-------+    Summary: Right: There is no evidence of deep vein thrombosis in the lower extremity. However, portions of this examination were limited- see technologist comments above. Left: No evidence of common femoral vein obstruction.  *See table(s) above for measurements and observations.     Preliminary    None  Pending Labs Unresulted Labs (From admission, onward)    Start     Ordered   10/13/18 1829  Lactic acid, plasma  Now then every 2 hours,   STAT     10/13/18 1828   10/13/18 1825  Urine culture  ONCE - STAT,   STAT     10/13/18 1825          Vitals/Pain Today's Vitals   10/13/18 2058 10/13/18 2100 10/13/18 2200 10/13/18 2230  BP: (!) 150/96 135/78 (!) 154/93 (!) 174/92  Pulse: (!) 112 (!) 104 61 99  Resp: 16  19   Temp:      TempSrc:      SpO2: 96% 98% 96% 96%  Weight:      Height:      PainSc: 5        Isolation Precautions No active isolations  Medications Medications  sodium chloride 0.9 % bolus 1,000 mL (1,000 mLs Intravenous New Bag/Given 10/13/18 2214)  diltiazem (CARDIZEM) 100 mg in dextrose 5 % 100 mL (1 mg/mL) infusion (5 mg/hr Intravenous New Bag/Given 10/13/18 2253)  sodium chloride 0.9 % bolus 500 mL (0 mLs Intravenous Stopped 10/13/18 2048)    Mobility walks with device

## 2018-10-13 NOTE — ED Notes (Signed)
Hospitalist at bedside 

## 2018-10-13 NOTE — ED Triage Notes (Signed)
Patient arrived by EMS from home. Pt had total knee replacement Monday and was discharged home on Thursday. Pt had at home PT yesterday. Pt c/o knee pain. Pt is able to ambulate on knee currently per EMS. EMS reported normal pedal pulses on both feet.    Pt is on Eliquis.   Hx of DM, a-fib.   CBG 455, BP 170/80, HR 88.

## 2018-10-13 NOTE — ED Notes (Signed)
Bed: KS84 Expected date:  Expected time:  Means of arrival:  Comments: 78 yo post surgical pain, hyperglycemia

## 2018-10-13 NOTE — Progress Notes (Signed)
VASCULAR LAB PRELIMINARY  PRELIMINARY  PRELIMINARY  PRELIMINARY  Right lower extremity venous duplex completed.    Preliminary report:  See CV Proc  Gave results to Dr. Emelda Fear, [facility], RVT 10/13/2018, 7:21 PM

## 2018-10-13 NOTE — ED Notes (Signed)
[facility] called for  transport.

## 2018-10-13 NOTE — H&P (Signed)
History and Physical  [name] YHO:[id number] DOB: [birth year] DOA: 10/13/2018 1800  Referring physician: Lillard Anes ED) PCP: [name], MD  Outpatient Specialists: Rometta Emery (orthopedics); Linard Millers (cardiology)  HISTORY   Chief Complaint: confusion and dysarthria  HPI: [name] is a 78 y.o. 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 Per ED, patient had initially only reported constipation but upon discussion with family, it was revealed that main concern for patient's change in mentation and speech since his R knee surgery x 1 week that are similar to symptoms from his prior stroke in 2018. Of note, his home eliquis had been held prior to surgery and resumed in the past week. Wife states that the day immediately after surgery, she had noted that he had been slurring his speech and that he seemed to be intermittently confused. Also noted he was frequently repeating himself. Over the next few days, family thought that patient's mentation had been improving and he was discharged home with home PT. Wife notes that patient was able to interact and participate well with home PT on Friday (day prior to admission). However the next morning, family noted that patient was noticeably more drowsy, refusing to participate with family activities, and occasionally slurring his words. They also report that patient had complained of transient vision blurriness. No motor deficits noted by family.  Per family, these sx (mild dysarthria, confusion) are similar to patient's prior stroke (L MCA) in 2018. Since the stroke, patient had recovered completely and had suffered no residual deficits -- family reports that recent changes in mentation/speech are new from baseline prior to the recent surgery.  No reported fevers/chills.    Review of Systems: + confusion, dysarthria - no fevers/chills - no cough - no chest pain, dyspnea on exertion - no edema, PND, orthopnea - no nausea/vomiting; no tarry, melanotic or bloody stools - no dysuria, increased urinary frequency - no weight changes  Rest of systems reviewed are negative, except as per above history.   ED course:  Vitals Blood pressure (!) 128/98, pulse 90, temperature 100 F (37.8 C), temperature source Rectal, resp. rate 16, height 6' (1.829 m), weight 127 kg, SpO2 96 %. Received NS bolus 1.5 L; diltiazem drip started  Past Medical History:  Diagnosis Date  . Anxiety   . Arthritis   . Cancer (HCC)    skin - basil cell  . Depression   . Diabetes mellitus without complication ([place])   . Dysrhythmia    a-fib  . GERD (gastroesophageal reflux disease)   . Hyperlipidemia   . Hypertension   . Neuropathy   . Obesity   . Paroxysmal atrial fibrillation (HCC)   . Peripheral vascular disease ([place])    diabetic neuropathy in both feet  . Sleep apnea    uses C-pap machine  . Stroke [facility])    08/09/2017   Past Surgical History:  Procedure Laterality Date  . APPENDECTOMY  1962  . BACK SURGERY  00-02-12   x3  . BASAL CELL CARCINOMA EXCISION  93/06/10  . COLONOSCOPY    . KNEE ARTHROSCOPY  005/01/02  . 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 TOTAL KNEE ARTHROPLASTY Right 10/08/2018  Procedure: RIGHT TOTAL KNEE ARTHROPLASTY;  Surgeon: [name], MD;  Location: [facility]S;  Service: Orthopedics;  Laterality: Right;  50mn    Social History:  reports that he quit smoking about 26 years ago. His smoking use included cigars. He has a 30.00 pack-year smoking history. He has never used smokeless tobacco. He reports current alcohol use. He reports that he does not use drugs.  No Known Allergies  Family History  Problem  Relation Age of Onset  . Cancer Mother   . Diabetes Mellitus II Mother   . Hypertension Mother   . Heart failure Father   . CVA Father   . Hypertension Sister   . Colon cancer Neg Hx       Prior to Admission medications   Medication Sig Start Date End Date Taking? Authorizing Provider  ALPRAZolam (Duanne Moron 0.5 MG tablet Take 0.5 mg by mouth 2 (two) times daily.    Yes [provider]  apixaban (ELIQUIS) 5 MG TABS tablet Take 1 tablet (5 mg total) by mouth 2 (two) times daily. 02/12/18  Yes SBelva Crome MD  atorvastatin (LIPITOR) 80 MG tablet Take 1 tablet (80 mg total) by mouth daily at 6 PM. 08/11/17  Yes NMariel Aloe MD  benazepril (LOTENSIN) 40 MG tablet Take 1 tablet (40 mg total) by mouth daily. 08/14/17  Yes NMariel Aloe MD  ergocalciferol (VITAMIN D2) 50000 units capsule Take 50,000 Units by mouth once a week.    Yes [provider]  furosemide (LASIX) 40 MG tablet Take 1 tablet (40 mg total) by mouth daily. Patient taking differently: Take 40 mg by mouth 2 (two) times daily.  06/05/18  Yes SBelva Crome MD  gabapentin (NEURONTIN) 300 MG capsule Take 2 capsules (600 mg total) by mouth at bedtime. 04/17/18  Yes VVenancio Poisson NP  gabapentin (NEURONTIN) 600 MG tablet Take 600 mg by mouth at bedtime as needed (nerve pain).  09/13/18  Yes [provider]  hydroxyurea (HYDREA) 500 MG capsule Take 2 capsules (1,000 mg total) by mouth daily. May take with food to minimize GI side effects. 07/18/18  Yes Magrinat, GVirgie Dad MD  insulin NPH-regular Human (NOVOLIN 70/30) (70-30) 100 UNIT/ML injection Inject 44 Units into the skin 2 (two) times daily with a meal.   Yes [provider]  metFORMIN (GLUCOPHAGE) 1000 MG tablet Take 500 mg by mouth 2 (two) times daily. 08/27/18  Yes [provider]  methocarbamol (ROBAXIN) 500 MG tablet Take 1 tablet (500 mg total) by mouth every 6 (six) hours as needed for muscle spasms. 10/10/18  Yes Edmisten,  KOk Anis PA  Multiple Vitamin (MULTIVITAMIN WITH MINERALS) TABS tablet Take 1 tablet by mouth daily.   Yes [provider]  omeprazole (PRILOSEC) 20 MG capsule Take 20 mg by mouth daily.   Yes [provider]  oxyCODONE (OXY IR/ROXICODONE) 5 MG immediate release tablet Take 1-2 tablets (5-10 mg total) by mouth every 6 (six) hours as needed for moderate pain (pain score 4-6). 10/10/18  Yes Edmisten, Kristie L, PA  traMADol (ULTRAM) 50 MG tablet Take 1-2 tablets (50-100 mg total) by mouth every 6 (six) hours as needed for moderate pain (unresponsive to oxycodone). 10/10/18  Yes Edmisten, Kristie L, PA  venlafaxine XR (EFFEXOR-XR) 75 MG 24 hr capsule Take 75 mg by mouth daily.   Yes [provider]    PHYSICAL EXAM   Temp:  [99.2 F (37.3 C)-100 F (37.8 C)] 100 F (37.8 C) (  02/01 1841) Pulse Rate:  [61-121] 90 (02/01 2330) Cardiac Rhythm: Atrial fibrillation (02/01 1824) Resp:  [16-22] 16 (02/01 2330) BP: (128-174)/(78-131) 128/98 (02/01 2300) SpO2:  [93 %-98 %] 96 % (02/01 2330) Weight:  [127 kg] 127 kg (02/01 1822)  BP (!) 128/98   Pulse 90   Temp 100 F (37.8 C) (Rectal)   Resp 16   Ht 6' (1.829 m)   Wt 127 kg   SpO2 96%   BMI 37.97 kg/m    GEN obese elderly caucasian male; resting comfortably in bed  HEENT NCAT EOM intact PERRL; clear oropharynx, no cervical LAD; moist mucus membranes  JVP estimated 7-8 cm H2O above RA; no HJR ; no carotid bruits b/l ;  CV irregular tachycardic; normal S1 and S2; no m/r/g or S3/S4; PMI non displaced; no parasternal heave  RESP CTA b/l; breathing unlabored and symmetric  ABD soft NT ND +normoactive BS  EXT warm throughout b/l; 1+ pitting edema R > L to upper shins b/l PULSES  DP and radials 2+ intact b/l  SKIN/MSK no rashes or lesions; R knee vertical scar with dressing in place, clean/dry/intact.  NEURO/PSYCH AAOx4;   No facial droop; no sensory deficit; no visual field cut No pronator drift; RLE motor  confounded by R knee surgery but overall 5/5 UE and LE Occasional speech slurring; able to say "secondhand" for watch naming but unable to name hour or minute hand. Oriented to name, time, place, and situation. Says "no ifs and or buts" clearly but slowly. Patient keeps repeating story that he had met me recently during his knee surgery.    DATA   LABS ON ADMISSION:  Basic Metabolic Panel: Recent Labs  Lab 10/09/18 0612 10/10/18 0547 10/13/18 1824  NA 133* 134* 132*  K 4.5 4.1 3.9  CL 99 98 97*  CO2 '26 30 26  ' GLUCOSE 365* 324* 352*  BUN 22 24* 18  CREATININE 0.93 0.98 0.72  CALCIUM 8.6* 8.7* 8.4*  MG  --   --  1.9   CBC: Recent Labs  Lab 10/09/18 0612 10/10/18 0547 10/11/18 0507 10/13/18 1824  WBC 9.8 14.8* 13.4* 13.3*  NEUTROABS  --   --   --  10.5*  HGB 10.6* 10.3* 10.7* 11.0*  HCT 32.5* 31.0* 32.7* 33.4*  MCV 104.2* 104.4* 104.8* 104.0*  PLT 605* 485* 493* 561*   Liver Function Tests: Recent Labs  Lab 10/13/18 1824  AST 24  ALT 24  ALKPHOS 70  BILITOT 1.7*  PROT 6.4*  ALBUMIN 3.3*   No results for input(s): LIPASE, AMYLASE in the last 168 hours. No results for input(s): AMMONIA in the last 168 hours. Coagulation:  Lab Results  Component Value Date   INR 1.23 10/03/2018   INR 1.15 08/09/2017   INR 1.07 11/02/2015   No results found for: PTT Lactic Acid, Venous:     Component Value Date/Time   LATICACIDVEN 1.8 10/13/2018 1829   Cardiac Enzymes: No results for input(s): CKTOTAL, CKMB, CKMBINDEX, TROPONINI in the last 168 hours. Urinalysis:    Component Value Date/Time   COLORURINE YELLOW 10/13/2018 2034   APPEARANCEUR CLEAR 10/13/2018 2034   LABSPEC 1.012 10/13/2018 2034   PHURINE 7.0 10/13/2018 2034   GLUCOSEU >=500 (A) 10/13/2018 2034   HGBUR NEGATIVE 10/13/2018 2034   [place] NEGATIVE 10/13/2018 2034   [place] NEGATIVE 10/13/2018 2034   PROTEINUR NEGATIVE 10/13/2018 2034   NITRITE NEGATIVE 10/13/2018 2034   LEUKOCYTESUR NEGATIVE  10/13/2018 2034    BNP (last  3 results) Recent Labs    05/30/18 1149  PROBNP 382*   CBG: Recent Labs  Lab 10/10/18 1634 10/10/18 2149 10/11/18 0722 10/11/18 1158 10/13/18 1830  GLUCAP 380* 161* 215* 243* 327*    Radiological Exams on Admission: Dg Chest 2 View  Result Date: 10/13/2018 CLINICAL DATA:  Confusion, hyperglycemia, congestion, cough, elevated heart rate. 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 Chest 05/25/2011 FINDINGS: Mild cardiac enlargement. No vascular congestion, consolidation, or edema. No blunting of costophrenic angles. No pneumothorax. Mediastinal contours appear intact. Degenerative changes in the spine. IMPRESSION: Mild cardiac enlargement. No evidence of active pulmonary disease. Electronically Signed   By: Lucienne Capers M.D.   On: 10/13/2018 20:00   Vas [place] Lower Extremity Venous (dvt) (only [facility])  Result Date: 10/13/2018  Lower Venous Study Indications: Knee Pain, and Recent total knee replacement.  Limitations: Edema. Comparison Study: No prior study on file Performing Technologist: Sharion Dove RVS  Examination Guidelines: A complete evaluation includes B-mode imaging, spectral Doppler, color Doppler, and power Doppler as needed of all accessible portions of each vessel. Bilateral testing is considered an integral part of a complete examination. Limited examinations for reoccurring indications may be performed as noted.  Right Venous Findings: +---------+---------------+---------+-----------+----------+--------------+          CompressibilityPhasicitySpontaneityPropertiesSummary        +---------+---------------+---------+-----------+----------+--------------+ CFV      Full           Yes      Yes                                 +---------+---------------+---------+-----------+----------+--------------+ SFJ      Full                                                         +---------+---------------+---------+-----------+----------+--------------+ FV Prox  Full                                                        +---------+---------------+---------+-----------+----------+--------------+ FV Mid   Full                                                        +---------+---------------+---------+-----------+----------+--------------+ FV DistalFull                                                        +---------+---------------+---------+-----------+----------+--------------+ PFV                                                   Not visualized +---------+---------------+---------+-----------+----------+--------------+ POP  Full           Yes      Yes                                 +---------+---------------+---------+-----------+----------+--------------+ PTV      Full                                                        +---------+---------------+---------+-----------+----------+--------------+ PERO     Full                                                        +---------+---------------+---------+-----------+----------+--------------+  Left Venous Findings: +---+---------------+---------+-----------+----------+-------+    CompressibilityPhasicitySpontaneityPropertiesSummary +---+---------------+---------+-----------+----------+-------+ CFVFull           Yes      Yes                          +---+---------------+---------+-----------+----------+-------+    Summary: Right: There is no evidence of deep vein thrombosis in the lower extremity. However, portions of this examination were limited- see technologist comments above. Left: No evidence of common femoral vein obstruction.  *See table(s) above for measurements and observations.    Preliminary     EKG: pending for admission;   Reviewed prior 11/18 afib with vent rate 99 bpm; non specific ST abnl  I have reviewed the patient's previous electronic chart  records, labs, and other data.   ASSESSMENT AND PLAN   Assessment: [name] is a 78 y.o. 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 Waxing and waning symptoms but more pronounced since morning of admission, per wife. Exam is notable for afib with RVR and mild hypervolemia; currently remains hemodynamically stable. Neuro exam is notable for very mild dysarthria and aphasia without significant motor defects. ED discussed with neurology on-call: will admit to [facility] for stat brain MRI given concern for stroke, likely small given relative paucity of symptoms at this time vs TIA or recrudescence or even delirium.   Active Problems:   Atrial fibrillation with RVR ([place])   Dysarthria   Plan:   # Dysarthria and naming aphasia (waxing and waning), increased confusion noted per family x 1 week with acute worsening of confusion x 24 hours. Concern for stroke (especially since anticoag was recently held for knee surg), TIA, recrudescence of prior stroke, delirium in setting of recent surgery and pain meds > no motor deficits on initial exam; only mild, intermittent dysarthria and subtle aphasia - admit/transfer to [facility] - brain MRI w/wo contrast - neurology on-call aware; appreciate recs - low threshold to obtain CT head if neuro exam changes - continue PO anticoag for afib as below - BP goal < 782 systolics  # Afib with RVR with hx of chronic afib - diltiazem drip titrate to HR < 110s - allow permissive tachycardia if sBP < 120 with dilt - continue PO lasix 60m qd -  consider spot dose with IV lasix tomorrow pending exam and BP - telemetry - high goal electrolyte repletion - hold off fluid resusc  # Recent R knee replacement - tylenol 674m q6h prn pain - pain control with oxycodone [id number]  prn - resume robaxin [id number] prn - caution with adding further pain meds given concern for delirium - PT and OT eval ordered (was receiving home PT)  # Hx of HTN and HLD  - resume statin  - hold home benazepril given recent BP 120/80s   # IDDM (DMT2) on NPH 70/30 44 units BID > sugars in 300s on admission - NPH 20 units overnight - ordered NPH 20 units BID - fingersticks q4h overnight - after MRI and when NPO can resume ACHS fingersticks and sliding scale  # Peripheral neuropathy > on home gabapentin 600m78ms prn nerve pain - reduced gabapentin dose to 300mg76m prn nerve pain given concern for delirium  # Mild anemia, macrocytic. Likely multifactorial in setting of recent surgery > Hb 11.0 (up from 10.7 two days ago) - daily CBC - cont multivit - monitor as outpatient  # Hx of thrombocytosis > plts in 500s on admission, near baseline - resume home hydrea 1000mg 71my   DVT Prophylaxis: on eliquis Code Status:  Full Code Family Communication: discussed with patient, wife, and son at bedside  [facility]    Patient contact: Extended Emergency Contact Information Primary Emergency Contact: Nolet,Anne Address: [address]    [place]: [phone]: Spouse  Time spent: > 35 mins  [facility] Pager [phone]-7AM, please contact night-coverage [email or web address] Password [email or web address] 2[email or web address], 11:58 PM

## 2018-10-13 NOTE — ED Notes (Signed)
Attempted to call [facility] 2 C for report, receiving Nurse Bethena Roys unavailable at this time. To call back in a few.

## 2018-10-14 ENCOUNTER — Inpatient Hospital Stay (HOSPITAL_COMMUNITY): Payer: Medicare HMO

## 2018-10-14 ENCOUNTER — Other Ambulatory Visit: Payer: Self-pay

## 2018-10-14 ENCOUNTER — Encounter (HOSPITAL_COMMUNITY): Payer: Self-pay

## 2018-10-14 DIAGNOSIS — R471 Dysarthria and anarthria: Secondary | ICD-10-CM | POA: Diagnosis present

## 2018-10-14 DIAGNOSIS — R4781 Slurred speech: Secondary | ICD-10-CM | POA: Diagnosis not present

## 2018-10-14 DIAGNOSIS — I4891 Unspecified atrial fibrillation: Secondary | ICD-10-CM | POA: Diagnosis not present

## 2018-10-14 DIAGNOSIS — R410 Disorientation, unspecified: Secondary | ICD-10-CM | POA: Diagnosis not present

## 2018-10-14 LAB — GLUCOSE, CAPILLARY
Glucose-Capillary: 267 mg/dL — ABNORMAL HIGH (ref 70–99)
Glucose-Capillary: 301 mg/dL — ABNORMAL HIGH (ref 70–99)
Glucose-Capillary: 323 mg/dL — ABNORMAL HIGH (ref 70–99)
Glucose-Capillary: 410 mg/dL — ABNORMAL HIGH (ref 70–99)

## 2018-10-14 LAB — BILIRUBIN, FRACTIONATED(TOT/DIR/INDIR)
Bilirubin, Direct: 0.4 mg/dL — ABNORMAL HIGH (ref 0.0–0.2)
Indirect Bilirubin: 0.9 mg/dL (ref 0.3–0.9)
Total Bilirubin: 1.3 mg/dL — ABNORMAL HIGH (ref 0.3–1.2)

## 2018-10-14 LAB — LACTIC ACID, PLASMA: Lactic Acid, Venous: 1.4 mmol/L (ref 0.5–1.9)

## 2018-10-14 MED ORDER — INSULIN NPH (HUMAN) (ISOPHANE) 100 UNIT/ML ~~LOC~~ SUSP
44.0000 [IU] | Freq: Two times a day (BID) | SUBCUTANEOUS | Status: DC
  Filled 2018-10-14: qty 10

## 2018-10-14 MED ORDER — INSULIN ASPART 100 UNIT/ML ~~LOC~~ SOLN
0.0000 [IU] | Freq: Three times a day (TID) | SUBCUTANEOUS | Status: DC
  Administered 2018-10-14: 20 [IU] via SUBCUTANEOUS

## 2018-10-14 MED ORDER — SENNOSIDES-DOCUSATE SODIUM 8.6-50 MG PO TABS
1.0000 | ORAL_TABLET | Freq: Two times a day (BID) | ORAL | Status: DC | PRN
  Administered 2018-10-14: 1 via ORAL
  Filled 2018-10-14: qty 1

## 2018-10-14 MED ORDER — INSULIN ASPART 100 UNIT/ML ~~LOC~~ SOLN: 6.0000 [IU] | Freq: Three times a day (TID) | SUBCUTANEOUS | Status: DC

## 2018-10-14 MED ORDER — BISACODYL 10 MG RE SUPP
10.0000 mg | Freq: Once | RECTAL | Status: AC
  Administered 2018-10-14: 10 mg via RECTAL
  Filled 2018-10-14: qty 1

## 2018-10-14 MED ORDER — POLYETHYLENE GLYCOL 3350 17 G PO PACK
17.0000 g | PACK | Freq: Every day | ORAL | Status: DC
  Administered 2018-10-14: 17 g via ORAL
  Filled 2018-10-14: qty 1

## 2018-10-14 MED ORDER — INSULIN ASPART 100 UNIT/ML ~~LOC~~ SOLN: 0.0000 [IU] | Freq: Three times a day (TID) | SUBCUTANEOUS | 0 refills | Status: DC

## 2018-10-14 MED ORDER — METOPROLOL TARTRATE 25 MG PO TABS: 50.0000 mg | ORAL_TABLET | Freq: Two times a day (BID) | ORAL | 3 refills | Status: DC

## 2018-10-14 MED ORDER — ACETAMINOPHEN 500 MG PO TABS
1000.0000 mg | ORAL_TABLET | Freq: Three times a day (TID) | ORAL | Status: DC
  Administered 2018-10-14: 1000 mg via ORAL
  Filled 2018-10-14: qty 2

## 2018-10-14 MED ORDER — FUROSEMIDE 40 MG PO TABS
40.0000 mg | ORAL_TABLET | Freq: Every day | ORAL | Status: DC
  Administered 2018-10-14: 40 mg via ORAL
  Filled 2018-10-14: qty 1

## 2018-10-14 MED ORDER — DILTIAZEM HCL-DEXTROSE 100-5 MG/100ML-% IV SOLN (PREMIX)
5.0000 mg/h | INTRAVENOUS | Status: DC
  Administered 2018-10-14: 10 mg/h via INTRAVENOUS
  Filled 2018-10-14: qty 100

## 2018-10-14 MED ORDER — METOPROLOL TARTRATE 50 MG PO TABS
50.0000 mg | ORAL_TABLET | Freq: Two times a day (BID) | ORAL | Status: DC
  Administered 2018-10-14: 50 mg via ORAL
  Filled 2018-10-14: qty 1

## 2018-10-14 MED ORDER — INSULIN ASPART 100 UNIT/ML ~~LOC~~ SOLN: 0.0000 [IU] | Freq: Every day | SUBCUTANEOUS | Status: DC

## 2018-10-14 MED ORDER — ACETAMINOPHEN 325 MG PO TABS
650.0000 mg | ORAL_TABLET | Freq: Four times a day (QID) | ORAL | Status: DC | PRN
  Administered 2018-10-14: 650 mg via ORAL
  Filled 2018-10-14: qty 2

## 2018-10-14 NOTE — Progress Notes (Signed)
Orthopedic Tech Progress Note Patient Details:  [name] 1940/11/07 [id number] Therapy called and asked me to come down and put this patient on a CPM and a knee immobilizer, but the patient refused and said he was going home once the 2nd DR came in and talked to him around 2/3 this afternoon. I notified the RN and she said she's going to talk to him. I told her just to call me back and ill come down to put him on these items [name], male   DOB: 14-Aug-1941, 78 y.o.   MRN: [id number]   Janit Pagan 10/14/2018, 9:13 AM

## 2018-10-14 NOTE — Consult Note (Addendum)
Requesting Physician: Dr. Sherry Ruffing    Chief Complaint: Confusion and slurred speech  History obtained from: Patient and Chart    HPI:                                                                                                                                       [name] is an 78 y.o. male with past medical history of stroke, atrial fibrillation on Eliquis, hypertension, diabetes mellitus on insulin, thrombocytosis and recent knee replacement surgery for which she was off his Eliquis for 2 days presents to the ED constipation and "Feeling out of it."  as well as pain below his right knee. My concern that he has been intermittently confused, has been having word finding difficulty and slurred speech.  No focal deficits were noted on his examination in the EDP however neurology was consulted due to concern of new stroke given the patient has been off his anticoagulation for surgery.  Noted to be in A. fib with RVR while in the emergency department.   Date last known well: 2.1.20 tPA Given: no, mild symptoms and recent surgery NIHSS: 0 Baseline MRS 0    Past Medical History:  Diagnosis Date  . Anxiety   . Arthritis   . Cancer (HCC)    skin - basil cell  . Depression   . Diabetes mellitus without complication ([place])   . Dysrhythmia    a-fib  . GERD (gastroesophageal reflux disease)   . Hyperlipidemia   . Hypertension   . Neuropathy   . Obesity   . Paroxysmal atrial fibrillation (HCC)   . Peripheral vascular disease ([place])    diabetic neuropathy in both feet  . Sleep apnea    uses C-pap machine  . Stroke [facility])    08/09/2017    Past Surgical History:  Procedure Laterality Date  . APPENDECTOMY  1962  . BACK SURGERY  00-02-12   x3  . BASAL CELL CARCINOMA EXCISION  93/06/10  . COLONOSCOPY    . KNEE ARTHROSCOPY  005/01/02  . 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   Procedure: TOTAL LEFT KNEE ARTHROPLASTY;  Surgeon: [name], MD;  Location: [facility];  Service:  Orthopedics;  Laterality: Left;  . TOTAL KNEE ARTHROPLASTY Right 10/08/2018   Procedure: RIGHT TOTAL KNEE ARTHROPLASTY;  Surgeon: [name], MD;  Location: [facility];  Service: Orthopedics;  Laterality: Right;  64min    Family History  Problem Relation Age of Onset  . Cancer Mother   . Diabetes Mellitus II Mother   . Hypertension Mother   . Heart failure Father   . CVA Father   . Hypertension Sister   . Colon cancer Neg Hx    Social History:  reports that he quit smoking about 26 years ago. His smoking use included cigars. He has a 30.00 pack-year smoking history. He has never used smokeless tobacco. He reports current alcohol use.  He reports that he does not use drugs.  Allergies: No Known Allergies  Medications:                                                                                                                        I reviewed home medications   ROS:                                                                                                                                     14 systems reviewed and negative except above    Examination:                                                                                                      General: Appears well-developed, obese Psych: Affect appropriate to situation Eyes: No scleral injection HENT: No OP obstrucion Head: Normocephalic.  Cardiovascular: Normal rate and regular rhythm.  Respiratory: Effort normal and breath sounds normal to anterior ascultation GI: Soft.  No distension. There is no tenderness.  Skin:  scar over the right knee    Neurological Examination Mental Status: Alert, oriented to himself and place, slightly confused.  thought content appropriate.  Speech fluent without evidence of aphasia. Able to follow 3 step commands without difficulty. Cranial Nerves: II: Visual fields grossly normal,  III,IV, VI: ptosis not present, extra-ocular motions intact bilaterally, pupils equal, round,  reactive to light and accommodation V,VII: smile symmetric, facial light touch sensation normal bilaterally VIII: hearing normal bilaterally IX,X: uvula rises symmetrically XI: bilateral shoulder shrug XII: midline tongue extension Motor: Right : Upper extremity   5/5    Left:     Upper extremity   5/5  Lower extremity   5/5     Lower extremity   5/5 Tone and bulk:normal tone throughout; no atrophy noted Sensory: Pinprick and light touch intact throughout, bilaterally Plantars: Right: downgoing   Left: downgoing Cerebellar: normal finger-to-nose, normal rapid alternating movements and normal heel-to-shin test Gait: normal gait and station  Lab Results: Basic Metabolic Panel: Recent Labs  Lab 10/09/18 0612 10/10/18 0547 10/13/18 1824  NA 133* 134* 132*  K 4.5 4.1 3.9  CL 99 98 97*  CO2 26 30 26   GLUCOSE 365* 324* 352*  BUN 22 24* 18  CREATININE 0.93 0.98 0.72  CALCIUM 8.6* 8.7* 8.4*  MG  --   --  1.9    CBC: Recent Labs  Lab 10/09/18 0612 10/10/18 0547 10/11/18 0507 10/13/18 1824  WBC 9.8 14.8* 13.4* 13.3*  NEUTROABS  --   --   --  10.5*  HGB 10.6* 10.3* 10.7* 11.0*  HCT 32.5* 31.0* 32.7* 33.4*  MCV 104.2* 104.4* 104.8* 104.0*  PLT 605* 485* 493* 561*    Coagulation Studies: No results for input(s): LABPROT, INR in the last 72 hours.  Imaging: Dg Chest 2 View  Result Date: 10/13/2018 CLINICAL DATA:  Confusion, hyperglycemia, congestion, cough, elevated heart rate. 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 Chest 05/25/2011 FINDINGS: Mild cardiac enlargement. No vascular congestion, consolidation, or edema. No blunting of costophrenic angles. No pneumothorax. Mediastinal contours appear intact. Degenerative changes in the spine. IMPRESSION: Mild cardiac enlargement. No evidence of active pulmonary disease. Electronically Signed   By: Lucienne Capers M.D.   On: 10/13/2018 20:00   [name]  Contrast  Result Date: 10/14/2018 CLINICAL DATA:  Confusion, slurred speech after knee placement October 08, 2018. history of hypertension, hyperlipidemia, diabetes and stroke. EXAM: MRI HEAD WITHOUT CONTRAST TECHNIQUE: Multiplanar, multiecho pulse sequences of the brain and surrounding structures were obtained without intravenous contrast. COMPARISON:  MRI head August 10, 2017 FINDINGS: INTRACRANIAL CONTENTS: No reduced diffusion to suggest acute ischemia. Moderate to severe parenchymal brain volume loss. Old LEFT basal ganglia small infarcts. No hydrocephalus. Faint susceptibility artifact associated with LEFT frontal encephalomalacia. Patchy pontine and confluent supratentorial white matter FLAIR T2 hyperintensities. Old small RIGHT cerebellar infarct. Old small RIGHT thalamus lacunar infarct. Prominent basal ganglia and thalami perivascular spaces associated with chronic small vessel ischemic changes. No suspicious parenchymal signal, masses, mass effect. No abnormal extra-axial fluid collections. No extra-axial masses. VASCULAR: Normal major intracranial vascular flow voids present at skull base. SKULL AND UPPER CERVICAL SPINE: No abnormal sellar expansion. No suspicious calvarial bone marrow signal. Craniocervical junction maintained. SINUSES/ORBITS: Trace RIGHT mastoid effusion. Mild maxillary sinus mucosal thickening without paranasal sinus air-fluid levels.The included ocular globes and orbital contents are non-suspicious. OTHER: None. IMPRESSION: 1. No acute intracranial process. 2. 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 3. Moderate chronic small vessel ischemic changes. 4. Moderate to severe parenchymal brain volume loss. Electronically Signed   By: Elon Alas M.D.   On: 10/14/2018 02:45   Vas [place] Lower Extremity Venous (dvt) (only [facility])  Result Date: 10/13/2018  Lower Venous Study Indications: Knee Pain, and  Recent total knee replacement.  Limitations: Edema. Comparison Study: No prior study on file Performing Technologist: Sharion Dove RVS  Examination Guidelines: A complete evaluation includes B-mode imaging, spectral Doppler, color Doppler, and power Doppler as needed of all accessible portions of each vessel. Bilateral testing is considered an integral part of a complete examination. Limited examinations for reoccurring indications may be performed as noted.  Right Venous Findings: +---------+---------------+---------+-----------+----------+--------------+          CompressibilityPhasicitySpontaneityPropertiesSummary        +---------+---------------+---------+-----------+----------+--------------+ CFV      Full  Yes      Yes                                 +---------+---------------+---------+-----------+----------+--------------+ SFJ      Full                                                        +---------+---------------+---------+-----------+----------+--------------+ FV Prox  Full                                                        +---------+---------------+---------+-----------+----------+--------------+ FV Mid   Full                                                        +---------+---------------+---------+-----------+----------+--------------+ FV DistalFull                                                        +---------+---------------+---------+-----------+----------+--------------+ PFV                                                   Not visualized +---------+---------------+---------+-----------+----------+--------------+ POP      Full           Yes      Yes                                 +---------+---------------+---------+-----------+----------+--------------+ PTV      Full                                                        +---------+---------------+---------+-----------+----------+--------------+ PERO     Full                                                         +---------+---------------+---------+-----------+----------+--------------+  Left Venous Findings: +---+---------------+---------+-----------+----------+-------+    CompressibilityPhasicitySpontaneityPropertiesSummary +---+---------------+---------+-----------+----------+-------+ CFVFull           Yes      Yes                          +---+---------------+---------+-----------+----------+-------+    Summary: Right: There is no evidence of deep vein thrombosis in the lower extremity. However, portions of this examination were limited- see technologist comments  above. Left: No evidence of common femoral vein obstruction.  *See table(s) above for measurements and observations.    Preliminary      ASSESSMENT AND PLAN  78 year old male past medical history of atrial fibrillation with RVR off his Eliquis for recent knee surgery scented to the emergency room increased confusion, slurring of speech and blurry vision.  On pain medications following the surgery. Was also noted to be in A. fib with RVR.  Neurology was consulted for concern for new stroke given patient is off his anticoagulation.  MRI brain was negative for any acute findings.  Patient feels slightly confused, but has no focal deficits. Patient has known left M2 stenosis.  Last echo was in 2018.    Reactivation of old stroke symptoms in the setting of atrial fibrillation with RVR Possible cerebral hypoperfusion in the setting of left M2 stenosis Toxic Metabolic Encephalopathy, possible from pain medications   MRI brain negative for acute infarct Resume Eliquis for secondary prevention of stroke in setting of atrial fibrillation Blood pressure goal 140/90 mmHg, avoid hypotension Control A. fib with RVR Repeat echocardiogram, please call back neurology if thrombus is present.  Full stroke work-up not required as patient does not have new stroke.  Neurology will sign  off.  Alexea Blase [facility] Pager Number [phone]

## 2018-10-14 NOTE — Progress Notes (Addendum)
Re-paged Md.  Pt stated " feel nausea from not eating".  Requesting food.  New orders received.  Will continue to monitor [name] T

## 2018-10-14 NOTE — Evaluation (Signed)
Physical Therapy Evaluation Patient Details Name: [name] MRN: [id number] DOB: 07/23/1941 Today's Date: 10/14/2018   History of Present Illness  Pt is a 78 y.o. M with significant PMH of stroke, chronic afib on eliquid, HTN, DM2, neuropathy, and recent R knee replacement (1/27) who presents with confusion and mild speech slurring. MRI negative for acute stroke.  Clinical Impression  Pt admitted with above diagnosis. Pt currently with functional limitations due to the deficits listed below (see PT Problem List). Patient presents with decreased functional mobility secondary to extremely limited right knee ROM, decreased RLE strength, gait abnormalities, pain, and balance impairments. Requiring up to moderate assistance for transfers from bed to chair using walker. Further ambulation deferred secondary to lack of knee immobilizer (pt at high risk for knee buckle). Ordered KI through ortho tech, but pt refused to don, stating, "I'm going home today." Pt will benefit from skilled PT to increase their independence and safety with mobility to allow discharge to the venue listed below.       Follow Up Recommendations Home health PT;Supervision for mobility/OOB    Equipment Recommendations  3in1 (PT)    Recommendations for Other Services       Precautions / Restrictions Precautions Precautions: Fall Required Braces or Orthoses: Knee Immobilizer - Right Knee Immobilizer - Right: Discontinue once straight leg raise with < 10 degree lag;On when out of bed or walking Restrictions Weight Bearing Restrictions: No      Mobility  Bed Mobility Overal bed mobility: Needs Assistance Bed Mobility: Supine to Sit     Supine to sit: Min assist     General bed mobility comments: minA for RLE negotiation  Transfers Overall transfer level: Needs assistance Equipment used: Rolling walker (2 wheeled) Transfers: Sit to/from [facility] Sit to Stand: Mod assist Stand pivot  transfers: Min guard       General transfer comment: ModA for lift assist to stand. Cues for right foot placement. Min guard for stand pivot towards left with cues for sequencing  Ambulation/Gait                Stairs            Wheelchair Mobility    Modified Rankin (Stroke Patients Only)       Balance Overall balance assessment: Needs assistance Sitting-balance support: Feet supported Sitting balance-Leahy Scale: Good     Standing balance support: Bilateral upper extremity supported Standing balance-Leahy Scale: Poor Standing balance comment: heavy reliance on RW for support                             Pertinent Vitals/Pain Pain Assessment: Faces Faces Pain Scale: Hurts even more Pain Location: R knee Pain Descriptors / Indicators: Aching;Grimacing;Guarding Pain Intervention(s): Monitored during session    Home Living Family/patient expects to be discharged to:: Private residence Living Arrangements: Spouse/significant other Available Help at Discharge: Family;Available 24 hours/day Type of Home: House Home Access: Stairs to enter Entrance Stairs-Rails: Psychiatric nurse of Steps: 3 Home Layout: Multi-level Home Equipment: Cane - single point;Other (comment);Walker - 2 wheels(walking stick)      Prior Function Level of Independence: Independent with assistive device(s)         Comments: occasionally used cane/walking stick for ambulation as needed prior to R TKA      Hand Dominance   Dominant Hand: Right    Extremity/Trunk Assessment   Upper Extremity Assessment Upper Extremity Assessment: Overall [facility]  for tasks assessed    Lower Extremity Assessment Lower Extremity Assessment: RLE deficits/detail RLE Deficits / Details: heel slide to ~25 degrees, weak quad set RLE Sensation: history of peripheral neuropathy    Cervical / Trunk Assessment Cervical / Trunk Assessment: Normal  Communication    Communication: No difficulties  Cognition Arousal/Alertness: Awake/alert Behavior During Therapy: WFL for tasks assessed/performed Overall Cognitive Status: History of cognitive impairments - at baseline                                 General Comments: memory issues per past notes      General Comments      Exercises Total Joint Exercises Quad Sets: AROM;Right;10 reps;Supine Heel Slides: AAROM;Right;10 reps;Supine Hip ABduction/ADduction: AROM;Right;10 reps;Supine Straight Leg Raises: AAROM;Right;10 reps;Supine   Assessment/Plan    PT Assessment Patient needs continued PT services  PT Problem List Decreased strength;Pain;Decreased range of motion;Decreased activity tolerance;Decreased knowledge of use of DME;Decreased balance;Decreased safety awareness;Decreased mobility       PT Treatment Interventions DME instruction;Therapeutic activities;Therapeutic exercise;Gait training;Patient/family education;Balance training;Stair training;Functional mobility training    PT Goals (Current goals can be found in the Care Plan section)  Acute Rehab PT Goals Patient Stated Goal: "go home today." PT Goal Formulation: With patient Time For Goal Achievement: 10/28/18 Potential to Achieve Goals: Good    Frequency Min 5X/week   Barriers to discharge        Co-evaluation               AM-PAC PT "6 Clicks" Mobility  Outcome Measure Help needed turning from your back to your side while in a flat bed without using bedrails?: A Little Help needed moving from lying on your back to sitting on the side of a flat bed without using bedrails?: A Little Help needed moving to and from a bed to a chair (including a wheelchair)?: A Little Help needed standing up from a chair using your arms (e.g., wheelchair or bedside chair)?: A Lot Help needed to walk in hospital room?: A Little Help needed climbing 3-5 steps with a railing? : A Lot 6 Click Score: 16    End of Session  Equipment Utilized During Treatment: Gait belt Activity Tolerance: Patient tolerated treatment well Patient left: in chair;with call bell/phone within reach Nurse Communication: Mobility status PT Visit Diagnosis: Other abnormalities of gait and mobility (R26.89);Difficulty in walking, not elsewhere classified (R26.2)    Time: 7169-6789 PT Time Calculation (min) (ACUTE ONLY): 27 min   Charges:   PT Evaluation $PT Eval Moderate Complexity: 1 Mod PT Treatments $Therapeutic Exercise: 8-22 mins       Ellamae Sia, PT, DPT Acute Rehabilitation Services Pager [phone] Office [phone]  Willy Eddy 10/14/2018, 9:33 AM

## 2018-10-14 NOTE — Evaluation (Signed)
Occupational Therapy Evaluation Patient Details Name: [name] MRN: [id number] DOB: 03/22/1941 Today's Date: 10/14/2018    History of Present Illness Pt is a 78 y.o. M with significant PMH of stroke, chronic afib on eliquid, HTN, DM2, neuropathy, and recent R knee replacement (1/27) who presents with confusion and mild speech slurring. MRI negative for acute stroke.   Clinical Impression   Pt is a 78 yo male s/p above dx. Pt with fresh R TKA performed 1/27 and feeling increased pain and decreased mobility in RLE. Pt PTA: pt performing ADLs with spouse assist and mobility with RW- managing stairs in 3 story home. Pt currently, limited by pain in R knee and stiffness in joint. Pt set-upA for UB ADL and modA for LB ADL including toilet hygiene in standing. Pt advised to use 1 hand for holding onto RW for balance while the other is performing a task. Pt would benefit from continued OT skilled services for ADL, mobility and safety in [place] setting.    Follow Up Recommendations  Home health OT;Supervision/Assistance - 24 hour    Equipment Recommendations  None recommended by OT    Recommendations for Other Services       Precautions / Restrictions Precautions Precautions: Fall Precaution Comments: spouse aware pt should be wearing KI for standing and mobilizing at home for safety Required Braces or Orthoses: Knee Immobilizer - Right Knee Immobilizer - Right: Discontinue once straight leg raise with < 10 degree lag;On when out of bed or walking Restrictions Weight Bearing Restrictions: No      Mobility Bed Mobility Overal bed mobility: Needs Assistance Bed Mobility: Supine to Sit     Supine to sit: Min assist     General bed mobility comments: received in recliner  Transfers Overall transfer level: Needs assistance Equipment used: Rolling walker (2 wheeled) Transfers: Sit to/from [facility] Sit to Stand: Min guard;From elevated surface Stand pivot  transfers: Min guard       General transfer comment: Able to perform with minguardA at this time.    Balance Overall balance assessment: Needs assistance Sitting-balance support: Feet supported Sitting balance-Leahy Scale: Good     Standing balance support: Bilateral upper extremity supported Standing balance-Leahy Scale: Poor Standing balance comment: heavy reliance on RW for support                           ADL either performed or assessed with clinical judgement   ADL Overall ADL's : Needs assistance/impaired Eating/Feeding: Independent   Grooming: Set up;Sitting   Upper Body Bathing: Set up;Sitting   Lower Body Bathing: Moderate assistance;Sitting/lateral leans;Sit to/from stand   Upper Body Dressing : Set up;Sitting   Lower Body Dressing: Moderate assistance;Sit to/from stand;Sitting/lateral leans   Toilet Transfer: Min guard;BSC   Toileting- Clothing Manipulation and Hygiene: Moderate assistance;Sitting/lateral lean;Sit to/from stand       Functional mobility during ADLs: Min guard;Rolling walker General ADL Comments: Pt modA for toilet hygiene in standing; otherwise ModA for LB due to pain and stiffness in RLE; set-upA for UB ADL     Vision Baseline Vision/History: No visual deficits Vision Assessment?: No apparent visual deficits     Perception     Praxis      Pertinent Vitals/Pain Pain Assessment: 0-10 Pain Score: 6  Faces Pain Scale: Hurts even more Pain Location: R knee Pain Descriptors / Indicators: Aching;Grimacing;Guarding Pain Intervention(s): Limited activity within patient's tolerance;Monitored during session     Hand  Dominance Right   Extremity/Trunk Assessment Upper Extremity Assessment Upper Extremity Assessment: Overall WFL for tasks assessed   Lower Extremity Assessment Lower Extremity Assessment: Generalized weakness RLE Deficits / Details: s/p TKA 10/08/2018 RLE Sensation: history of peripheral neuropathy    Cervical / Trunk Assessment Cervical / Trunk Assessment: Normal   Communication Communication Communication: No difficulties   Cognition Arousal/Alertness: Awake/alert Behavior During Therapy: WFL for tasks assessed/performed Overall Cognitive Status: History of cognitive impairments - at baseline                                 General Comments: requires increased time for short term memory   General Comments  Pt tolerable this session for OT. No immobilizer present and per RN, the MD discontinuing at this time. No knee buckling noted; pt cautious and only ambulating 20' at a time with RW.     Exercises Exercises: Total Joint Total Joint Exercises Quad Sets: AROM;Right;10 reps;Supine Heel Slides: AAROM;Right;10 reps;Supine Hip ABduction/ADduction: AROM;Right;10 reps;Supine Straight Leg Raises: AAROM;Right;10 reps;Supine   Shoulder Instructions      Home Living Family/patient expects to be discharged to:: Private residence Living Arrangements: Spouse/significant other Available Help at Discharge: Family;Available 24 hours/day Type of Home: House Home Access: Stairs to enter [employer] of Steps: 3 Entrance Stairs-Rails: Right;Left Home Layout: Multi-level Alternate Level Stairs-Number of Steps: 7 steps to bedroom  Alternate Level Stairs-Rails: Left Bathroom Shower/Tub: Occupational psychologist: Handicapped height Bathroom Accessibility: Yes   Home Equipment: [place] - single point;Walker - 2 wheels;Shower seat;Hand held shower head;Other (comment)(walking stick)          Prior Functioning/Environment Level of Independence: Independent with assistive device(s)        Comments: occasionally used cane/walking stick for ambulation as needed prior to R TKA         OT Problem List: Decreased strength;Decreased activity tolerance;Impaired balance (sitting and/or standing);Decreased safety awareness      OT Treatment/Interventions:       OT Goals(Current goals can be found in the care plan section) Acute Rehab OT Goals Patient Stated Goal: "go home today."  OT Frequency:     Barriers to D/C:            Co-evaluation              AM-PAC OT "6 Clicks" Daily Activity     Outcome Measure Help from another person eating meals?: None Help from another person taking care of personal grooming?: None Help from another person toileting, which includes using toliet, bedpan, or urinal?: A Lot Help from another person bathing (including washing, rinsing, drying)?: A Lot Help from another person to put on and taking off regular upper body clothing?: A Little Help from another person to put on and taking off regular lower body clothing?: A Lot 6 Click Score: 17   End of Session Equipment Utilized During Treatment: Gait belt;Rolling walker Nurse Communication: Mobility status;Patient requests pain meds  Activity Tolerance: Patient tolerated treatment well Patient left: in chair;with call bell/phone within reach  OT Visit Diagnosis: Unsteadiness on feet (R26.81);Muscle weakness (generalized) (M62.81)                Time: 0175-1025 OT Time Calculation (min): 55 min Charges:  OT General Charges $OT Visit: 1 Visit OT Evaluation $OT Eval Moderate Complexity: 1 Mod OT Treatments $Self Care/Home Management : 23-37 mins $Neuromuscular Re-education: 8-22 mins  Ebony Hail Harold Hedge)  [facility] OTR/L Acute Rehabilitation Services Pager: [phone] Office: [phone]   Fredda Hammed 10/14/2018, 11:39 AM

## 2018-10-14 NOTE — Care Management CC44 (Signed)
Condition Code 44 Documentation Completed  Patient Details  Name: [name] MRN: [id number] Date of Birth: 01/04/1941   Condition Code 44 given:  Yes Patient signature on Condition Code 44 notice:  Yes Documentation of 2 MD's agreement:  Yes Code 44 added to claim:  Yes    Claudie Leach, RN 10/14/2018, 1:44 PM   Patient states [name] visited once. Pt has their contact info.

## 2018-10-14 NOTE — Discharge Summary (Addendum)
Physician Discharge Summary  [name]:[id number] DOB: January 05, 1941 DOA: 10/13/2018  PCP: [name], MD  Admit date: 10/13/2018 Discharge date: 10/14/2018  Admitted From: Home  Disposition:  Home   Recommendations for Outpatient Follow-up:  1. Follow up with PCP in 1-2 weeks regarding new metoprolol 2. Follow up with Orthopedics as previously scheduled 3. Please obtain BMP/CBC in one week      Home Health: Renewed  Equipment/Devices: 3 in 1, rolling walker  Discharge Condition: Fair  CODE STATUS: FULL Diet recommendation: Diabetic, Cardiac  Brief/Interim Summary: [name] is a 78 y.o. 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   Patient had recent R knee surgery, uncomplicated post-op course, discharged 2 days prior to this presentation.  In that time, he has been home, still no BM since surgery, glucoses high, and taking new oxycodone, Robaxin on top of his chronic alprazolam and gabapentin.  Intermittently, family noted patient to be inattentive, disoriented, have speech slurring similar to previous stroke presentation.   Finally, family encouraged him to come to the ER.  In ER, main focus initially just constipation, patient appeared neurologically intact to ER provider.  Family arrived later and explained mental status changes, and so MRI brain was recommended and the hospitalist service were asked to evaluate overnight.       PRINCIPAL HOSPITAL DIAGNOSIS: Toxic encephalopathy    Discharge Diagnoses:   Toxic encephalopathy Initially presented with nonspecific neurologic findings, disorientation, inattentiveness.  Leukocytosis improving from discharge, CXR and urinalysis completely normal.  BNP and TSH normal.  MRI brain ruled out stroke.    Mental status resolved to normal this  morning.  Patient oriented, attentive.  Patient correlates his symptoms to having started oxycodone, in addition to Xanax, a muscle relaxer and his home gabapentin and this is consistent with what appears to me clearly a medicaiton-related toxic encephalopathy.  I have no suspicion for TIA.  Neurology have evaluated and suspect if anything ischemic, recrudescence of old stroke symptoms in setting of Afib with RVR.     Paroxysmal atrial fibrillation with RVR Resolved, rates 90s today. -Continue Eliquis -Start new metoprolol -Follow up with PCP in 1 week  Type 2 diabetes with polyneuropathy, poorly controlled, insulin dependent Glucose high at admission, but only started on half home dose. -Home 70/30 dose restarted at discharge, resume metformin  Recent knee surgery Follow up with Orthopedics as recommended  Hypertension  Constipation Discharged only on docusate. -Dulcolax suppository now -Start Senokot-S -Miralax PRN  Hyperbilirubinemia Minimal elevation.  No RUQ symptoms at all.  Unclear etiology, but appears benign, resolving self-limited.      Discharge Instructions  Discharge Instructions    Diet Carb Modified   Complete by:  As directed    Discharge instructions   Complete by:  As directed    From Dr. Loleta Books: You were admitted for feeling somewhat disoriented. There was initial concern for a possible stroke which was reasonable, but thankfully, this was ruled out with an MRI of the brain.  I suspect instead, the confusion was related to the oxycodone, tramadol, Robaxin, and Xanax you have at home.  Going forward: Do not take Robaxin (the muscle relaxer, also known as methocarbamol) unless ABSOLUTELY necessary Do not take Xanax (the anxiety medicine or sleep medicine, also known as alprazolam) unless ABSOLUTELY NECESSARY Do not take Tramadol (a pain  medicine) unless ABSOLUTELY necessary   For pain from your knee surgery: Go ahead and take  acetaminophen/Tylenol 1000 mg three times daily on a schedule If you need extra pain relief, start with oxycodone 2.5 mg (half tablet)  Use oxycodone as sparingly as possible If oxycodone 2.5 mg helps you get adequate pain relief, STOP THERE If not, take a larger dose the next time (oxycodone 5 mg, a whole tablet)  Use half your normal dose of gabapentin for now, until you are off oxycodone   Oxycodone and tramadol will make you very constipated.  While you are taking oxycodone, use Senokot-S twice daily If you are still not having a BM at least once daily, add Miralax (17g in a full glass of water) once daily or every other day    Resume your home insulin. For the next week or so, use the Novolog with meals according to the following sliding scale: Check your blood sugar with EVERY MEAL If your sugar is:  Less than 200: No novolog Glucose 201-250 mg/dL: Take 3 units Novolog Glucose 251-300 mg/dL: Take 5 units Novolog Glucose 301-350 mg/dL: Take 8 units Novolog Glucose 351-400 mg/dL: Take 11 units Novolog Glucose 401+ mg/dL: Take 15 units Novolog   Increase activity slowly   Complete by:  As directed      Allergies as of 10/14/2018   No Known Allergies     Medication List    TAKE these medications   ALPRAZolam 0.5 MG tablet Commonly known as:  XANAX Take 0.5 mg by mouth 2 (two) times daily.   apixaban 5 MG Tabs tablet Commonly known as:  ELIQUIS Take 1 tablet (5 mg total) by mouth 2 (two) times daily.   atorvastatin 80 MG tablet Commonly known as:  LIPITOR Take 1 tablet (80 mg total) by mouth daily at 6 PM.   benazepril 40 MG tablet Commonly known as:  LOTENSIN Take 1 tablet (40 mg total) by mouth daily.   ergocalciferol 1.25 MG (50000 UT) capsule Commonly known as:  VITAMIN D2 Take 50,000 Units by mouth once a week.   furosemide 40 MG tablet Commonly known as:  LASIX Take 1 tablet (40 mg total) by mouth daily. What changed:  when to take this   gabapentin  300 MG capsule Commonly known as:  NEURONTIN Take 2 capsules (600 mg total) by mouth at bedtime.   gabapentin 600 MG tablet Commonly known as:  NEURONTIN Take 600 mg by mouth at bedtime as needed (nerve pain).   hydroxyurea 500 MG capsule Commonly known as:  HYDREA Take 2 capsules (1,000 mg total) by mouth daily. May take with food to minimize GI side effects.   insulin aspart 100 UNIT/ML injection Commonly known as:  NOVOLOG Inject 0-15 Units into the skin 3 (three) times daily with meals. Per sliding scale in separate instructions.   insulin NPH-regular Human (70-30) 100 UNIT/ML injection Inject 44 Units into the skin 2 (two) times daily with a meal.   metFORMIN 1000 MG tablet Commonly known as:  GLUCOPHAGE Take 500 mg by mouth 2 (two) times daily.   methocarbamol 500 MG tablet Commonly known as:  ROBAXIN Take 1 tablet (500 mg total) by mouth every 6 (six) hours as needed for muscle spasms.   multivitamin with minerals Tabs tablet Take 1 tablet by mouth daily.   omeprazole 20 MG capsule Commonly known as:  PRILOSEC Take 20 mg by mouth daily.   oxyCODONE 5 MG immediate release tablet Commonly known as:  Oxy IR/ROXICODONE Take 1-2 tablets (5-10 mg total) by mouth every 6 (six) hours as needed for moderate pain (pain score 4-6).   traMADol 50 MG tablet Commonly known as:  ULTRAM Take 1-2 tablets (50-100 mg total) by mouth every 6 (six) hours as needed for moderate pain (unresponsive to oxycodone).   venlafaxine XR 75 MG 24 hr capsule Commonly known as:  EFFEXOR-XR Take 75 mg by mouth daily.      Follow-up Information    [facility] Follow up.   Specialty:  Home Health Services Why:  Physical therapist will contact you to arrange appointments.  Call orthopedic office for issues related to home health services.  Contact information: [name] [place] [address] [phone]          No Known  Allergies  Consultations:  Neurology   Procedures/Studies: Dg Chest 2 View  Result Date: 10/13/2018 CLINICAL DATA:  Confusion, hyperglycemia, congestion, cough, elevated heart rate. 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 Chest 05/25/2011 FINDINGS: Mild cardiac enlargement. No vascular congestion, consolidation, or edema. No blunting of costophrenic angles. No pneumothorax. Mediastinal contours appear intact. Degenerative changes in the spine. IMPRESSION: Mild cardiac enlargement. No evidence of active pulmonary disease. Electronically Signed   By: Lucienne Capers M.D.   On: 10/13/2018 20:00   [name] Contrast  Result Date: 10/14/2018 CLINICAL DATA:  Confusion, slurred speech after knee placement October 08, 2018. history of hypertension, hyperlipidemia, diabetes and stroke. EXAM: MRI HEAD WITHOUT CONTRAST TECHNIQUE: Multiplanar, multiecho pulse sequences of the brain and surrounding structures were obtained without intravenous contrast. COMPARISON:  MRI head August 10, 2017 FINDINGS: INTRACRANIAL CONTENTS: No reduced diffusion to suggest acute ischemia. Moderate to severe parenchymal brain volume loss. Old LEFT basal ganglia small infarcts. No hydrocephalus. Faint susceptibility artifact associated with LEFT frontal encephalomalacia. Patchy pontine and confluent supratentorial white matter FLAIR T2 hyperintensities. Old small RIGHT cerebellar infarct. Old small RIGHT thalamus lacunar infarct. Prominent basal ganglia and thalami perivascular spaces associated with chronic small vessel ischemic changes. No suspicious parenchymal signal, masses, mass effect. No abnormal extra-axial fluid collections. No extra-axial masses. VASCULAR: Normal major intracranial vascular flow voids present at skull base. SKULL AND UPPER CERVICAL SPINE: No abnormal sellar expansion. No suspicious calvarial bone marrow signal. Craniocervical junction maintained.  SINUSES/ORBITS: Trace RIGHT mastoid effusion. Mild maxillary sinus mucosal thickening without paranasal sinus air-fluid levels.The included ocular globes and orbital contents are non-suspicious. OTHER: None. IMPRESSION: 1. No acute intracranial process. 2. 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 3. Moderate chronic small vessel ischemic changes. 4. Moderate to severe parenchymal brain volume loss. Electronically Signed   By: Elon Alas M.D.   On: 10/14/2018 02:45   Vas [place] Lower Extremity Venous (dvt) (only [facility])  Result Date: 10/14/2018  Lower Venous Study Indications: Knee Pain, and Recent total knee replacement.  Limitations: Edema. Comparison Study: No prior study on file Performing Technologist: Sharion Dove RVS  Examination Guidelines: A complete evaluation includes B-mode imaging, spectral Doppler, color Doppler, and power Doppler as needed of all accessible portions of each vessel. Bilateral testing is considered an integral part of a complete examination. Limited examinations for reoccurring indications may be performed as noted.  Right Venous Findings: +---------+---------------+---------+-----------+----------+--------------+          CompressibilityPhasicitySpontaneityPropertiesSummary        +---------+---------------+---------+-----------+----------+--------------+ CFV      Full  Yes      Yes                                 +---------+---------------+---------+-----------+----------+--------------+ SFJ      Full                                                        +---------+---------------+---------+-----------+----------+--------------+ FV Prox  Full                                                        +---------+---------------+---------+-----------+----------+--------------+ FV Mid   Full                                                         +---------+---------------+---------+-----------+----------+--------------+ FV DistalFull                                                        +---------+---------------+---------+-----------+----------+--------------+ PFV                                                   Not visualized +---------+---------------+---------+-----------+----------+--------------+ POP      Full           Yes      Yes                                 +---------+---------------+---------+-----------+----------+--------------+ PTV      Full                                                        +---------+---------------+---------+-----------+----------+--------------+ PERO     Full                                                        +---------+---------------+---------+-----------+----------+--------------+  Left Venous Findings: +---+---------------+---------+-----------+----------+-------+    CompressibilityPhasicitySpontaneityPropertiesSummary +---+---------------+---------+-----------+----------+-------+ CFVFull           Yes      Yes                          +---+---------------+---------+-----------+----------+-------+    Summary: Right: There is no evidence of deep vein thrombosis in the lower extremity. However, portions of this examination were limited- see technologist comments  above. Left: No evidence of common femoral vein obstruction.  *See table(s) above for measurements and observations. Electronically signed by Curt Jews MD on 10/14/2018 at 11:28:55 AM.    Final       Subjective: Feeling well.  Oriented to person, place, time and situation.  No headache, chest pain.  No fever, cough, dysuria.  No falls.  A little spacy from oxycodone 1 hour ago, but otherwise no complaints.  Stil lconstipated.  Discharge Exam: Vitals:   10/14/18 0806 10/14/18 1149  BP: (!) 155/79 121/75  Pulse: 86 64  Resp:  16  Temp: 98.4 F (36.9 C) 98.1 F (36.7 C)  SpO2:  95%    Vitals:   10/14/18 0500 10/14/18 0600 10/14/18 0806 10/14/18 1149  BP: 124/68 (!) 157/72 (!) 155/79 121/75  Pulse: (!) 122 82 86 64  Resp: 19 14  16   Temp:   98.4 F (36.9 C) 98.1 F (36.7 C)  TempSrc:   Oral Oral  SpO2: 94% 95%  95%  Weight:      Height:        General: Pt is alert, awake, not in acute distress, sitting in recliner Cardiovascular: RRR, nl S1-S2, no murmurs appreciated.   No LE edema.   Respiratory: Normal respiratory rate and rhythm.  CTAB without rales or wheezes. Abdominal: Abdomen soft and non-tender.  No distension or HSM.   Neuro/Psych: Strength symmetric in upper and lower extremities.  Judgment and insight appear normal.   The results of significant diagnostics from this hospitalization (including imaging, microbiology, ancillary and laboratory) are listed below for reference.     Microbiology: No results found for this or any previous visit (from the past 240 hour(s)).   Labs: BNP (last 3 results) Recent Labs    10/13/18 1826  BNP 58.5   Basic Metabolic Panel: Recent Labs  Lab 10/09/18 0612 10/10/18 0547 10/13/18 1824  NA 133* 134* 132*  K 4.5 4.1 3.9  CL 99 98 97*  CO2 26 30 26   GLUCOSE 365* 324* 352*  BUN 22 24* 18  CREATININE 0.93 0.98 0.72  CALCIUM 8.6* 8.7* 8.4*  MG  --   --  1.9   Liver Function Tests: Recent Labs  Lab 10/13/18 1824 10/14/18 0610  AST 24  --   ALT 24  --   ALKPHOS 70  --   BILITOT 1.7* 1.3*  PROT 6.4*  --   ALBUMIN 3.3*  --    No results for input(s): LIPASE, AMYLASE in the last 168 hours. No results for input(s): AMMONIA in the last 168 hours. CBC: Recent Labs  Lab 10/09/18 0612 10/10/18 0547 10/11/18 0507 10/13/18 1824  WBC 9.8 14.8* 13.4* 13.3*  NEUTROABS  --   --   --  10.5*  HGB 10.6* 10.3* 10.7* 11.0*  HCT 32.5* 31.0* 32.7* 33.4*  MCV 104.2* 104.4* 104.8* 104.0*  PLT 605* 485* 493* 561*   Cardiac Enzymes: No results for input(s): CKTOTAL, CKMB, CKMBINDEX, TROPONINI in the last 168  hours. BNP: Invalid input(s): POCBNP CBG: Recent Labs  Lab 10/13/18 1830 10/14/18 0117 10/14/18 0610 10/14/18 0754 10/14/18 1118  GLUCAP 327* 267* 301* 323* 410*   D-Dimer No results for input(s): DDIMER in the last 72 hours. Hgb A1c No results for input(s): HGBA1C in the last 72 hours. Lipid Profile No results for input(s): CHOL, HDL, LDLCALC, TRIG, CHOLHDL, LDLDIRECT in the last 72 hours. Thyroid function studies Recent Labs    10/13/18 1825  TSH 2.881  Anemia work up No results for input(s): VITAMINB12, FOLATE, FERRITIN, TIBC, IRON, RETICCTPCT in the last 72 hours. Urinalysis    Component Value Date/Time   COLORURINE YELLOW 10/13/2018 2034   APPEARANCEUR CLEAR 10/13/2018 2034   LABSPEC 1.012 10/13/2018 2034   PHURINE 7.0 10/13/2018 2034   GLUCOSEU >=500 (A) 10/13/2018 2034   HGBUR NEGATIVE 10/13/2018 2034   [place] NEGATIVE 10/13/2018 2034   [place] NEGATIVE 10/13/2018 2034   PROTEINUR NEGATIVE 10/13/2018 2034   NITRITE NEGATIVE 10/13/2018 2034   LEUKOCYTESUR NEGATIVE 10/13/2018 2034   Sepsis Labs Invalid input(s): PROCALCITONIN,  WBC,  LACTICIDVEN Microbiology No results found for this or any previous visit (from the past 240 hour(s)).   Time coordinating discharge: 40 minutes       SIGNED:   Edwin Dada, MD  [facility] 10/14/2018, 1:39 PM

## 2018-10-14 NOTE — Progress Notes (Signed)
Md notified pt MR Brain scan complete. Pt wanting something to eat.  CBG earlier = 267.  Clarificaton if want Labs this am and  If want to order CPAP  for HS. Wife at bedside. Will continue to monitor. Saunders Revel T

## 2018-10-15 DIAGNOSIS — M1711 Unilateral primary osteoarthritis, right knee: Secondary | ICD-10-CM | POA: Diagnosis not present

## 2018-10-15 DIAGNOSIS — M6281 Muscle weakness (generalized): Secondary | ICD-10-CM | POA: Diagnosis not present

## 2018-10-15 DIAGNOSIS — E1140 Type 2 diabetes mellitus with diabetic neuropathy, unspecified: Secondary | ICD-10-CM | POA: Diagnosis not present

## 2018-10-15 DIAGNOSIS — R269 Unspecified abnormalities of gait and mobility: Secondary | ICD-10-CM | POA: Diagnosis not present

## 2018-10-15 LAB — URINE CULTURE: Culture: <10000 — AB

## 2018-10-15 NOTE — Discharge Summary (Signed)
Physician Discharge Summary   [name] MRN: [id number] DOB/AGE: [birth year] 79 y.o.  Admit date: 10/08/2018 Discharge date: 10/11/2018  Primary Diagnosis: Osteoarthritis, right knee   Admission Diagnoses:  Past Medical History:  Diagnosis Date  . Anxiety   . Arthritis   . Cancer (HCC)    skin - basil cell  . Depression   . Diabetes mellitus without complication ([place])   . Dysrhythmia    a-fib  . GERD (gastroesophageal reflux disease)   . Hyperlipidemia   . Hypertension   . Neuropathy   . Obesity   . Paroxysmal atrial fibrillation (HCC)   . Peripheral vascular disease ([place])    diabetic neuropathy in both feet  . Sleep apnea    uses C-pap machine  . Stroke [facility])    08/09/2017   Discharge Diagnoses:   Principal Problem:   OA (osteoarthritis) of knee  Estimated body mass index is 37.97 kg/m as calculated from the following:   Height as of this encounter: 6' (1.829 m).   Weight as of this encounter: 127 kg.  Procedure:  Procedure(s) (LRB): RIGHT TOTAL KNEE ARTHROPLASTY (Right)   Consults: None  HPI: [name] is a 78 y.o. year old male with end stage OA of his right knee with progressively worsening pain and dysfunction. He has constant pain, with activity and at rest and significant functional deficits with difficulties even with ADLs. He has had extensive non-op management including analgesics, injections of cortisone and viscosupplements, and home exercise program, but remains in significant pain with significant dysfunction. Radiographs show bone on bone arthritis medial and patellofemoral. He presents now for right Total Knee Arthroplasty.    Laboratory Data: Admission on 10/08/2018, Discharged on 10/11/2018  Component Date Value Ref Range Status  . Glucose-Capillary 10/08/2018 179* 70 - 99 mg/dL Final  . Comment 1 10/08/2018 Notify RN   Final  . Comment 2 10/08/2018 Document in Chart   Final  . Glucose-Capillary 10/08/2018 178* 70 - 99  mg/dL Final  . Glucose-Capillary 10/08/2018 280* 70 - 99 mg/dL Final  . WBC 10/09/2018 9.8  4.0 - 10.5 K/uL Final  . RBC 10/09/2018 3.12* 4.22 - 5.81 MIL/uL Final  . Hemoglobin 10/09/2018 10.6* 13.0 - 17.0 g/dL Final  . HCT 10/09/2018 32.5* 39.0 - 52.0 % Final  . MCV 10/09/2018 104.2* 80.0 - 100.0 fL Final  . MCH 10/09/2018 34.0  26.0 - 34.0 pg Final  . MCHC 10/09/2018 32.6  30.0 - 36.0 g/dL Final  . RDW 10/09/2018 16.2* 11.5 - 15.5 % Final  . Platelets 10/09/2018 605* 150 - 400 K/uL Final  . nRBC 10/09/2018 0.3* 0.0 - 0.2 % Final   Performed at [facility], [place] [address], [place], [place] [address]  . Sodium 10/09/2018 133* 135 - 145 mmol/L Final  . Potassium 10/09/2018 4.5  3.5 - 5.1 mmol/L Final  . Chloride 10/09/2018 99  98 - 111 mmol/L Final  . CO2 10/09/2018 26  22 - 32 mmol/L Final  . Glucose, Bld 10/09/2018 365* 70 - 99 mg/dL Final  . BUN 10/09/2018 22  8 - 23 mg/dL Final  . Creatinine, Ser 10/09/2018 0.93  0.61 - 1.24 mg/dL Final  . Calcium 10/09/2018 8.6* 8.9 - 10.3 mg/dL Final  . GFR calc non Af Amer 10/09/2018 >60  >60 mL/min Final  . GFR calc Af Amer 10/09/2018 >60  >60 mL/min Final  . Anion gap 10/09/2018 8  5 - 15 Final   Performed  at [facility], [place] [address], [place], [place] [address]  . Glucose-Capillary 10/08/2018 340* 70 - 99 mg/dL Final  . Comment 1 10/08/2018 Notify RN   Final  . Glucose-Capillary 10/09/2018 326* 70 - 99 mg/dL Final  . Glucose-Capillary 10/09/2018 321* 70 - 99 mg/dL Final  . Glucose-Capillary 10/09/2018 300* 70 - 99 mg/dL Final  . WBC 10/10/2018 14.8* 4.0 - 10.5 K/uL Final  . RBC 10/10/2018 2.97* 4.22 - 5.81 MIL/uL Final  . Hemoglobin 10/10/2018 10.3* 13.0 - 17.0 g/dL Final  . HCT 10/10/2018 31.0* 39.0 - 52.0 % Final  . MCV 10/10/2018 104.4* 80.0 - 100.0 fL Final  . MCH 10/10/2018 34.7* 26.0 - 34.0 pg Final  . MCHC 10/10/2018 33.2  30.0 - 36.0 g/dL Final  . RDW 10/10/2018 16.4* 11.5 - 15.5 % Final    . Platelets 10/10/2018 485* 150 - 400 K/uL Final  . nRBC 10/10/2018 1.0* 0.0 - 0.2 % Final   Performed at [facility], [place] [address], [place], [place] [address]  . Sodium 10/10/2018 134* 135 - 145 mmol/L Final  . Potassium 10/10/2018 4.1  3.5 - 5.1 mmol/L Final  . Chloride 10/10/2018 98  98 - 111 mmol/L Final  . CO2 10/10/2018 30  22 - 32 mmol/L Final  . Glucose, Bld 10/10/2018 324* 70 - 99 mg/dL Final  . BUN 10/10/2018 24* 8 - 23 mg/dL Final  . Creatinine, Ser 10/10/2018 0.98  0.61 - 1.24 mg/dL Final  . Calcium 10/10/2018 8.7* 8.9 - 10.3 mg/dL Final  . GFR calc non Af Amer 10/10/2018 >60  >60 mL/min Final  . GFR calc Af Amer 10/10/2018 >60  >60 mL/min Final  . Anion gap 10/10/2018 6  5 - 15 Final   Performed at [facility], [place] [address], [place], [place] [address]  . Glucose-Capillary 10/09/2018 321* 70 - 99 mg/dL Final  . Glucose-Capillary 10/10/2018 298* 70 - 99 mg/dL Final  . Glucose-Capillary 10/10/2018 385* 70 - 99 mg/dL Final  . Glucose-Capillary 10/10/2018 380* 70 - 99 mg/dL Final  . WBC 10/11/2018 13.4* 4.0 - 10.5 K/uL Final  . RBC 10/11/2018 3.12* 4.22 - 5.81 MIL/uL Final  . Hemoglobin 10/11/2018 10.7* 13.0 - 17.0 g/dL Final  . HCT 10/11/2018 32.7* 39.0 - 52.0 % Final  . MCV 10/11/2018 104.8* 80.0 - 100.0 fL Final  . MCH 10/11/2018 34.3* 26.0 - 34.0 pg Final  . MCHC 10/11/2018 32.7  30.0 - 36.0 g/dL Final  . RDW 10/11/2018 16.5* 11.5 - 15.5 % Final  . Platelets 10/11/2018 493* 150 - 400 K/uL Final  . nRBC 10/11/2018 0.5* 0.0 - 0.2 % Final   Performed at [facility], [place] [address], [place], [place] [address]  . Glucose-Capillary 10/10/2018 161* 70 - 99 mg/dL Final  . Glucose-Capillary 10/11/2018 215* 70 - 99 mg/dL Final  . Glucose-Capillary 10/11/2018 243* 70 - 99 mg/dL Final  Hospital Outpatient Visit on 10/03/2018  Component Date Value Ref Range Status  . aPTT 10/03/2018 38* 24 - 36 seconds Final    Comment:        IF BASELINE aPTT IS ELEVATED, SUGGEST PATIENT RISK ASSESSMENT BE USED TO DETERMINE APPROPRIATE ANTICOAGULANT THERAPY. Performed at [facility], [place] [address], [place], [place] [address]   . WBC 10/03/2018 7.8  4.0 - 10.5 K/uL Final  . RBC 10/03/2018 3.97* 4.22 - 5.81 MIL/uL Final  . Hemoglobin 10/03/2018 13.4  13.0 - 17.0 g/dL Final  .  HCT 10/03/2018 40.9  39.0 - 52.0 % Final  . MCV 10/03/2018 103.0* 80.0 - 100.0 fL Final  . MCH 10/03/2018 33.8  26.0 - 34.0 pg Final  . MCHC 10/03/2018 32.8  30.0 - 36.0 g/dL Final  . RDW 10/03/2018 16.0* 11.5 - 15.5 % Final  . Platelets 10/03/2018 690* 150 - 400 K/uL Final  . nRBC 10/03/2018 0.5* 0.0 - 0.2 % Final   Performed at [facility], [place] [address] [place], [place] [address]  . Sodium 10/03/2018 138  135 - 145 mmol/L Final  . Potassium 10/03/2018 4.5  3.5 - 5.1 mmol/L Final  . Chloride 10/03/2018 104  98 - 111 mmol/L Final  . CO2 10/03/2018 26  22 - 32 mmol/L Final  . Glucose, Bld 10/03/2018 299* 70 - 99 mg/dL Final  . BUN 10/03/2018 19  8 - 23 mg/dL Final  . Creatinine, Ser 10/03/2018 0.79  0.61 - 1.24 mg/dL Final  . Calcium 10/03/2018 8.9  8.9 - 10.3 mg/dL Final  . Total Protein 10/03/2018 6.3* 6.5 - 8.1 g/dL Final  . Albumin 10/03/2018 3.6  3.5 - 5.0 g/dL Final  . AST 10/03/2018 25  15 - 41 U/L Final  . ALT 10/03/2018 32  0 - 44 U/L Final  . Alkaline Phosphatase 10/03/2018 82  38 - 126 U/L Final  . Total Bilirubin 10/03/2018 1.0  0.3 - 1.2 mg/dL Final  . GFR calc non Af Amer 10/03/2018 >60  >60 mL/min Final  . GFR calc Af Amer 10/03/2018 >60  >60 mL/min Final  . Anion gap 10/03/2018 8  5 - 15 Final   Performed at [facility], [place] [address], [place], [place] [address]  . Prothrombin Time 10/03/2018 15.4* 11.4 - 15.2 seconds Final  . INR 10/03/2018 1.23   Final   Performed at [facility], [place] [address], [place], [place] [address]  .  ABO/RH(D) 10/03/2018 O POS   Final  . Antibody Screen 10/03/2018 NEG   Final  . Sample Expiration 10/03/2018 10/11/2018   Final  . Extend sample reason 10/03/2018    Final                   Value:NO TRANSFUSIONS OR PREGNANCY IN THE PAST 3 MONTHS Performed at [facility], [place] [address] [place], [place] [address]   . MRSA, PCR 10/03/2018 NEGATIVE  NEGATIVE Final  . Staphylococcus aureus 10/03/2018 POSITIVE* NEGATIVE Final   Comment: (NOTE) The Xpert SA Assay (FDA approved for NASAL specimens in patients 20 years of age and older), is one component of a comprehensive surveillance program. It is not intended to diagnose infection nor to guide or monitor treatment. Performed at [facility], [place] [address], [place], [place] [address]   . Glucose-Capillary 10/03/2018 273* 70 - 99 mg/dL Final  Appointment on 09/26/2018  Component Date Value Ref Range Status  . WBC 09/26/2018 6.4  4.0 - 10.5 K/uL Final  . RBC 09/26/2018 3.82* 4.22 - 5.81 MIL/uL Final  . Hemoglobin 09/26/2018 13.0  13.0 - 17.0 g/dL Final  . HCT 09/26/2018 38.6* 39.0 - 52.0 % Final  . MCV 09/26/2018 101.0* 80.0 - 100.0 fL Final  . MCH 09/26/2018 34.0  26.0 - 34.0 pg Final  . MCHC 09/26/2018 33.7  30.0 - 36.0 g/dL Final  . RDW 09/26/2018 15.9* 11.5 - 15.5 % Final  . Platelets 09/26/2018 608* 150 - 400 K/uL Final  . nRBC 09/26/2018 0.0  0.0 - 0.2 % Final  . Neutrophils Relative % 09/26/2018 67  % Final  . Neutro Abs 09/26/2018 4.3  1.7 - 7.7 K/uL Final  . Lymphocytes Relative 09/26/2018 20  % Final  . Lymphs Abs 09/26/2018 1.3  0.7 - 4.0 K/uL Final  . Monocytes Relative 09/26/2018 8  % Final  . Monocytes Absolute 09/26/2018 0.5  0.1 - 1.0 K/uL Final  . Eosinophils Relative 09/26/2018 2  % Final  . Eosinophils Absolute 09/26/2018 0.1  0.0 - 0.5 K/uL Final  . Basophils Relative 09/26/2018 2  % Final  . Basophils Absolute 09/26/2018 0.1  0.0 - 0.1 K/uL Final  . Immature Granulocytes  09/26/2018 1  % Final  . Abs Immature Granulocytes 09/26/2018 0.03  0.00 - 0.07 K/uL Final   Performed at [facility], [place] [address], [place], [place] [address]  . Sodium 09/26/2018 138  135 - 145 mmol/L Final  . Potassium 09/26/2018 4.5  3.5 - 5.1 mmol/L Final  . Chloride 09/26/2018 105  98 - 111 mmol/L Final  . CO2 09/26/2018 28  22 - 32 mmol/L Final  . Glucose, Bld 09/26/2018 272* 70 - 99 mg/dL Final  . BUN 09/26/2018 14  8 - 23 mg/dL Final  . Creatinine 09/26/2018 0.82  0.61 - 1.24 mg/dL Final  . Calcium 09/26/2018 8.9  8.9 - 10.3 mg/dL Final  . Total Protein 09/26/2018 6.6  6.5 - 8.1 g/dL Final  . Albumin 09/26/2018 3.5  3.5 - 5.0 g/dL Final  . AST 09/26/2018 19  15 - 41 U/L Final  . ALT 09/26/2018 29  0 - 44 U/L Final  . Alkaline Phosphatase 09/26/2018 105  38 - 126 U/L Final  . Total Bilirubin 09/26/2018 1.0  0.3 - 1.2 mg/dL Final  . GFR, Est Non Af Am 09/26/2018 >60  >60 mL/min Final  . GFR, Est AFR Am 09/26/2018 >60  >60 mL/min Final  . Anion gap 09/26/2018 5  5 - 15 Final   Performed at [facility], [place] [address], [place], [place] [address]  Appointment on 08/22/2018  Component Date Value Ref Range Status  . Rest HR 08/23/2018 61  bpm Final  . Rest BP 08/23/2018 158/88  mmHg Final  . Peak HR 08/23/2018 75  bpm Final  . Peak BP 08/23/2018 153/85  mmHg Final  . SSS 08/23/2018 0   Final  . SRS 08/23/2018 0   Final  . SDS 08/23/2018 0   Final  . TID 08/23/2018 0.95   Final  . LV sys vol 08/23/2018 54  mL Final  . LV dias vol 08/23/2018 124  62 - 150 mL Final     EKG: Orders placed or performed in visit on 07/30/18  . EKG 12-Lead     Hospital Course: [name] is a 78 y.o. who was admitted to [facility]. They were brought to the operating room on 10/08/2018 and underwent Procedure(s): RIGHT TOTAL KNEE ARTHROPLASTY.  Patient tolerated the procedure well and was later transferred to the recovery room and  then to the orthopaedic floor for postoperative care. They were given PO and IV analgesics for pain control following their surgery. They were given 24 hours of postoperative antibiotics of  Anti-infectives (From admission, onward)   Start     Dose/Rate Route Frequency Ordered Stop   10/08/18 1600  ceFAZolin (ANCEF) IVPB 2g/100 mL premix     2 g 200 mL/hr over 30 Minutes Intravenous  Every 6 hours 10/08/18 1245 10/08/18 2147   10/08/18 0600  ceFAZolin (ANCEF) 3 g in dextrose 5 % 50 mL IVPB     3 g 100 mL/hr over 30 Minutes Intravenous On call to O.R. 10/07/18 [id number] 10/08/18 0959     and started on DVT prophylaxis in the form of Eliquis.   PT and OT were ordered for total joint protocol. Discharge planning consulted to help with postop disposition and equipment needs. Patient had a decent night on the evening of surgery. They started to get up OOB with therapy on POD #0. Hemovac drain was pulled without difficulty on day one. Continued to work with therapy into POD #2. Dressing was changed and the incision was clean, dry, and intact with no drainage. Pt was slower to progress with therapy and required an additional day to continue working towards his goals. He was seen on POD #3 during rounds and was ready to go home pending progress with therapy. Incision was healing well.  Pt worked with therapy for two additional sessions and was meeting their goals. He was discharged to home later that day in stable condition.  Diet: Diabetic diet Activity: WBAT Follow-up: in 2 weeks Disposition: Home with HHPT Discharged Condition: stable   Discharge Instructions    Call MD / Call 911   Complete by:  As directed    If you experience chest pain or shortness of breath, CALL 911 and be transported to the hospital emergency room.  If you develope a fever above 101 F, pus (white drainage) or increased drainage or redness at the wound, or calf pain, call your surgeon's office.   Change dressing   Complete by:  As  directed    Change the dressing daily with sterile 4 x 4 inch gauze dressing and apply TED hose.   Constipation Prevention   Complete by:  As directed    Drink plenty of fluids.  Prune juice may be helpful.  You may use a stool softener, such as Colace (over the counter) 100 mg twice a day.  Use MiraLax (over the counter) for constipation as needed.   Diet - low sodium heart healthy   Complete by:  As directed    Discharge instructions   Complete by:  As directed    Dr. [name] Total Joint Specialist [facility] [address], [place], [place] [address] [phone]  TOTAL KNEE REPLACEMENT POSTOPERATIVE DIRECTIONS  Knee Rehabilitation, Guidelines Following Surgery  Results after knee surgery are often greatly improved when you follow the exercise, range of motion and muscle strengthening exercises prescribed by your doctor. Safety measures are also important to protect the knee from further injury. Any time any of these exercises cause you to have increased pain or swelling in your knee joint, decrease the amount until you are comfortable again and slowly increase them. If you have problems or questions, call your caregiver or physical therapist for advice.   HOME CARE INSTRUCTIONS  Remove items at home which could result in a fall. This includes throw rugs or furniture in walking pathways.  ICE to the affected knee every three hours for 30 minutes at a time and then as needed for pain and swelling.  Continue to use ice on the knee for pain and swelling from surgery. You may notice swelling that will progress down to the foot and ankle.  This is normal after surgery.  Elevate the leg when you are not up walking on it.  Continue to use the breathing machine which will help keep your temperature down.  It is common for your temperature to cycle up and down following surgery, especially at night when you are not up moving around and exerting yourself.  The breathing machine  keeps your lungs expanded and your temperature down. Do not place pillow under knee, focus on keeping the knee straight while resting   DIET You may resume your previous home diet once your are discharged from the hospital.  DRESSING / WOUND CARE / SHOWERING You may shower 3 days after surgery, but keep the wounds dry during showering.  You may use an occlusive plastic wrap (Press'n Seal for example), NO SOAKING/SUBMERGING IN THE BATHTUB.  If the bandage gets wet, change with a clean dry gauze.  If the incision gets wet, pat the wound dry with a clean towel. You may start showering once you are discharged home but do not submerge the incision under water. Just pat the incision dry and apply a dry gauze dressing on daily. Change the surgical dressing daily and reapply a dry dressing each time.  ACTIVITY Walk with your walker as instructed. Use walker as long as suggested by your caregivers. Avoid periods of inactivity such as sitting longer than an hour when not asleep. This helps prevent blood clots.  You may resume a sexual relationship in one month or when given the OK by your doctor.  You may return to work once you are cleared by your doctor.  Do not drive a car for 6 weeks or until released by you surgeon.  Do not drive while taking narcotics.  WEIGHT BEARING Weight bearing as tolerated with assist device (walker, cane, etc) as directed, use it as long as suggested by your surgeon or therapist, typically at least 4-6 weeks.  POSTOPERATIVE CONSTIPATION PROTOCOL Constipation - defined medically as fewer than three stools per week and severe constipation as less than one stool per week.  One of the most common issues patients have following surgery is constipation.  Even if you have a regular bowel pattern at home, your normal regimen is likely to be disrupted due to multiple reasons following surgery.  Combination of anesthesia, postoperative narcotics, change in appetite and fluid  intake all can affect your bowels.  In order to avoid complications following surgery, here are some recommendations in order to help you during your recovery period.  Colace (docusate) - Pick up an over-the-counter form of Colace or another stool softener and take twice a day as long as you are requiring postoperative pain medications.  Take with a full glass of water daily.  If you experience loose stools or diarrhea, hold the colace until you stool forms back up.  If your symptoms do not get better within 1 week or if they get worse, check with your doctor.  Dulcolax (bisacodyl) - Pick up over-the-counter and take as directed by the product packaging as needed to assist with the movement of your bowels.  Take with a full glass of water.  Use this product as needed if not relieved by Colace only.   MiraLax (polyethylene glycol) - Pick up over-the-counter to have on hand.  MiraLax is a solution that will increase the amount of water in your bowels to assist with bowel movements.  Take as directed and can mix with a glass of water, juice, soda, coffee, or tea.  Take if you go more than two days without a movement. Do not use MiraLax more than  once per day. Call your doctor if you are still constipated or irregular after using this medication for 7 days in a row.  If you continue to have problems with postoperative constipation, please contact the office for further assistance and recommendations.  If you experience "the worst abdominal pain ever" or develop nausea or vomiting, please contact the office immediatly for further recommendations for treatment.  ITCHING  If you experience itching with your medications, try taking only a single pain pill, or even half a pain pill at a time.  You can also use Benadryl over the counter for itching or also to help with sleep.   TED HOSE STOCKINGS Wear the elastic stockings on both legs for three weeks following surgery during the day but you may remove then at  night for sleeping.  MEDICATIONS See your medication summary on the "After Visit Summary" that the nursing staff will review with you prior to discharge.  You may have some home medications which will be placed on hold until you complete the course of blood thinner medication.  It is important for you to complete the blood thinner medication as prescribed by your surgeon.  Continue your approved medications as instructed at time of discharge.  PRECAUTIONS If you experience chest pain or shortness of breath - call 911 immediately for transfer to the hospital emergency department.  If you develop a fever greater that 101 F, purulent drainage from wound, increased redness or drainage from wound, foul odor from the wound/dressing, or calf pain - CONTACT YOUR SURGEON.                                                   FOLLOW-UP APPOINTMENTS Make sure you keep all of your appointments after your operation with your surgeon and caregivers. You should call the office at the above phone number and make an appointment for approximately two weeks after the date of your surgery or on the date instructed by your surgeon outlined in the "After Visit Summary".   RANGE OF MOTION AND STRENGTHENING EXERCISES  Rehabilitation of the knee is important following a knee injury or an operation. After just a few days of immobilization, the muscles of the thigh which control the knee become weakened and shrink (atrophy). Knee exercises are designed to build up the tone and strength of the thigh muscles and to improve knee motion. Often times heat used for twenty to thirty minutes before working out will loosen up your tissues and help with improving the range of motion but do not use heat for the first two weeks following surgery. These exercises can be done on a training (exercise) mat, on the floor, on a table or on a bed. Use what ever works the best and is most comfortable for you Knee exercises include:  Leg Lifts - While  your knee is still immobilized in a splint or cast, you can do straight leg raises. Lift the leg to 60 degrees, hold for 3 sec, and slowly lower the leg. Repeat 10-20 times 2-3 times daily. Perform this exercise against resistance later as your knee gets better.  Quad and Hamstring Sets - Tighten up the muscle on the front of the thigh (Quad) and hold for 5-10 sec. Repeat this 10-20 times hourly. Hamstring sets are done by pushing the foot backward against an object and  holding for 5-10 sec. Repeat as with quad sets.  Leg Slides: Lying on your back, slowly slide your foot toward your buttocks, bending your knee up off the floor (only go as far as is comfortable). Then slowly slide your foot back down until your leg is flat on the floor again. Angel Wings: Lying on your back spread your legs to the side as far apart as you can without causing discomfort.  A rehabilitation program following serious knee injuries can speed recovery and prevent re-injury in the future due to weakened muscles. Contact your doctor or a physical therapist for more information on knee rehabilitation.   IF YOU ARE TRANSFERRED TO A SKILLED REHAB FACILITY If the patient is transferred to a skilled rehab facility following release from the hospital, a list of the current medications will be sent to the facility for the patient to continue.  When discharged from the skilled rehab facility, please have the facility set up the patient's [place] prior to being released. Also, the skilled facility will be responsible for providing the patient with their medications at time of release from the facility to include their pain medication, the muscle relaxants, and their blood thinner medication. If the patient is still at the rehab facility at time of the two week follow up appointment, the skilled rehab facility will also need to assist the patient in arranging follow up appointment in our office and any transportation  needs.  MAKE SURE YOU:  Understand these instructions.  Get help right away if you are not doing well or get worse.    Pick up stool softner and laxative for home use following surgery while on pain medications. Do not submerge incision under water. Please use good hand washing techniques while changing dressing each day. May shower starting three days after surgery. Please use a clean towel to pat the incision dry following showers. Continue to use ice for pain and swelling after surgery. Do not use any lotions or creams on the incision until instructed by your surgeon.   Do not put a pillow under the knee. Place it under the heel.   Complete by:  As directed    Driving restrictions   Complete by:  As directed    No driving for two weeks   TED hose   Complete by:  As directed    Use stockings (TED hose) for three weeks on both leg(s).  You may remove them at night for sleeping.   Weight bearing as tolerated   Complete by:  As directed      Allergies as of 10/11/2018   No Known Allergies     Medication List    TAKE these medications   ALPRAZolam 0.5 MG tablet Commonly known as:  XANAX Take 0.5 mg by mouth 2 (two) times daily.   apixaban 5 MG Tabs tablet Commonly known as:  ELIQUIS Take 1 tablet (5 mg total) by mouth 2 (two) times daily.   atorvastatin 80 MG tablet Commonly known as:  LIPITOR Take 1 tablet (80 mg total) by mouth daily at 6 PM.   benazepril 40 MG tablet Commonly known as:  LOTENSIN Take 1 tablet (40 mg total) by mouth daily.   ergocalciferol 1.25 MG (50000 UT) capsule Commonly known as:  VITAMIN D2 Take 50,000 Units by mouth once a week.   furosemide 40 MG tablet Commonly known as:  LASIX Take 1 tablet (40 mg total) by mouth daily. What changed:  when to  take this   gabapentin 300 MG capsule Commonly known as:  NEURONTIN Take 2 capsules (600 mg total) by mouth at bedtime.   gabapentin 600 MG tablet Commonly known as:  NEURONTIN Take 600 mg  by mouth at bedtime as needed (nerve pain).   hydroxyurea 500 MG capsule Commonly known as:  HYDREA Take 2 capsules (1,000 mg total) by mouth daily. May take with food to minimize GI side effects.   insulin NPH-regular Human (70-30) 100 UNIT/ML injection Inject 44 Units into the skin 2 (two) times daily with a meal.   metFORMIN 1000 MG tablet Commonly known as:  GLUCOPHAGE Take 500 mg by mouth 2 (two) times daily.   methocarbamol 500 MG tablet Commonly known as:  ROBAXIN Take 1 tablet (500 mg total) by mouth every 6 (six) hours as needed for muscle spasms.   multivitamin with minerals Tabs tablet Take 1 tablet by mouth daily.   omeprazole 20 MG capsule Commonly known as:  PRILOSEC Take 20 mg by mouth daily.   oxyCODONE 5 MG immediate release tablet Commonly known as:  Oxy IR/ROXICODONE Take 1-2 tablets (5-10 mg total) by mouth every 6 (six) hours as needed for moderate pain (pain score 4-6).   traMADol 50 MG tablet Commonly known as:  ULTRAM Take 1-2 tablets (50-100 mg total) by mouth every 6 (six) hours as needed for moderate pain (unresponsive to oxycodone).   venlafaxine XR 75 MG 24 hr capsule Commonly known as:  EFFEXOR-XR Take 75 mg by mouth daily.            Discharge Care Instructions  (From admission, onward)         Start     Ordered   10/09/18 0000  Weight bearing as tolerated     10/09/18 0714   10/09/18 0000  Change dressing    Comments:  Change the dressing daily with sterile 4 x 4 inch gauze dressing and apply TED hose.   10/09/18 7425         Follow-up Information    [name], MD. Go on 10/23/2018.   Specialty:  Orthopedic Surgery Why:  You are scheduled for a post-operative appointment with Dr. Wynelle Link on 10-23-2018 at 4:00 pm.  Contact information: [address] [place] [address] [place] [address] [phone]        [facility]. Go on 10/25/2018.   Why:  You are scheduled for a Physical Therapy evaluation with  Pricilla Riffle at [facility] on 10-25-2018 at 3:00 pm. Contact information: [address], [place], [place] [address] [phone]       [facility] Follow up.   Specialty:  [place] Why:  physical therapy Contact information: [address] [place] [address] [phone]           Signed: Theresa Duty, PA-C Orthopedic Surgery 10/15/2018, 8:44 AM

## 2018-10-17 DIAGNOSIS — M1711 Unilateral primary osteoarthritis, right knee: Secondary | ICD-10-CM | POA: Diagnosis not present

## 2018-10-17 DIAGNOSIS — E1140 Type 2 diabetes mellitus with diabetic neuropathy, unspecified: Secondary | ICD-10-CM | POA: Diagnosis not present

## 2018-10-17 DIAGNOSIS — R269 Unspecified abnormalities of gait and mobility: Secondary | ICD-10-CM | POA: Diagnosis not present

## 2018-10-17 DIAGNOSIS — M6281 Muscle weakness (generalized): Secondary | ICD-10-CM | POA: Diagnosis not present

## 2018-10-19 DIAGNOSIS — M6281 Muscle weakness (generalized): Secondary | ICD-10-CM | POA: Diagnosis not present

## 2018-10-19 DIAGNOSIS — E1140 Type 2 diabetes mellitus with diabetic neuropathy, unspecified: Secondary | ICD-10-CM | POA: Diagnosis not present

## 2018-10-19 DIAGNOSIS — M1711 Unilateral primary osteoarthritis, right knee: Secondary | ICD-10-CM | POA: Diagnosis not present

## 2018-10-19 DIAGNOSIS — R269 Unspecified abnormalities of gait and mobility: Secondary | ICD-10-CM | POA: Diagnosis not present

## 2018-10-22 ENCOUNTER — Ambulatory Visit: Payer: Medicare HMO | Admitting: Adult Health

## 2018-10-22 DIAGNOSIS — R269 Unspecified abnormalities of gait and mobility: Secondary | ICD-10-CM | POA: Diagnosis not present

## 2018-10-22 DIAGNOSIS — E1140 Type 2 diabetes mellitus with diabetic neuropathy, unspecified: Secondary | ICD-10-CM | POA: Diagnosis not present

## 2018-10-22 DIAGNOSIS — M6281 Muscle weakness (generalized): Secondary | ICD-10-CM | POA: Diagnosis not present

## 2018-10-22 DIAGNOSIS — M1711 Unilateral primary osteoarthritis, right knee: Secondary | ICD-10-CM | POA: Diagnosis not present

## 2018-10-24 DIAGNOSIS — M6281 Muscle weakness (generalized): Secondary | ICD-10-CM | POA: Diagnosis not present

## 2018-10-24 DIAGNOSIS — M1711 Unilateral primary osteoarthritis, right knee: Secondary | ICD-10-CM | POA: Diagnosis not present

## 2018-10-24 DIAGNOSIS — R269 Unspecified abnormalities of gait and mobility: Secondary | ICD-10-CM | POA: Diagnosis not present

## 2018-10-24 DIAGNOSIS — E1140 Type 2 diabetes mellitus with diabetic neuropathy, unspecified: Secondary | ICD-10-CM | POA: Diagnosis not present

## 2018-10-25 ENCOUNTER — Other Ambulatory Visit: Payer: Self-pay | Admitting: Interventional Cardiology

## 2018-10-25 DIAGNOSIS — M25561 Pain in right knee: Secondary | ICD-10-CM | POA: Diagnosis not present

## 2018-10-25 NOTE — Telephone Encounter (Signed)
Pt last saw Dr Tamala Julian 07/30/18, last labs 10/13/18 Creat 0.72, age 78, weight 129.2, based on specified criteria pt is on appropriate dosage of Eliquis 5mg  BID.  Will refill rx.

## 2018-10-30 DIAGNOSIS — M25561 Pain in right knee: Secondary | ICD-10-CM | POA: Diagnosis not present

## 2018-10-31 DIAGNOSIS — Z794 Long term (current) use of insulin: Secondary | ICD-10-CM | POA: Diagnosis not present

## 2018-10-31 DIAGNOSIS — E113292 Type 2 diabetes mellitus with mild nonproliferative diabetic retinopathy without macular edema, left eye: Secondary | ICD-10-CM | POA: Diagnosis not present

## 2018-10-31 DIAGNOSIS — H4311 Vitreous hemorrhage, right eye: Secondary | ICD-10-CM | POA: Diagnosis not present

## 2018-10-31 DIAGNOSIS — E113591 Type 2 diabetes mellitus with proliferative diabetic retinopathy without macular edema, right eye: Secondary | ICD-10-CM | POA: Diagnosis not present

## 2018-11-02 DIAGNOSIS — M25561 Pain in right knee: Secondary | ICD-10-CM | POA: Diagnosis not present

## 2018-11-03 DIAGNOSIS — R69 Illness, unspecified: Secondary | ICD-10-CM | POA: Diagnosis not present

## 2018-11-05 DIAGNOSIS — M25561 Pain in right knee: Secondary | ICD-10-CM | POA: Diagnosis not present

## 2018-11-07 DIAGNOSIS — I480 Paroxysmal atrial fibrillation: Secondary | ICD-10-CM | POA: Diagnosis not present

## 2018-11-07 DIAGNOSIS — E1140 Type 2 diabetes mellitus with diabetic neuropathy, unspecified: Secondary | ICD-10-CM | POA: Diagnosis not present

## 2018-11-07 DIAGNOSIS — I10 Essential (primary) hypertension: Secondary | ICD-10-CM | POA: Diagnosis not present

## 2018-11-07 DIAGNOSIS — M179 Osteoarthritis of knee, unspecified: Secondary | ICD-10-CM | POA: Diagnosis not present

## 2018-11-07 DIAGNOSIS — R413 Other amnesia: Secondary | ICD-10-CM | POA: Diagnosis not present

## 2018-11-07 DIAGNOSIS — Z6836 Body mass index (BMI) 36.0-36.9, adult: Secondary | ICD-10-CM | POA: Diagnosis not present

## 2018-11-09 DIAGNOSIS — M25561 Pain in right knee: Secondary | ICD-10-CM | POA: Diagnosis not present

## 2018-11-13 DIAGNOSIS — Z471 Aftercare following joint replacement surgery: Secondary | ICD-10-CM | POA: Diagnosis not present

## 2018-11-13 DIAGNOSIS — M25561 Pain in right knee: Secondary | ICD-10-CM | POA: Diagnosis not present

## 2018-11-13 DIAGNOSIS — Z96651 Presence of right artificial knee joint: Secondary | ICD-10-CM | POA: Diagnosis not present

## 2018-11-15 DIAGNOSIS — M25561 Pain in right knee: Secondary | ICD-10-CM | POA: Diagnosis not present

## 2018-11-16 DIAGNOSIS — H4311 Vitreous hemorrhage, right eye: Secondary | ICD-10-CM | POA: Diagnosis not present

## 2018-11-16 DIAGNOSIS — H35373 Puckering of macula, bilateral: Secondary | ICD-10-CM | POA: Diagnosis not present

## 2018-11-16 DIAGNOSIS — H43813 Vitreous degeneration, bilateral: Secondary | ICD-10-CM | POA: Diagnosis not present

## 2018-11-16 DIAGNOSIS — H25813 Combined forms of age-related cataract, bilateral: Secondary | ICD-10-CM | POA: Diagnosis not present

## 2018-11-16 DIAGNOSIS — E113591 Type 2 diabetes mellitus with proliferative diabetic retinopathy without macular edema, right eye: Secondary | ICD-10-CM | POA: Diagnosis not present

## 2018-11-19 DIAGNOSIS — M25561 Pain in right knee: Secondary | ICD-10-CM | POA: Diagnosis not present

## 2018-11-23 DIAGNOSIS — M25561 Pain in right knee: Secondary | ICD-10-CM | POA: Diagnosis not present

## 2018-12-21 DIAGNOSIS — H4311 Vitreous hemorrhage, right eye: Secondary | ICD-10-CM | POA: Diagnosis not present

## 2018-12-21 DIAGNOSIS — E113591 Type 2 diabetes mellitus with proliferative diabetic retinopathy without macular edema, right eye: Secondary | ICD-10-CM | POA: Diagnosis not present

## 2018-12-21 DIAGNOSIS — H35373 Puckering of macula, bilateral: Secondary | ICD-10-CM | POA: Diagnosis not present

## 2018-12-21 DIAGNOSIS — H43813 Vitreous degeneration, bilateral: Secondary | ICD-10-CM | POA: Diagnosis not present

## 2018-12-26 ENCOUNTER — Inpatient Hospital Stay: Payer: Medicare HMO

## 2019-01-02 ENCOUNTER — Ambulatory Visit: Payer: Medicare HMO | Admitting: Adult Health

## 2019-01-02 DIAGNOSIS — E113293 Type 2 diabetes mellitus with mild nonproliferative diabetic retinopathy without macular edema, bilateral: Secondary | ICD-10-CM | POA: Diagnosis not present

## 2019-01-02 DIAGNOSIS — H35373 Puckering of macula, bilateral: Secondary | ICD-10-CM | POA: Diagnosis not present

## 2019-01-02 DIAGNOSIS — Z794 Long term (current) use of insulin: Secondary | ICD-10-CM | POA: Diagnosis not present

## 2019-01-02 DIAGNOSIS — E113591 Type 2 diabetes mellitus with proliferative diabetic retinopathy without macular edema, right eye: Secondary | ICD-10-CM | POA: Diagnosis not present

## 2019-01-02 DIAGNOSIS — H3561 Retinal hemorrhage, right eye: Secondary | ICD-10-CM | POA: Diagnosis not present

## 2019-02-05 ENCOUNTER — Other Ambulatory Visit: Payer: Self-pay | Admitting: Dermatology

## 2019-02-05 DIAGNOSIS — D0439 Carcinoma in situ of skin of other parts of face: Secondary | ICD-10-CM | POA: Diagnosis not present

## 2019-02-05 DIAGNOSIS — D044 Carcinoma in situ of skin of scalp and neck: Secondary | ICD-10-CM | POA: Diagnosis not present

## 2019-02-19 ENCOUNTER — Telehealth: Payer: Self-pay | Admitting: Interventional Cardiology

## 2019-02-19 DIAGNOSIS — I5032 Chronic diastolic (congestive) heart failure: Secondary | ICD-10-CM

## 2019-02-19 DIAGNOSIS — I480 Paroxysmal atrial fibrillation: Secondary | ICD-10-CM

## 2019-02-19 NOTE — Telephone Encounter (Signed)
  Pt c/o swelling: STAT is pt has developed SOB within 24 hours  1) How much weight have you gained and in what time span? Not sure  2) If swelling, where is the swelling located?  Both Ankles, feet  3) Are you currently taking a fluid pill? Yes 40mg   4) Are you currently SOB? no  5) Do you have a log of your daily weights (if so, list)? no  6) Have you gained 3 pounds in a day or 5 pounds in a week? Not sure  7) Have you traveled recently? No  Swelling going on for two months but gotten worse. Furosemide worked well at first but since April he has had more swelling

## 2019-02-19 NOTE — Telephone Encounter (Signed)
Pt states for the last 2 months he has had worsening of swelling in both feet and ankles, now moved into his toes.  Improves with elevation.  States they will be almost back to normal in the mornings but about an hour or two after getting up they are swollen again.  No vitals or weights available.  Pt admits for the last 2-3 months, he and his wife have switched to eating frozen dinners 1-2x/day.  Advised pt to cut back or eliminate these dinners as they are high in sodium. Pt currently taking Furosemide 40mg  QD.

## 2019-02-20 DIAGNOSIS — H4311 Vitreous hemorrhage, right eye: Secondary | ICD-10-CM | POA: Diagnosis not present

## 2019-02-20 DIAGNOSIS — E113591 Type 2 diabetes mellitus with proliferative diabetic retinopathy without macular edema, right eye: Secondary | ICD-10-CM | POA: Diagnosis not present

## 2019-02-20 DIAGNOSIS — Z794 Long term (current) use of insulin: Secondary | ICD-10-CM | POA: Diagnosis not present

## 2019-02-20 DIAGNOSIS — H35373 Puckering of macula, bilateral: Secondary | ICD-10-CM | POA: Diagnosis not present

## 2019-02-22 NOTE — Telephone Encounter (Signed)
Needs CBC, CMET and BNP to determine if edema is due to liver, heart, kidneys, or anemia

## 2019-02-22 NOTE — Telephone Encounter (Signed)
Attempted to contact pt.  VM was full. Orders placed for labs.  Just needs to be scheduled and screened for COVID.

## 2019-02-25 NOTE — Telephone Encounter (Signed)
Attempted to contact pt again.  Phone rang several times with no answer but VM did not connect.  Will try again later.

## 2019-02-26 NOTE — Telephone Encounter (Signed)
Follow Up:; ° ° °Returning your call. °

## 2019-02-26 NOTE — Telephone Encounter (Signed)
Spoke with pt and scheduled him for labs on 6/18.  Pt in agreement with plan.      COVID-19 Pre-Screening Questions:  . In the past 7 to 10 days have you had a cough,  shortness of breath, headache, congestion, fever (100 or greater) body aches, chills, sore throat, or sudden loss of taste or sense of smell? No . Have you been around anyone with known Covid 19. . Have you been around anyone who is awaiting Covid 19 test results in the past 7 to 10 days? No . Have you been around anyone who has been exposed to Covid 19, or has mentioned symptoms of Covid 19 within the past 7 to 10 days? No  If you have any concerns/questions about symptoms patients report during screening (either on the phone or at threshold). Contact the provider seeing the patient or DOD for further guidance.  If neither are available contact a member of the leadership team.

## 2019-02-26 NOTE — Telephone Encounter (Signed)
Attempted to contact pt again.  Phone rang several times with no answer and no VM, then call dropped.  Will try again later.

## 2019-02-28 ENCOUNTER — Other Ambulatory Visit: Payer: Medicare HMO | Admitting: *Deleted

## 2019-02-28 ENCOUNTER — Other Ambulatory Visit: Payer: Self-pay

## 2019-02-28 DIAGNOSIS — I480 Paroxysmal atrial fibrillation: Secondary | ICD-10-CM

## 2019-02-28 DIAGNOSIS — I5032 Chronic diastolic (congestive) heart failure: Secondary | ICD-10-CM | POA: Diagnosis not present

## 2019-02-28 DIAGNOSIS — D0439 Carcinoma in situ of skin of other parts of face: Secondary | ICD-10-CM | POA: Diagnosis not present

## 2019-03-01 ENCOUNTER — Telehealth: Payer: Self-pay | Admitting: *Deleted

## 2019-03-01 DIAGNOSIS — E875 Hyperkalemia: Secondary | ICD-10-CM

## 2019-03-01 LAB — HEPATIC FUNCTION PANEL
ALT: 34 IU/L (ref 0–44)
AST: 25 IU/L (ref 0–40)
Albumin: 3.9 g/dL (ref 3.7–4.7)
Alkaline Phosphatase: 125 IU/L — ABNORMAL HIGH (ref 39–117)
Bilirubin Total: 0.6 mg/dL (ref 0.0–1.2)
Bilirubin, Direct: 0.21 mg/dL (ref 0.00–0.40)
Total Protein: 6.7 g/dL (ref 6.0–8.5)

## 2019-03-01 LAB — BASIC METABOLIC PANEL
BUN/Creatinine Ratio: 24 (ref 10–24)
BUN: 22 mg/dL (ref 8–27)
CO2: 27 mmol/L (ref 20–29)
Calcium: 9.2 mg/dL (ref 8.6–10.2)
Chloride: 99 mmol/L (ref 96–106)
Creatinine, Ser: 0.9 mg/dL (ref 0.76–1.27)
GFR calc Af Amer: 96 mL/min/{1.73_m2} (ref >59)
GFR calc non Af Amer: 83 mL/min/{1.73_m2} (ref >59)
Glucose: 165 mg/dL — ABNORMAL HIGH (ref 65–99)
Potassium: 5.6 mmol/L — ABNORMAL HIGH (ref 3.5–5.2)
Sodium: 139 mmol/L (ref 134–144)

## 2019-03-01 LAB — CBC
Hematocrit: 36.7 % — ABNORMAL LOW (ref 37.5–51.0)
Hemoglobin: 12.5 g/dL — ABNORMAL LOW (ref 13.0–17.7)
MCH: 32.3 pg (ref 26.6–33.0)
MCHC: 34.1 g/dL (ref 31.5–35.7)
MCV: 95 fL (ref 79–97)
Platelets: 811 10*3/uL (ref 150–450)
RBC: 3.87 x10E6/uL — ABNORMAL LOW (ref 4.14–5.80)
RDW: 16.1 % — ABNORMAL HIGH (ref 11.6–15.4)
WBC: 9.8 10*3/uL (ref 3.4–10.8)

## 2019-03-01 LAB — PRO B NATRIURETIC PEPTIDE: NT-Pro BNP: 416 pg/mL (ref 0–486)

## 2019-03-01 NOTE — Telephone Encounter (Signed)
-----   Message from Belva Crome, MD sent at 03/01/2019  1:41 PM EDT ----- Let the patient know the potassium is high. Avoid dietary potassium (OJ, Banana's, raisins, tomatoes, etc.. Stop Benazapril over weekend. BMET Monday. Also platelet count high, and he should discuss with Dr. Jana Hakim. A copy will be sent to [name], MD

## 2019-03-01 NOTE — Telephone Encounter (Signed)
Pt denies COVID sx or being around anyone dx or tested recently.

## 2019-03-01 NOTE — Telephone Encounter (Signed)
Spoke with pt and went over results and recommendations per Dr. Tamala Julian.  Pt eats a lot of dietary K+ and states he will cut back on that. Pt verbalized understanding and was in agreement with plan.   Pt also mentioned that Dr. Jana Hakim was aware of his platelets.  Pt states he also went without his Hydroxyurea for about 2 weeks because it was on back order from the manufacturer.

## 2019-03-04 ENCOUNTER — Other Ambulatory Visit: Payer: Medicare HMO | Admitting: *Deleted

## 2019-03-04 ENCOUNTER — Other Ambulatory Visit: Payer: Self-pay

## 2019-03-04 DIAGNOSIS — E875 Hyperkalemia: Secondary | ICD-10-CM

## 2019-03-04 LAB — BASIC METABOLIC PANEL
BUN/Creatinine Ratio: 18 (ref 10–24)
BUN: 17 mg/dL (ref 8–27)
CO2: 24 mmol/L (ref 20–29)
Calcium: 9.1 mg/dL (ref 8.6–10.2)
Chloride: 101 mmol/L (ref 96–106)
Creatinine, Ser: 0.93 mg/dL (ref 0.76–1.27)
GFR calc Af Amer: 93 mL/min/{1.73_m2} (ref >59)
GFR calc non Af Amer: 80 mL/min/{1.73_m2} (ref >59)
Glucose: 177 mg/dL — ABNORMAL HIGH (ref 65–99)
Potassium: 4.6 mmol/L (ref 3.5–5.2)
Sodium: 138 mmol/L (ref 134–144)

## 2019-03-05 DIAGNOSIS — E119 Type 2 diabetes mellitus without complications: Secondary | ICD-10-CM | POA: Diagnosis not present

## 2019-03-05 DIAGNOSIS — H5203 Hypermetropia, bilateral: Secondary | ICD-10-CM | POA: Diagnosis not present

## 2019-03-05 DIAGNOSIS — H524 Presbyopia: Secondary | ICD-10-CM | POA: Diagnosis not present

## 2019-03-05 DIAGNOSIS — H52203 Unspecified astigmatism, bilateral: Secondary | ICD-10-CM | POA: Diagnosis not present

## 2019-03-05 DIAGNOSIS — H2513 Age-related nuclear cataract, bilateral: Secondary | ICD-10-CM | POA: Diagnosis not present

## 2019-03-05 DIAGNOSIS — Z7984 Long term (current) use of oral hypoglycemic drugs: Secondary | ICD-10-CM | POA: Diagnosis not present

## 2019-03-07 DIAGNOSIS — Z96651 Presence of right artificial knee joint: Secondary | ICD-10-CM | POA: Diagnosis not present

## 2019-03-07 DIAGNOSIS — Z471 Aftercare following joint replacement surgery: Secondary | ICD-10-CM | POA: Diagnosis not present

## 2019-03-08 ENCOUNTER — Other Ambulatory Visit: Payer: Self-pay | Admitting: Oncology

## 2019-03-08 DIAGNOSIS — R69 Illness, unspecified: Secondary | ICD-10-CM | POA: Diagnosis not present

## 2019-03-08 NOTE — Progress Notes (Unsigned)
Mr. Van platelets are up.  I do not know if he is being compliant with the Hydrea.  If he has been we need to increase the dose.  I was unable to reach him, left him a message to call us back.

## 2019-03-12 DIAGNOSIS — R69 Illness, unspecified: Secondary | ICD-10-CM | POA: Diagnosis not present

## 2019-03-25 DIAGNOSIS — M25561 Pain in right knee: Secondary | ICD-10-CM | POA: Diagnosis not present

## 2019-03-27 ENCOUNTER — Inpatient Hospital Stay: Payer: Medicare HMO | Attending: Oncology

## 2019-03-27 ENCOUNTER — Other Ambulatory Visit: Payer: Self-pay | Admitting: Oncology

## 2019-03-27 ENCOUNTER — Other Ambulatory Visit: Payer: Self-pay

## 2019-03-27 DIAGNOSIS — D689 Coagulation defect, unspecified: Secondary | ICD-10-CM

## 2019-03-27 DIAGNOSIS — Z79899 Other long term (current) drug therapy: Secondary | ICD-10-CM | POA: Insufficient documentation

## 2019-03-27 DIAGNOSIS — R918 Other nonspecific abnormal finding of lung field: Secondary | ICD-10-CM | POA: Insufficient documentation

## 2019-03-27 DIAGNOSIS — I480 Paroxysmal atrial fibrillation: Secondary | ICD-10-CM

## 2019-03-27 DIAGNOSIS — I5032 Chronic diastolic (congestive) heart failure: Secondary | ICD-10-CM

## 2019-03-27 DIAGNOSIS — I495 Sick sinus syndrome: Secondary | ICD-10-CM

## 2019-03-27 DIAGNOSIS — D75839 Thrombocytosis, unspecified: Secondary | ICD-10-CM

## 2019-03-27 DIAGNOSIS — I63512 Cerebral infarction due to unspecified occlusion or stenosis of left middle cerebral artery: Secondary | ICD-10-CM

## 2019-03-27 DIAGNOSIS — E08311 Diabetes mellitus due to underlying condition with unspecified diabetic retinopathy with macular edema: Secondary | ICD-10-CM

## 2019-03-27 DIAGNOSIS — D473 Essential (hemorrhagic) thrombocythemia: Secondary | ICD-10-CM | POA: Diagnosis not present

## 2019-03-27 DIAGNOSIS — E119 Type 2 diabetes mellitus without complications: Secondary | ICD-10-CM

## 2019-03-27 DIAGNOSIS — E0842 Diabetes mellitus due to underlying condition with diabetic polyneuropathy: Secondary | ICD-10-CM

## 2019-03-27 LAB — COMPREHENSIVE METABOLIC PANEL
ALT: 26 U/L (ref 0–44)
AST: 21 U/L (ref 15–41)
Albumin: 3.4 g/dL — ABNORMAL LOW (ref 3.5–5.0)
Alkaline Phosphatase: 148 U/L — ABNORMAL HIGH (ref 38–126)
Anion gap: 9 (ref 5–15)
BUN: 20 mg/dL (ref 8–23)
CO2: 28 mmol/L (ref 22–32)
Calcium: 8.8 mg/dL — ABNORMAL LOW (ref 8.9–10.3)
Chloride: 100 mmol/L (ref 98–111)
Creatinine, Ser: 1.06 mg/dL (ref 0.61–1.24)
GFR calc Af Amer: >60 mL/min (ref >60)
GFR calc non Af Amer: >60 mL/min (ref >60)
Glucose, Bld: 247 mg/dL — ABNORMAL HIGH (ref 70–99)
Potassium: 4.4 mmol/L (ref 3.5–5.1)
Sodium: 137 mmol/L (ref 135–145)
Total Bilirubin: 0.6 mg/dL (ref 0.3–1.2)
Total Protein: 6.6 g/dL (ref 6.5–8.1)

## 2019-03-27 LAB — CBC WITH DIFFERENTIAL/PLATELET
Abs Immature Granulocytes: 0.03 10*3/uL (ref 0.00–0.07)
Basophils Absolute: 0.2 10*3/uL — ABNORMAL HIGH (ref 0.0–0.1)
Basophils Relative: 2 %
Eosinophils Absolute: 0.2 10*3/uL (ref 0.0–0.5)
Eosinophils Relative: 2 %
HCT: 34.9 % — ABNORMAL LOW (ref 39.0–52.0)
Hemoglobin: 11.4 g/dL — ABNORMAL LOW (ref 13.0–17.0)
Immature Granulocytes: 0 %
Lymphocytes Relative: 20 %
Lymphs Abs: 1.4 10*3/uL (ref 0.7–4.0)
MCH: 32 pg (ref 26.0–34.0)
MCHC: 32.7 g/dL (ref 30.0–36.0)
MCV: 98 fL (ref 80.0–100.0)
Monocytes Absolute: 0.6 10*3/uL (ref 0.1–1.0)
Monocytes Relative: 9 %
Neutro Abs: 4.6 10*3/uL (ref 1.7–7.7)
Neutrophils Relative %: 67 %
Platelets: 676 10*3/uL — ABNORMAL HIGH (ref 150–400)
RBC: 3.56 MIL/uL — ABNORMAL LOW (ref 4.22–5.81)
RDW: 18.2 % — ABNORMAL HIGH (ref 11.5–15.5)
WBC: 7 10*3/uL (ref 4.0–10.5)
nRBC: 0 % (ref 0.0–0.2)

## 2019-03-28 DIAGNOSIS — M25561 Pain in right knee: Secondary | ICD-10-CM | POA: Diagnosis not present

## 2019-04-01 DIAGNOSIS — M25561 Pain in right knee: Secondary | ICD-10-CM | POA: Diagnosis not present

## 2019-04-04 DIAGNOSIS — M25561 Pain in right knee: Secondary | ICD-10-CM | POA: Diagnosis not present

## 2019-04-08 DIAGNOSIS — M25561 Pain in right knee: Secondary | ICD-10-CM | POA: Diagnosis not present

## 2019-04-11 DIAGNOSIS — M25561 Pain in right knee: Secondary | ICD-10-CM | POA: Diagnosis not present

## 2019-04-15 DIAGNOSIS — M25561 Pain in right knee: Secondary | ICD-10-CM | POA: Diagnosis not present

## 2019-04-18 DIAGNOSIS — M25561 Pain in right knee: Secondary | ICD-10-CM | POA: Diagnosis not present

## 2019-04-22 DIAGNOSIS — M25561 Pain in right knee: Secondary | ICD-10-CM | POA: Diagnosis not present

## 2019-04-25 DIAGNOSIS — M25561 Pain in right knee: Secondary | ICD-10-CM | POA: Diagnosis not present

## 2019-04-29 DIAGNOSIS — Z0100 Encounter for examination of eyes and vision without abnormal findings: Secondary | ICD-10-CM | POA: Diagnosis not present

## 2019-04-30 DIAGNOSIS — H524 Presbyopia: Secondary | ICD-10-CM | POA: Diagnosis not present

## 2019-05-01 DIAGNOSIS — M25561 Pain in right knee: Secondary | ICD-10-CM | POA: Diagnosis not present

## 2019-05-03 DIAGNOSIS — M25561 Pain in right knee: Secondary | ICD-10-CM | POA: Diagnosis not present

## 2019-05-08 DIAGNOSIS — M25532 Pain in left wrist: Secondary | ICD-10-CM | POA: Diagnosis not present

## 2019-05-08 DIAGNOSIS — M67432 Ganglion, left wrist: Secondary | ICD-10-CM | POA: Diagnosis not present

## 2019-05-18 DIAGNOSIS — I495 Sick sinus syndrome: Secondary | ICD-10-CM

## 2019-05-18 DIAGNOSIS — I5032 Chronic diastolic (congestive) heart failure: Secondary | ICD-10-CM

## 2019-05-22 ENCOUNTER — Ambulatory Visit: Payer: Medicare HMO | Admitting: Adult Health

## 2019-05-22 ENCOUNTER — Other Ambulatory Visit: Payer: Self-pay

## 2019-05-22 ENCOUNTER — Encounter: Payer: Self-pay | Admitting: Adult Health

## 2019-05-22 VITALS — BP 119/60 | HR 59 | Temp 95.7°F | Ht 72.0 in | Wt 290.6 lb

## 2019-05-22 DIAGNOSIS — E785 Hyperlipidemia, unspecified: Secondary | ICD-10-CM | POA: Diagnosis not present

## 2019-05-22 DIAGNOSIS — Z794 Long term (current) use of insulin: Secondary | ICD-10-CM

## 2019-05-22 DIAGNOSIS — I480 Paroxysmal atrial fibrillation: Secondary | ICD-10-CM

## 2019-05-22 DIAGNOSIS — I10 Essential (primary) hypertension: Secondary | ICD-10-CM | POA: Diagnosis not present

## 2019-05-22 DIAGNOSIS — Z9114 Patient's other noncompliance with medication regimen: Secondary | ICD-10-CM | POA: Diagnosis not present

## 2019-05-22 DIAGNOSIS — I63412 Cerebral infarction due to embolism of left middle cerebral artery: Secondary | ICD-10-CM

## 2019-05-22 DIAGNOSIS — E119 Type 2 diabetes mellitus without complications: Secondary | ICD-10-CM | POA: Diagnosis not present

## 2019-05-22 DIAGNOSIS — G4733 Obstructive sleep apnea (adult) (pediatric): Secondary | ICD-10-CM | POA: Diagnosis not present

## 2019-05-22 NOTE — Progress Notes (Signed)
[name] [address] [place]. [place] [address] [phone]       OFFICE FOLLOW-UP NOTE  [name] Date of Birth:  Apr 19, 1941 Medical Record Number:  [id number]   Reason for visit: Stroke follow-up [facility] provider: Dr. Leonie Man   Chief Complaint  Patient presents with   Follow-up    6 mon f/u. Alone. Rm 9. No new concerns at this time.      HPI:  Stroke admission 07/2017 PS: [name] a 78 y.o.malewith a history of afib on Xarelto who has been having difficulty speaking since awakening this morning. He states that it seems worse at times, butthese episodes of worsening are only for a few seconds. He has also had two episodes of right sided numbness lasting a few seconds as well. 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.LKW:11/27 prior to bed.tpa given?: no,out of window.CT scan of the head showed acute small left frontal MCA territory nonhemorrhagic infarct and moderate changes of small vessel disease. 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 CT angiogram of the brain showed moderate stenosis of left M2 and proximal right posterior cerebral arteries.MRI scan of the brain confirmed a small foci of acute infarcts in the posterior left MCA territory involving posterior frontal and posterior parietal lobes likely emboli. Patient had known history of atrial fibrillation and was on Xarelto and yet had breakthrough infarcts.hemoglobin A1c was elevated at 7.8. Patient was changed from Xarelto to eliquis for second stroke prevention.patient had elevated platelet count of 881,000 which was up from a year ago from 623,000. 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 Bone marrow biopsy was discussed but not done.   10/11/17 visit PS: Patient has been started on hydroxyurea by oncology. Patient states that he  still has some intermittent numbness and tingling in his right hand but it is getting better it occurs once or twice a week and last only 30 seconds. This is often triggered by having his neck or arms in strange positions like stretching backwards. He is tolerating eliquis well without bleeding or bruising. He states his blood pressure is well controlled and today it is 130/79. He continues to have trouble with his sugars which remained high and last hemoglobin A1c was 8.1. Patient has chronic right knee pain and actually had scheduled right knee surgery with Dr. Juliette Alcide in February that now is willing to wait for 6 months since his stroke. He does also have sleep apnea but he has not been compliant with CPAP as he cannot tolerate it. He does have chronic paresthesias in his feet from diabetic neuropathy which is stable   Update 04/17/2018: Patient is being seen today for routine stroke follow-up appointment and overall is doing well from a stroke standpoint.  He continues to take Eliquis without bleeding or bruising for his atrial fibrillation and is managed by his cardiologist.  Continues to take Lipitor without side effects of myalgias.  Blood pressure today satisfactory 128/60.  He does have history of bilateral lower extremity diabetic neuropathy for which he takes gabapentin 300 mg at night.  He continues to have neuropathy pain along with possibly worsening neuropathy with numbness and tingling going up into his calfs.  He has been compliant with gabapentin 300 mg at night but states he does not notice a difference with his neuropathy pain.  He  has had a recent fall approximately 1 week ago where he was try to let his dog outside and when he bent over to help him out the door, he fell forward and landed on his right side.  He denies hitting his head but does have mild residual right sided pain. Patient states his knee limits him from prolonged activity and standing for any length of time along with increased  difficulty ambulating.  Patient also has complaints of bilateral lower extremity swelling.  Patient did speak with cardiologist in regards to this and recommended compression stockings but per notes, he was refusing his treatment and no additional intervention needed.  Patient continues to be noncompliant with CPAP stating he is unable to tolerate machine despite risks of not having OSA treated.  Denies new or worsening stroke/TIA symptoms.  Update 05/22/2019: [name] is being seen today for stroke follow-up.  He has been doing well from a stroke standpoint without residual deficits or reoccurring symptoms.  He continues on Eliquis for atrial fibrillation secondary stroke prevention without side effects.  Continues on atorvastatin without myalgias.  Blood pressure today 119/60.  He did undergo right knee arthroplasty on 6/60/6301 without complication.  He recently completed physical therapy but is considering participating in additional sessions.  Glucose levels have been stable. Denies new or worsening stroke/TIA symptoms. He has multiple other complaints including blurred vision, leg swelling, and insomnia.  He is routinely being followed by ophthalmology, cardiology and PCP in regards to these concerns.    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   Depression    Diabetes mellitus without complication (HCC)    Dysrhythmia    a-fib   GERD (gastroesophageal reflux disease)    Hyperlipidemia    Hypertension    Neuropathy    Obesity    Paroxysmal atrial fibrillation (HCC)    Peripheral vascular disease (HCC)    diabetic neuropathy in both feet   Sleep apnea    uses C-pap machine   Stroke ([place])    08/09/2017    Social History:  Social History    Socioeconomic History   Marital status: Married    Spouse name: Not on file   Number of children: Not on file   Years of education: Not on file   Highest education level: Not on file  Occupational History   Not on file  Social Needs   Financial resource strain: Not on file   Food insecurity    Worry: Not on file    Inability: Not on file   Transportation needs    Medical: Not on file    Non-medical: Not on file  Tobacco Use   Smoking status: Former Smoker    Packs/day: 1.00    Years: 30.00    Pack years: 30.00    Types: Cigars    Quit date: 08/02/1992    Years since quitting: 26.8   Smokeless tobacco: Never Used  Substance and Sexual Activity   Alcohol use: Yes    Alcohol/week: 0.0 standard drinks    Comment: 1 beer a night.    Drug use: No   Sexual activity: Not on file  Lifestyle   Physical activity    Days per week: Not on  file    Minutes per session: Not on file   Stress: Not on file  Relationships   Social connections    Talks on phone: Not on file    Gets together: Not on file    Attends religious service: Not on file    Active member of club or organization: Not on file    Attends meetings of clubs or organizations: Not on file    Relationship status: Not on file   Intimate partner violence    Fear of current or ex partner: Not on file    Emotionally abused: Not on file    Physically abused: Not on file    Forced sexual activity: Not on file  Other Topics Concern   Not on file  Social History Narrative   Not on file    Medications:   Current Outpatient Medications on File Prior to Visit  Medication Sig Dispense Refill   ALPRAZolam (XANAX) 0.5 MG tablet Take 0.5 mg by mouth 2 (two) times daily.      atorvastatin (LIPITOR) 80 MG tablet Take 1 tablet (80 mg total) by mouth daily at 6 PM. 30 tablet 0   benazepril (LOTENSIN) 40 MG tablet Take 1 tablet (40 mg total) by mouth daily.     ELIQUIS 5 MG TABS tablet TAKE 1 TABLET BY  MOUTH TWICE DAILY 60 tablet 9   ergocalciferol (VITAMIN D2) 50000 units capsule Take 50,000 Units by mouth once a week.      furosemide (LASIX) 40 MG tablet Take 1 tablet (40 mg total) by mouth daily. (Patient taking differently: Take 40 mg by mouth 2 (two) times daily. ) 90 tablet 3   gabapentin (NEURONTIN) 300 MG capsule Take 2 capsules (600 mg total) by mouth at bedtime. 180 capsule 3   hydroxyurea (HYDREA) 500 MG capsule Take 2 capsules (1,000 mg total) by mouth daily. May take with food to minimize GI side effects. 180 capsule 12   insulin aspart (NOVOLOG) 100 UNIT/ML injection Inject 0-15 Units into the skin 3 (three) times daily with meals. Per sliding scale in separate instructions. 6 mL 0   insulin NPH-regular Human (NOVOLIN 70/30) (70-30) 100 UNIT/ML injection Inject 44 Units into the skin 2 (two) times daily with a meal.     metFORMIN (GLUCOPHAGE) 1000 MG tablet Take 500 mg by mouth 2 (two) times daily.     metoprolol tartrate (LOPRESSOR) 50 MG tablet Take 50 mg by mouth 2 (two) times daily.     Multiple Vitamin (MULTIVITAMIN WITH MINERALS) TABS tablet Take 1 tablet by mouth daily.     omeprazole (PRILOSEC) 20 MG capsule Take 20 mg by mouth daily.     traMADol (ULTRAM) 50 MG tablet Take 1-2 tablets (50-100 mg total) by mouth every 6 (six) hours as needed for moderate pain (unresponsive to oxycodone). 40 tablet 0   venlafaxine XR (EFFEXOR-XR) 75 MG 24 hr capsule Take 75 mg by mouth daily.     No current facility-administered medications on file prior to visit.     Allergies:  No Known Allergies  Today's Vitals   05/22/19 1248  BP: 119/60  Pulse: (!) 59  Temp: (!) 95.7 F (35.4 C)  TempSrc: Oral  Weight: 290 lb 9.6 oz (131.8 kg)  Height: 6' (1.829 m)   Body mass index is 39.41 kg/m.   Physical Exam General: well developed, well nourished, pleasant elderly caucasian male, seated, in no evident distress Head: head normocephalic and atraumatic.  Neck: supple with  no carotid or supraclavicular bruits Cardiovascular: regular rate and rhythm, no murmurs; 2+ pitting edema BLE Musculoskeletal: no deformity Skin:  no rash/petichiae Vascular:  Normal pulses all extremities   Neurologic Exam Mental Status: Awake and fully alert. Oriented to place and time. Recent and remote memory intact. Attention span, concentration and fund of knowledge appropriate. Mood and affect appropriate.  Cranial Nerves: Pupils equal, briskly reactive to light. Extraocular movements full without nystagmus. Visual fields full to confrontation. Hearing intact. Facial sensation intact. Face, tongue, palate moves normally and symmetrically.  Motor: Normal bulk and tone. Normal strength in all tested extremity muscles. Sensory.:  Decreased sensation to touch ,pinprick and vibratory sensation in bilateral lower extremities distally. Coordination: Rapid alternating movements normal in all extremities. Finger-to-nose performed accurately and mild difficulty performing heel-to-shin due to pain Gait and Station: Arises from chair with  difficulty.  Gait demonstrates slightly favoring right knee but otherwise normal stride and balance without use of assistive device Reflexes: 1+ and symmetric except ankle jerks are depressed. Toes downgoing.     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 He also has essential thrombocytosis which could have contributed. Vascular risk factors of diabetes, hyperlipidemia, obesity, OSA noncompliant with CPAP,atrial fibrillation and essential thrombocytosis.  Recovered well from a stroke standpoint without residual deficits.   PLAN: -Continue Eliquis (apixaban) daily  and Lipitor for secondary stroke prevention -F/u with PCP regarding your HLD, HTN and DM management -f/u with cardiologist as scheduled for atrial fibrillation and Eliquis management along with concerns of  lower extremity edema - Dr. Tamala Julian cardiologist who continuously addresses at follow-up visit -f/u with oncologist/hematologist as scheduled for essential thrombocytosis -Discussion again regarding importance of CPAP compliance for OSA management for secondary stroke prevention and cardiac concerns especially with today's concerns of insomnia.  He has no interest in use of CPAP despite education.  Advised him that insomnia complaints could be related to untreated OSA along with poor sleep hygiene which was also discussed -continue to monitor BP at home -Advised to continue to stay active as tolerated and maintain a healthy diet -Maintain strict control of hypertension with blood pressure goal below 130/90, diabetes with hemoglobin A1c goal below 6.5% and cholesterol with LDL cholesterol (bad cholesterol) goal below 70 mg/dL. I also advised the patient to eat a healthy diet with plenty of whole grains, cereals, fruits and vegetables, exercise regularly and maintain ideal body weight.  Overall stable from a stroke standpoint recommend follow-up as needed   Greater than 50% of time during this 25 minute visit was spent on counseling,explanation of diagnosis embolic strokes, atrial fibrillation, essential thrombocytosis planning of further management, discussion with patient and family and coordination of care   Frann Rider, [facility]  [facility] [address] [place], [place] [address]  Phone [phone] Fax [phone] Note: This document was prepared with digital dictation and possible smart phrase technology. Any transcriptional errors that result from this process are unintentional.

## 2019-05-22 NOTE — Patient Instructions (Addendum)
Continue Eliquis (apixaban) daily  and Lipitor for secondary stroke prevention  Continue to follow up with PCP regarding cholesterol, blood pressure and diabetes management along with sleeping concerns   Continue to follow with cardiology for atrial fibrillation management and swelling concerns  Continue to monitor blood pressure at home  Highly consider restarting therapy for your knee recovery   Maintain strict control of hypertension with blood pressure goal below 130/90, diabetes with hemoglobin A1c goal below 6.5% and cholesterol with LDL cholesterol (bad cholesterol) goal below 70 mg/dL. I also advised the patient to eat a healthy diet with plenty of whole grains, cereals, fruits and vegetables, exercise regularly and maintain ideal body weight.        Thank you for coming to see [place] at [facility]. I hope we have been able to provide you high quality care today.  You may receive a patient satisfaction survey over the next few weeks. We would appreciate your feedback and comments so that we may continue to improve ourselves and the health of our patients.

## 2019-05-23 NOTE — Progress Notes (Signed)
I agree with the above plan 

## 2019-05-27 ENCOUNTER — Telehealth: Payer: Self-pay | Admitting: Interventional Cardiology

## 2019-05-27 NOTE — Telephone Encounter (Signed)
Follow Up:       Did not need this encounter

## 2019-05-28 ENCOUNTER — Telehealth: Payer: Self-pay | Admitting: Interventional Cardiology

## 2019-05-28 NOTE — Telephone Encounter (Signed)
Patient called stating he would like to speak to nurse about information he received via my chart.

## 2019-05-28 NOTE — Telephone Encounter (Signed)
Pt wanted to clarify that he understood the orders from the MyChart message that was sent to him.  Advised monitor will be mailed, keep appt for echo and labs next week and scheduled pt to come in the office in October to f/u after monitor complete.  Pt asked if there was anything else he could do to help with swelling.  Advised to elevate legs as much as he can, decrease salt in diet, and wear compression stockings if able.  Pt mentioned he has been eating a lot of Ritz crackers the last several months and just noticed the sodium content a couple of days ago.  Pt also drinks a lot of diet soda.  Advised pt to eliminate Ritz crackers and increase water intake.  Pt verbalized understanding and was in agreement with plan.

## 2019-05-28 NOTE — Telephone Encounter (Signed)
Follow up ° ° °Patient is returning your call. Please call. ° ° ° °

## 2019-05-28 NOTE — Telephone Encounter (Signed)
Left message to call back  

## 2019-05-30 ENCOUNTER — Telehealth: Payer: Self-pay | Admitting: *Deleted

## 2019-05-30 NOTE — Telephone Encounter (Signed)
Information regarding ZIO Patch monitor reviewed.

## 2019-05-30 NOTE — Telephone Encounter (Signed)
Unable to leave message, mail box full. Attempted to reach patient to inform 14 day ZIO XT long term holter monitor to be mailed to the patients home.  Instructions included in the monitor kit.  Cannot remove ZIO patch for echocardiogram so do not apply until after echocardiogram appointment on 06/03/19.

## 2019-06-03 ENCOUNTER — Other Ambulatory Visit: Payer: Medicare HMO | Admitting: *Deleted

## 2019-06-03 ENCOUNTER — Ambulatory Visit (HOSPITAL_COMMUNITY): Payer: Medicare HMO | Attending: Cardiology

## 2019-06-03 ENCOUNTER — Other Ambulatory Visit: Payer: Self-pay

## 2019-06-03 DIAGNOSIS — I495 Sick sinus syndrome: Secondary | ICD-10-CM | POA: Diagnosis not present

## 2019-06-03 DIAGNOSIS — I5032 Chronic diastolic (congestive) heart failure: Secondary | ICD-10-CM

## 2019-06-03 LAB — BASIC METABOLIC PANEL
BUN/Creatinine Ratio: 19 (ref 10–24)
BUN: 20 mg/dL (ref 8–27)
CO2: 26 mmol/L (ref 20–29)
Calcium: 9.3 mg/dL (ref 8.6–10.2)
Chloride: 102 mmol/L (ref 96–106)
Creatinine, Ser: 1.07 mg/dL (ref 0.76–1.27)
GFR calc Af Amer: 78 mL/min/{1.73_m2} (ref >59)
GFR calc non Af Amer: 68 mL/min/{1.73_m2} (ref >59)
Glucose: 143 mg/dL — ABNORMAL HIGH (ref 65–99)
Potassium: 4.7 mmol/L (ref 3.5–5.2)
Sodium: 140 mmol/L (ref 134–144)

## 2019-06-03 LAB — CBC
Hematocrit: 33.7 % — ABNORMAL LOW (ref 37.5–51.0)
Hemoglobin: 11.5 g/dL — ABNORMAL LOW (ref 13.0–17.7)
MCH: 32.3 pg (ref 26.6–33.0)
MCHC: 34.1 g/dL (ref 31.5–35.7)
MCV: 95 fL (ref 79–97)
NRBC: 1 % — ABNORMAL HIGH (ref 0–0)
Platelets: 686 10*3/uL — ABNORMAL HIGH (ref 150–450)
RBC: 3.56 x10E6/uL — ABNORMAL LOW (ref 4.14–5.80)
RDW: 18.5 % — ABNORMAL HIGH (ref 11.6–15.4)
WBC: 8.2 10*3/uL (ref 3.4–10.8)

## 2019-06-03 LAB — HEPATIC FUNCTION PANEL
ALT: 31 IU/L (ref 0–44)
AST: 29 IU/L (ref 0–40)
Albumin: 3.8 g/dL (ref 3.7–4.7)
Alkaline Phosphatase: 131 IU/L — ABNORMAL HIGH (ref 39–117)
Bilirubin Total: 0.4 mg/dL (ref 0.0–1.2)
Bilirubin, Direct: 0.17 mg/dL (ref 0.00–0.40)
Total Protein: 6.5 g/dL (ref 6.0–8.5)

## 2019-06-03 LAB — PRO B NATRIURETIC PEPTIDE: NT-Pro BNP: 1269 pg/mL — ABNORMAL HIGH (ref 0–486)

## 2019-06-03 MED ORDER — PERFLUTREN LIPID MICROSPHERE
1.0000 mL | INTRAVENOUS | Status: AC | PRN
  Administered 2019-06-03: 2 mL via INTRAVENOUS

## 2019-06-05 ENCOUNTER — Telehealth: Payer: Self-pay

## 2019-06-05 DIAGNOSIS — Z794 Long term (current) use of insulin: Secondary | ICD-10-CM | POA: Diagnosis not present

## 2019-06-05 DIAGNOSIS — E113553 Type 2 diabetes mellitus with stable proliferative diabetic retinopathy, bilateral: Secondary | ICD-10-CM | POA: Diagnosis not present

## 2019-06-05 DIAGNOSIS — H4311 Vitreous hemorrhage, right eye: Secondary | ICD-10-CM | POA: Diagnosis not present

## 2019-06-05 DIAGNOSIS — I5032 Chronic diastolic (congestive) heart failure: Secondary | ICD-10-CM

## 2019-06-05 DIAGNOSIS — H35373 Puckering of macula, bilateral: Secondary | ICD-10-CM | POA: Diagnosis not present

## 2019-06-05 NOTE — Telephone Encounter (Addendum)
LMTCB Labs and Echo

## 2019-06-05 NOTE — Telephone Encounter (Signed)
-----   Message from Belva Crome, MD sent at 06/04/2019  2:00 PM EDT ----- Let the patient know the elevated BNP suggests diastolic HF since LVEF is normal. Plan add Aldactone 12.5 mg daily. BMET 1 week. May need to further increase the dose of furosemide. A copy will be sent to [name], MD

## 2019-06-06 ENCOUNTER — Ambulatory Visit (INDEPENDENT_AMBULATORY_CARE_PROVIDER_SITE_OTHER): Payer: Medicare HMO

## 2019-06-06 DIAGNOSIS — I495 Sick sinus syndrome: Secondary | ICD-10-CM

## 2019-06-11 MED ORDER — SPIRONOLACTONE 25 MG PO TABS: 12.5000 mg | ORAL_TABLET | Freq: Every day | ORAL | 3 refills | Status: DC

## 2019-06-11 NOTE — Telephone Encounter (Signed)
Spoke with pt and went over results and recommendations.  Pt verbalized understanding and was in agreement with this plan.  He will have labs drawn 10/8.

## 2019-06-11 NOTE — Telephone Encounter (Signed)
Left message to call back  

## 2019-06-13 DIAGNOSIS — R69 Illness, unspecified: Secondary | ICD-10-CM | POA: Diagnosis not present

## 2019-06-19 DIAGNOSIS — Z794 Long term (current) use of insulin: Secondary | ICD-10-CM | POA: Diagnosis not present

## 2019-06-19 DIAGNOSIS — H4312 Vitreous hemorrhage, left eye: Secondary | ICD-10-CM | POA: Diagnosis not present

## 2019-06-19 DIAGNOSIS — H35352 Cystoid macular degeneration, left eye: Secondary | ICD-10-CM | POA: Diagnosis not present

## 2019-06-19 DIAGNOSIS — E113553 Type 2 diabetes mellitus with stable proliferative diabetic retinopathy, bilateral: Secondary | ICD-10-CM | POA: Diagnosis not present

## 2019-06-19 DIAGNOSIS — H35373 Puckering of macula, bilateral: Secondary | ICD-10-CM | POA: Diagnosis not present

## 2019-06-19 DIAGNOSIS — E113512 Type 2 diabetes mellitus with proliferative diabetic retinopathy with macular edema, left eye: Secondary | ICD-10-CM | POA: Diagnosis not present

## 2019-06-20 ENCOUNTER — Other Ambulatory Visit: Payer: Medicare HMO | Admitting: *Deleted

## 2019-06-20 ENCOUNTER — Other Ambulatory Visit: Payer: Self-pay

## 2019-06-20 DIAGNOSIS — I5032 Chronic diastolic (congestive) heart failure: Secondary | ICD-10-CM | POA: Diagnosis not present

## 2019-06-21 ENCOUNTER — Telehealth: Payer: Self-pay

## 2019-06-21 LAB — BASIC METABOLIC PANEL
BUN/Creatinine Ratio: 17 (ref 10–24)
BUN: 20 mg/dL (ref 8–27)
CO2: 24 mmol/L (ref 20–29)
Calcium: 9.2 mg/dL (ref 8.6–10.2)
Chloride: 100 mmol/L (ref 96–106)
Creatinine, Ser: 1.18 mg/dL (ref 0.76–1.27)
GFR calc Af Amer: 69 mL/min/{1.73_m2} (ref >59)
GFR calc non Af Amer: 60 mL/min/{1.73_m2} (ref >59)
Glucose: 276 mg/dL — ABNORMAL HIGH (ref 65–99)
Potassium: 5.3 mmol/L — ABNORMAL HIGH (ref 3.5–5.2)
Sodium: 137 mmol/L (ref 134–144)

## 2019-06-21 NOTE — Telephone Encounter (Signed)
Unable to LM .[name] Kitchen. voicemail full to inquire if pt has had any changes that would increase his K to 5.3 other than meds and K sparing diuretic that he has been on. Pt had 5.6 K 3 months ago.

## 2019-06-24 NOTE — Progress Notes (Signed)
Cardiology Office Note:    Date:  06/25/2019   ID:  [name], DOB 08-03-1941, MRN [id number]  PCP:  [name], MD  Cardiologist:  [name], MD   Referring MD: [name], MD   Chief Complaint  Patient presents with  . Atrial Fibrillation  . Congestive Heart Failure    History of Present Illness:    [name] is a 78 y.o. male with a hx of  asymptomatic atrial fibrillation, obstructive sleep apnea, hypertension, diabetes,PVD,and hyperlipidemia.  Aldactone was added but potassium continues to remain above 5.2.  He eats a lot of bananas despite our counseling him to decrease high potassium containing foods from his diet.  Adding Aldactone did not help the lower extremity edema.  He is not short of breath however BNP was 1300 suggesting that there is diastolic dysfunction.  Today's EKG demonstrated normal sinus rhythm.  30-day monitor is not yet back.  Past Medical History:  Diagnosis Date  . Anxiety   . Arthritis   . Cancer (HCC)    skin - basil cell  . Depression   . Diabetes mellitus without complication ([place])   . Dysrhythmia    a-fib  . GERD (gastroesophageal reflux disease)   . Hyperlipidemia   . Hypertension   . Neuropathy   . Obesity   . Paroxysmal atrial fibrillation (HCC)   . Peripheral vascular disease ([place])    diabetic neuropathy in both feet  . Sleep apnea    uses C-pap machine  . Stroke [facility])    08/09/2017    Past Surgical History:  Procedure Laterality Date  . APPENDECTOMY  1962  . BACK SURGERY  00-02-12   x3  . BASAL CELL CARCINOMA EXCISION  93/06/10  . COLONOSCOPY    . KNEE ARTHROSCOPY  005/01/02  . 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 TOTAL KNEE ARTHROPLASTY Right 10/08/2018   Procedure: RIGHT TOTAL KNEE ARTHROPLASTY;  Surgeon: [name], MD;  Location: [facility];  Service:  Orthopedics;  Laterality: Right;  51min    Current Medications: Current Meds  Medication Sig  . ALPRAZolam (XANAX) 0.5 MG tablet Take 0.5 mg by mouth 2 (two) times daily.   [name] Kitchen atorvastatin (LIPITOR) 80 MG tablet Take 1 tablet (80 mg total) by mouth daily at 6 PM.  . benazepril (LOTENSIN) 40 MG tablet Take 1 tablet (40 mg total) by mouth daily.  [name] Kitchen ELIQUIS 5 MG TABS tablet TAKE 1 TABLET BY MOUTH TWICE DAILY  . ergocalciferol (VITAMIN D2) 50000 units capsule Take 50,000 Units by mouth once a week.   . furosemide (LASIX) 40 MG tablet Take 1.5 tablets (60 mg total) by mouth daily.  [name] Kitchen gabapentin (NEURONTIN) 300 MG capsule Take 2 capsules (600 mg total) by mouth at bedtime.  . hydroxyurea (HYDREA) 500 MG capsule Take 2 capsules (1,000 mg total) by mouth daily. May take with food to minimize GI side effects.  . insulin aspart (NOVOLOG) 100 UNIT/ML injection Inject 0-15 Units into the skin 3 (three) times daily with meals. Per sliding scale in separate instructions.  . insulin NPH-regular Human (NOVOLIN 70/30) (70-30) 100 UNIT/ML injection Inject 44 Units into the skin 2 (two) times daily with a meal.  . metFORMIN (GLUCOPHAGE) 1000 MG tablet Take 500 mg by mouth 2 (two) times daily.  . metoprolol tartrate (LOPRESSOR) 50 MG tablet Take 50  mg by mouth 2 (two) times daily.  . Multiple Vitamin (MULTIVITAMIN WITH MINERALS) TABS tablet Take 1 tablet by mouth daily.  [name] Kitchen omeprazole (PRILOSEC) 20 MG capsule Take 20 mg by mouth daily.  [name] Kitchen venlafaxine XR (EFFEXOR-XR) 75 MG 24 hr capsule Take 75 mg by mouth daily.  . [DISCONTINUED] furosemide (LASIX) 40 MG tablet Take 1 tablet (40 mg total) by mouth daily.  . [DISCONTINUED] spironolactone (ALDACTONE) 25 MG tablet Take 0.5 tablets (12.5 mg total) by mouth daily.     Allergies:   Patient has no known allergies.   Social History   Socioeconomic History  . Marital status: Married    Spouse name: Not on file  . Number of children: Not on file  . Years of  education: Not on file  . Highest education level: Not on file  Occupational History  . Not on file  Social Needs  . Financial resource strain: Not on file  . Food insecurity    Worry: Not on file    Inability: Not on file  . Transportation needs    Medical: Not on file    Non-medical: Not on file  Tobacco Use  . Smoking status: Former Smoker    Packs/day: 1.00    Years: 30.00    Pack years: 30.00    Types: Cigars    Quit date: 08/02/1992    Years since quitting: 26.9  . Smokeless tobacco: Never Used  Substance and Sexual Activity  . Alcohol use: Yes    Alcohol/week: 0.0 standard drinks    Comment: 1 beer a night.   . Drug use: No  . Sexual activity: Not on file  Lifestyle  . Physical activity    Days per week: Not on file    Minutes per session: Not on file  . Stress: Not on file  Relationships  . Social Herbalist on phone: Not on file    Gets together: Not on file    Attends religious service: Not on file    Active member of club or organization: Not on file    Attends meetings of clubs or organizations: Not on file    Relationship status: Not on file  Other Topics Concern  . Not on file  Social History Narrative  . Not on file     Family History: The patient's family history includes CVA in his father; Cancer in his mother; Diabetes Mellitus II in his mother; Heart failure in his father; Hypertension in his mother and sister. There is no history of Colon cancer.  ROS:   Please see the history of present illness.    He did have a fall recently and hurt his rib in the right lower sternal border.  It is exquisitely tender and there is also some fullness to palpation.  He otherwise feels okay.  All other systems reviewed and are negative.  EKGs/Labs/Other Studies Reviewed:    The following studies were reviewed today: None  EKG:  EKG sinus bradycardia 59 bpm otherwise normal.  PR interval is normal.  Recent Labs: 10/13/2018: B Natriuretic Peptide  72.9; Magnesium 1.9; TSH 2.881 06/03/2019: ALT 31; Hemoglobin 11.5; NT-Pro BNP 1,269; Platelets 686 06/20/2019: BUN 20; Creatinine, Ser 1.18; Potassium 5.3; Sodium 137  Recent Lipid Panel No results found for: CHOL, TRIG, HDL, CHOLHDL, VLDL, LDLCALC, LDLDIRECT  Physical Exam:    VS:  BP 121/67   Pulse (!) 59   Ht 6' (1.829 m)   Wt 293 lb 12.8  oz (133.3 kg)   SpO2 96%   BMI 39.85 kg/m     Wt Readings from Last 3 Encounters:  06/25/19 293 lb 12.8 oz (133.3 kg)  05/22/19 290 lb 9.6 oz (131.8 kg)  10/13/18 279 lb 15.8 oz (127 kg)     GEN: Obese. No acute distress HEENT: Normal NECK: No JVD. LYMPHATICS: No lymphadenopathy CARDIAC:  RRR without murmur, gallop, but there is bilateral symmetric edema from the ankles to the mid shin.  VASCULAR:  Normal Pulses. No bruits. RESPIRATORY: Very tender right lower chest wall and particularly over the last right rib.  No ecchymosis is noted.  There is also fullness suggesting hematoma is present.  Clear to auscultation without rales, wheezing or rhonchi.  There is no dullness to percussion but clinical evidence of pleural effusion. ABDOMEN: Soft, non-tender, non-distended, No pulsatile mass, MUSCULOSKELETAL: No deformity  SKIN: Warm and dry NEUROLOGIC:  Alert and oriented x 3 PSYCHIATRIC:  Normal affect   ASSESSMENT:    1. Chronic diastolic CHF (congestive heart failure) ([place])   2. Tachycardia-bradycardia syndrome ([place])   3. Long term current use of anticoagulant therapy   4. Diabetes mellitus type 2, insulin dependent ([place])   5. Essential thrombocytosis (HCC)   6. Chronic anticoagulation   7. Educated about COVID-19 virus infection    PLAN:    In order of problems listed above:  1. This is a presumed diagnosis.  BNP was greater than 1300 and in addition to lower extremity edema clinical diagnosis was heart failure.  Increase furosemide to 60 mg/day.  Discontinue spironolactone because of hyperkalemia.  Comprehensive metabolic panel  will be done in 7 to 10 days rule out dehydration, check potassium, and check albumin as a potential explanation for edema.  Edema may have components of venous insufficiency or hypoalbuminemia related.  Sleep apnea is also a consideration 2. Awaiting 30-day monitor. 3. Continue current therapy. 4. Poor control with A1c greater than 7 5. Not discussed 6. Continue anticoagulation and notify us of bleeding. 7. 3 W's are discussed and endorsed  Clinical follow-up in 6 to 8 weeks or earlier depending upon response to diuretic therapy.  If the right rib area continues to give him trouble he will need to have x-rays performed.  When he fell he did not hit his head.  Medication Adjustments/Labs and Tests Ordered: Current medicines are reviewed at length with the patient today.  Concerns regarding medicines are outlined above.  Orders Placed This Encounter  Procedures  . Basic metabolic panel  . Hepatic function panel  . EKG 12-Lead   Meds ordered this encounter  Medications  . furosemide (LASIX) 40 MG tablet    Sig: Take 1.5 tablets (60 mg total) by mouth daily.    Dispense:  90 tablet    Refill:  3    Dose change    Patient Instructions  Medication Instructions:  1) DISCONTINUE Spironolactone 2) INCREASE Furosemide to 60mg  once daily  If you need a refill on your cardiac medications before your next appointment, please call your pharmacy.   Lab work: Your physician recommends that you return for lab work in: 7-10 days (BMET, Liver)  If you have labs (blood work) drawn today and your tests are completely normal, you will receive your results only by: [name] Kitchen MyChart Message (if you have MyChart) OR . A paper copy in the mail If you have any lab test that is abnormal or we need to change your treatment, we will call you to  review the results.  Testing/Procedures: None  Follow-Up: At [facility], you and your health needs are our priority.  As part of our continuing mission to  provide you with exceptional heart care, we have created designated Provider Care Teams.  These Care Teams include your primary Cardiologist (physician) and Advanced Practice Providers (APPs -  Physician Assistants and Nurse Practitioners) who all work together to provide you with the care you need, when you need it. You will need a follow up appointment in 6 weeks.  Please call our office 2 months in advance to schedule this appointment.  You may see [name], MD or one of the following Advanced Practice Providers on your designated Care Team:   Truitt Merle, NP Cecilie Kicks, NP . Kathyrn Drown, NP  Any Other Special Instructions Will Be Listed Below (If Applicable).  Your physician recommends that you weigh daily under the same circumstances and call with those readings in 5-6 days.        Signed, [name], MD  06/25/2019 5:41 PM    [place]

## 2019-06-25 ENCOUNTER — Other Ambulatory Visit: Payer: Self-pay

## 2019-06-25 ENCOUNTER — Encounter: Payer: Self-pay | Admitting: Interventional Cardiology

## 2019-06-25 ENCOUNTER — Ambulatory Visit: Payer: Medicare HMO | Admitting: Interventional Cardiology

## 2019-06-25 VITALS — BP 121/67 | HR 59 | Ht 72.0 in | Wt 293.8 lb

## 2019-06-25 DIAGNOSIS — Z7901 Long term (current) use of anticoagulants: Secondary | ICD-10-CM | POA: Diagnosis not present

## 2019-06-25 DIAGNOSIS — Z794 Long term (current) use of insulin: Secondary | ICD-10-CM | POA: Diagnosis not present

## 2019-06-25 DIAGNOSIS — I495 Sick sinus syndrome: Secondary | ICD-10-CM

## 2019-06-25 DIAGNOSIS — E119 Type 2 diabetes mellitus without complications: Secondary | ICD-10-CM | POA: Diagnosis not present

## 2019-06-25 DIAGNOSIS — D473 Essential (hemorrhagic) thrombocythemia: Secondary | ICD-10-CM | POA: Diagnosis not present

## 2019-06-25 DIAGNOSIS — Z7189 Other specified counseling: Secondary | ICD-10-CM | POA: Diagnosis not present

## 2019-06-25 DIAGNOSIS — I5032 Chronic diastolic (congestive) heart failure: Secondary | ICD-10-CM | POA: Diagnosis not present

## 2019-06-25 MED ORDER — FUROSEMIDE 40 MG PO TABS: 60.0000 mg | ORAL_TABLET | Freq: Every day | ORAL | 3 refills | Status: DC

## 2019-06-25 NOTE — Patient Instructions (Signed)
Medication Instructions:  1) DISCONTINUE Spironolactone 2) INCREASE Furosemide to 60mg  once daily  If you need a refill on your cardiac medications before your next appointment, please call your pharmacy.   Lab work: Your physician recommends that you return for lab work in: 7-10 days (BMET, Liver)  If you have labs (blood work) drawn today and your tests are completely normal, you will receive your results only by: [name] Kitchen MyChart Message (if you have MyChart) OR . A paper copy in the mail If you have any lab test that is abnormal or we need to change your treatment, we will call you to review the results.  Testing/Procedures: None  Follow-Up: At [facility], you and your health needs are our priority.  As part of our continuing mission to provide you with exceptional heart care, we have created designated Provider Care Teams.  These Care Teams include your primary Cardiologist (physician) and Advanced Practice Providers (APPs -  Physician Assistants and Nurse Practitioners) who all work together to provide you with the care you need, when you need it. You will need a follow up appointment in 6 weeks.  Please call our office 2 months in advance to schedule this appointment.  You may see [name], MD or one of the following Advanced Practice Providers on your designated Care Team:   Truitt Merle, NP Cecilie Kicks, NP . Kathyrn Drown, NP  Any Other Special Instructions Will Be Listed Below (If Applicable).  Your physician recommends that you weigh daily under the same circumstances and call with those readings in 5-6 days.

## 2019-06-25 NOTE — Progress Notes (Signed)
[place]  [phone] [phone]     ID: [name] DOB: January 05, 1941  MR#: [id number]  WGN#:[id number]  Patient Care Team: Charles Bowen, MD as PCP - General (Endocrinology) Charles Crome, MD as PCP - Cardiology (Cardiology) [name], Charles Dad, MD as Consulting Physician (Oncology) Charles Shipper, MD as Consulting Physician (Gastroenterology) Charles Monarch, MD as Consulting Physician (Dermatology) Charles Cluck, MD as Referring Physician (Ophthalmology) Charles Fila, MD as Consulting Physician (Neurology) Charles Cruel, MD OTHER MD:  CHIEF COMPLAINT: Essential thrombocytosis  CURRENT TREATMENT:  hydroxyurea   HISTORY OF CURRENT ILLNESS: From the original intake note:  Dr. Forde Hall obtained lab work on [name] 09/25/2017 showing a platelet count of 1,035,000.  He referred him at that point for further evaluation and sent [place] his prior lab work.  The earliest platelet count I have is from 03/18/2013.  It was normal then at 263,000.  On 07/12/2015 the platelet count was 559,000.  On 08/10/2016 the platelet count was 725,000.  On 08/09/2017 the platelet count was 881,000  Prior to today's visit I se upt [name] for some additional lab work.  This was performed 09/29/2017.  On that date his white cell count was 10.4, with the absolute neutrophil count 8.0.  Hemoglobin was 14.0 with an MCV of 86.7.  The platelet count was 895,000  The labs obtained on that date included a ferritin of 103, iron saturation of 76%, C-reactive protein less than 0.8, LDH 209 and BCR/ABL not detected.  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  The patient's subsequent history is as detailed below.   INTERVAL HISTORY: [name] returns today for follow-up and treatment of his essential thrombocytosis. He was last seen here on 07/18/2018.    He continues on hydroxyurea.  He tolerates this with no side effects that he is aware of.  It is not financially a burden to him.  Since his last visit here, he underwent a total knee arthroplasty on 10/08/2018.  He tells me that his rehab was interrupted partly because of the virus and partly because when he was at home he just did not do any exercises.  He later picked up a couple of months of rehab at [name] office.  He still has significant problems walking however.  He says that after the surgery he developed significant floaters.  His vision in the left eye in particular is very blurred.  He is seeing [name] ophthalmology had some laser to the right eye but has developed some blood on the vitreous on the left and further treatments have been postponed.  He underwent a chest xray for confusion, hyperglycemia, congestion, cough, and elevated heart rate on 10/13/2018 showing: Mild cardiac enlargement. No evidence of active pulmonary disease.  He also underwent a head MRI on 10/14/2018 showing:No acute intracranial process. 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 Moderate chronic small vessel ischemic changes. Moderate to severe parenchymal brain volume loss.   REVIEW OF SYSTEMS: [name] is currently not exercising.  He cannot use his wife's treadmill.  [facility].  He has had no intercurrent fevers drenching sweats or significant change overall but he tells me he is being evaluated for cardiac arrhythmia, that his kidneys are not  working well, and that he continues to gain weight.  At home he mostly works on the computer.  He had a fall this past weekend and may have injured his right ribs.  Fortunately he did not hit his head.  He does have a cane but he does not regularly use it.  Detailed review of systems today was otherwise stable     PAST MEDICAL HISTORY: Past Medical History:  Diagnosis Date  . Anxiety   . Arthritis   . Cancer (HCC)    skin - basil cell  . Depression   . Diabetes mellitus without complication ([place])   . Dysrhythmia    a-fib  . GERD (gastroesophageal reflux disease)   . Hyperlipidemia   . Hypertension   . Neuropathy   . Obesity   . Paroxysmal atrial fibrillation (HCC)   . Peripheral vascular disease ([place])    diabetic neuropathy in both feet  . Sleep apnea    uses C-pap machine  . Stroke [facility])    08/09/2017    PAST SURGICAL HISTORY: Past Surgical History:  Procedure Laterality Date  . APPENDECTOMY  1962  . BACK SURGERY  00-02-12   x3  . BASAL CELL CARCINOMA EXCISION  93/06/10  . COLONOSCOPY    . KNEE ARTHROSCOPY  005/01/02  . 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   Procedure: TOTAL LEFT KNEE ARTHROPLASTY;  Surgeon: Charles Arabian, MD;  Location: [facility];  Service: Orthopedics;  Laterality: Left;  . TOTAL KNEE ARTHROPLASTY Right 10/08/2018   Procedure: RIGHT TOTAL KNEE ARTHROPLASTY;  Surgeon: Charles Arabian, MD;  Location: [facility];  Service: Orthopedics;  Laterality: Right;  27mn    FAMILY HISTORY Family History  Problem Relation Age of Onset  . Cancer Mother   . Diabetes Mellitus II Mother   . Hypertension Mother   . Heart failure Father   . CVA Father   . Hypertension Sister   . Colon cancer Neg Hx   Patient's father died at age 1770shortly following a CABG.  The patient's mother died at age 1751from cancer but the patient does not know what type.  The patient has 3 brothers, 1 sister.  The sister died at age 5549following a stroke.  One brother had heart disease but is now doing well.  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  He has 2 children from his first marriage, [name] who is a GMusic therapistand a med tDesigner, multimediain TMonticello 52, and [name] who works for the [employer]   The patient has 4 biological grandchildren.  His second wife, [name] has 2 children of her own, SHilliard Clark 315 who is in [place]and works as a pHigher education careers adviserat [employer], who is a pManagement consultantin [employer]  The patient is not a church attender    ADVANCED DIRECTIVES:    HEALTH MAINTENANCE: Social History   Tobacco Use  . Smoking status: Former Smoker    Packs/day: 1.00    Years: 30.00    Pack years: 30.00    Types: Cigars    Quit date: 08/02/1992    Years since quitting: 26.9  . Smokeless tobacco: Never Used  Substance Use Topics  . Alcohol use: Yes    Alcohol/week: 0.0 standard drinks    Comment: 1 beer a night.   . Drug use: No     Colonoscopy: 05/12/2015  Bone density:  PSA:   No Known Allergies  Current Outpatient Medications  Medication Sig Dispense Refill  . ALPRAZolam (XANAX) 0.5 MG tablet Take 0.5 mg by mouth 2 (two) times daily.     [name] Kitchen atorvastatin (LIPITOR) 80 MG tablet Take 1 tablet (80 mg total) by mouth daily at 6 PM. 30 tablet 0  . benazepril (LOTENSIN) 40 MG tablet Take 1 tablet (40 mg total) by mouth daily.    [name] Kitchen ELIQUIS 5 MG TABS tablet TAKE 1 TABLET BY MOUTH TWICE DAILY 60 tablet 9  . ergocalciferol (VITAMIN D2) 50000 units capsule Take 50,000 Units by mouth once a week.     . furosemide (LASIX) 40 MG tablet Take 1.5 tablets (60 mg total) by mouth daily. 90 tablet 3  . gabapentin (NEURONTIN) 300 MG capsule Take 2 capsules (600 mg total) by mouth at bedtime. 180 capsule 3  . hydroxyurea (HYDREA) 500 MG capsule Take 2 capsules (1,000 mg total) by mouth daily. May take with food to minimize GI side effects. 180 capsule 12  . insulin aspart (NOVOLOG) 100 UNIT/ML injection Inject 0-15 Units into the skin 3 (three) times daily with meals. Per sliding scale in separate instructions. 6 mL 0  . insulin NPH-regular Human (NOVOLIN 70/30) (70-30) 100 UNIT/ML injection Inject 44 Units into the skin 2 (two) times daily with a meal.    . metFORMIN (GLUCOPHAGE) 1000  MG tablet Take 500 mg by mouth 2 (two) times daily.    . metoprolol tartrate (LOPRESSOR) 50 MG tablet Take 50 mg by mouth 2 (two) times daily.    . Multiple Vitamin (MULTIVITAMIN WITH MINERALS) TABS tablet Take 1 tablet by mouth daily.    [name] Kitchen omeprazole (PRILOSEC) 20 MG capsule Take 20 mg by mouth daily.    [name] Kitchen venlafaxine XR (EFFEXOR-XR) 75 MG 24 hr capsule Take 75 mg by mouth daily.     No current facility-administered medications for this visit.     OBJECTIVE: Morbidly obese white man who appears stated age  9:   06/26/19 1222  BP: (!) 139/47  Pulse: 61  Resp: 18  Temp: 98 F (36.7 C)  SpO2: 97%   Wt Readings from Last 3 Encounters:  06/26/19 294 lb 12.8 oz (133.7 kg)  06/25/19 293 lb 12.8 oz (133.3 kg)  05/22/19 290 lb 9.6 oz (131.8 kg)   Body mass index is 39.98 kg/m.    ECOG FS:2 - Symptomatic, <50% confined to bed  Ocular: Sclerae unicteric, pupils round and equal Ear-nose-throat: Wearing a mask Lymphatic: No cervical or supraclavicular adenopathy Lungs no rales or rhonchi Heart regular rate and rhythm Abd soft, nontender, positive bowel sounds, no splenomegaly MSK no focal spinal tenderness Neuro: non-focal, well-oriented, appropriate affect    LAB RESULTS:  CMP     Component Value Date/Time   NA 143 06/26/2019 1206   NA 137 06/20/2019 1541   K 4.8 06/26/2019 1206   CL 104 06/26/2019 1206   CO2 30 06/26/2019 1206   GLUCOSE 130 (H) 06/26/2019 1206   BUN 20 06/26/2019 1206   BUN 20 06/20/2019 1541   CREATININE 1.02 06/26/2019 1206   CREATININE 0.82 09/26/2018 1148   CALCIUM 9.2 06/26/2019 1206   PROT 6.7 06/26/2019 1206   PROT 6.5 06/03/2019 1135   ALBUMIN 3.6 06/26/2019 1206   ALBUMIN 3.8 06/03/2019 1135   AST 21 06/26/2019 1206   AST 19 09/26/2018 1148   ALT 25 06/26/2019 1206   ALT 29 09/26/2018 1148   ALKPHOS 112 06/26/2019 1206  BILITOT 0.8 06/26/2019 1206   BILITOT 0.4 06/03/2019 1135   BILITOT 1.0 09/26/2018 1148   GFRNONAA >60  06/26/2019 1206   GFRNONAA >60 09/26/2018 1148   GFRAA >60 06/26/2019 1206   GFRAA >60 09/26/2018 1148    No results found for: TOTALPROTELP, ALBUMINELP, A1GS, A2GS, BETS, BETA2SER, GAMS, MSPIKE, SPEI  No results found for: KPAFRELGTCHN, LAMBDASER, KAPLAMBRATIO  Lab Results  Component Value Date   WBC 7.0 06/26/2019   NEUTROABS 4.6 06/26/2019   HGB 11.5 (L) 06/26/2019   HCT 34.6 (L) 06/26/2019   MCV 99.7 06/26/2019   PLT 615 (H) 06/26/2019    _0 @  No results found for: LABCA2  No components found for: TOIZTI458  No results for input(s): INR in the last 168 hours.  No results found for: LABCA2  No results found for: KDX833  No results found for: ASN053  No results found for: ZJQ734  No results found for: CA2729  No components found for: HGQUANT  No results found for: CEA1 / No results found for: CEA1   No results found for: AFPTUMOR  No results found for: CHROMOGRNA  No results found for: PSA1  Appointment on 06/26/2019  Component Date Value Ref Range Status  . Smear Review 06/26/2019 SMEAR STAINED AND AVAILABLE FOR REVIEW   Final   Performed at [facility], [address], [place], [place] [address]  . WBC 06/26/2019 7.0  4.0 - 10.5 K/uL Final  . RBC 06/26/2019 3.47* 4.22 - 5.81 MIL/uL Final  . Hemoglobin 06/26/2019 11.5* 13.0 - 17.0 g/dL Final  . HCT 06/26/2019 34.6* 39.0 - 52.0 % Final  . MCV 06/26/2019 99.7  80.0 - 100.0 fL Final  . MCH 06/26/2019 33.1  26.0 - 34.0 pg Final  . MCHC 06/26/2019 33.2  30.0 - 36.0 g/dL Final  . RDW 06/26/2019 18.9* 11.5 - 15.5 % Final  . Platelets 06/26/2019 615* 150 - 400 K/uL Final  . nRBC 06/26/2019 0.3* 0.0 - 0.2 % Final  . Neutrophils Relative % 06/26/2019 67  % Final  . Neutro Abs 06/26/2019 4.6  1.7 - 7.7 K/uL Final  . Lymphocytes Relative 06/26/2019 21  % Final  . Lymphs Abs 06/26/2019 1.5  0.7 - 4.0 K/uL Final  . Monocytes Relative 06/26/2019 8  % Final  . Monocytes  Absolute 06/26/2019 0.6  0.1 - 1.0 K/uL Final  . Eosinophils Relative 06/26/2019 2  % Final  . Eosinophils Absolute 06/26/2019 0.1  0.0 - 0.5 K/uL Final  . Basophils Relative 06/26/2019 2  % Final  . Basophils Absolute 06/26/2019 0.1  0.0 - 0.1 K/uL Final  . Immature Granulocytes 06/26/2019 0  % Final  . Abs Immature Granulocytes 06/26/2019 0.03  0.00 - 0.07 K/uL Final   Performed at [facility], [place] [address] [place], [place] [address]  . Sodium 06/26/2019 143  135 - 145 mmol/L Final  . Potassium 06/26/2019 4.8  3.5 - 5.1 mmol/L Final  . Chloride 06/26/2019 104  98 - 111 mmol/L Final  . CO2 06/26/2019 30  22 - 32 mmol/L Final  . Glucose, Bld 06/26/2019 130* 70 - 99 mg/dL Final  . BUN 06/26/2019 20  8 - 23 mg/dL Final  . Creatinine, Ser 06/26/2019 1.02  0.61 - 1.24 mg/dL Final  . Calcium 06/26/2019 9.2  8.9 - 10.3 mg/dL Final  . Total Protein 06/26/2019 6.7  6.5 - 8.1 g/dL Final  . Albumin 06/26/2019 3.6  3.5 - 5.0 g/dL  Final  . AST 06/26/2019 21  15 - 41 U/L Final  . ALT 06/26/2019 25  0 - 44 U/L Final  . Alkaline Phosphatase 06/26/2019 112  38 - 126 U/L Final  . Total Bilirubin 06/26/2019 0.8  0.3 - 1.2 mg/dL Final  . GFR calc non Af Amer 06/26/2019 >60  >60 mL/min Final  . GFR calc Af Amer 06/26/2019 >60  >60 mL/min Final  . Anion gap 06/26/2019 9  5 - 15 Final   Performed at [facility], [place] [address], [place], [place] [address]    (this displays the last labs from the last 3 days)  No results found for: TOTALPROTELP, ALBUMINELP, A1GS, A2GS, BETS, BETA2SER, GAMS, MSPIKE, SPEI (this displays SPEP labs)  No results found for: KPAFRELGTCHN, LAMBDASER, KAPLAMBRATIO (kappa/lambda light chains)  No results found for: HGBA, HGBA2QUANT, HGBFQUANT, HGBSQUAN (Hemoglobinopathy evaluation)   Lab Results  Component Value Date   LDH 209 09/29/2017    Lab Results  Component Value Date   IRON 166 (H) 09/29/2017   TIBC 219  09/29/2017   IRONPCTSAT 76 09/29/2017   (Iron and TIBC)  Lab Results  Component Value Date   FERRITIN 143 09/29/2017    Urinalysis    Component Value Date/Time   COLORURINE YELLOW 10/13/2018 2034   APPEARANCEUR CLEAR 10/13/2018 2034   LABSPEC 1.012 10/13/2018 2034   PHURINE 7.0 10/13/2018 2034   GLUCOSEU >=500 (A) 10/13/2018 2034   [place] 10/13/2018 2034   [place] NEGATIVE 10/13/2018 2034   [place] 10/13/2018 2034   PROTEINUR NEGATIVE 10/13/2018 2034   NITRITE NEGATIVE 10/13/2018 2034   LEUKOCYTESUR NEGATIVE 10/13/2018 2034     STUDIES: No results found.   REVIEW OF BLOOD FILM: Blood film from 09/29/2017, there is no significant red cell abnormality, and particularly no poikilocytosis or nucleated red cells.  There are no platelet clumps and no giant platelets.  There is minimal left shift in the white cell series, with a few bands.    ELIGIBLE FOR AVAILABLE RESEARCH PROTOCOL: no  ASSESSMENT: 78 y.o. [place] Man with a platelet count in excess of 1 million 09/25/2017, a positive Jak2 V617F mutation, and normal iron studies, normal C-reactive protein, and negative BCR-ABL by PCR  (1) essential thrombocytosis: Started on Hydrea 1000 mg a day 10/06/2017  (a) Hydrea dose increased to 1500 mg daily beginning 06/27/2019  (2) bone marrow biopsy discussed, postponed  (3) 0.7 cm right upper lung nodule incidentally noted on neck CT scans November 2018  (a) no change on repeat CT of the chest with contrast 02/28/2018  PLAN: [name] is stable as far as his essential thrombocytosis is concerned.  He assures me he has been taking his Hydrea.  His platelet count is not quite where we would like it and I am increasing his dose to 1500 mg daily.  I am changing the prescription accordingly.  He understands this may drop his white cell count and he already has labs scheduled by Dr. Tamala Julian 07/02/2019.  I have added a CBC to that.  I also wrote him a prescription  to get labs either at Dr. Thompson Caul or at [facility] in 4 to 6 weeks just to make sure everything remains stable.  Assuming that is the case he will return to see me in 1 year.  I will continue to follow labs peripherally as he has frequent lab work through his other doctors.  I reviewed fall precautions with him.  He understands being on apixaban  if he hits his head he is at high risk of significant intracranial bleeding complications  He knows to call for any other issue that may develop before the next visit.  [name], Charles Dad, MD  06/26/19 12:52 PM Medical Oncology and Hematology [facility] [place], [place] [address] Tel. [phone]    Fax. [phone]  I, Jacqualyn Posey am acting as a Education administrator for Charles Cruel, MD.   I, Lurline Del MD, have reviewed the above documentation for accuracy and completeness, and I agree with the above.

## 2019-06-26 ENCOUNTER — Other Ambulatory Visit: Payer: Self-pay

## 2019-06-26 ENCOUNTER — Inpatient Hospital Stay: Payer: Medicare HMO | Attending: Oncology

## 2019-06-26 ENCOUNTER — Inpatient Hospital Stay (HOSPITAL_BASED_OUTPATIENT_CLINIC_OR_DEPARTMENT_OTHER): Payer: Medicare HMO | Admitting: Oncology

## 2019-06-26 VITALS — BP 139/47 | HR 61 | Temp 98.0°F | Resp 18 | Ht 72.0 in | Wt 294.8 lb

## 2019-06-26 DIAGNOSIS — E08311 Diabetes mellitus due to underlying condition with unspecified diabetic retinopathy with macular edema: Secondary | ICD-10-CM

## 2019-06-26 DIAGNOSIS — I480 Paroxysmal atrial fibrillation: Secondary | ICD-10-CM

## 2019-06-26 DIAGNOSIS — I63512 Cerebral infarction due to unspecified occlusion or stenosis of left middle cerebral artery: Secondary | ICD-10-CM

## 2019-06-26 DIAGNOSIS — D473 Essential (hemorrhagic) thrombocythemia: Secondary | ICD-10-CM

## 2019-06-26 DIAGNOSIS — I5032 Chronic diastolic (congestive) heart failure: Secondary | ICD-10-CM

## 2019-06-26 DIAGNOSIS — E0842 Diabetes mellitus due to underlying condition with diabetic polyneuropathy: Secondary | ICD-10-CM

## 2019-06-26 DIAGNOSIS — E119 Type 2 diabetes mellitus without complications: Secondary | ICD-10-CM

## 2019-06-26 DIAGNOSIS — I158 Other secondary hypertension: Secondary | ICD-10-CM

## 2019-06-26 DIAGNOSIS — I495 Sick sinus syndrome: Secondary | ICD-10-CM

## 2019-06-26 DIAGNOSIS — D689 Coagulation defect, unspecified: Secondary | ICD-10-CM

## 2019-06-26 DIAGNOSIS — D75839 Thrombocytosis, unspecified: Secondary | ICD-10-CM

## 2019-06-26 LAB — COMPREHENSIVE METABOLIC PANEL
ALT: 25 U/L (ref 0–44)
AST: 21 U/L (ref 15–41)
Albumin: 3.6 g/dL (ref 3.5–5.0)
Alkaline Phosphatase: 112 U/L (ref 38–126)
Anion gap: 9 (ref 5–15)
BUN: 20 mg/dL (ref 8–23)
CO2: 30 mmol/L (ref 22–32)
Calcium: 9.2 mg/dL (ref 8.9–10.3)
Chloride: 104 mmol/L (ref 98–111)
Creatinine, Ser: 1.02 mg/dL (ref 0.61–1.24)
GFR calc Af Amer: >60 mL/min (ref >60)
GFR calc non Af Amer: >60 mL/min (ref >60)
Glucose, Bld: 130 mg/dL — ABNORMAL HIGH (ref 70–99)
Potassium: 4.8 mmol/L (ref 3.5–5.1)
Sodium: 143 mmol/L (ref 135–145)
Total Bilirubin: 0.8 mg/dL (ref 0.3–1.2)
Total Protein: 6.7 g/dL (ref 6.5–8.1)

## 2019-06-26 LAB — CBC WITH DIFFERENTIAL/PLATELET
Abs Immature Granulocytes: 0.03 10*3/uL (ref 0.00–0.07)
Basophils Absolute: 0.1 10*3/uL (ref 0.0–0.1)
Basophils Relative: 2 %
Eosinophils Absolute: 0.1 10*3/uL (ref 0.0–0.5)
Eosinophils Relative: 2 %
HCT: 34.6 % — ABNORMAL LOW (ref 39.0–52.0)
Hemoglobin: 11.5 g/dL — ABNORMAL LOW (ref 13.0–17.0)
Immature Granulocytes: 0 %
Lymphocytes Relative: 21 %
Lymphs Abs: 1.5 10*3/uL (ref 0.7–4.0)
MCH: 33.1 pg (ref 26.0–34.0)
MCHC: 33.2 g/dL (ref 30.0–36.0)
MCV: 99.7 fL (ref 80.0–100.0)
Monocytes Absolute: 0.6 10*3/uL (ref 0.1–1.0)
Monocytes Relative: 8 %
Neutro Abs: 4.6 10*3/uL (ref 1.7–7.7)
Neutrophils Relative %: 67 %
Platelets: 615 10*3/uL — ABNORMAL HIGH (ref 150–400)
RBC: 3.47 MIL/uL — ABNORMAL LOW (ref 4.22–5.81)
RDW: 18.9 % — ABNORMAL HIGH (ref 11.5–15.5)
WBC: 7 10*3/uL (ref 4.0–10.5)
nRBC: 0.3 % — ABNORMAL HIGH (ref 0.0–0.2)

## 2019-06-26 LAB — SAVE SMEAR(SSMR), FOR PROVIDER SLIDE REVIEW

## 2019-06-26 MED ORDER — HYDROXYUREA 500 MG PO CAPS: 1500.0000 mg | ORAL_CAPSULE | Freq: Every day | ORAL | 3 refills | Status: DC

## 2019-06-27 ENCOUNTER — Telehealth: Payer: Self-pay | Admitting: Oncology

## 2019-06-27 NOTE — Telephone Encounter (Signed)
I left a message regarding schedule  

## 2019-06-28 ENCOUNTER — Telehealth: Payer: Self-pay | Admitting: Interventional Cardiology

## 2019-06-28 NOTE — Telephone Encounter (Signed)
Pt called back and scheduled labs for 10/22.

## 2019-06-28 NOTE — Telephone Encounter (Signed)
Pt in 10/13 and Dr. Tamala Julian ordered labs.  Labs not scheduled.  Called pt and left message to call back and schedule BMET and Liver for sometime between 10/20-10/23.

## 2019-07-04 ENCOUNTER — Other Ambulatory Visit: Payer: Medicare HMO | Admitting: *Deleted

## 2019-07-04 ENCOUNTER — Other Ambulatory Visit: Payer: Self-pay

## 2019-07-04 DIAGNOSIS — I5032 Chronic diastolic (congestive) heart failure: Secondary | ICD-10-CM | POA: Diagnosis not present

## 2019-07-05 DIAGNOSIS — I495 Sick sinus syndrome: Secondary | ICD-10-CM | POA: Diagnosis not present

## 2019-07-05 LAB — HEPATIC FUNCTION PANEL
ALT: 27 IU/L (ref 0–44)
AST: 20 IU/L (ref 0–40)
Albumin: 3.7 g/dL (ref 3.7–4.7)
Alkaline Phosphatase: 147 IU/L — ABNORMAL HIGH (ref 39–117)
Bilirubin Total: 0.4 mg/dL (ref 0.0–1.2)
Bilirubin, Direct: 0.15 mg/dL (ref 0.00–0.40)
Total Protein: 6.2 g/dL (ref 6.0–8.5)

## 2019-07-05 LAB — BASIC METABOLIC PANEL
BUN/Creatinine Ratio: 19 (ref 10–24)
BUN: 17 mg/dL (ref 8–27)
CO2: 25 mmol/L (ref 20–29)
Calcium: 9.1 mg/dL (ref 8.6–10.2)
Chloride: 101 mmol/L (ref 96–106)
Creatinine, Ser: 0.91 mg/dL (ref 0.76–1.27)
GFR calc Af Amer: 95 mL/min/{1.73_m2} (ref >59)
GFR calc non Af Amer: 82 mL/min/{1.73_m2} (ref >59)
Glucose: 237 mg/dL — ABNORMAL HIGH (ref 65–99)
Potassium: 4.9 mmol/L (ref 3.5–5.2)
Sodium: 137 mmol/L (ref 134–144)

## 2019-07-17 DIAGNOSIS — H43813 Vitreous degeneration, bilateral: Secondary | ICD-10-CM | POA: Diagnosis not present

## 2019-07-17 DIAGNOSIS — Z794 Long term (current) use of insulin: Secondary | ICD-10-CM | POA: Diagnosis not present

## 2019-07-17 DIAGNOSIS — H2513 Age-related nuclear cataract, bilateral: Secondary | ICD-10-CM | POA: Diagnosis not present

## 2019-07-17 DIAGNOSIS — H35373 Puckering of macula, bilateral: Secondary | ICD-10-CM | POA: Diagnosis not present

## 2019-07-17 DIAGNOSIS — E113553 Type 2 diabetes mellitus with stable proliferative diabetic retinopathy, bilateral: Secondary | ICD-10-CM | POA: Diagnosis not present

## 2019-07-17 DIAGNOSIS — H4312 Vitreous hemorrhage, left eye: Secondary | ICD-10-CM | POA: Diagnosis not present

## 2019-07-25 NOTE — Progress Notes (Addendum)
CARDIOLOGY OFFICE NOTE  Date:  07/31/2019    [name] Date of Birth: [birth year] Medical Record [id number]  PCP:  [name], MD  Cardiologist:  Tamala Julian    Chief Complaint  Patient presents with  . Follow-up    History of Present Illness: [name] is a 78 y.o. male who presents today for a one month check. Seen for Dr. Tamala Julian.   He has a history of asymptomatic atrial fibrillation, obstructive sleep apnea, hypertension, diabetes,PVD,and hyperlipidemia.Was not able to keep on Aldactone due to hyperkalemia and it did not help with edema.   Seen here last month by Dr. Tamala Julian - has had more issues with swelling. Had a monitor in place. Lasix was increased for his swelling. There was mention of a fall. After that visit the monitor was completed - concern for tachy brady and noted 34% AF burden and he is to be referred to EP.   The patient does not have symptoms concerning for COVID-19 infection (fever, chills, cough, or new shortness of breath).   Comes in today. Here alone. He is frustrated with his swelling. He sits most of the day with his legs down. Probably gets too much salt as well. He notes that if he props his legs up on a pillow - his swelling does improve. He is not really short of breath. He has had no syncope. He had a fall a while back - clearly did not pass out - missed the curb instead. Not dizzy. No chest pain. Lots of neuropathy in his feet. He has had both knees replaced - very limited mobility and he has gained "the COVID 15".   Past Medical History:  Diagnosis Date  . Anxiety   . Arthritis   . Cancer (HCC)    skin - basil cell  . Depression   . Diabetes mellitus without complication ([place])   . Dysrhythmia    a-fib  . GERD (gastroesophageal reflux disease)   . Hyperlipidemia   . Hypertension   . Neuropathy   . Obesity   . Paroxysmal atrial fibrillation (HCC)   . Peripheral vascular disease ([place])    diabetic neuropathy in both feet  .  Sleep apnea    uses C-pap machine  . Stroke [facility])    08/09/2017    Past Surgical History:  Procedure Laterality Date  . APPENDECTOMY  1962  . BACK SURGERY  00-02-12   x3  . BASAL CELL CARCINOMA EXCISION  93/06/10  . COLONOSCOPY    . KNEE ARTHROSCOPY  005/01/02  . 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 TOTAL KNEE ARTHROPLASTY Right 10/08/2018   Procedure: RIGHT TOTAL KNEE ARTHROPLASTY;  Surgeon: [name], MD;  Location: [facility];  Service: Orthopedics;  Laterality: Right;  94min     Medications: Current Meds  Medication Sig  . ALPRAZolam (XANAX) 0.5 MG tablet Take 0.5 mg by mouth 2 (two) times daily.   [name] Kitchen atorvastatin (LIPITOR) 80 MG tablet Take 1 tablet (80 mg total) by mouth daily at 6 PM.  . benazepril (LOTENSIN) 40 MG tablet Take 1 tablet (40 mg total) by mouth daily.  [name] Kitchen ELIQUIS 5 MG TABS tablet TAKE 1 TABLET BY MOUTH TWICE DAILY  . ergocalciferol (VITAMIN D2) 50000 units capsule Take 50,000 Units by mouth once a week.   . furosemide (LASIX) 40 MG tablet Take  1.5 tablets (60 mg total) by mouth daily.  [name] Kitchen gabapentin (NEURONTIN) 300 MG capsule Take 2 capsules (600 mg total) by mouth at bedtime.  . hydroxyurea (HYDREA) 500 MG capsule Take 3 capsules (1,500 mg total) by mouth daily. May take with food to minimize GI side effects.  . insulin aspart (NOVOLOG) 100 UNIT/ML injection Inject 0-15 Units into the skin 3 (three) times daily with meals. Per sliding scale in separate instructions.  . insulin NPH-regular Human (NOVOLIN 70/30) (70-30) 100 UNIT/ML injection Inject 44 Units into the skin 2 (two) times daily with a meal.  . metFORMIN (GLUCOPHAGE) 1000 MG tablet Take 500 mg by mouth 2 (two) times daily.  . metoprolol tartrate (LOPRESSOR) 50 MG tablet Take 50 mg by mouth 2 (two) times daily.  . Multiple Vitamin (MULTIVITAMIN WITH MINERALS) TABS  tablet Take 1 tablet by mouth daily.  [name] Kitchen omeprazole (PRILOSEC) 20 MG capsule Take 20 mg by mouth daily.  [name] Kitchen venlafaxine XR (EFFEXOR-XR) 75 MG 24 hr capsule Take 75 mg by mouth daily.     Allergies: No Known Allergies  Social History: The patient  reports that he quit smoking about 27 years ago. His smoking use included cigars. He has a 30.00 pack-year smoking history. He has never used smokeless tobacco. He reports current alcohol use. He reports that he does not use drugs.   Family History: The patient's family history includes CVA in his father; Cancer in his mother; Diabetes Mellitus II in his mother; Heart failure in his father; Hypertension in his mother and sister.   Review of Systems: Please see the history of present illness.   All other systems are reviewed and negative.   Physical Exam: VS:  BP 98/60 (BP Location: Left Arm, Patient Position: Sitting, Cuff Size: Large)   Pulse (!) 53   Ht 6' (1.829 m)   Wt (!) 300 lb 12.8 oz (136.4 kg)   SpO2 100% Comment: at rest  BMI 40.80 kg/m  .  BMI Body mass index is 40.8 kg/m.  Wt Readings from Last 3 Encounters:  07/31/19 (!) 300 lb 12.8 oz (136.4 kg)  06/26/19 294 lb 12.8 oz (133.7 kg)  06/25/19 293 lb 12.8 oz (133.3 kg)    General: Pleasant. Alert and in no acute distress. He is morbidly obese.  HEENT: Normal.  Neck: Supple, no JVD, carotid bruits, or masses noted.  Cardiac: Regular rate and rhythm. No murmurs, rubs, or gallops. 1+ bilateral edema.  Respiratory:  Lungs are clear to auscultation bilaterally with normal work of breathing.  GI: Soft and nontender.  MS: No deformity or atrophy. Gait and ROM intact.  Skin: Warm and dry. Color is normal.  Neuro:  Strength and sensation are intact and no gross focal deficits noted.  Psych: Alert, appropriate and with normal affect.   LABORATORY DATA:  EKG:  EKG is not ordered today.  Lab Results  Component Value Date   WBC 7.0 06/26/2019   HGB 11.5 (L) 06/26/2019   HCT  34.6 (L) 06/26/2019   PLT 615 (H) 06/26/2019   GLUCOSE 237 (H) 07/04/2019   ALT 27 07/04/2019   AST 20 07/04/2019   NA 137 07/04/2019   K 4.9 07/04/2019   CL 101 07/04/2019   CREATININE 0.91 07/04/2019   BUN 17 07/04/2019   CO2 25 07/04/2019   TSH 2.881 10/13/2018   INR 1.23 10/03/2018   HGBA1C 7.8 (H) 08/10/2017       BNP (last 3 results) Recent Labs  10/13/18 1826  BNP 72.9    ProBNP (last 3 results) Recent Labs    02/28/19 1244 06/03/19 1135  PROBNP 416 1,269*     Other Studies Reviewed Today:  Long Term Study Highlights 05/2019   Basic rhythm is NSR  PAF and Paroxysmal atrial flutter noted.  AF/AFl burden 34%. Longest episode 11 hours. 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 I would like for him to see one of the electrophysiology physicians to determine if permanent pacemaker therapy is indicated.  A copy will be sent to [name], MD   ECHO IMPRESSIONS 05/2019   1. Left ventricular ejection fraction, by visual estimation, is 60 to 65%. The left ventricle has normal function. Normal left ventricular size. There is mildly increased left ventricular hypertrophy.  2. The tricuspid valve is normal in structure. Tricuspid valve regurgitation is trivial.  3. The aortic valve is tricuspid Aortic valve regurgitation was not visualized by color flow Doppler. Mild aortic valve sclerosis without stenosis.  4. Left ventricular diastolic Doppler parameters are consistent with pseudonormalization pattern of LV diastolic filling.  5. Global right ventricle has normal systolic function.The right ventricular size is mildly enlarged. No increase in right ventricular wall thickness.  6. Left atrial size was mildly dilated.  7. Right atrial size was moderately  dilated.  8. The mitral valve is normal in structure. No evidence of mitral valve regurgitation. No evidence of mitral stenosis.  9. The inferior vena cava is normal in size with greater than 50% respiratory variability, suggesting right atrial pressure of 3 mmHg. 10. The tricuspid regurgitant velocity is 2.08 m/s, and with an assumed right atrial pressure of 3 mmHg, the estimated right ventricular systolic pressure is severely elevated at 20.3 mmHg.  Assessment/Plan:  1. Chronic diastolic HF - may be exacerbated by his AF - probably getting too much salt as well, probably some degree of venous insufficiency - he does note improvement with elevation - compression stockings are difficult - he is going to try ACE wraps and continued elevation - told him to look at the lounge doctor as well.   2. PAF - 34% burden noted on recent outpatient monitoring - see below. He is on anticoagulation. He is on beta blocker - will not alter this dose at this time. Referring to EP.   3. Chronic anticoagulation - on Eliquis - no bleeding noted.   4. DM - per Dr. Forde Dandy   5. Tachy-brady - noted on recent monitoring - he is to see EP - we will refer - order is placed today to see Dr. Rayann Heman. Could do this virtual.   6. Hyperkalemia - not able to be on Aldactone - did not seem to positively influence his symptoms. Rechecking lab today with the increased dose of lasix.   7. Morbid obesity - [facility]   8. OSA   9. COVID-19 Education: The signs and symptoms of COVID-19 were discussed with the patient and how to seek care for testing (follow up with PCP or arrange E-visit).  The importance of social distancing, staying at home, hand hygiene and wearing a mask when out in public were discussed today.  Current medicines are reviewed with the patient today.  The patient does not have concerns regarding medicines other than what has been noted above.  The following  changes have been made:  See above.  Labs/ tests ordered today include:    Orders Placed This Encounter  Procedures  . Ambulatory referral to Cardiac Electrophysiology     Disposition:  Referral to see Dr. Rayann Heman. See [place] back in a few months. Lab today.     Patient is agreeable to this plan and will call if any problems develop in the interim.   SignedTruitt Merle, NP  07/31/2019 3:44 PM  [facility] [address] [place], [place]  [address] Phone: [phone] Fax: [phone]       Addendum: 08/12/19  Tamsen Snider sent to Burtis Junes, NP        This pt declined Dr.Allred appt. [place] tried X 3. Still cannot give pt results. This is the pt that stated never answers phone. This is just a FYI.   Thanks  [employer]

## 2019-07-31 ENCOUNTER — Other Ambulatory Visit: Payer: Self-pay

## 2019-07-31 ENCOUNTER — Ambulatory Visit: Payer: Medicare HMO | Admitting: Nurse Practitioner

## 2019-07-31 ENCOUNTER — Encounter: Payer: Self-pay | Admitting: Nurse Practitioner

## 2019-07-31 VITALS — BP 98/60 | HR 53 | Ht 72.0 in | Wt 300.8 lb

## 2019-07-31 DIAGNOSIS — I495 Sick sinus syndrome: Secondary | ICD-10-CM

## 2019-07-31 DIAGNOSIS — I480 Paroxysmal atrial fibrillation: Secondary | ICD-10-CM | POA: Diagnosis not present

## 2019-07-31 DIAGNOSIS — Z7901 Long term (current) use of anticoagulants: Secondary | ICD-10-CM | POA: Diagnosis not present

## 2019-07-31 DIAGNOSIS — I5032 Chronic diastolic (congestive) heart failure: Secondary | ICD-10-CM

## 2019-07-31 DIAGNOSIS — E875 Hyperkalemia: Secondary | ICD-10-CM

## 2019-07-31 DIAGNOSIS — Z7189 Other specified counseling: Secondary | ICD-10-CM

## 2019-07-31 NOTE — Patient Instructions (Addendum)
After Visit Summary:  We will be checking the following labs today - BMET & CBC   Medication Instructions:    Continue with your current medicines.    If you need a refill on your cardiac medications before your next appointment, please call your pharmacy.     Testing/Procedures To Be Arranged:  N/A  Follow-Up:   See Dr. Rayann Heman for discussion of possible drug therapy for your atrial fib and possible pacemaker implantation  See Dr. Tamala Julian in 4 months    At [facility], you and your health needs are our priority.  As part of our continuing mission to provide you with exceptional heart care, we have created designated Provider Care Teams.  These Care Teams include your primary Cardiologist (physician) and Advanced Practice Providers (APPs -  Physician Assistants and Nurse Practitioners) who all work together to provide you with the care you need, when you need it.  Special Instructions:  . Stay safe, stay home, wash your hands for at least 20 seconds and wear a mask when out in public.  . It was good to talk with you today.   . Look at getting the "Lounge Doctor" . Look at using compression stockings during the daytime . Look at using extra large Ace wraps in place of compression stockings.  . [facility] and [facility]   Call the [place] office at [phone] if you have any questions, problems or concerns.

## 2019-08-01 LAB — CBC
Hematocrit: 32 % — ABNORMAL LOW (ref 37.5–51.0)
Hemoglobin: 11.1 g/dL — ABNORMAL LOW (ref 13.0–17.7)
MCH: 33.7 pg — ABNORMAL HIGH (ref 26.6–33.0)
MCHC: 34.7 g/dL (ref 31.5–35.7)
MCV: 97 fL (ref 79–97)
Platelets: 436 10*3/uL (ref 150–450)
RBC: 3.29 x10E6/uL — ABNORMAL LOW (ref 4.14–5.80)
RDW: 18.2 % — ABNORMAL HIGH (ref 11.6–15.4)
WBC: 6.5 10*3/uL (ref 3.4–10.8)

## 2019-08-01 LAB — BASIC METABOLIC PANEL
BUN/Creatinine Ratio: 17 (ref 10–24)
BUN: 16 mg/dL (ref 8–27)
CO2: 26 mmol/L (ref 20–29)
Calcium: 9.3 mg/dL (ref 8.6–10.2)
Chloride: 99 mmol/L (ref 96–106)
Creatinine, Ser: 0.92 mg/dL (ref 0.76–1.27)
GFR calc Af Amer: 94 mL/min/{1.73_m2} (ref >59)
GFR calc non Af Amer: 81 mL/min/{1.73_m2} (ref >59)
Glucose: 198 mg/dL — ABNORMAL HIGH (ref 65–99)
Potassium: 5.1 mmol/L (ref 3.5–5.2)
Sodium: 139 mmol/L (ref 134–144)

## 2019-08-07 ENCOUNTER — Telehealth: Payer: Self-pay

## 2019-08-08 ENCOUNTER — Encounter: Payer: Self-pay | Admitting: Oncology

## 2019-08-10 ENCOUNTER — Other Ambulatory Visit: Payer: Self-pay | Admitting: Oncology

## 2019-08-12 ENCOUNTER — Telehealth (INDEPENDENT_AMBULATORY_CARE_PROVIDER_SITE_OTHER): Payer: Medicare HMO | Admitting: Internal Medicine

## 2019-08-12 ENCOUNTER — Encounter: Payer: Self-pay | Admitting: Internal Medicine

## 2019-08-12 VITALS — Ht 72.0 in | Wt 300.0 lb

## 2019-08-12 DIAGNOSIS — I480 Paroxysmal atrial fibrillation: Secondary | ICD-10-CM | POA: Diagnosis not present

## 2019-08-12 DIAGNOSIS — G4733 Obstructive sleep apnea (adult) (pediatric): Secondary | ICD-10-CM

## 2019-08-12 DIAGNOSIS — I5032 Chronic diastolic (congestive) heart failure: Secondary | ICD-10-CM

## 2019-08-12 NOTE — Progress Notes (Signed)
Electrophysiology TeleHealth Note   Due to national recommendations of social distancing due to COVID 19, an audio/video telehealth visit is felt to be most appropriate for this patient at this time.  See MyChart message from today for the patient's consent to telehealth for [facility].   Date:  08/12/2019   ID:  [name], DOB [birth year], MRN [id number]  Location: patient's home  Provider location:  [facility]  Evaluation Performed: new consult  PCP:  Charles Bowen, MD   Electrophysiologist:  Dr Rayann Heman  Reason for Consult: AF  History of Present Illness:    [name] is a 78 y.o. male who presents via telehealth conferencing today. He is referred by Dr Tamala Julian for evaluation of atrial fibrillation.  He has known paroxysmal atrial fibrillation and wore an event monitor which demonstrated some atrial fibrillation with RVR as well as 3 second post termination pauses and some sinus bradycardia. He has been asymptomatic with pauses and bradycardia.  Today, he denies symptoms of palpitations, chest pain, shortness of breath,  lower extremity edema, dizziness, presyncope, or syncope.  The patient is otherwise without complaint today.  The patient denies symptoms of fevers, chills, cough, or new SOB worrisome for COVID 19.  Past Medical History:  Diagnosis Date  . Anxiety   . Arthritis   . Cancer (HCC)    skin - basil cell  . Depression   . Diabetes mellitus without complication ([place])   . Dysrhythmia    a-fib  . GERD (gastroesophageal reflux disease)   . Hyperlipidemia   . Hypertension   . Neuropathy   . Obesity   . Paroxysmal atrial fibrillation (HCC)   . Peripheral vascular disease ([place])    diabetic neuropathy in both feet  . Sleep apnea    uses C-pap machine  . Stroke [facility])    08/09/2017    Past Surgical History:  Procedure Laterality Date  . APPENDECTOMY  1962  . BACK SURGERY  00-02-12   x3  . BASAL CELL CARCINOMA EXCISION  93/06/10  .  COLONOSCOPY    . KNEE ARTHROSCOPY  005/01/02  . 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 TOTAL KNEE ARTHROPLASTY Right 10/08/2018   Procedure: RIGHT TOTAL KNEE ARTHROPLASTY;  Surgeon: [name], MD;  Location: [facility];  Service: Orthopedics;  Laterality: Right;  42min    Current Outpatient Medications  Medication Sig Dispense Refill  . ALPRAZolam (XANAX) 0.5 MG tablet Take 0.5 mg by mouth 2 (two) times daily.     [name] Kitchen atorvastatin (LIPITOR) 80 MG tablet Take 1 tablet (80 mg total) by mouth daily at 6 PM. 30 tablet 0  . benazepril (LOTENSIN) 40 MG tablet Take 1 tablet (40 mg total) by mouth daily.    [name] Kitchen ELIQUIS 5 MG TABS tablet TAKE 1 TABLET BY MOUTH TWICE DAILY 60 tablet 9  . ergocalciferol (VITAMIN D2) 50000 units capsule Take 50,000 Units by mouth once a week.     . furosemide (LASIX) 40 MG tablet Take 1.5 tablets (60 mg total) by mouth daily. 90 tablet 3  . gabapentin (NEURONTIN) 300 MG capsule Take 2 capsules (600 mg total) by mouth at bedtime. 180 capsule 3  . hydroxyurea (HYDREA) 500 MG capsule TAKE 2 CAPSULES BY MOUTH ONCE DAILY  180 capsule 0  . insulin aspart (NOVOLOG) 100 UNIT/ML injection Inject 0-15 Units into  the skin 3 (three) times daily with meals. Per sliding scale in separate instructions. 6 mL 0  . insulin NPH-regular Human (NOVOLIN 70/30) (70-30) 100 UNIT/ML injection Inject 44 Units into the skin 2 (two) times daily with a meal.    . metFORMIN (GLUCOPHAGE) 1000 MG tablet Take 500 mg by mouth 2 (two) times daily.    . metoprolol tartrate (LOPRESSOR) 50 MG tablet Take 50 mg by mouth 2 (two) times daily.    . Multiple Vitamin (MULTIVITAMIN WITH MINERALS) TABS tablet Take 1 tablet by mouth daily.    [name] Kitchen omeprazole (PRILOSEC) 20 MG capsule Take 20 mg by mouth daily.    [name] Kitchen venlafaxine XR (EFFEXOR-XR) 75 MG 24 hr capsule Take 75 mg by mouth daily.      No current facility-administered medications for this visit.     Allergies:   Patient has no known allergies.   Social History:  The patient  reports that he quit smoking about 27 years ago. His smoking use included cigars. He has a 30.00 pack-year smoking history. He has never used smokeless tobacco. He reports current alcohol use. He reports that he does not use drugs.   Family History:  The patient's  family history includes CVA in his father; Cancer in his mother; Diabetes Mellitus II in his mother; Heart failure in his father; Hypertension in his mother and sister.   ROS:  Please see the history of present illness.   All other systems are personally reviewed and negative.    Exam:    Vital Signs:  Ht 6' (1.829 m)   Wt 300 lb (136.1 kg)   BMI 40.69 kg/m   Well sounding and appearing, alert and conversant, regular work of breathing,  good skin color Eyes- anicteric, neuro- grossly intact, skin- no apparent rash or lesions or cyanosis, mouth- oral mucosa is pink  Labs/Other Tests and Data Reviewed:    Recent Labs: 10/13/2018: B Natriuretic Peptide 72.9; Magnesium 1.9; TSH 2.881 06/03/2019: NT-Pro BNP 1,269 07/04/2019: ALT 27 07/31/2019: BUN 16; Creatinine, Ser 0.92; Hemoglobin 11.1; Platelets 436; Potassium 5.1; Sodium 139   Wt Readings from Last 3 Encounters:  08/12/19 300 lb (136.1 kg)  07/31/19 (!) 300 lb 12.8 oz (136.4 kg)  06/26/19 294 lb 12.8 oz (133.7 kg)    Event monitor and echo reviewed    ASSESSMENT & PLAN:    1.  Paroxysmal atrial fibrillation He is relatively asymptomatic While wearing monitor, he did not have symptoms of dizziness, pre-syncope or syncope.  Continue Eliquis for CHADS2VASC of 6 He is not a candidate for ablation with obesity. Increased RA pressures contributing to AF. Could consider Tikosyn for rhythm control, but would require 3 day hospital stay  2.  OSA Compliance with CPAP recommended  3.  Obesity Body mass index is 40.69 kg/m.  Weight loss encouraged Will refer to exercise program at [facility] Encouraged Weight Watchers  4.  Chronic diastolic heart failure Per Dr Smith/Lori   Follow-up:  With AF clinic in 3 months. I will see as needed   Patient Risk:  after full review of this patients clinical status, I feel that they are at moderate risk at this time.  Today, I have spent 15 minutes with the patient with telehealth technology discussing arrhythmia management .    SignedThompson Grayer, MD  08/12/2019 3:35 PM     [place] [address] [phone] (office) [phone] (fax)

## 2019-08-13 DIAGNOSIS — R69 Illness, unspecified: Secondary | ICD-10-CM | POA: Diagnosis not present

## 2019-08-21 ENCOUNTER — Telehealth: Payer: Self-pay | Admitting: Internal Medicine

## 2019-08-21 ENCOUNTER — Telehealth: Payer: Self-pay

## 2019-08-21 NOTE — Telephone Encounter (Signed)
New Message  Patient is calling and he want to discuss a conversation he had with dr. Rayann Heman a couple weeks ago  Please return call

## 2019-08-21 NOTE — Telephone Encounter (Signed)
Patient is calling to find out if Dr. Rayann Heman has spoken to [name] about getting him started in an exercise program for individuals with A Fib. He would like to get started with this program ASAP.

## 2019-08-21 NOTE — Telephone Encounter (Signed)
Spoke to [name] about the 12-week PREP at [employer] starting on 09/16/19.  He sounds very motivated to make healthy lifestyle changes.  Intake appt is on 12/16 at 11:30.

## 2019-08-24 ENCOUNTER — Other Ambulatory Visit: Payer: Self-pay | Admitting: Interventional Cardiology

## 2019-08-26 NOTE — Telephone Encounter (Signed)
Jackelyn Poling,  Mr Barrish seems motivated.   Jeneen Rinks

## 2019-08-26 NOTE — Telephone Encounter (Signed)
Prescription refill request for Eliquis received.  Last office visit: 08/02/2019, Allred Scr: 0.92, 07/31/2019 Age: 78 y.o. Weight: 136.4kg  Prescription refill sent.

## 2019-08-27 ENCOUNTER — Other Ambulatory Visit: Payer: Self-pay

## 2019-08-27 ENCOUNTER — Encounter: Payer: Medicare HMO | Attending: Endocrinology | Admitting: *Deleted

## 2019-08-27 DIAGNOSIS — Z794 Long term (current) use of insulin: Secondary | ICD-10-CM | POA: Insufficient documentation

## 2019-08-27 DIAGNOSIS — E119 Type 2 diabetes mellitus without complications: Secondary | ICD-10-CM | POA: Insufficient documentation

## 2019-08-27 NOTE — Patient Instructions (Signed)
Plan:  Aim for 3-4 Carb Choices per meal (45-60 grams)   Aim for 0-2 Carbs per snack if hungry  Include protein in moderation with your meals and snacks Consider reading food labels for Total Carbohydrate of foods Consider  increasing your activity level by attending the [facility] Class for people with atrial fib as tolerated Continue checking BG daiyy  Continue taking medication as directed by MD

## 2019-08-27 NOTE — Progress Notes (Signed)
Diabetes Self-Management Education  Visit Type: First/Initial  Appt. Start Time: 1400 Appt. End Time: [id number]  08/27/2019  [name], identified by name and date of birth, is a 78 y.o. male with a diagnosis of Diabetes: Type 2. Patient is interested in controlling his blood sugars effectively and especially interested in losing some weight. He states after his last knee surgery, he had some eyesight changes and with Covid, his activity level decreased significantly. He has tried Weight Watchers a few times with some success but is not interested in that option currently. He states he prepares most of his meals at home as his wife doesn't cook very often anymore. He is testing his blood sugar daily before his breakfast, which is about noon time. He sleeps from about 1:30 AM to noon-ish most days.   ASSESSMENT  There were no vitals taken for this visit. There is no height or weight on file to calculate BMI.  Diabetes Self-Management Education - 08/27/19 1439      Visit Information   Visit Type  First/Initial      Initial Visit   Diabetes Type  Type 2    Are you currently following a meal plan?  No    Are you taking your medications as prescribed?  Yes    Date Diagnosed  26 years ago      Health Coping   How would you rate your overall health?  Good      Psychosocial Assessment   Patient Belief/Attitude about Diabetes  Afraid    Self-care barriers  Debilitated state due to current medical condition   struggling with obesity after knee surgery and decreased activity options   Other persons present  Patient    Patient Concerns  Nutrition/Meal planning;Weight Control;Glycemic Control    Special Needs  None    Preferred Learning Style  Visual;Auditory;Hands on    Learning Readiness  Ready    How often do you need to have someone help you when you read instructions, pamphlets, or other written materials from your doctor or pharmacy?  1 - Never    What is the last grade level you  completed in school?  college      Complications   How often do you check your blood sugar?  1-2 times/day    Fasting Blood glucose range (mg/dL)  70-129;130-179    Number of hypoglycemic episodes per month  0    Have you had a dilated eye exam in the past 12 months?  Yes   history of retinopathy   Have you had a dental exam in the past 12 months?  Yes    Are you checking your feet?  Yes    How many days per week are you checking your feet?  7      Dietary Intake   Breakfast  12 noon to 2 PM) oatmeal with cinnamon, fruit (berries and bananas), Stevia. Occasionally 2nd banana. 2-3 spoonfulls peanut butter occasionally on crackers. If still hungry, will eat can of fruit    Dinner  7-9PM:rotisserie chicken, salmon or other meat with occasional baked potato, salad or cooked vegetables OR [employer] meal salad OR take out meal    Beverage(s)  coffee with flavored creamer & Stevia, water, flavored water, up to 1 beer / night      Exercise   Exercise Type  ADL's    How many days per week to you exercise?  0    How many minutes per day do you  exercise?  0    Total minutes per week of exercise  0      Patient Education   Previous Diabetes Education  Yes (please comment)   several times over the past 15 years   Nutrition management   Role of diet in the treatment of diabetes and the relationship between the three main macronutrients and blood glucose level;Carbohydrate counting;Other (comment)   assisted with meal planning   Physical activity and exercise   Helped patient identify appropriate exercises in relation to his/her diabetes, diabetes complications and other health issue.;Role of exercise on diabetes management, blood pressure control and cardiac health.    Medications  Reviewed patients medication for diabetes, action, purpose, timing of dose and side effects.      Individualized Goals (developed by patient)   Nutrition  Follow meal plan discussed    Physical Activity  Exercise 3-5  times per week    Medications  take my medication as prescribed    Monitoring   test my blood glucose as discussed      Post-Education Assessment   Patient understands incorporating nutritional management into lifestyle.  Needs Review    Patient undertands incorporating physical activity into lifestyle.  Demonstrates understanding / competency    Patient understands using medications safely.  Needs Review      Outcomes   Expected Outcomes  Demonstrated interest in learning. Expect positive outcomes    Future DMSE  4-6 wks    Program Status  Not Completed       Individualized Plan for Diabetes Self-Management Training:   Learning Objective:  Patient will have a greater understanding of diabetes self-management. Patient education plan is to attend individual and/or group sessions per assessed needs and concerns.   Plan:   Patient Instructions  Plan:  Aim for 3-4 Carb Choices per meal (45-60 grams)   Aim for 0-2 Carbs per snack if hungry  Include protein in moderation with your meals and snacks Consider reading food labels for Total Carbohydrate of foods Consider  increasing your activity level by attending the [facility] Class for people with atrial fib as tolerated Continue checking BG daiyy  Continue taking medication as directed by MD  Expected Outcomes:  Demonstrated interest in learning. Expect positive outcomes  Education material provided: Meal plan card and Carbohydrate counting sheet  If problems or questions, patient to contact team via:  Phone  Future DSME appointment: 4-6 wks

## 2019-08-28 NOTE — Progress Notes (Signed)
[facility] Report   Patient Details  Name: JUVENAL RENNEY MRN: [id number] Date of Birth: [birth year] Age: 78 y.o. PCP: [name], MD  Vitals:   08/28/19 1252  BP: 138/62  Pulse: (!) 49  Resp: 18  SpO2: 98%  Weight: 299 lb 3.2 oz (135.7 kg)  Height: 5' 11.75" (1.822 m)     [facility] Eval - 08/28/19 1200      Referral    Referring Provider  Dr. Rayann Heman    Reason for referral  Diabetes;Family History;Obesitity/Overweight;Inactivity;Other;Orthopedic    Program Start Date  09/16/19      Measurement   Neck measurement  18.5 Inches    Waist Circumference  56 inches    Body fat  --   "E" meaning too high to register     Information for Trainer   Goals  "Get back into exercise, to lose 12lbs, improve balance/stability    Current Exercise  none    Orthopedic Concerns  bilat knee replacements    Pertinent Medical History  afib, IDDM,neuropathy, balance    Current Barriers  neuropathy and balance    Restrictions/Precautions  Diabetic snack before exercise;Fall risk;Other   has walking sticks in car     Timed Up and Go (TUGS)   Timed Up and Go  High risk >13 seconds   18.05secs     Mobility and Daily Activities   I find it easy to walk up or down two or more flights of stairs.  1   d/t "weak knees" takes a step at a time   I have no trouble taking out the trash.  3    I do housework such as vacuuming and dusting on my own without difficulty.  3    I can easily lift a gallon of milk (8lbs).  4    I can easily walk a mile.  1    I have no trouble reaching into high cupboards or reaching down to pick up something from the floor.  1    I do not have trouble doing out-door work such as Armed forces logistics/support/administrative officer, raking leaves, or gardening.  1      Mobility and Daily Activities   I feel younger than my age.  3    I feel independent.  3    I feel energetic.  2    I live an active life.   2    I feel strong.  2    I feel healthy.  2    I feel active as other  people my age.  2      How fit and strong are you.   Fit and Strong Total Score  30      Past Medical History:  Diagnosis Date  . Anxiety   . Arthritis   . Cancer (HCC)    skin - basil cell  . Depression   . Diabetes mellitus without complication ([place])   . Dysrhythmia    a-fib  . GERD (gastroesophageal reflux disease)   . Hyperlipidemia   . Hypertension   . Neuropathy   . Obesity   . Paroxysmal atrial fibrillation (HCC)   . Peripheral vascular disease ([place])    diabetic neuropathy in both feet  . Sleep apnea    uses C-pap machine  . Stroke [facility])    08/09/2017   Past Surgical History:  Procedure Laterality Date  . APPENDECTOMY  1962  . BACK SURGERY  00-02-12   x3  .  BASAL CELL CARCINOMA EXCISION  93/06/10  . COLONOSCOPY    . KNEE ARTHROSCOPY  005/01/02  . 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 TOTAL KNEE ARTHROPLASTY Right 10/08/2018   Procedure: RIGHT TOTAL KNEE ARTHROPLASTY;  Surgeon: [name], MD;  Location: [facility];  Service: Orthopedics;  Laterality: Right;  55min   Social History   Tobacco Use  Smoking Status Former Smoker  . Packs/day: 1.00  . Years: 30.00  . Pack years: 30.00  . Types: Cigars  . Quit date: 08/02/1992  . Years since quitting: 27.0  Smokeless Tobacco Never Used    Mr. Magnone is eager to begin the 12 week PREP at the [facility] on Mon/Wed 2:30-3:45 on Sep 16, 2019.     Vanita Ingles 08/28/2019, 1:00 PM

## 2019-09-12 ENCOUNTER — Telehealth: Payer: Self-pay

## 2019-09-12 NOTE — Telephone Encounter (Signed)
Attempted call to patient for change in start date of PREP to 1/18.  Unable to leave a message. Will send email today and retry again to ensure message is received.

## 2019-09-24 DIAGNOSIS — R69 Illness, unspecified: Secondary | ICD-10-CM | POA: Diagnosis not present

## 2019-09-25 DIAGNOSIS — Z794 Long term (current) use of insulin: Secondary | ICD-10-CM | POA: Diagnosis not present

## 2019-09-25 DIAGNOSIS — H4312 Vitreous hemorrhage, left eye: Secondary | ICD-10-CM | POA: Diagnosis not present

## 2019-09-25 DIAGNOSIS — H35373 Puckering of macula, bilateral: Secondary | ICD-10-CM | POA: Diagnosis not present

## 2019-09-25 DIAGNOSIS — E113553 Type 2 diabetes mellitus with stable proliferative diabetic retinopathy, bilateral: Secondary | ICD-10-CM | POA: Diagnosis not present

## 2019-09-25 DIAGNOSIS — H43813 Vitreous degeneration, bilateral: Secondary | ICD-10-CM | POA: Diagnosis not present

## 2019-09-25 DIAGNOSIS — H2513 Age-related nuclear cataract, bilateral: Secondary | ICD-10-CM | POA: Diagnosis not present

## 2019-09-26 ENCOUNTER — Other Ambulatory Visit: Payer: Self-pay

## 2019-09-26 ENCOUNTER — Encounter: Payer: Medicare HMO | Attending: Endocrinology | Admitting: *Deleted

## 2019-09-26 DIAGNOSIS — Z794 Long term (current) use of insulin: Secondary | ICD-10-CM | POA: Diagnosis not present

## 2019-09-26 DIAGNOSIS — E119 Type 2 diabetes mellitus without complications: Secondary | ICD-10-CM | POA: Diagnosis not present

## 2019-09-26 NOTE — Progress Notes (Signed)
Diabetes Self-Management Education  Visit Type:  Follow-up  Appt. Start Time: 1415 Appt. End Time: [id number]  09/26/2019  [name], identified by name and date of birth, is a 79 y.o. male with a diagnosis of Diabetes: Type 2.  Patient initially states he has not made any changes since our last visit, but then realizes he has increased his portions of vegetables, including salads daily now. He has signed up for classes at [facility] for atrial fib, but they were postponed, now planned to start next week. He also states his eye problems are improving and his retina specialist has provided some encouragement for further improvements.   ASSESSMENT  There were no vitals taken for this visit. There is no height or weight on file to calculate BMI.   Diabetes Self-Management Education - 09/26/19 1538      Psychosocial Assessment   Patient Concerns  Nutrition/Meal planning      Subsequent Visit   Since your last visit have you continued or begun to take your medications as prescribed?  Yes    Since your last visit, are you checking your blood glucose at least once a day?  Yes       Learning Objective:  Patient will have a greater understanding of diabetes self-management. Patient education plan is to attend individual and/or group sessions per assessed needs and concerns.   Plan:   Patient Instructions  Plan: continue   Aim for 3-4 Carb Choices per meal (45-60 grams)    Aim for 0-2 Carbs per snack if hungry   Congratulations on increasing your vegetable portions since our last visit.  Include protein in moderation with your meals and snacks  Consider reading food labels for Total Carbohydrate of foods  Consider  increasing your activity level by attending the [facility] Class for people with atrial fib as tolerated  Because exercise will lower your blood sugars, bring some glucose tablets with you so you will be prepared in case you need them.   Also consider checking your blood  sugar after you get home from exercise. IF it lowers your blood sugar into the low 100's, consider taking less insulin on those days, if OK with your MD  Continue checking blood sugars daily   Expected Outcomes:     Education material provided: No new handouts today  If problems or questions, patient to contact team via:  Phone  Future DSME appointment: -  PRN

## 2019-09-26 NOTE — Patient Instructions (Addendum)
Plan: continue   Aim for 3-4 Carb Choices per meal (45-60 grams)    Aim for 0-2 Carbs per snack if hungry   Congratulations on increasing your vegetable portions since our last visit.  Include protein in moderation with your meals and snacks  Consider reading food labels for Total Carbohydrate of foods  Consider  increasing your activity level by attending the [facility] Class for people with atrial fib as tolerated  Because exercise will lower your blood sugars, bring some glucose tablets with you so you will be prepared in case you need them.   Also consider checking your blood sugar after you get home from exercise. IF it lowers your blood sugar into the low 100's, consider taking less insulin on those days, if OK with your MD  Continue checking blood sugars daily

## 2019-10-04 ENCOUNTER — Telehealth: Payer: Self-pay | Admitting: Physician Assistant

## 2019-10-04 DIAGNOSIS — R234 Changes in skin texture: Secondary | ICD-10-CM | POA: Diagnosis not present

## 2019-10-04 DIAGNOSIS — E1142 Type 2 diabetes mellitus with diabetic polyneuropathy: Secondary | ICD-10-CM | POA: Diagnosis not present

## 2019-10-04 DIAGNOSIS — E119 Type 2 diabetes mellitus without complications: Secondary | ICD-10-CM | POA: Diagnosis not present

## 2019-10-04 DIAGNOSIS — R600 Localized edema: Secondary | ICD-10-CM | POA: Diagnosis not present

## 2019-10-04 NOTE — Telephone Encounter (Signed)
Pt called concerned that he would need permission to receive his COVID vaccine tomorrow while on eliquis. I reviewed risk of bruising and he is agreeable to receive the vaccine. I gave verbal permission over the phone but expressed that I do not know how to get him written permission as it is after hours. If there is a question tomorrow, he may call our office and speak with the DOD.

## 2019-10-05 ENCOUNTER — Ambulatory Visit: Payer: Medicare HMO | Attending: Internal Medicine

## 2019-10-05 DIAGNOSIS — Z23 Encounter for immunization: Secondary | ICD-10-CM | POA: Insufficient documentation

## 2019-10-05 DIAGNOSIS — I5032 Chronic diastolic (congestive) heart failure: Secondary | ICD-10-CM

## 2019-10-05 NOTE — Progress Notes (Signed)
   Covid-19 Vaccination Clinic  Name:  ENAN OUTMAN    MRN: [id number] DOB: 1940-10-23  10/05/2019  Mr. Gusmano was observed post Covid-19 immunization for 15 minutes without incidence. He was provided with Vaccine Information Sheet and instruction to access the V-Safe system.   Mr. Obara was instructed to call 911 with any severe reactions post vaccine: [name] Kitchen Difficulty breathing  . Swelling of your face and throat  . A fast heartbeat  . A bad rash all over your body  . Dizziness and weakness    Immunizations Administered    Name Date Dose VIS Date Route   Pfizer COVID-19 Vaccine 10/05/2019  1:42 PM 0.3 mL 08/23/2019 Intramuscular   Manufacturer: [place]   Lot: [id number]   [place]: [id number]

## 2019-10-08 MED ORDER — TORSEMIDE 20 MG PO TABS: 40.0000 mg | ORAL_TABLET | Freq: Every day | ORAL | 3 refills | Status: DC

## 2019-10-15 ENCOUNTER — Telehealth: Payer: Self-pay

## 2019-10-15 NOTE — Telephone Encounter (Signed)
Contacted patient reference date of next PREP class start date of 11/05/2019 will meet every tues/thur 1p-215pm x 12wks.

## 2019-10-18 ENCOUNTER — Other Ambulatory Visit: Payer: Self-pay

## 2019-10-18 ENCOUNTER — Other Ambulatory Visit: Payer: Medicare HMO | Admitting: *Deleted

## 2019-10-18 DIAGNOSIS — I5032 Chronic diastolic (congestive) heart failure: Secondary | ICD-10-CM | POA: Diagnosis not present

## 2019-10-19 LAB — BASIC METABOLIC PANEL
BUN/Creatinine Ratio: 18 (ref 10–24)
BUN: 21 mg/dL (ref 8–27)
CO2: 24 mmol/L (ref 20–29)
Calcium: 8.9 mg/dL (ref 8.6–10.2)
Chloride: 100 mmol/L (ref 96–106)
Creatinine, Ser: 1.16 mg/dL (ref 0.76–1.27)
GFR calc Af Amer: 70 mL/min/{1.73_m2} (ref >59)
GFR calc non Af Amer: 61 mL/min/{1.73_m2} (ref >59)
Glucose: 304 mg/dL — ABNORMAL HIGH (ref 65–99)
Potassium: 5.4 mmol/L — ABNORMAL HIGH (ref 3.5–5.2)
Sodium: 142 mmol/L (ref 134–144)

## 2019-10-22 DIAGNOSIS — B009 Herpesviral infection, unspecified: Secondary | ICD-10-CM | POA: Diagnosis not present

## 2019-10-22 DIAGNOSIS — C44629 Squamous cell carcinoma of skin of left upper limb, including shoulder: Secondary | ICD-10-CM | POA: Diagnosis not present

## 2019-10-26 ENCOUNTER — Ambulatory Visit: Payer: Medicare HMO | Attending: Internal Medicine

## 2019-10-26 DIAGNOSIS — Z23 Encounter for immunization: Secondary | ICD-10-CM | POA: Insufficient documentation

## 2019-10-26 NOTE — Progress Notes (Signed)
   Covid-19 Vaccination Clinic  Name:  [name]    MRN: [id number] DOB: Feb 07, 1941  10/26/2019  [name] was observed post Covid-19 immunization for 15 minutes without incidence. He was provided with Vaccine Information Sheet and instruction to access the V-Safe system.   [name] was instructed to call 911 with any severe reactions post vaccine: [name] Kitchen Difficulty breathing  . Swelling of your face and throat  . A fast heartbeat  . A bad rash all over your body  . Dizziness and weakness    Immunizations Administered    Name Date Dose VIS Date Route   Pfizer COVID-19 Vaccine 10/26/2019 12:53 PM 0.3 mL 08/23/2019 Intramuscular   Manufacturer: [place]   Lot: [id number]   [place]: [id number]

## 2019-11-02 ENCOUNTER — Other Ambulatory Visit: Payer: Self-pay | Admitting: Oncology

## 2019-11-05 NOTE — Progress Notes (Signed)
[facility] PREP Weekly Session   Patient Details  Name: [name] MRN: [id number] Date of Birth: May 13, 1941 Age: 80 y.o. PCP: [name], MD  There were no vitals filed for this visit.  [facility] Weekly seesion - 11/05/19 1500      Weekly Session   Topic Discussed  Goal setting and welcome to the program   Scale of perceived exertion    Comments  Given copy of 12 wk sched of strength training      Toured facility Reviewed overview of program and next steps Reviewed goals   Barnett Hatter 11/05/2019, 3:06 PM

## 2019-11-06 DIAGNOSIS — H43813 Vitreous degeneration, bilateral: Secondary | ICD-10-CM | POA: Diagnosis not present

## 2019-11-06 DIAGNOSIS — Z794 Long term (current) use of insulin: Secondary | ICD-10-CM | POA: Diagnosis not present

## 2019-11-06 DIAGNOSIS — E113552 Type 2 diabetes mellitus with stable proliferative diabetic retinopathy, left eye: Secondary | ICD-10-CM | POA: Diagnosis not present

## 2019-11-06 DIAGNOSIS — H2513 Age-related nuclear cataract, bilateral: Secondary | ICD-10-CM | POA: Diagnosis not present

## 2019-11-06 DIAGNOSIS — H4313 Vitreous hemorrhage, bilateral: Secondary | ICD-10-CM | POA: Diagnosis not present

## 2019-11-06 DIAGNOSIS — E113511 Type 2 diabetes mellitus with proliferative diabetic retinopathy with macular edema, right eye: Secondary | ICD-10-CM | POA: Diagnosis not present

## 2019-11-06 DIAGNOSIS — H35372 Puckering of macula, left eye: Secondary | ICD-10-CM | POA: Diagnosis not present

## 2019-11-12 DIAGNOSIS — I5032 Chronic diastolic (congestive) heart failure: Secondary | ICD-10-CM | POA: Diagnosis not present

## 2019-11-12 DIAGNOSIS — I480 Paroxysmal atrial fibrillation: Secondary | ICD-10-CM | POA: Diagnosis not present

## 2019-11-12 DIAGNOSIS — H3520 Other non-diabetic proliferative retinopathy, unspecified eye: Secondary | ICD-10-CM | POA: Diagnosis not present

## 2019-11-12 DIAGNOSIS — D473 Essential (hemorrhagic) thrombocythemia: Secondary | ICD-10-CM | POA: Diagnosis not present

## 2019-11-12 DIAGNOSIS — E1142 Type 2 diabetes mellitus with diabetic polyneuropathy: Secondary | ICD-10-CM | POA: Diagnosis not present

## 2019-11-12 DIAGNOSIS — E559 Vitamin D deficiency, unspecified: Secondary | ICD-10-CM | POA: Diagnosis not present

## 2019-11-12 DIAGNOSIS — E7849 Other hyperlipidemia: Secondary | ICD-10-CM | POA: Diagnosis not present

## 2019-11-12 DIAGNOSIS — N401 Enlarged prostate with lower urinary tract symptoms: Secondary | ICD-10-CM | POA: Diagnosis not present

## 2019-11-12 DIAGNOSIS — E1140 Type 2 diabetes mellitus with diabetic neuropathy, unspecified: Secondary | ICD-10-CM | POA: Diagnosis not present

## 2019-11-12 NOTE — Progress Notes (Signed)
[facility] PREP Weekly Session   Patient Details  Name: [name] MRN: [id number] Date of Birth: [birth year] Age: 79 y.o. PCP: [name], MD  There were no vitals filed for this visit.  [facility] Weekly seesion - 11/12/19 1700      Weekly Session   Topic Discussed  Importance of resistance training;Other ways to be active    Minutes exercised this week  0 minutes    Classes attended to date  2    Comments  no weekly progress report given      Did well on both the recumbant bike and recumbant elipitcal Will do fit testing on Thursday.   Barnett Hatter 11/12/2019, 5:05 PM

## 2019-11-19 DIAGNOSIS — B009 Herpesviral infection, unspecified: Secondary | ICD-10-CM | POA: Diagnosis not present

## 2019-11-19 NOTE — Progress Notes (Unsigned)
[facility] PREP Weekly Session   Patient Details  Name: [name] MRN: [id number] Date of Birth: 12-Aug-1941 Age: 79 y.o. PCP: [name], MD  There were no vitals filed for this visit.  [facility] Weekly seesion - 11/19/19 1800      Weekly Session   Topic Discussed  --   missed class 2/2 md appt, exercised on own       Barnett Hatter 11/19/2019, 6:21 PM

## 2019-11-20 DIAGNOSIS — H4313 Vitreous hemorrhage, bilateral: Secondary | ICD-10-CM | POA: Diagnosis not present

## 2019-11-20 DIAGNOSIS — E113592 Type 2 diabetes mellitus with proliferative diabetic retinopathy without macular edema, left eye: Secondary | ICD-10-CM | POA: Diagnosis not present

## 2019-11-20 DIAGNOSIS — E113551 Type 2 diabetes mellitus with stable proliferative diabetic retinopathy, right eye: Secondary | ICD-10-CM | POA: Diagnosis not present

## 2019-11-20 DIAGNOSIS — H4312 Vitreous hemorrhage, left eye: Secondary | ICD-10-CM | POA: Diagnosis not present

## 2019-11-20 DIAGNOSIS — H35373 Puckering of macula, bilateral: Secondary | ICD-10-CM | POA: Diagnosis not present

## 2019-11-23 DIAGNOSIS — R69 Illness, unspecified: Secondary | ICD-10-CM | POA: Diagnosis not present

## 2019-11-27 NOTE — Progress Notes (Signed)
Cardiology Office Note:    Date:  11/28/2019   ID:  [name], DOB 1941/06/10, MRN [id number]  PCP:  [name], MD  Cardiologist:  [name], MD   Referring MD: [name], MD   Chief Complaint  Patient presents with  . Atrial Fibrillation  . Congestive Heart Failure    History of Present Illness:    [name] is a 79 y.o. male with a hx of atrial fibrillation, CVA, obstructive sleep apnea, hypertension, diabetes mellitus II, PAD,PVD,and hyperlipidemia. Saw Dr. Rayann Heman, not felt to be a good candidate for ablation.  Tikosyn could be tried but will require a 3-day hospital stay.  Here today alone.  Using a stick to facilitate ambulation.  He is on an exercise program.  Endurance is improving.  No chest discomfort.  Denies orthopnea and PND.  He is unable to feel atrial fibrillation when he has it.  He has not had syncope.  Has sleep apnea.  Refuses to wear CPAP because he cannot tolerate it.  Past Medical History:  Diagnosis Date  . Anxiety   . Arthritis   . Cancer (HCC)    skin - basil cell  . Depression   . Diabetes mellitus without complication ([place])   . Dysrhythmia    a-fib  . GERD (gastroesophageal reflux disease)   . Hyperlipidemia   . Hypertension   . Neuropathy   . Obesity   . Paroxysmal atrial fibrillation (HCC)   . Peripheral vascular disease ([place])    diabetic neuropathy in both feet  . Sleep apnea    uses C-pap machine  . Stroke [facility])    08/09/2017    Past Surgical History:  Procedure Laterality Date  . APPENDECTOMY  1962  . BACK SURGERY  00-02-12   x3  . BASAL CELL CARCINOMA EXCISION  93/06/10  . COLONOSCOPY    . KNEE ARTHROSCOPY  005/01/02  . 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 TOTAL KNEE ARTHROPLASTY Right 10/08/2018   Procedure: RIGHT TOTAL KNEE ARTHROPLASTY;  Surgeon: [name], MD;  Location: [facility];  Service: Orthopedics;  Laterality: Right;  65min    Current Medications: Allergies as of 11/28/2019   No Known Allergies     Medication List       Accurate as of November 28, 2019  3:15 PM. If you have any questions, ask your nurse or doctor.        STOP taking these medications   insulin aspart 100 UNIT/ML injection Commonly known as: NovoLOG Stopped by: [name], MD     TAKE these medications   ALPRAZolam 0.5 MG tablet Commonly known as: XANAX Take 0.5 mg by mouth 2 (two) times daily.   atorvastatin 80 MG tablet Commonly known as: LIPITOR Take 1 tablet (80 mg total) by mouth daily at 6 PM.   benazepril 40 MG tablet Commonly known as: LOTENSIN Take 1 tablet (40 mg total) by mouth daily.   Eliquis 5 MG Tabs tablet Generic drug: apixaban Take 1 tablet by mouth twice daily   ergocalciferol 1.25 MG (50000 UT) capsule Commonly known as: VITAMIN D2 Take 50,000 Units by mouth once a week.   gabapentin 300 MG capsule Commonly known as: NEURONTIN Take 2 capsules (600 mg total) by mouth at bedtime.   hydroxyurea 500 MG capsule Commonly known as: HYDREA Take  1,000 mg by mouth daily. May take with food to minimize GI side effects. What changed: Another medication with the same name was removed. Continue taking this medication, and follow the directions you see here. Changed by: [name], MD   insulin NPH-regular Human (70-30) 100 UNIT/ML injection Inject 44 Units into the skin 2 (two) times daily with a meal.   metFORMIN 1000 MG tablet Commonly known as: GLUCOPHAGE Take 500 mg by mouth 2 (two) times daily.   metoprolol tartrate 50 MG tablet Commonly known as: LOPRESSOR Take 50 mg by mouth 2 (two) times daily.   multivitamin with minerals Tabs tablet Take 1 tablet by mouth daily.   omeprazole 20 MG capsule Commonly known as: PRILOSEC Take 20 mg by mouth daily.   torsemide 20 MG tablet Commonly known as:  DEMADEX Take 2 tablets (40 mg total) by mouth daily.   venlafaxine XR 75 MG 24 hr capsule Commonly known as: EFFEXOR-XR Take 75 mg by mouth daily.          Allergies:   Patient has no known allergies.   Social History   Socioeconomic History  . Marital status: Married    Spouse name: Not on file  . Number of children: Not on file  . Years of education: Not on file  . Highest education level: Not on file  Occupational History  . Not on file  Tobacco Use  . Smoking status: Former Smoker    Packs/day: 1.00    Years: 30.00    Pack years: 30.00    Types: Cigars    Quit date: 08/02/1992    Years since quitting: 27.3  . Smokeless tobacco: Never Used  Substance and Sexual Activity  . Alcohol use: Yes    Alcohol/week: 0.0 standard drinks    Comment: 1 beer a night.   . Drug use: No  . Sexual activity: Not on file  Other Topics Concern  . Not on file  Social History Narrative  . Not on file   Social Determinants of Health   Financial Resource Strain:   . Difficulty of Paying Living Expenses:   Food Insecurity:   . Worried About Charity fundraiser in the Last Year:   . Arboriculturist in the Last Year:   Transportation Needs:   . Film/video editor (Medical):   [name] Kitchen Lack of Transportation (Non-Medical):   Physical Activity:   . Days of Exercise per Week:   . Minutes of Exercise per Session:   Stress:   . Feeling of Stress :   Social Connections:   . Frequency of Communication with Friends and Family:   . Frequency of Social Gatherings with Friends and Family:   . Attends Religious Services:   . Active Member of Clubs or Organizations:   . Attends Archivist Meetings:   [name] Kitchen Marital Status:      Family History: The patient's family history includes CVA in his father; Cancer in his mother; Diabetes Mellitus II in his mother; Heart failure in his father; Hypertension in his mother and sister. There is no history of Colon cancer.  ROS:   Please see  the history of present illness.    Difficulty sleeping.  Lower extremity swelling, improved with torsemide.  Recent laboratory data with Dr. Forde Dandy, improved according to the patient.  All other systems reviewed and are negative.  EKGs/Labs/Other Studies Reviewed:    The following studies were reviewed today:  Long-term monitor October 2020:  Study Highlights   Basic rhythm is NSR  PAF and Paroxysmal atrial flutter noted.  AF/AFl burden 34%. Longest episode 11 hours. Fastest rate 140 bpm  Post conversion pauses up to 3.4 seconds with persistebt bradycardia < 35 bpm  Rare PVC's     EKG:  EKG most recent EKG is reviewed.  Recent Labs: 06/03/2019: NT-Pro BNP 1,269 07/04/2019: ALT 27 07/31/2019: Hemoglobin 11.1; Platelets 436 10/18/2019: BUN 21; Creatinine, Ser 1.16; Potassium 5.4; Sodium 142  Recent Lipid Panel No results found for: CHOL, TRIG, HDL, CHOLHDL, VLDL, LDLCALC, LDLDIRECT  Physical Exam:    VS:  BP (!) 120/58   Pulse 62   Ht 5' 11.5" (1.816 m)   Wt 298 lb (135.2 kg)   SpO2 97%   BMI 40.98 kg/m     Wt Readings from Last 3 Encounters:  11/28/19 298 lb (135.2 kg)  08/28/19 299 lb 3.2 oz (135.7 kg)  08/12/19 300 lb (136.1 kg)     GEN: Morbid obesity. No acute distress HEENT: Normal NECK: No JVD. LYMPHATICS: No lymphadenopathy CARDIAC:  RRR without murmur, gallop, or edema. VASCULAR:  Normal Pulses. No bruits. RESPIRATORY:  Clear to auscultation without rales, wheezing or rhonchi  ABDOMEN: Soft, non-tender, non-distended, No pulsatile mass, MUSCULOSKELETAL: No deformity  SKIN: Warm and dry NEUROLOGIC:  Alert and oriented x 3 PSYCHIATRIC:  Normal affect   ASSESSMENT:    1. Chronic diastolic CHF (congestive heart failure) (HCC)   2. Paroxysmal atrial fibrillation ([place])   3. Morbid obesity ([place])   4. Tachycardia-bradycardia syndrome ([place])   5. Obstructive sleep apnea   6. Essential hypertension   7. Chronic anticoagulation   8. Diabetes mellitus type  2, insulin dependent ([place])   9. Educated about COVID-19 virus infection    PLAN:    In order of problems listed above:  1. No clinical evidence of volume overload on exam currently. 2. At least 30% A. fib burden in November 2020.  Turndown is a candidate for ablation due to large left atrium and obesity. 3. He is motivated to exercise.  Has not yet started the exercise program due to orthopedic issues. 4. Tachycardia-bradycardia syndrome but without a threshold yet for pacemaker therapy. 5. CPAP compliance is advocated.  Consideration of CPAP. 6. Blood pressure control is stable. 7. No bleeding on the current regimen of apixaban 5 mg twice daily.  Continue same therapy. 8. I would hope at some point the patient could be transitioned to SGLT2 therapy and discontinue insulin if possible. 9. COVID-19 vaccine has been received.  Practicing social distancing.   Medication Adjustments/Labs and Tests Ordered: Current medicines are reviewed at length with the patient today.  Concerns regarding medicines are outlined above.  No orders of the defined types were placed in this encounter.  No orders of the defined types were placed in this encounter.   Patient Instructions  Medication Instructions:  Your physician recommends that you continue on your current medications as directed. Please refer to the Current Medication list given to you today.  *If you need a refill on your cardiac medications before your next appointment, please call your pharmacy*   Lab Work: None If you have labs (blood work) drawn today and your tests are completely normal, you will receive your results only by: [name] Kitchen MyChart Message (if you have MyChart) OR . A paper copy in the mail If you have any lab test that is abnormal or we need to change your treatment, we will call you to review  the results.   Testing/Procedures: None   Follow-Up: At [facility], you and your health needs are our priority.  As part of  our continuing mission to provide you with exceptional heart care, we have created designated Provider Care Teams.  These Care Teams include your primary Cardiologist (physician) and Advanced Practice Providers (APPs -  Physician Assistants and Nurse Practitioners) who all work together to provide you with the care you need, when you need it.  We recommend signing up for the patient portal called "MyChart".  Sign up information is provided on this After Visit Summary.  MyChart is used to connect with patients for Virtual Visits (Telemedicine).  Patients are able to view lab/test results, encounter notes, upcoming appointments, etc.  Non-urgent messages can be sent to your provider as well.   To learn more about what you can do with MyChart, go to [email or web address].    Your next appointment:   6 month(s)  The format for your next appointment:   In Person  Provider:   You may see [name], MD or one of the following Advanced Practice Providers on your designated Care Team:    Truitt Merle, NP  Cecilie Kicks, NP  Kathyrn Drown, NP    Other Instructions      Signed, [name], MD  11/28/2019 3:15 PM    [place]

## 2019-11-28 ENCOUNTER — Ambulatory Visit: Payer: Medicare HMO | Admitting: Interventional Cardiology

## 2019-11-28 ENCOUNTER — Other Ambulatory Visit: Payer: Self-pay

## 2019-11-28 ENCOUNTER — Encounter: Payer: Self-pay | Admitting: Interventional Cardiology

## 2019-11-28 VITALS — BP 120/58 | HR 62 | Ht 71.5 in | Wt 298.0 lb

## 2019-11-28 DIAGNOSIS — I495 Sick sinus syndrome: Secondary | ICD-10-CM | POA: Diagnosis not present

## 2019-11-28 DIAGNOSIS — I5032 Chronic diastolic (congestive) heart failure: Secondary | ICD-10-CM

## 2019-11-28 DIAGNOSIS — Z794 Long term (current) use of insulin: Secondary | ICD-10-CM | POA: Diagnosis not present

## 2019-11-28 DIAGNOSIS — Z7189 Other specified counseling: Secondary | ICD-10-CM | POA: Diagnosis not present

## 2019-11-28 DIAGNOSIS — Z7901 Long term (current) use of anticoagulants: Secondary | ICD-10-CM

## 2019-11-28 DIAGNOSIS — I10 Essential (primary) hypertension: Secondary | ICD-10-CM | POA: Diagnosis not present

## 2019-11-28 DIAGNOSIS — I480 Paroxysmal atrial fibrillation: Secondary | ICD-10-CM

## 2019-11-28 DIAGNOSIS — G4733 Obstructive sleep apnea (adult) (pediatric): Secondary | ICD-10-CM

## 2019-11-28 DIAGNOSIS — E119 Type 2 diabetes mellitus without complications: Secondary | ICD-10-CM

## 2019-11-28 NOTE — Patient Instructions (Signed)

## 2019-11-29 ENCOUNTER — Telehealth: Payer: Self-pay | Admitting: Interventional Cardiology

## 2019-11-29 NOTE — Telephone Encounter (Signed)
Medical records requested from [facility]. 11/29/19 vlm

## 2019-12-11 DIAGNOSIS — H35373 Puckering of macula, bilateral: Secondary | ICD-10-CM | POA: Diagnosis not present

## 2019-12-11 DIAGNOSIS — H4313 Vitreous hemorrhage, bilateral: Secondary | ICD-10-CM | POA: Diagnosis not present

## 2019-12-11 DIAGNOSIS — H2513 Age-related nuclear cataract, bilateral: Secondary | ICD-10-CM | POA: Diagnosis not present

## 2019-12-11 DIAGNOSIS — H43813 Vitreous degeneration, bilateral: Secondary | ICD-10-CM | POA: Diagnosis not present

## 2019-12-11 DIAGNOSIS — Z7984 Long term (current) use of oral hypoglycemic drugs: Secondary | ICD-10-CM | POA: Diagnosis not present

## 2019-12-11 DIAGNOSIS — E113553 Type 2 diabetes mellitus with stable proliferative diabetic retinopathy, bilateral: Secondary | ICD-10-CM | POA: Diagnosis not present

## 2019-12-18 DIAGNOSIS — E113592 Type 2 diabetes mellitus with proliferative diabetic retinopathy without macular edema, left eye: Secondary | ICD-10-CM | POA: Diagnosis not present

## 2019-12-18 DIAGNOSIS — H4311 Vitreous hemorrhage, right eye: Secondary | ICD-10-CM | POA: Diagnosis not present

## 2019-12-25 ENCOUNTER — Ambulatory Visit: Payer: Medicare HMO | Admitting: Dermatology

## 2019-12-26 ENCOUNTER — Encounter: Payer: Self-pay | Admitting: Dermatology

## 2019-12-26 ENCOUNTER — Other Ambulatory Visit: Payer: Self-pay

## 2019-12-26 ENCOUNTER — Ambulatory Visit: Payer: Medicare HMO | Admitting: Dermatology

## 2019-12-26 DIAGNOSIS — C44629 Squamous cell carcinoma of skin of left upper limb, including shoulder: Secondary | ICD-10-CM

## 2019-12-26 DIAGNOSIS — L570 Actinic keratosis: Secondary | ICD-10-CM | POA: Diagnosis not present

## 2019-12-26 DIAGNOSIS — D0462 Carcinoma in situ of skin of left upper limb, including shoulder: Secondary | ICD-10-CM | POA: Diagnosis not present

## 2019-12-26 DIAGNOSIS — C4492 Squamous cell carcinoma of skin, unspecified: Secondary | ICD-10-CM

## 2019-12-26 HISTORY — DX: Squamous cell carcinoma of skin, unspecified: C44.92

## 2019-12-26 NOTE — Patient Instructions (Addendum)
Biopsy, Surgery (Curettage) & Surgery (Excision) Aftercare Instructions  1. Okay to remove bandage in 24 hours  2. Wash area with soap and water  3. Apply Vaseline to area twice daily until healed (Not Neosporin)  4. Okay to cover with a Band-Aid to decrease the chance of infection or prevent irritation from clothing; also it's okay to uncover lesion at home.  5. Suture instructions: return to our office in 7-10 or 10-14 days for a nurse visit for suture removal. Variable healing with sutures, if pain or itching occurs call our office. It's okay to shower or bathe 24 hours after sutures are given.  6. The following risks may occur after a biopsy, curettage or excision: bleeding, scarring, discoloration, recurrence, infection (redness, yellow drainage, pain or swelling).  7. For questions, concerns and results call our office at [place] before 4pm & Friday before 3pm. Biopsy results will be available in 1 week.   [name] returns today for a hornlike lesion on his left forearm.  He was informed that this could either be a thick precancer or, more likely, a nonmelanoma skin cancer.  After shave biopsy the base was treated with curette (scraping) and cautery.  [name] understands that he will have an abrasion here for 2 weeks.  There are no restrictions on activity and he may apply either Vaseline or if he is not allergic triple antibiotic ointment.  I have asked him to please contact me by my chart or to call the office in 2 weeks to let me know it is mostly healed.  Additionally, thick pink 9 mm crusts on each forearm which fit precancers were treated with 5-second liquid nitrogen freeze.  He reports that his shingles discomfort is completely clear.

## 2019-12-29 NOTE — Progress Notes (Addendum)
   Follow-Up Visit   Subjective  [name] is a 79 y.o. male who presents for the following: Follow-up (Here to follow-up for shingles.  He states that he is doing much better.  Has a place on each forearm he would like you to look at since he is better.  Also, places on his right forehead that he would like looked at.).  growth Location: Forearm left Duration: Months Quality: larger Associated Signs/Symptoms: Modifying Factors:  Severity:  Timing: Context:   The following portions of the chart were reviewed this encounter and updated as appropriate:     Objective  Well appearing patient in no apparent distress; mood and affect are within normal limits.  A focused examination was performed including arms, head and neck. Relevant physical exam findings are noted in the Assessment and Plan.   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, including shoulder Left Forearm - Posterior  Destruction of lesion Complexity: simple   Destruction method: electrodesiccation and curettage   Informed consent: discussed and consent obtained   Timeout:  patient name, date of birth, surgical site, and procedure verified Anesthesia: the lesion was anesthetized in a standard fashion   Anesthetic:  1% lidocaine w/ epinephrine 1-100,000 local infiltration Curettage performed in three different directions: Yes   Curettage cycles:  3 Lesion length (cm):  1.2 Lesion width (cm):  1 Margin per side (cm):  0 Final wound size (cm):   1.2 Hemostasis achieved with:  ferric subsulfate Outcome: patient tolerated procedure well with no complications   Post-procedure details: wound care instructions given   Additional details:  Inoculated with parenteral 5% fluorouracil  Specimen 1 - Surgical pathology Differential Diagnosis: BCC vs SCC Check Margins: No  After shave biopsy, deeper keratin noted and removed by curettage, ED, re-curettage and inoculation of parenteral 5FU.

## 2019-12-30 ENCOUNTER — Encounter: Payer: Self-pay | Admitting: *Deleted

## 2020-01-05 ENCOUNTER — Other Ambulatory Visit: Payer: Self-pay | Admitting: Oncology

## 2020-01-07 ENCOUNTER — Other Ambulatory Visit: Payer: Self-pay | Admitting: Oncology

## 2020-01-07 ENCOUNTER — Other Ambulatory Visit: Payer: Self-pay | Admitting: *Deleted

## 2020-01-15 ENCOUNTER — Telehealth: Payer: Self-pay | Admitting: Dermatology

## 2020-01-15 DIAGNOSIS — E113553 Type 2 diabetes mellitus with stable proliferative diabetic retinopathy, bilateral: Secondary | ICD-10-CM | POA: Diagnosis not present

## 2020-01-15 DIAGNOSIS — H2513 Age-related nuclear cataract, bilateral: Secondary | ICD-10-CM | POA: Diagnosis not present

## 2020-01-15 DIAGNOSIS — H35373 Puckering of macula, bilateral: Secondary | ICD-10-CM | POA: Diagnosis not present

## 2020-01-15 DIAGNOSIS — H4313 Vitreous hemorrhage, bilateral: Secondary | ICD-10-CM | POA: Diagnosis not present

## 2020-01-15 DIAGNOSIS — Z794 Long term (current) use of insulin: Secondary | ICD-10-CM | POA: Diagnosis not present

## 2020-01-15 NOTE — Telephone Encounter (Signed)
Patient calling for bx results from his arm.

## 2020-01-16 ENCOUNTER — Encounter: Payer: Self-pay | Admitting: *Deleted

## 2020-01-16 ENCOUNTER — Telehealth: Payer: Self-pay | Admitting: *Deleted

## 2020-01-16 NOTE — Telephone Encounter (Signed)
Unable to leave message on patients phone cause the mailbox is full. Will send a message on my chart.

## 2020-02-05 DIAGNOSIS — H52203 Unspecified astigmatism, bilateral: Secondary | ICD-10-CM | POA: Diagnosis not present

## 2020-02-05 DIAGNOSIS — H25013 Cortical age-related cataract, bilateral: Secondary | ICD-10-CM | POA: Diagnosis not present

## 2020-02-05 DIAGNOSIS — H25043 Posterior subcapsular polar age-related cataract, bilateral: Secondary | ICD-10-CM | POA: Diagnosis not present

## 2020-02-05 DIAGNOSIS — H5203 Hypermetropia, bilateral: Secondary | ICD-10-CM | POA: Diagnosis not present

## 2020-02-05 DIAGNOSIS — Z794 Long term (current) use of insulin: Secondary | ICD-10-CM | POA: Diagnosis not present

## 2020-02-05 DIAGNOSIS — H2513 Age-related nuclear cataract, bilateral: Secondary | ICD-10-CM | POA: Diagnosis not present

## 2020-02-05 DIAGNOSIS — H35043 Retinal micro-aneurysms, unspecified, bilateral: Secondary | ICD-10-CM | POA: Diagnosis not present

## 2020-02-05 DIAGNOSIS — H524 Presbyopia: Secondary | ICD-10-CM | POA: Diagnosis not present

## 2020-02-05 DIAGNOSIS — E113552 Type 2 diabetes mellitus with stable proliferative diabetic retinopathy, left eye: Secondary | ICD-10-CM | POA: Diagnosis not present

## 2020-02-05 DIAGNOSIS — E113551 Type 2 diabetes mellitus with stable proliferative diabetic retinopathy, right eye: Secondary | ICD-10-CM | POA: Diagnosis not present

## 2020-02-06 ENCOUNTER — Encounter: Payer: Self-pay | Admitting: Dermatology

## 2020-02-06 ENCOUNTER — Other Ambulatory Visit: Payer: Self-pay

## 2020-02-06 ENCOUNTER — Ambulatory Visit (INDEPENDENT_AMBULATORY_CARE_PROVIDER_SITE_OTHER): Payer: Medicare HMO | Admitting: Dermatology

## 2020-02-06 DIAGNOSIS — D485 Neoplasm of uncertain behavior of skin: Secondary | ICD-10-CM | POA: Diagnosis not present

## 2020-02-06 DIAGNOSIS — D0439 Carcinoma in situ of skin of other parts of face: Secondary | ICD-10-CM | POA: Diagnosis not present

## 2020-02-06 DIAGNOSIS — D044 Carcinoma in situ of skin of scalp and neck: Secondary | ICD-10-CM | POA: Diagnosis not present

## 2020-02-06 DIAGNOSIS — L738 Other specified follicular disorders: Secondary | ICD-10-CM | POA: Diagnosis not present

## 2020-02-06 DIAGNOSIS — C44329 Squamous cell carcinoma of skin of other parts of face: Secondary | ICD-10-CM

## 2020-02-06 DIAGNOSIS — D099 Carcinoma in situ, unspecified: Secondary | ICD-10-CM

## 2020-02-06 NOTE — Progress Notes (Signed)
Follow-Up Visit   Subjective  [name] is a 79 y.o. male who presents for the following: Skin Problem (here for bx on right forehead).  Growths Location: Temples Duration: Several months Quality: Increased size Associated Signs/Symptoms: Modifying Factors:  Severity:  Timing: Context: History of skin cancers  The following portions of the chart were reviewed this encounter and updated as appropriate: Tobacco  Allergies  Meds  Problems  Med Hx  Surg Hx  Fam Hx      Objective  Well appearing patient in no apparent distress; mood and affect are within normal limits.  All skin waist up examined.   Assessment & Plan  Neoplasm of uncertain behavior of skin (3) Right Temple inferior  Skin / nail biopsy Type of biopsy: tangential   Informed consent: discussed and consent obtained   Timeout: patient name, date of birth, surgical site, and procedure verified   Anesthesia: the lesion was anesthetized in a standard fashion   Anesthetic:  1% lidocaine w/ epinephrine 1-100,000 local infiltration Instrument used: flexible razor blade   Hemostasis achieved with: ferric subsulfate   Outcome: patient tolerated procedure well   Post-procedure details: wound care instructions given   Additional details:  After biopsy lesion destruction performed.  Specimen 1 - Surgical pathology Differential Diagnosis: scc vs bcc Check Margins: No txpbx  Right temple superior  Skin / nail biopsy Type of biopsy: tangential   Informed consent: discussed and consent obtained   Timeout: patient name, date of birth, surgical site, and procedure verified   Anesthesia: the lesion was anesthetized in a standard fashion   Anesthetic:  1% lidocaine w/ epinephrine 1-100,000 local infiltration Instrument used: flexible razor blade   Hemostasis achieved with: ferric subsulfate   Outcome: patient tolerated procedure well   Post-procedure details: wound care instructions given   Additional  details:  After biopsy lesion destruction performed.  Specimen 2 - Surgical pathology Differential Diagnosis: scc vs bcc Check Margins: No txpbx  Left Temporal Scalp  Skin / nail biopsy Type of biopsy: tangential   Informed consent: discussed and consent obtained   Timeout: patient name, date of birth, surgical site, and procedure verified   Anesthesia: the lesion was anesthetized in a standard fashion   Anesthetic:  1% lidocaine w/ epinephrine 1-100,000 local infiltration Instrument used: flexible razor blade   Hemostasis achieved with: ferric subsulfate   Outcome: patient tolerated procedure well   Post-procedure details: wound care instructions given   Additional details:  After biopsy lesion destruction performed.  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  Destruction of lesion Complexity: simple   Destruction method: electrodesiccation and curettage   Informed consent: discussed and consent obtained   Timeout:  patient name, date of birth, surgical site, and procedure verified Anesthesia: the lesion was anesthetized in a standard fashion   Anesthetic:  1% lidocaine w/ epinephrine 1-100,000 local infiltration Curettage performed in three different directions: Yes   Electrodesiccation performed over the curetted area: Yes   Curettage cycles:  3 Lesion length (cm):  1.2 Lesion width (cm):  1 Hemostasis achieved with:  ferric subsulfate Outcome: patient tolerated procedure well with no complications   Post-procedure details: wound care instructions given   Additional details:  Inoculated with parenteral 5% fluorouracil  Left Forehead  Destruction of lesion Complexity: simple   Destruction method: electrodesiccation and curettage   Informed consent: discussed and consent obtained  Timeout:  patient name, date of birth, surgical site, and procedure  verified Anesthesia: the lesion was anesthetized in a standard fashion   Anesthetic:  1% lidocaine w/ epinephrine 1-100,000 local infiltration Curettage performed in three different directions: Yes   Electrodesiccation performed over the curetted area: Yes   Curettage cycles:  3 Lesion length (cm):  1.8 Margin per side (cm):  0 Hemostasis achieved with:  ferric subsulfate Outcome: patient tolerated procedure well with no complications   Post-procedure details: wound care instructions given   Additional details:  Inoculated with parenteral 5% fluorouracil Routine follow-up on [name] date of birth Jan 29, 1941. The treated spots on his left arm have healed without difficulty.  His shingles overall has done well although he does have 1 spot on his back that is occasionally sensitive.  He continues to take gabapentin for his peripheral neuropathy.  Focus today were 3 flat pink waxy crusts on his temple hairline area; the 2 on the right were 1+ centimeter apiece and the one on the left was 2+ centimeters.  After simple shave biopsy the bases were curetted and treated with parenteral 5-fluorouracil.  He can expect to have abrasion type crusting for 2 to 4 weeks but there are no restrictions in terms of bathing or bandaging.  I have asked [name] to please call my office or contact me by MyChart in 1 to 2 weeks to let me know he is doing okay he can get the results of the biopsies via MyChart in 3 days at the same time I do.  If there are no new problems that develop, I will recheck him in 6 months.

## 2020-02-06 NOTE — Patient Instructions (Signed)

## 2020-02-11 ENCOUNTER — Encounter: Payer: Self-pay | Admitting: Dermatology

## 2020-02-18 DIAGNOSIS — R69 Illness, unspecified: Secondary | ICD-10-CM | POA: Diagnosis not present

## 2020-02-24 ENCOUNTER — Other Ambulatory Visit: Payer: Self-pay | Admitting: Interventional Cardiology

## 2020-02-24 NOTE — Telephone Encounter (Signed)
Prescription refill request for Eliquis received.  Last office visit: [name] 11/28/2019 Scr: 1.2 12/02/2019 Age: 79 y.o. Weight: 135.2 kg   Prescription refill sent.

## 2020-03-04 DIAGNOSIS — H25043 Posterior subcapsular polar age-related cataract, bilateral: Secondary | ICD-10-CM | POA: Diagnosis not present

## 2020-03-04 DIAGNOSIS — E113552 Type 2 diabetes mellitus with stable proliferative diabetic retinopathy, left eye: Secondary | ICD-10-CM | POA: Diagnosis not present

## 2020-03-04 DIAGNOSIS — H2513 Age-related nuclear cataract, bilateral: Secondary | ICD-10-CM | POA: Diagnosis not present

## 2020-03-04 DIAGNOSIS — H25013 Cortical age-related cataract, bilateral: Secondary | ICD-10-CM | POA: Diagnosis not present

## 2020-03-04 DIAGNOSIS — H4313 Vitreous hemorrhage, bilateral: Secondary | ICD-10-CM | POA: Diagnosis not present

## 2020-03-04 DIAGNOSIS — H35373 Puckering of macula, bilateral: Secondary | ICD-10-CM | POA: Diagnosis not present

## 2020-03-04 DIAGNOSIS — E113591 Type 2 diabetes mellitus with proliferative diabetic retinopathy without macular edema, right eye: Secondary | ICD-10-CM | POA: Diagnosis not present

## 2020-03-04 DIAGNOSIS — Z7984 Long term (current) use of oral hypoglycemic drugs: Secondary | ICD-10-CM | POA: Diagnosis not present

## 2020-03-25 DIAGNOSIS — E1142 Type 2 diabetes mellitus with diabetic polyneuropathy: Secondary | ICD-10-CM | POA: Diagnosis not present

## 2020-03-25 DIAGNOSIS — N401 Enlarged prostate with lower urinary tract symptoms: Secondary | ICD-10-CM | POA: Diagnosis not present

## 2020-03-25 DIAGNOSIS — I700 Atherosclerosis of aorta: Secondary | ICD-10-CM | POA: Insufficient documentation

## 2020-03-25 DIAGNOSIS — D473 Essential (hemorrhagic) thrombocythemia: Secondary | ICD-10-CM | POA: Diagnosis not present

## 2020-03-25 DIAGNOSIS — R946 Abnormal results of thyroid function studies: Secondary | ICD-10-CM | POA: Diagnosis not present

## 2020-03-25 DIAGNOSIS — E1149 Type 2 diabetes mellitus with other diabetic neurological complication: Secondary | ICD-10-CM | POA: Diagnosis not present

## 2020-03-25 DIAGNOSIS — E1140 Type 2 diabetes mellitus with diabetic neuropathy, unspecified: Secondary | ICD-10-CM | POA: Diagnosis not present

## 2020-03-25 DIAGNOSIS — N1831 Chronic kidney disease, stage 3a: Secondary | ICD-10-CM | POA: Insufficient documentation

## 2020-03-25 DIAGNOSIS — I6930 Unspecified sequelae of cerebral infarction: Secondary | ICD-10-CM | POA: Diagnosis not present

## 2020-03-25 DIAGNOSIS — I5189 Other ill-defined heart diseases: Secondary | ICD-10-CM | POA: Diagnosis not present

## 2020-03-26 DIAGNOSIS — R69 Illness, unspecified: Secondary | ICD-10-CM | POA: Diagnosis not present

## 2020-04-20 DIAGNOSIS — E1142 Type 2 diabetes mellitus with diabetic polyneuropathy: Secondary | ICD-10-CM | POA: Diagnosis not present

## 2020-04-20 DIAGNOSIS — E119 Type 2 diabetes mellitus without complications: Secondary | ICD-10-CM | POA: Diagnosis not present

## 2020-04-20 DIAGNOSIS — R234 Changes in skin texture: Secondary | ICD-10-CM | POA: Diagnosis not present

## 2020-04-20 DIAGNOSIS — R600 Localized edema: Secondary | ICD-10-CM | POA: Diagnosis not present

## 2020-04-22 DIAGNOSIS — H35373 Puckering of macula, bilateral: Secondary | ICD-10-CM | POA: Diagnosis not present

## 2020-04-22 DIAGNOSIS — E113552 Type 2 diabetes mellitus with stable proliferative diabetic retinopathy, left eye: Secondary | ICD-10-CM | POA: Diagnosis not present

## 2020-04-22 DIAGNOSIS — H43813 Vitreous degeneration, bilateral: Secondary | ICD-10-CM | POA: Diagnosis not present

## 2020-04-22 DIAGNOSIS — E113591 Type 2 diabetes mellitus with proliferative diabetic retinopathy without macular edema, right eye: Secondary | ICD-10-CM | POA: Diagnosis not present

## 2020-04-22 DIAGNOSIS — H2513 Age-related nuclear cataract, bilateral: Secondary | ICD-10-CM | POA: Diagnosis not present

## 2020-04-22 DIAGNOSIS — Z7984 Long term (current) use of oral hypoglycemic drugs: Secondary | ICD-10-CM | POA: Diagnosis not present

## 2020-05-29 DIAGNOSIS — R946 Abnormal results of thyroid function studies: Secondary | ICD-10-CM | POA: Diagnosis not present

## 2020-06-09 DIAGNOSIS — M25422 Effusion, left elbow: Secondary | ICD-10-CM | POA: Diagnosis not present

## 2020-06-09 DIAGNOSIS — I10 Essential (primary) hypertension: Secondary | ICD-10-CM | POA: Diagnosis not present

## 2020-06-09 DIAGNOSIS — L089 Local infection of the skin and subcutaneous tissue, unspecified: Secondary | ICD-10-CM | POA: Diagnosis not present

## 2020-06-09 DIAGNOSIS — M7022 Olecranon bursitis, left elbow: Secondary | ICD-10-CM | POA: Diagnosis not present

## 2020-06-12 DIAGNOSIS — R69 Illness, unspecified: Secondary | ICD-10-CM | POA: Diagnosis not present

## 2020-06-17 DIAGNOSIS — H2513 Age-related nuclear cataract, bilateral: Secondary | ICD-10-CM | POA: Diagnosis not present

## 2020-06-17 DIAGNOSIS — E113552 Type 2 diabetes mellitus with stable proliferative diabetic retinopathy, left eye: Secondary | ICD-10-CM | POA: Diagnosis not present

## 2020-06-17 DIAGNOSIS — H25041 Posterior subcapsular polar age-related cataract, right eye: Secondary | ICD-10-CM | POA: Diagnosis not present

## 2020-06-17 DIAGNOSIS — H25013 Cortical age-related cataract, bilateral: Secondary | ICD-10-CM | POA: Diagnosis not present

## 2020-06-17 DIAGNOSIS — H25012 Cortical age-related cataract, left eye: Secondary | ICD-10-CM | POA: Diagnosis not present

## 2020-06-17 DIAGNOSIS — H2512 Age-related nuclear cataract, left eye: Secondary | ICD-10-CM | POA: Diagnosis not present

## 2020-06-17 DIAGNOSIS — H5203 Hypermetropia, bilateral: Secondary | ICD-10-CM | POA: Diagnosis not present

## 2020-06-17 DIAGNOSIS — H18523 Epithelial (juvenile) corneal dystrophy, bilateral: Secondary | ICD-10-CM | POA: Diagnosis not present

## 2020-06-17 DIAGNOSIS — E113591 Type 2 diabetes mellitus with proliferative diabetic retinopathy without macular edema, right eye: Secondary | ICD-10-CM | POA: Diagnosis not present

## 2020-06-17 DIAGNOSIS — H25043 Posterior subcapsular polar age-related cataract, bilateral: Secondary | ICD-10-CM | POA: Diagnosis not present

## 2020-06-17 DIAGNOSIS — Z794 Long term (current) use of insulin: Secondary | ICD-10-CM | POA: Diagnosis not present

## 2020-06-17 DIAGNOSIS — H43813 Vitreous degeneration, bilateral: Secondary | ICD-10-CM | POA: Diagnosis not present

## 2020-06-17 DIAGNOSIS — H18423 Band keratopathy, bilateral: Secondary | ICD-10-CM | POA: Diagnosis not present

## 2020-06-23 NOTE — Progress Notes (Deleted)
Cardiology Office Note   Date:  06/23/2020   ID:  [name], DOB Dec 29, 1940, MRN [id number]  PCP:  Charles Bowen, MD  Cardiologist: Dr. Tamala Julian, MD    No chief complaint on file.     History of Present Illness: [name] is a 79 y.o. male who presents for 6 month follow up, seen for Dr. Tamala Hall.   [name] has a history of paroxysmal atrial fibrillation, CVA, obstructive sleep apnea, hypertension, DM2, PAD, PVD and hyperlipidemia.  He was previously seen by Dr. Rayann Hall not felt to be a good candidate for AF ablation therefore Tikosyn was recommended.  He was most recently seen by Dr. Tamala Hall 11/28/2019 with no specific CV complaints.  He was asymptomatic with his atrial fibrillation.  He had increased his activity level with improved endurance.  Continue to refuse CPAP as he felt he is unable to tolerate.  Previous monitor showed at least 30% atrial fibrillation burden 07/2019.  Today,   1.  Paroxysmal atrial fibrillation on chronic anticoagulation: -Previously seen by Dr. Rayann Hall not felt to be an AF ablation candidate due to large left atrium size and obesity -Currently being treated with Eliquis 5 mg twice daily -No reports of bleeding in stool or urine -Obtain CBC -Continue Lopressor 50 mg twice daily   2.  Chronic diastolic CHF: -Most recent echocardiogram 06/03/2019 with stable LVEF at 60 to 65% with mild LVH and no valvular disease -Appears euvolemic on exam -Continue torsemide 40 mg daily  3.  Tachybradycardia syndrome: -Noted to be without threshold for PPM placement -Rates stable today per EKG  4.  OSA with CPAP noncompliance -Needs to consider CPAP therapy given AF  5.  HTN: -Stable,      Past Medical History:  Diagnosis Date   Anxiety    Arthritis    Basal cell carcinoma 07/18/1991   Left nasal brdige (MOHS)   Basal cell carcinoma 01/23/1992   lower right back-(CX35FU)   Basal cell carcinoma 05/20/2003   sup-left back (CX35FU)    Basal cell carcinoma 07/28/2011   post lower neck   Basal cell carcinoma 08/20/2008   right sideburn(MOHS), sup-Left upper back (CX35FU), sup-mid back (CX35FU), nod-Right lower back )CX35FU), nod-right upperarm (CX35FU)   Basal cell carcinoma 06/08/2016   sup-Left upper back (CX35FU), mid back (CX35FU), right lower back (CX35FU), nod-Right upperarm (CX35FU)   Cancer (HCC)    skin - basil cell   Depression    Diabetes mellitus without complication (HCC)    Dysrhythmia    a-fib   GERD (gastroesophageal reflux disease)    Hyperlipidemia    Hypertension    Neuropathy    Obesity    Paroxysmal atrial fibrillation ([place])    Peripheral vascular disease ([place])    diabetic neuropathy in both feet   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   in situ left forearm posterior tx after biopsy    Sleep apnea    uses C-pap machine   Squamous cell carcinoma of skin 02/11/2013   in situ-Right temple (CX35FU)   Squamous cell carcinoma of skin 08/20/2008   in situ- front scalp (CX35FU)   Squamous cell carcinoma of skin 06/08/2016   in situ-front scalp (CX35FU)   Squamous cell carcinoma of skin 02/05/2019   in situ-right sideburn-sup (CX35FU), in situ-right sideburn,inf (CX35FU)   Stroke ([place])    08/09/2017    Past Surgical History:  Procedure Laterality Date   APPENDECTOMY  1962   BACK SURGERY  00-02-12   x3   BASAL CELL CARCINOMA EXCISION  93/06/10   COLONOSCOPY     KNEE ARTHROSCOPY  005/01/02   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   Procedure: TOTAL LEFT KNEE ARTHROPLASTY;  Surgeon: [name], MD;  Location: [facility];  Service: Orthopedics;  Laterality: Left;   TOTAL KNEE ARTHROPLASTY Right 10/08/2018   Procedure: RIGHT TOTAL KNEE ARTHROPLASTY;  Surgeon: [name], MD;  Location: [facility];  Service: Orthopedics;  Laterality: Right;  67min     Current Outpatient Medications  Medication Sig Dispense Refill   ALPRAZolam (XANAX) 0.5 MG tablet Take  0.5 mg by mouth 2 (two) times daily.      atorvastatin (LIPITOR) 80 MG tablet Take 1 tablet (80 mg total) by mouth daily at 6 PM. 30 tablet 0   benazepril (LOTENSIN) 40 MG tablet Take 1 tablet (40 mg total) by mouth daily.     ELIQUIS 5 MG TABS tablet Take 1 tablet by mouth twice daily 60 tablet 5   ergocalciferol (VITAMIN D2) 50000 units capsule Take 50,000 Units by mouth once a week.      gabapentin (NEURONTIN) 300 MG capsule Take 2 capsules (600 mg total) by mouth at bedtime. 180 capsule 3   hydroxyurea (HYDREA) 500 MG capsule TAKE 2 CAPSULES BY MOUTH IN THE MORNING AND 1 IN THE EVENING 270 capsule 0   insulin NPH-regular Human (NOVOLIN 70/30) (70-30) 100 UNIT/ML injection Inject 44 Units into the skin 2 (two) times daily with a meal.     metFORMIN (GLUCOPHAGE) 1000 MG tablet Take 500 mg by mouth 2 (two) times daily.     metoprolol tartrate (LOPRESSOR) 50 MG tablet Take 50 mg by mouth 2 (two) times daily.     Multiple Vitamin (MULTIVITAMIN WITH MINERALS) TABS tablet Take 1 tablet by mouth daily.     omeprazole (PRILOSEC) 20 MG capsule Take 20 mg by mouth daily.     torsemide (DEMADEX) 20 MG tablet Take 2 tablets (40 mg total) by mouth daily. 180 tablet 3   venlafaxine XR (EFFEXOR-XR) 75 MG 24 hr capsule Take 75 mg by mouth daily.     No current facility-administered medications for this visit.    Allergies:   Patient has no known allergies.    Social History:  The patient  reports that he quit smoking about 27 years ago. His smoking use included cigars. He has a 30.00 pack-year smoking history. He has never used smokeless tobacco. He reports current alcohol use. He reports that he does not use drugs.   Family History:  The patient's ***family history includes CVA in his father; Cancer in his mother; Diabetes Mellitus II in his mother; Heart failure in his father; Hypertension in his mother and sister.    ROS:  Please see the history of present illness.   Otherwise, review of  systems are positive for {NONE DEFAULTED:18576::"none"}.   All other systems are reviewed and negative.    PHYSICAL EXAM: VS:  There were no vitals taken for this visit. , BMI There is no height or weight on file to calculate BMI. GEN: Well nourished, well developed, in no acute distress HEENT: normal Neck: no JVD, carotid bruits, or masses Cardiac: ***RRR; no murmurs, rubs, or gallops,no edema  Respiratory:  clear to auscultation bilaterally, normal work of breathing GI: soft, nontender, nondistended, + BS MS: no deformity or atrophy Skin: warm and dry, no rash Neuro:  Strength and sensation are intact Psych: euthymic mood, full affect   EKG:  EKG {ACTION; IS/IS QBV:69450388} ordered today. The ekg ordered today demonstrates ***   Recent Labs: 07/04/2019: ALT 27 07/31/2019: Hemoglobin 11.1; Platelets 436 10/18/2019: BUN 21; Creatinine, Ser 1.16; Potassium 5.4; Sodium 142    Lipid Panel No results found for: CHOL, TRIG, HDL, CHOLHDL, VLDL, LDLCALC, LDLDIRECT    Wt Readings from Last 3 Encounters:  11/28/19 298 lb (135.2 kg)  08/28/19 299 lb 3.2 oz (135.7 kg)  08/12/19 300 lb (136.1 kg)      Other studies Reviewed: Additional studies/ records that were reviewed today include: ***. Review of the above records demonstrates: ***  ZIO monitor 07/08/2019:   Basic rhythm is NSR  PAF and Paroxysmal atrial flutter noted.  AF/AFl burden 34%. Longest episode 11 hours. Fastest rate 140 bpm  Post conversion pauses up to 3.4 seconds with persistebt bradycardia < 35 bpm  Rare PVC's  Echocardiogram 06/03/2019:   1. Left ventricular ejection fraction, by visual estimation, is 60 to  65%. The left ventricle has normal function. Normal left ventricular size.  There is mildly increased left ventricular hypertrophy.  2. The tricuspid valve is normal in structure. Tricuspid valve  regurgitation is trivial.  3. The aortic valve is tricuspid Aortic valve regurgitation was not    visualized by color flow Doppler. Mild aortic valve sclerosis without  stenosis.  4. Left ventricular diastolic Doppler parameters are consistent with  pseudonormalization pattern of LV diastolic filling.  5. Global right ventricle has normal systolic function.The right  ventricular size is mildly enlarged. No increase in right ventricular wall  thickness.  6. Left atrial size was mildly dilated.  7. Right atrial size was moderately dilated.  8. The mitral valve is normal in structure. No evidence of mitral valve  regurgitation. No evidence of mitral stenosis.  9. The inferior vena cava is normal in size with greater than 50%  respiratory variability, suggesting right atrial pressure of 3 mmHg.  10. The tricuspid regurgitant velocity is 2.08 m/s, and with an assumed  right atrial pressure of 3 mmHg, the estimated right ventricular systolic  pressure is severely elevated at 20.3 mmHg.   ASSESSMENT AND PLAN:  1.  ***   Current medicines are reviewed at length with the patient today.  The patient {ACTIONS; HAS/DOES NOT HAVE:19233} concerns regarding medicines.  The following changes have been made:  {PLAN; NO CHANGE:13088:s}  Labs/ tests ordered today include: *** No orders of the defined types were placed in this encounter.    Disposition:   FU with *** in {gen number 8-28:003491} {Days to years:10300}  Signed, Kathyrn Drown, NP  06/23/2020 8:24 AM    [address], [place], [place]  [address] Phone: [phone]; Fax: [phone]

## 2020-06-24 NOTE — Progress Notes (Signed)
[place]  Telephone:(336) [phone] Fax:(336) [phone]     ID: [name] DOB: Apr 11, 1941  MR#: [id number]  AJO#:[id number]  Patient Care Team: Charles Bowen, MD as PCP - General (Endocrinology) Charles Crome, MD as PCP - Cardiology (Cardiology) [name], Charles Dad, MD as Consulting Physician (Oncology) Charles Shipper, MD as Consulting Physician (Gastroenterology) Charles Monarch, MD as Consulting Physician (Dermatology) Charles Cluck, MD as Referring Physician (Ophthalmology) Charles Fila, MD as Consulting Physician (Neurology) Charles Cruel, MD OTHER MD:  CHIEF COMPLAINT: Essential thrombocytosis  CURRENT TREATMENT:  hydroxyurea   INTERVAL HISTORY: [name] returns today for his yearly follow-up in the treatment of his essential thrombocytosis.   He continues on hydroxyurea.  His current dose is 1000 mg in the morning and 500 in the evening.  He tolerates this with no side effects that he is aware of.  It is not financially a burden to him.   REVIEW OF SYSTEMS: [name] developed a staph infection in the left elbow.  She is treated with doxycycline for this per his account and it is better.  He has been very constipated for the past 2 weeks although he has been taking fiber every day for the last 5 years and he has been using some over-the-counter stool softeners and some laxatives.  There is no blood in the stool, no abdominal distention or pains or cramps that he is aware of.  His last colonoscopy on, under [name], was about 2 years ago he says.  He tells me he drinks about 3 or 4 glasses of water a day.  A second problem he is having is urinary incontinence.  This is not new but it is a bit worse.  He received the [place] vaccine x2 with no complications.  Aside from that a detailed review of systems today was stable   HISTORY OF CURRENT ILLNESS: From the original intake note:  Dr. Forde Hall obtained lab work on [name] 09/25/2017 showing a  platelet count of 1,035,000.  He referred him at that point for further evaluation and sent [place] his prior lab work.  The earliest platelet count I have is from 03/18/2013.  It was normal then at 263,000.  On 07/12/2015 the platelet count was 559,000.  On 08/10/2016 the platelet count was 725,000.  On 08/09/2017 the platelet count was 881,000  Prior to today's visit I se upt [name] for some additional lab work.  This was performed 09/29/2017.  On that date his white cell count was 10.4, with the absolute neutrophil count 8.0.  Hemoglobin was 14.0 with an MCV of 86.7.  The platelet count was 895,000  The labs obtained on that date included a ferritin of 103, iron saturation of 76%, C-reactive protein less than 0.8, LDH 209 and BCR/ABL not detected.  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  The patient's subsequent history is as detailed below.   PAST MEDICAL HISTORY: Past Medical History:  Diagnosis Date  . Anxiety   . Arthritis   . Basal cell carcinoma 07/18/1991   Left nasal brdige (MOHS)  . Basal cell carcinoma 01/23/1992   lower right back-(CX35FU)  . Basal cell carcinoma 05/20/2003   sup-left back (CX35FU)  . Basal cell carcinoma 07/28/2011   post lower neck  . Basal cell carcinoma 08/20/2008   right sideburn(MOHS), sup-Left upper back (CX35FU), sup-mid back (CX35FU), nod-Right lower back )CX35FU), nod-right  upperarm (CX35FU)  . Basal cell carcinoma 06/08/2016   sup-Left upper back (CX35FU), mid back (CX35FU), right lower back (CX35FU), nod-Right upperarm (CX35FU)  . Cancer (HCC)    skin - basil cell  . Depression   . Diabetes mellitus without complication ([place])   . Dysrhythmia    a-fib  . GERD (gastroesophageal reflux disease)   . Hyperlipidemia   . Hypertension   . Neuropathy   . Obesity   . Paroxysmal atrial fibrillation (HCC)   . Peripheral vascular  disease ([place])    diabetic neuropathy in both feet  . 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   in situ left forearm posterior tx after biopsy   . Sleep apnea    uses C-pap machine  . Squamous cell carcinoma of skin 02/11/2013   in situ-Right temple (CX35FU)  . Squamous cell carcinoma of skin 08/20/2008   in situ- front scalp (CX35FU)  . Squamous cell carcinoma of skin 06/08/2016   in situ-front scalp (CX35FU)  . Squamous cell carcinoma of skin 02/05/2019   in situ-right sideburn-sup (CX35FU), in situ-right sideburn,inf (CX35FU)  . Stroke [facility])    08/09/2017    PAST SURGICAL HISTORY: Past Surgical History:  Procedure Laterality Date  . APPENDECTOMY  1962  . BACK SURGERY  00-02-12   x3  . BASAL CELL CARCINOMA EXCISION  93/06/10  . COLONOSCOPY    . KNEE ARTHROSCOPY  005/01/02  . 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   Procedure: TOTAL LEFT KNEE ARTHROPLASTY;  Surgeon: Charles Arabian, MD;  [facility];  Service: Orthopedics;  Laterality: Left;  . TOTAL KNEE ARTHROPLASTY Right 10/08/2018   Procedure: RIGHT TOTAL KNEE ARTHROPLASTY;  Surgeon: Charles Arabian, MD;  Location: [facility];  Service: Orthopedics;  Laterality: Right;  54mn    FAMILY HISTORY Family History  Problem Relation Age of Onset  . Cancer Mother   . Diabetes Mellitus II Mother   . Hypertension Mother   . Heart failure Father   . CVA Father   . Hypertension Sister   . Colon cancer Neg Hx   Patient's father died at age 27630shortly following a CABG.  The patient's mother died at age 27650from cancer but the patient does not know what type.  The patient has 3 brothers, 1 sister.  The sister died at age 2749following a stroke.  One brother had heart disease but is now doing well.   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  He has 2 children from his first marriage, [name] who is a GMusic therapistand a med tDesigner, multimediain TLexington 52, and [name] who  works for the [employer] [place] [employer]  The patient has 4 biological grandchildren.  His second wife, [name] has 2 children of her own, SHilliard Hall 343 who is in [place] works as a pHigher education careers adviserat [employer]Max Hall, who is a pManagement consultantin [employer]  The patient is not a church attender    ADVANCED DIRECTIVES:    HEALTH MAINTENANCE: Social History   Tobacco Use  . Smoking status: Former Smoker    Packs/day: 1.00    Years: 30.00    Pack years: 30.00    Types: Cigars    Quit date: 08/02/1992    Years since quitting: 27.9  . Smokeless tobacco: Never Used  Vaping Use  . Vaping Use: Never used  Substance Use Topics  . Alcohol use:  Yes    Alcohol/week: 0.0 standard drinks    Comment: 1 beer a night.   . Drug use: No     Colonoscopy: 05/12/2015  Bone density:  PSA:   No Known Allergies  Current Outpatient Medications  Medication Sig Dispense Refill  . ALPRAZolam (XANAX) 0.5 MG tablet Take 0.5 mg by mouth 2 (two) times daily.     [name] Kitchen atorvastatin (LIPITOR) 80 MG tablet Take 1 tablet (80 mg total) by mouth daily at 6 PM. 30 tablet 0  . benazepril (LOTENSIN) 40 MG tablet Take 1 tablet (40 mg total) by mouth daily.    [name] Kitchen ELIQUIS 5 MG TABS tablet Take 1 tablet by mouth twice daily 60 tablet 5  . ergocalciferol (VITAMIN D2) 50000 units capsule Take 50,000 Units by mouth once a week.     . gabapentin (NEURONTIN) 300 MG capsule Take 2 capsules (600 mg total) by mouth at bedtime. 180 capsule 3  . hydroxyurea (HYDREA) 500 MG capsule TAKE 2 CAPSULES BY MOUTH IN THE MORNING AND 1 IN THE EVENING 270 capsule 0  . insulin NPH-regular Human (NOVOLIN 70/30) (70-30) 100 UNIT/ML injection Inject Hall Units into the skin 2 (two) times daily with a meal.    . metFORMIN (GLUCOPHAGE) 1000 MG tablet Take 500 mg by mouth 2 (two) times daily.    . metoprolol tartrate (LOPRESSOR) 50 MG tablet Take 50 mg by mouth 2 (two) times daily.    . Multiple Vitamin (MULTIVITAMIN WITH  MINERALS) TABS tablet Take 1 tablet by mouth daily.    [name] Kitchen omeprazole (PRILOSEC) 20 MG capsule Take 20 mg by mouth daily.    [name] Kitchen torsemide (DEMADEX) 20 MG tablet Take 2 tablets (40 mg total) by mouth daily. 180 tablet 3  . venlafaxine XR (EFFEXOR-XR) 75 MG 24 hr capsule Take 75 mg by mouth daily.     No current facility-administered medications for this visit.    OBJECTIVE: White man who appears older than stated age  70:   06/25/20 1145  BP: 125/68  Pulse: (!) 124  Resp: 18  Temp: (!) 97.1 F (36.2 C)  SpO2: 95%   Wt Readings from Last 3 Encounters:  06/25/20 291 lb 1.6 oz (132 kg)  11/28/19 298 lb (135.2 kg)  08/28/19 299 lb 3.2 oz (135.7 kg)   Body mass index is 40.03 kg/m.    ECOG FS:2 - Symptomatic, <50% confined to bed  Sclerae unicteric, EOMs intact Wearing a mask No cervical or supraclavicular adenopathy Lungs no rales or rhonchi Heart regular rate and rhythm Abd soft, obese, nontender, positive bowel sounds MSK no focal spinal tenderness, left elbow lesion appears superficial with some residual inflammation but no evidence of active infection Neuro: nonfocal, well oriented, appropriate affect   LAB RESULTS:  CMP     Component Value Date/Time   NA 139 06/25/2020 1130   NA 142 10/18/2019 1537   K 4.4 06/25/2020 1130   CL 101 06/25/2020 1130   CO2 33 (H) 06/25/2020 1130   GLUCOSE 199 (H) 06/25/2020 1130   BUN 19 06/25/2020 1130   BUN 21 10/18/2019 1537   CREATININE 1.14 06/25/2020 1130   CALCIUM 9.4 06/25/2020 1130   PROT 6.6 06/25/2020 1130   PROT 6.2 07/04/2019 1351   ALBUMIN 3.3 (L) 06/25/2020 1130   ALBUMIN 3.7 07/04/2019 1351   AST 21 06/25/2020 1130   ALT 24 06/25/2020 1130   ALKPHOS 108 06/25/2020 1130   BILITOT 0.7 06/25/2020 1130   GFRNONAA >60 06/25/2020 1130  GFRAA 70 10/18/2019 1537   GFRAA >60 09/26/2018 1148    Lab Results  Component Value Date   WBC 4.4 06/25/2020   NEUTROABS 2.7 06/25/2020   HGB 11.8 (L) 06/25/2020   HCT  33.4 (L) 06/25/2020   MCV 118.9 (H) 06/25/2020   PLT 348 06/25/2020   No results found for: LABCA2  No components found for: [id number]  No results for input(s): INR in the last 168 hours.  No results found for: LABCA2  No results found for: DDU202  No results found for: RKY706  No results found for: CBJ628  No results found for: CA2729  No components found for: HGQUANT  No results found for: CEA1 / No results found for: CEA1   No results found for: AFPTUMOR  No results found for: CHROMOGRNA  No results found for: TOTALPROTELP, ALBUMINELP, A1GS, A2GS, BETS, BETA2SER, GAMS, MSPIKE, SPEI (this displays SPEP labs)  No results found for: KPAFRELGTCHN, LAMBDASER, KAPLAMBRATIO (kappa/lambda light chains)  No results found for: HGBA, HGBA2QUANT, HGBFQUANT, HGBSQUAN (Hemoglobinopathy evaluation)   Lab Results  Component Value Date   LDH 209 09/29/2017    Lab Results  Component Value Date   IRON 166 (H) 09/29/2017   TIBC 219 09/29/2017   IRONPCTSAT 76 09/29/2017   (Iron and TIBC)  Lab Results  Component Value Date   FERRITIN 143 09/29/2017    Urinalysis    Component Value Date/Time   COLORURINE YELLOW 10/13/2018 2034   APPEARANCEUR CLEAR 10/13/2018 2034   LABSPEC 1.012 10/13/2018 2034   PHURINE 7.0 10/13/2018 2034   GLUCOSEU >=500 (A) 10/13/2018 2034   [place] 10/13/2018 2034   [place] 10/13/2018 2034   [place] 10/13/2018 2034   PROTEINUR NEGATIVE 10/13/2018 2034   NITRITE NEGATIVE 10/13/2018 2034   LEUKOCYTESUR NEGATIVE 10/13/2018 2034    STUDIES: No results found.     ELIGIBLE FOR AVAILABLE RESEARCH PROTOCOL: no  ASSESSMENT: 79 y.o. [place] Man with a platelet count in excess of 1 million 09/25/2017, a positive Jak2 V617F mutation, and normal iron studies, normal C-reactive protein, and negative BCR-ABL by PCR  (1) essential thrombocytosis: Started on Hydrea 1000 mg a day 10/06/2017  (a) Hydrea dose increased to  1500 mg daily beginning 06/27/2019  (2) bone marrow biopsy discussed, postponed  (3) 0.7 cm right upper lung nodule incidentally noted on neck CT scans November 2018  (a) no change on repeat CT of the chest with contrast 02/28/2018   PLAN: Tao is now nearly 3 years out from initial diagnosis of essential thrombocytosis.  His platelet count is very well controlled on Hydrea and this is not causing significant cytopenias otherwise.  As far as his urinary incontinence is concerned I suggested he void more frequently and do some training of his sphincters.  I am more concerned about the constipation issue.  I suggested he try MiraLAX daily, stool softeners 2 tablets twice daily, and any laxative day 3 if he has no bowel movement, but he really needs to avoid hard bowel movements since he is taking a blood thinner.  If these simple measures do not take care of the problem he may want to get back to GI and find out why he is so backed up  Otherwise regular continue to check his blood count every 4 months and he will see me again in 1 year  Total encounter time 25 minutes.*  Anushree Dorsi, Charles Dad, MD  06/25/20 5:43 PM Medical Oncology and Hematology [facility] [address], [place] [address] Tel. [phone]    Fax. [phone]   I, Wilburn Mylar, am acting as scribe for Dr. Virgie Hall. Vallie Teters.  I, Lurline Del MD, have reviewed the above documentation for accuracy and completeness, and I agree with the above.    *Total Encounter Time as defined by the Centers for Medicare and Medicaid Services includes, in addition to the face-to-face time of a patient visit (documented in the note above) non-face-to-face time: obtaining and reviewing outside history, ordering and reviewing medications, tests or procedures, care coordination (communications with other health care professionals or caregivers) and documentation in the medical record.

## 2020-06-25 ENCOUNTER — Inpatient Hospital Stay: Payer: Medicare HMO

## 2020-06-25 ENCOUNTER — Other Ambulatory Visit: Payer: Self-pay

## 2020-06-25 ENCOUNTER — Inpatient Hospital Stay: Payer: Medicare HMO | Attending: Oncology | Admitting: Oncology

## 2020-06-25 VITALS — BP 125/68 | HR 124 | Temp 97.1°F | Resp 18 | Ht 71.5 in | Wt 291.1 lb

## 2020-06-25 DIAGNOSIS — Z85828 Personal history of other malignant neoplasm of skin: Secondary | ICD-10-CM | POA: Diagnosis not present

## 2020-06-25 DIAGNOSIS — Z9049 Acquired absence of other specified parts of digestive tract: Secondary | ICD-10-CM | POA: Insufficient documentation

## 2020-06-25 DIAGNOSIS — Z96653 Presence of artificial knee joint, bilateral: Secondary | ICD-10-CM | POA: Insufficient documentation

## 2020-06-25 DIAGNOSIS — Z87891 Personal history of nicotine dependence: Secondary | ICD-10-CM | POA: Diagnosis not present

## 2020-06-25 DIAGNOSIS — F329 Major depressive disorder, single episode, unspecified: Secondary | ICD-10-CM | POA: Insufficient documentation

## 2020-06-25 DIAGNOSIS — Z833 Family history of diabetes mellitus: Secondary | ICD-10-CM | POA: Insufficient documentation

## 2020-06-25 DIAGNOSIS — Z8249 Family history of ischemic heart disease and other diseases of the circulatory system: Secondary | ICD-10-CM | POA: Insufficient documentation

## 2020-06-25 DIAGNOSIS — E785 Hyperlipidemia, unspecified: Secondary | ICD-10-CM | POA: Diagnosis not present

## 2020-06-25 DIAGNOSIS — I480 Paroxysmal atrial fibrillation: Secondary | ICD-10-CM | POA: Insufficient documentation

## 2020-06-25 DIAGNOSIS — G4730 Sleep apnea, unspecified: Secondary | ICD-10-CM | POA: Insufficient documentation

## 2020-06-25 DIAGNOSIS — E119 Type 2 diabetes mellitus without complications: Secondary | ICD-10-CM

## 2020-06-25 DIAGNOSIS — Z7901 Long term (current) use of anticoagulants: Secondary | ICD-10-CM | POA: Insufficient documentation

## 2020-06-25 DIAGNOSIS — D473 Essential (hemorrhagic) thrombocythemia: Secondary | ICD-10-CM

## 2020-06-25 DIAGNOSIS — E08311 Diabetes mellitus due to underlying condition with unspecified diabetic retinopathy with macular edema: Secondary | ICD-10-CM

## 2020-06-25 DIAGNOSIS — Z8673 Personal history of transient ischemic attack (TIA), and cerebral infarction without residual deficits: Secondary | ICD-10-CM | POA: Diagnosis not present

## 2020-06-25 DIAGNOSIS — E669 Obesity, unspecified: Secondary | ICD-10-CM | POA: Diagnosis not present

## 2020-06-25 DIAGNOSIS — I495 Sick sinus syndrome: Secondary | ICD-10-CM

## 2020-06-25 DIAGNOSIS — D689 Coagulation defect, unspecified: Secondary | ICD-10-CM

## 2020-06-25 DIAGNOSIS — Z794 Long term (current) use of insulin: Secondary | ICD-10-CM | POA: Diagnosis not present

## 2020-06-25 DIAGNOSIS — I10 Essential (primary) hypertension: Secondary | ICD-10-CM | POA: Diagnosis not present

## 2020-06-25 DIAGNOSIS — F419 Anxiety disorder, unspecified: Secondary | ICD-10-CM | POA: Diagnosis not present

## 2020-06-25 DIAGNOSIS — I5032 Chronic diastolic (congestive) heart failure: Secondary | ICD-10-CM

## 2020-06-25 DIAGNOSIS — R69 Illness, unspecified: Secondary | ICD-10-CM | POA: Diagnosis not present

## 2020-06-25 DIAGNOSIS — Z79899 Other long term (current) drug therapy: Secondary | ICD-10-CM | POA: Insufficient documentation

## 2020-06-25 DIAGNOSIS — I63512 Cerebral infarction due to unspecified occlusion or stenosis of left middle cerebral artery: Secondary | ICD-10-CM

## 2020-06-25 DIAGNOSIS — K219 Gastro-esophageal reflux disease without esophagitis: Secondary | ICD-10-CM | POA: Diagnosis not present

## 2020-06-25 DIAGNOSIS — E0842 Diabetes mellitus due to underlying condition with diabetic polyneuropathy: Secondary | ICD-10-CM

## 2020-06-25 LAB — CMP (CANCER CENTER ONLY)
ALT: 24 U/L (ref 0–44)
AST: 21 U/L (ref 15–41)
Albumin: 3.3 g/dL — ABNORMAL LOW (ref 3.5–5.0)
Alkaline Phosphatase: 108 U/L (ref 38–126)
Anion gap: 5 (ref 5–15)
BUN: 19 mg/dL (ref 8–23)
CO2: 33 mmol/L — ABNORMAL HIGH (ref 22–32)
Calcium: 9.4 mg/dL (ref 8.9–10.3)
Chloride: 101 mmol/L (ref 98–111)
Creatinine: 1.14 mg/dL (ref 0.61–1.24)
GFR, Estimated: >60 mL/min (ref >60)
Glucose, Bld: 199 mg/dL — ABNORMAL HIGH (ref 70–99)
Potassium: 4.4 mmol/L (ref 3.5–5.1)
Sodium: 139 mmol/L (ref 135–145)
Total Bilirubin: 0.7 mg/dL (ref 0.3–1.2)
Total Protein: 6.6 g/dL (ref 6.5–8.1)

## 2020-06-25 LAB — CBC WITH DIFFERENTIAL/PLATELET
Abs Immature Granulocytes: 0.02 10*3/uL (ref 0.00–0.07)
Basophils Absolute: 0 10*3/uL (ref 0.0–0.1)
Basophils Relative: 1 %
Eosinophils Absolute: 0.1 10*3/uL (ref 0.0–0.5)
Eosinophils Relative: 1 %
HCT: 33.4 % — ABNORMAL LOW (ref 39.0–52.0)
Hemoglobin: 11.8 g/dL — ABNORMAL LOW (ref 13.0–17.0)
Immature Granulocytes: 1 %
Lymphocytes Relative: 29 %
Lymphs Abs: 1.3 10*3/uL (ref 0.7–4.0)
MCH: 42 pg — ABNORMAL HIGH (ref 26.0–34.0)
MCHC: 35.3 g/dL (ref 30.0–36.0)
MCV: 118.9 fL — ABNORMAL HIGH (ref 80.0–100.0)
Monocytes Absolute: 0.3 10*3/uL (ref 0.1–1.0)
Monocytes Relative: 6 %
Neutro Abs: 2.7 10*3/uL (ref 1.7–7.7)
Neutrophils Relative %: 62 %
Platelets: 348 10*3/uL (ref 150–400)
RBC: 2.81 MIL/uL — ABNORMAL LOW (ref 4.22–5.81)
RDW: 13.1 % (ref 11.5–15.5)
WBC: 4.4 10*3/uL (ref 4.0–10.5)
nRBC: 0 % (ref 0.0–0.2)

## 2020-06-25 MED ORDER — HYDROXYUREA 500 MG PO CAPS: ORAL_CAPSULE | ORAL | 4 refills | Status: DC

## 2020-06-30 DIAGNOSIS — H25812 Combined forms of age-related cataract, left eye: Secondary | ICD-10-CM | POA: Diagnosis not present

## 2020-06-30 DIAGNOSIS — H2512 Age-related nuclear cataract, left eye: Secondary | ICD-10-CM | POA: Diagnosis not present

## 2020-06-30 DIAGNOSIS — H25042 Posterior subcapsular polar age-related cataract, left eye: Secondary | ICD-10-CM | POA: Diagnosis not present

## 2020-06-30 DIAGNOSIS — H25012 Cortical age-related cataract, left eye: Secondary | ICD-10-CM | POA: Diagnosis not present

## 2020-07-01 ENCOUNTER — Ambulatory Visit: Payer: Medicare HMO | Admitting: Cardiology

## 2020-07-02 DIAGNOSIS — R69 Illness, unspecified: Secondary | ICD-10-CM | POA: Diagnosis not present

## 2020-07-25 NOTE — Progress Notes (Signed)
Cardiology Office Note   Date:  07/28/2020   ID:  [name], DOB 1940/09/15, MRN [id number]  PCP:  Charles Bowen, MD  Cardiologist: Dr. Tamala Julian, MD   Chief Complaint  Patient presents with  . Follow-up    History of Present Illness: [name] is a 79 y.o. male who presents for follow up, seen for Dr. Tamala Julian.  [name] has a history of paroxysmal atrial fibrillation, chronic diastolic CHF, CVA, OSA, HTN, DM2, PAD, PVD and HLD.  He previously wore long-term ZIO monitor which showed PAF and paroxysmal atrial flutter with a burden at 34%, also with postconversion pauses up to 3.4 seconds with persistent bradycardia and rare PVCs.  He was then referred to Dr. Rayann Heman and felt not to be a candidate for AF ablation and and not a PPM candidate.Tikosyn was discussed however was never started.  He was most recently seen by Dr. Tamala Julian 11/28/2019 at which time he was noted to be doing fairly well.  He was on an exercise program with reported improvement in his endurance.  He had no chest pain.  He was asymptomatic with his AF.  Intolerant to CPAP.  Today he presents for follow-up and reports doing well other than more recent problems with constipation and lower extremity edema.  EKG today shows atrial fibrillation/flutter with rate control at 62 bpm.  He has no sensation of palpitations indicating that he has been in atrial fibrillation.  He states he is compliant with his medications including Eliquis, Lopressor and torsemide 40 mg daily.  He has more recently noticed problems with lower extremity edema despite following a low-sodium diet.  He will at times wear compression stockings and elevate his legs however this is not every day.  Does not elevate his legs while watching television.  I suspect fluid volume issues may be secondary to atrial fibrillation.  He was seen by Dr. Rayann Heman as above in the past not felt to be a candidate for AF ablation.  Appears antiarrhythmics were discussed but  were not started.  We will send copy of chart to Dr. Tamala Julian for review as he may need to be seen by EP once again for antiarrhythmic consideration.  He denies chest pain, palpitations, shortness of breath, dizziness, presyncopal or syncopal episodes.  He has no orthopnea.  Past Medical History:  Diagnosis Date  . Anxiety   . Arthritis   . Basal cell carcinoma 07/18/1991   Left nasal brdige (MOHS)  . Basal cell carcinoma 01/23/1992   lower right back-(CX35FU)  . Basal cell carcinoma 05/20/2003   sup-left back (CX35FU)  . Basal cell carcinoma 07/28/2011   post lower neck  . Basal cell carcinoma 08/20/2008   right sideburn(MOHS), sup-Left upper back (CX35FU), sup-mid back (CX35FU), nod-Right lower back )CX35FU), nod-right upperarm (CX35FU)  . Basal cell carcinoma 06/08/2016   sup-Left upper back (CX35FU), mid back (CX35FU), right lower back (CX35FU), nod-Right upperarm (CX35FU)  . Cancer (HCC)    skin - basil cell  . Depression   . Diabetes mellitus without complication ([place])   . Dysrhythmia    a-fib  . GERD (gastroesophageal reflux disease)   . Hyperlipidemia   . Hypertension   . Neuropathy   . Obesity   . Paroxysmal atrial fibrillation (HCC)   . Peripheral vascular disease ([place])    diabetic neuropathy in both feet  . 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   in situ left forearm posterior tx after biopsy   .  Sleep apnea    uses C-pap machine  . Squamous cell carcinoma of skin 02/11/2013   in situ-Right temple (CX35FU)  . Squamous cell carcinoma of skin 08/20/2008   in situ- front scalp (CX35FU)  . Squamous cell carcinoma of skin 06/08/2016   in situ-front scalp (CX35FU)  . Squamous cell carcinoma of skin 02/05/2019   in situ-right sideburn-sup (CX35FU), in situ-right sideburn,inf (CX35FU)  . Stroke [facility])    08/09/2017    Past Surgical History:  Procedure Laterality Date  . APPENDECTOMY  1962  . BACK SURGERY  00-02-12   x3  . BASAL CELL CARCINOMA EXCISION   93/06/10  . COLONOSCOPY    . KNEE ARTHROSCOPY  005/01/02  . 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 TOTAL KNEE ARTHROPLASTY Right 10/08/2018   Procedure: RIGHT TOTAL KNEE ARTHROPLASTY;  Surgeon: [name], MD;  Location: [facility];  Service: Orthopedics;  Laterality: Right;  76min     Current Outpatient Medications  Medication Sig Dispense Refill  . ALPRAZolam (XANAX) 0.5 MG tablet Take 0.5 mg by mouth 2 (two) times daily.     [name] Kitchen atorvastatin (LIPITOR) 80 MG tablet Take 1 tablet (80 mg total) by mouth daily at 6 PM. 30 tablet 0  . benazepril (LOTENSIN) 40 MG tablet Take 1 tablet (40 mg total) by mouth daily.    [name] Kitchen ELIQUIS 5 MG TABS tablet Take 1 tablet by mouth twice daily 60 tablet 5  . ergocalciferol (VITAMIN D2) 50000 units capsule Take 50,000 Units by mouth once a week.     . gabapentin (NEURONTIN) 300 MG capsule Take 2 capsules (600 mg total) by mouth at bedtime. 180 capsule 3  . glucose blood (ONETOUCH ULTRA) test strip USE 1 STRIP TO CHECK GLUCOSE 4 TIMES DAILY    . hydroxyurea (HYDREA) 500 MG capsule TAKE 2 CAPSULES BY MOUTH IN THE MORNING AND 1 IN THE EVENING 270 capsule 4  . insulin NPH-regular Human (NOVOLIN 70/30) (70-30) 100 UNIT/ML injection Inject 44 Units into the skin 2 (two) times daily with a meal.    . levothyroxine (SYNTHROID) 75 MCG tablet TAKE 1 TABLET BY MOUTH IN THE MORNING ON AN EMPTY STOMACH 30 MINUTES BEFORE MEAL(S) AND OTHER MEDS    . metFORMIN (GLUCOPHAGE) 1000 MG tablet Take 500 mg by mouth 2 (two) times daily.    . metoprolol tartrate (LOPRESSOR) 50 MG tablet Take 50 mg by mouth 2 (two) times daily.    . Multiple Vitamin (MULTIVITAMIN WITH MINERALS) TABS tablet Take 1 tablet by mouth daily.    [name] Kitchen omeprazole (PRILOSEC) 20 MG capsule Take 20 mg by mouth daily.    Glory Rosebush VERIO test strip SMARTSIG:Via Meter    . prednisoLONE  acetate (PRED FORTE) 1 % ophthalmic suspension Place 1 drop into the left eye 4 times daily. Start 3 hours AFTER you arrive home from surgery.    [name] Kitchen venlafaxine XR (EFFEXOR-XR) 75 MG 24 hr capsule Take 75 mg by mouth daily.    [name] Kitchen torsemide (DEMADEX) 20 MG tablet Take 3 tablets (60 mg total) by mouth daily. 270 tablet 3   No current facility-administered medications for this visit.    Allergies:   Patient has no known allergies.    Social History:  The patient  reports that he quit smoking about 28 years ago. His smoking use included cigars. He has a 30.00 pack-year  smoking history. He has never used smokeless tobacco. He reports current alcohol use. He reports that he does not use drugs.   Family History:  The patient'sfamily history includes CVA in his father; Cancer in his mother; Diabetes Mellitus II in his mother; Heart failure in his father; Hypertension in his mother and sister.    ROS:  Please see the history of present illness. Otherwise, review of systems are positive for none.   All other systems are reviewed and negative.    PHYSICAL EXAM: VS:  BP 100/60   Pulse 62   Ht 5' 11.75" (1.822 m)   Wt 289 lb 3.2 oz (131.2 kg)   SpO2 97%   BMI 39.50 kg/m  , BMI Body mass index is 39.5 kg/m.   General: Well developed, well nourished, NAD Neck: Negative for carotid bruits. No JVD Lungs:Clear to ausculation bilaterally. No wheezes, rales, or rhonchi. Breathing is unlabored. Cardiovascular: Irregularly irregular. No murmurs Extremities: 2+ BLE edema.  Radial pulses 2+ bilaterally Neuro: Alert and oriented. No focal deficits. No facial asymmetry. MAE spontaneously. Psych: Responds to questions appropriately with normal affect.     EKG:  EKG is ordered today. The ekg ordered today demonstrates atrial fibrillation/flutter, HR 62 bpm   Recent Labs: 06/25/2020: ALT 24; BUN 19; Creatinine 1.14; Hemoglobin 11.8; Platelets 348; Potassium 4.4; Sodium 139    Lipid Panel No results  found for: CHOL, TRIG, HDL, CHOLHDL, VLDL, LDLCALC, LDLDIRECT    Wt Readings from Last 3 Encounters:  07/28/20 289 lb 3.2 oz (131.2 kg)  06/25/20 291 lb 1.6 oz (132 kg)  11/28/19 298 lb (135.2 kg)    Other studies Reviewed: Additional studies/ records that were reviewed today include:  Review of the above records demonstrates:  Long-term monitor October 2020: Study Highlights   Basic rhythm is NSR  PAF and Paroxysmal atrial flutter noted.  AF/AFl burden 34%. Longest episode 11 hours. Fastest rate 140 bpm  Post conversion pauses up to 3.4 seconds with persistebt bradycardia < 35 bpm  Rare PVC's   ASSESSMENT AND PLAN:  1.  Chronic diastolic CHF: -More recently having issues with LE edema despite following a low-sodium diet.  Has previously been maintained on torsemide 40 mg daily.  Will increase torsemide to 60 mg daily for now and follow closely for response. -Obtain BMET -EKG shows atrial fibrillation/flutter with rate control, likely contributing to more recent edema -See plan below  2.  PAF: -ZIO monitor from 06/06/2019 with NSR, PAF and paroxysmal atrial flutter with a AF burden of 34%.  Also had postconversion pauses up to 3.4 seconds with persistent bradycardia and rare PVCs.   -EKG today with rate controlled atrial fibrillation/flutter  -Has been compliant with Eliquis 5 mg twice daily and metoprolol 50 mg twice daily  -Previously saw Dr. Rayann Heman at which time he was not felt to be a candidate for AF ablation.  Appears Tikosyn was discussed however not pursued.  Echocardiogram from 06/03/2019 with mildly dilated LA and moderately dilated RA  -We will discuss case with Dr. Tamala Julian with possible referral back to EP to consider antiarrhythmic therapy  3.  Tachybradycardia syndrome: -Not felt to be a PPM candidate per EP  4.  OSA: -Intolerant to CPAP  5.  HTN: -Stable, 100/60 -Continue current regimen -No complaints of dizziness or presyncopal symptoms   Current  medicines are reviewed at length with the patient today.  The patient does not have concerns regarding medicines.  The following changes have been made: Increase  torsemide to 60 mg daily with close follow-up  Labs/ tests ordered today include: BMET today and in 1 week  Orders Placed This Encounter  Procedures  . Basic metabolic panel  . Basic metabolic panel  . EKG 12-Lead    Disposition:   FU with myself in 3 weeks  Signed, Kathyrn Drown, NP  07/28/2020 4:33 PM    [facility] [address], [place], [place]  [address] Phone: [phone]; Fax: [phone]

## 2020-07-28 ENCOUNTER — Other Ambulatory Visit: Payer: Self-pay

## 2020-07-28 ENCOUNTER — Ambulatory Visit: Payer: Medicare HMO | Admitting: Cardiology

## 2020-07-28 ENCOUNTER — Encounter: Payer: Self-pay | Admitting: Cardiology

## 2020-07-28 VITALS — BP 100/60 | HR 62 | Ht 71.75 in | Wt 289.2 lb

## 2020-07-28 DIAGNOSIS — Z7901 Long term (current) use of anticoagulants: Secondary | ICD-10-CM | POA: Diagnosis not present

## 2020-07-28 DIAGNOSIS — I480 Paroxysmal atrial fibrillation: Secondary | ICD-10-CM | POA: Diagnosis not present

## 2020-07-28 DIAGNOSIS — I5032 Chronic diastolic (congestive) heart failure: Secondary | ICD-10-CM | POA: Diagnosis not present

## 2020-07-28 DIAGNOSIS — I10 Essential (primary) hypertension: Secondary | ICD-10-CM

## 2020-07-28 DIAGNOSIS — E785 Hyperlipidemia, unspecified: Secondary | ICD-10-CM | POA: Diagnosis not present

## 2020-07-28 MED ORDER — TORSEMIDE 20 MG PO TABS: 60.0000 mg | ORAL_TABLET | Freq: Every day | ORAL | 3 refills | Status: DC

## 2020-07-28 NOTE — Patient Instructions (Signed)
Medication Instructions:  Your physician has recommended you make the following change in your medication:   1. INCREASE TORSEMIDE 60 MG DAILY.  *If you need a refill on your cardiac medications before your next appointment, please call your pharmacy*   Lab Work: TODAY: BMET  TO BE DONE IN 2 WEEK: BMET  If you have labs (blood work) drawn today and your tests are completely normal, you will receive your results only by: [name] Kitchen MyChart Message (if you have MyChart) OR . A paper copy in the mail If you have any lab test that is abnormal or we need to change your treatment, we will call you to review the results.   Testing/Procedures: NONE   Follow-Up: At [facility], you and your health needs are our priority.  As part of our continuing mission to provide you with exceptional heart care, we have created designated Provider Care Teams.  These Care Teams include your primary Cardiologist (physician) and Advanced Practice Providers (APPs -  Physician Assistants and Nurse Practitioners) who all work together to provide you with the care you need, when you need it.  We recommend signing up for the patient portal called "MyChart".  Sign up information is provided on this After Visit Summary.  MyChart is used to connect with patients for Virtual Visits (Telemedicine).  Patients are able to view lab/test results, encounter notes, upcoming appointments, etc.  Non-urgent messages can be sent to your provider as well.   To learn more about what you can do with MyChart, go to [email or web address].    Your next appointment:   2 week(s)  The format for your next appointment:   In Person  Provider:   You may see [name], MD or one of the following Advanced Practice Providers on your designated Care Team:    Truitt Merle, NP  Cecilie Kicks, NP  Kathyrn Drown, NP

## 2020-07-29 DIAGNOSIS — Z794 Long term (current) use of insulin: Secondary | ICD-10-CM | POA: Diagnosis not present

## 2020-07-29 DIAGNOSIS — E113591 Type 2 diabetes mellitus with proliferative diabetic retinopathy without macular edema, right eye: Secondary | ICD-10-CM | POA: Diagnosis not present

## 2020-07-29 DIAGNOSIS — H43813 Vitreous degeneration, bilateral: Secondary | ICD-10-CM | POA: Diagnosis not present

## 2020-07-29 DIAGNOSIS — E113552 Type 2 diabetes mellitus with stable proliferative diabetic retinopathy, left eye: Secondary | ICD-10-CM | POA: Diagnosis not present

## 2020-07-29 DIAGNOSIS — H35373 Puckering of macula, bilateral: Secondary | ICD-10-CM | POA: Diagnosis not present

## 2020-07-29 LAB — BASIC METABOLIC PANEL
BUN/Creatinine Ratio: 15 (ref 10–24)
BUN: 17 mg/dL (ref 8–27)
CO2: 28 mmol/L (ref 20–29)
Calcium: 8.9 mg/dL (ref 8.6–10.2)
Chloride: 98 mmol/L (ref 96–106)
Creatinine, Ser: 1.1 mg/dL (ref 0.76–1.27)
GFR calc Af Amer: 75 mL/min/{1.73_m2} (ref >59)
GFR calc non Af Amer: 65 mL/min/{1.73_m2} (ref >59)
Glucose: 260 mg/dL — ABNORMAL HIGH (ref 65–99)
Potassium: 5.5 mmol/L — ABNORMAL HIGH (ref 3.5–5.2)
Sodium: 137 mmol/L (ref 134–144)

## 2020-07-30 DIAGNOSIS — N1831 Chronic kidney disease, stage 3a: Secondary | ICD-10-CM | POA: Diagnosis not present

## 2020-07-30 DIAGNOSIS — E785 Hyperlipidemia, unspecified: Secondary | ICD-10-CM | POA: Diagnosis not present

## 2020-07-30 DIAGNOSIS — E113519 Type 2 diabetes mellitus with proliferative diabetic retinopathy with macular edema, unspecified eye: Secondary | ICD-10-CM | POA: Diagnosis not present

## 2020-07-30 DIAGNOSIS — E1140 Type 2 diabetes mellitus with diabetic neuropathy, unspecified: Secondary | ICD-10-CM | POA: Diagnosis not present

## 2020-07-30 DIAGNOSIS — E1142 Type 2 diabetes mellitus with diabetic polyneuropathy: Secondary | ICD-10-CM | POA: Diagnosis not present

## 2020-07-30 DIAGNOSIS — I5032 Chronic diastolic (congestive) heart failure: Secondary | ICD-10-CM | POA: Diagnosis not present

## 2020-07-30 DIAGNOSIS — D473 Essential (hemorrhagic) thrombocythemia: Secondary | ICD-10-CM | POA: Diagnosis not present

## 2020-07-30 DIAGNOSIS — I639 Cerebral infarction, unspecified: Secondary | ICD-10-CM | POA: Diagnosis not present

## 2020-07-30 DIAGNOSIS — I480 Paroxysmal atrial fibrillation: Secondary | ICD-10-CM | POA: Diagnosis not present

## 2020-08-05 NOTE — Progress Notes (Signed)
Cardiology Office Note   Date:  08/12/2020   ID:  [name], DOB 1941-04-13, MRN [id number]  PCP:  [name], MD  Cardiologist:  Dr. Tamala Julian, MD    Chief Complaint  Patient presents with   Follow-up    History of Present Illness: [name] is a 79 y.o. male who presents for follow up, seen for Dr. Tamala Julian.  [name] has a history of paroxysmal atrial fibrillation, chronic diastolic CHF, CVA, OSA, HTN, DM2, PAD, PVD and HLD.  He previously wore long-term ZIO monitor which showed PAF and paroxysmal atrial flutter with a burden at 34%, also with postconversion pauses up to 3.4 seconds with persistent bradycardia and rare PVCs.  He was then referred to Dr. Rayann Heman and felt not to be a candidate for AF ablation and and not a PPM candidate.Tikosyn was discussed however was never started.  He was most recently seen by Dr. Tamala Julian 11/28/2019 at which time he was noted to be doing fairly well.  He was on an exercise program with reported improvement in his endurance.  He had no chest pain.  He was asymptomatic with his AF.  Intolerant to CPAP.  In follow up with myself, he mainly had issues with constipation and lower extremity edema. EKG at that time showed atrial fibrillation/flutter with rate control at 62 bpm. He has no sensation of palpitations indicating that he has been in atrial fibrillation.  He stated he was compliant with his medications including Eliquis, Lopressor and torsemide 40 mg daily.  He had noticed problems with lower extremity edema despite following a low-sodium diet. Fluid volume issues felt to possibly be secondary to atrial fibrillation.  He was seen by Dr. Rayann Heman as above in the past not felt to be a candidate for AF ablation.  Appears antiarrhythmics were discussed but were not started.  Plan was to discuss with Dr. Tamala Julian and Dr. Rayann Heman. I have sent a message to Dr. Bonita Quin RN for OV for possible antiarrhythmic consideration.   Today he returns for follow  up. He was previously instructed to start an additional 20mg  Lasix in the evening to help with fluid overload however he states that he never started this. He continues to have mild LE edema without SOB or orthopnea. He also reports he did not start with an exercise regimen as he spoke about. He continues to be asymptomatic with AF. Long discussion regarding options. I would like for him to discuss any potential options for treatment with Dr. Rayann Heman. It may be that given no symptoms and great rate control, he may remain chronically in AF. If that is the case, would opt to perform repeat echo to assess LV function. He denies chest pain, SOB, palpitations, dizziness or syncope. He also has unrelated complaitns of knee pain. He has had bilateral knee replacements and states he will speak to his ortho doc about this. I have recommended that he try some stretching and possibly Tylenol for now.   Past Medical History:  Diagnosis Date   Anxiety    Arthritis    Basal cell carcinoma 07/18/1991   Left nasal brdige (MOHS)   Basal cell carcinoma 01/23/1992   lower right back-(CX35FU)   Basal cell carcinoma 05/20/2003   sup-left back (CX35FU)   Basal cell carcinoma 07/28/2011   post lower neck   Basal cell carcinoma 08/20/2008   right sideburn(MOHS), sup-Left upper back (CX35FU), sup-mid back (CX35FU), nod-Right lower back )CX35FU), nod-right upperarm (CX35FU)   Basal  cell carcinoma 06/08/2016   sup-Left upper back (CX35FU), mid back (CX35FU), right lower back (CX35FU), nod-Right upperarm (CX35FU)   Cancer (HCC)    skin - basil cell   Depression    Diabetes mellitus without complication (HCC)    Dysrhythmia    a-fib   GERD (gastroesophageal reflux disease)    Hyperlipidemia    Hypertension    Neuropathy    Obesity    Paroxysmal atrial fibrillation (HCC)    Peripheral vascular disease ([place])    diabetic neuropathy in both feet   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     in situ left forearm posterior tx after biopsy    Sleep apnea    uses C-pap machine   Squamous cell carcinoma of skin 02/11/2013   in situ-Right temple (CX35FU)   Squamous cell carcinoma of skin 08/20/2008   in situ- front scalp (CX35FU)   Squamous cell carcinoma of skin 06/08/2016   in situ-front scalp (CX35FU)   Squamous cell carcinoma of skin 02/05/2019   in situ-right sideburn-sup (CX35FU), in situ-right sideburn,inf (CX35FU)   Stroke ([place])    08/09/2017    Past Surgical History:  Procedure Laterality Date   APPENDECTOMY  1962   BACK SURGERY  00-02-12   x3   BASAL CELL CARCINOMA EXCISION  93/06/10   COLONOSCOPY     KNEE ARTHROSCOPY  005/01/02   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   Procedure: TOTAL LEFT KNEE ARTHROPLASTY;  Surgeon: [name], MD;  Location: [facility];  Service: Orthopedics;  Laterality: Left;   TOTAL KNEE ARTHROPLASTY Right 10/08/2018   Procedure: RIGHT TOTAL KNEE ARTHROPLASTY;  Surgeon: [name], MD;  Location: [facility];  Service: Orthopedics;  Laterality: Right;  42min     Current Outpatient Medications  Medication Sig Dispense Refill   ALPRAZolam (XANAX) 0.5 MG tablet Take 0.5 mg by mouth 2 (two) times daily.      atorvastatin (LIPITOR) 80 MG tablet Take 1 tablet (80 mg total) by mouth daily at 6 PM. 30 tablet 0   benazepril (LOTENSIN) 40 MG tablet Take 1 tablet (40 mg total) by mouth daily.     ELIQUIS 5 MG TABS tablet Take 1 tablet by mouth twice daily 60 tablet 5   ergocalciferol (VITAMIN D2) 50000 units capsule Take 50,000 Units by mouth once a week.      gabapentin (NEURONTIN) 300 MG capsule Take 2 capsules (600 mg total) by mouth at bedtime. 180 capsule 3   glucose blood (ONETOUCH ULTRA) test strip USE 1 STRIP TO CHECK GLUCOSE 4 TIMES DAILY     hydroxyurea (HYDREA) 500 MG capsule TAKE 2 CAPSULES BY MOUTH IN THE MORNING AND 1 IN THE EVENING 270 capsule 4   insulin NPH-regular Human (NOVOLIN 70/30) (70-30) 100 UNIT/ML  injection Inject 44 Units into the skin 2 (two) times daily with a meal.     levothyroxine (SYNTHROID) 75 MCG tablet TAKE 1 TABLET BY MOUTH IN THE MORNING ON AN EMPTY STOMACH 30 MINUTES BEFORE MEAL(S) AND OTHER MEDS     metFORMIN (GLUCOPHAGE) 1000 MG tablet Take 500 mg by mouth 2 (two) times daily.     metoprolol tartrate (LOPRESSOR) 50 MG tablet Take 50 mg by mouth 2 (two) times daily.     Multiple Vitamin (MULTIVITAMIN WITH MINERALS) TABS tablet Take 1 tablet by mouth daily.     omeprazole (PRILOSEC) 20 MG capsule Take 20 mg by mouth daily.     ONETOUCH VERIO test strip SMARTSIG:Via Meter  torsemide (DEMADEX) 20 MG tablet Take 3 tablets (60 mg total) by mouth daily. 270 tablet 3   venlafaxine XR (EFFEXOR-XR) 75 MG 24 hr capsule Take 75 mg by mouth daily.     No current facility-administered medications for this visit.    Allergies:   Patient has no known allergies.    Social History:  The patient  reports that he quit smoking about 28 years ago. His smoking use included cigars. He has a 30.00 pack-year smoking history. He has never used smokeless tobacco. He reports current alcohol use. He reports that he does not use drugs.   Family History:  The patient's family history includes CVA in his father; Cancer in his mother; Diabetes Mellitus II in his mother; Heart failure in his father; Hypertension in his mother and sister.    ROS:  Please see the history of present illness.   Otherwise, review of systems are positive for none. All other systems are reviewed and negative.    PHYSICAL EXAM: VS:  BP 110/60    Pulse 64    Ht 5' 11.5" (1.816 m)    Wt 291 lb (132 kg)    SpO2 93%    BMI 40.02 kg/m  , BMI Body mass index is 40.02 kg/m.   General: Overweight,  NAD Neck: Negative for carotid bruits. No JVD Lungs:Clear to ausculation bilaterally. No wheezes, rales, or rhonchi. Breathing is unlabored. Cardiovascular: Regularly irregular. No murmurs Extremities: 1-2+ BLE edema.  Radial pulses 2+ bilaterally Neuro: Alert and oriented. No focal deficits. No facial asymmetry. MAE spontaneously. Psych: Responds to questions appropriately with normal affect.     EKG:  EKG is ordered today. The ekg ordered today demonstrates AF with HR 61bpm, no acute changes    Recent Labs: 06/25/2020: ALT 24; Hemoglobin 11.8; Platelets 348 07/28/2020: BUN 17; Creatinine, Ser 1.10; Potassium 5.5; Sodium 137    Lipid Panel No results found for: CHOL, TRIG, HDL, CHOLHDL, VLDL, LDLCALC, LDLDIRECT    Wt Readings from Last 3 Encounters:  08/12/20 291 lb (132 kg)  07/28/20 289 lb 3.2 oz (131.2 kg)  06/25/20 291 lb 1.6 oz (132 kg)     Other studies Reviewed: Additional studies/ records that were reviewed today include:  Review of the above records demonstrates:   Long-term monitor October 2020: Study Highlights   Basic rhythm is NSR  PAF and Paroxysmal atrial flutter noted.  AF/AFl burden 34%. Longest episode 11 hours. Fastest rate 140 bpm  Post conversion pauses up to 3.4 seconds with persistebt bradycardia < 35 bpm  Rare PVC's    ASSESSMENT AND PLAN:  1.  Chronic diastolic CHF: -More recently having issues with LE edema despite following a low-sodium diet>>previously been maintained on torsemide 40 mg daily>>plan was to increase to 60mg  daily however he reports not starting this out of concern for frequent urination  -Repeat EKG today shows rate controlled atrial fibrillation/flutter>>likely contributing to more recent edema -See plan below  2.  Persistent atrial fibrillation/flutter: -ZIO monitor from 06/06/2019 with NSR, PAF and paroxysmal atrial flutter with a AF burden of 34%.  Also had postconversion pauses up to 3.4 seconds with persistent bradycardia and rare PVCs.   -Repeat EKG today with rate controlled atrial fibrillation/flutter  -Reports compliance with Eliquis 5 mg twice daily and metoprolol 50 mg twice daily  -Previously saw Dr. Rayann Heman at which  time he was not felt to be a candidate for AF ablation. Appears Tikosyn was discussed however not pursued. Last echocardiogram from 06/03/2019  with mildly dilated LA and moderately dilated RA  -Plan previously discussed with Dr. Job Founds have patient see Dr. Rayann Heman once again for potential options to re-establish NSR. Pt remains completely asymptomatic>>unclear if he wishes to pursue conversion options.   3.  Tachybradycardia syndrome: -Not felt to be a PPM candidate per EP  4.  OSA: -Intolerant to CPAP  5.  HTN: -Stable, 110/60 -Continue current regimen -No complaints of dizziness or presyncopal symptoms  Current medicines are reviewed at length with the patient today.  The patient does not have concerns regarding medicines.  The following changes have been made:  no change  Labs/ tests ordered today include: None   Orders Placed This Encounter  Procedures   EKG 12-Lead   Disposition:   FU with Dr. Rayann Heman in 2 weeks  Signed, Kathyrn Drown, NP  08/12/2020 4:52 PM    [address], [place], [place]  [id number] Phone: [phone]; Fax: [phone]

## 2020-08-12 ENCOUNTER — Encounter: Payer: Self-pay | Admitting: Cardiology

## 2020-08-12 ENCOUNTER — Ambulatory Visit: Payer: Medicare HMO | Admitting: Cardiology

## 2020-08-12 ENCOUNTER — Other Ambulatory Visit: Payer: Self-pay

## 2020-08-12 ENCOUNTER — Other Ambulatory Visit: Payer: Medicare HMO

## 2020-08-12 VITALS — BP 110/60 | HR 64 | Ht 71.5 in | Wt 291.0 lb

## 2020-08-12 DIAGNOSIS — Z7901 Long term (current) use of anticoagulants: Secondary | ICD-10-CM

## 2020-08-12 DIAGNOSIS — I10 Essential (primary) hypertension: Secondary | ICD-10-CM | POA: Diagnosis not present

## 2020-08-12 DIAGNOSIS — I480 Paroxysmal atrial fibrillation: Secondary | ICD-10-CM | POA: Diagnosis not present

## 2020-08-12 DIAGNOSIS — I4819 Other persistent atrial fibrillation: Secondary | ICD-10-CM

## 2020-08-12 DIAGNOSIS — I495 Sick sinus syndrome: Secondary | ICD-10-CM

## 2020-08-12 DIAGNOSIS — I5032 Chronic diastolic (congestive) heart failure: Secondary | ICD-10-CM | POA: Diagnosis not present

## 2020-08-12 NOTE — Patient Instructions (Addendum)
Medication Instructions:   Your physician recommends that you continue on your current medications as directed. Please refer to the Current Medication list given to you today.   *If you need a refill on your cardiac medications before your next appointment, please call your pharmacy*   Lab Work: BMET TODAY   If you have labs (blood work) drawn today and your tests are completely normal, you will receive your results only by: [name] Kitchen MyChart Message (if you have MyChart) OR . A paper copy in the mail If you have any lab test that is abnormal or we need to change your treatment, we will call you to review the results.   Testing/Procedures: NONE ORDERED  TODAY   Follow-Up: At [facility], you and your health needs are our priority.  As part of our continuing mission to provide you with exceptional heart care, we have created designated Provider Care Teams.  These Care Teams include your primary Cardiologist (physician) and Advanced Practice Providers (APPs -  Physician Assistants and Nurse Practitioners) who all work together to provide you with the care you need, when you need it.  We recommend signing up for the patient portal called "MyChart".  Sign up information is provided on this After Visit Summary.  MyChart is used to connect with patients for Virtual Visits (Telemedicine).  Patients are able to view lab/test results, encounter notes, upcoming appointments, etc.  Non-urgent messages can be sent to your provider as well.   To learn more about what you can do with MyChart, go to [email or web address].    Your next appointment:  AS SCHEDULED WITH DR. ALLRED    Your physician wants you to follow-up in:  [place] will receive a reminder letter in the mail two months in advance. If you don't receive a letter, please call our office to schedule the follow-up appointment.     Other Instructions:

## 2020-08-13 LAB — BASIC METABOLIC PANEL
BUN/Creatinine Ratio: 22 (ref 10–24)
BUN: 21 mg/dL (ref 8–27)
CO2: 25 mmol/L (ref 20–29)
Calcium: 8.8 mg/dL (ref 8.6–10.2)
Chloride: 98 mmol/L (ref 96–106)
Creatinine, Ser: 0.95 mg/dL (ref 0.76–1.27)
GFR calc Af Amer: 89 mL/min/{1.73_m2} (ref >59)
GFR calc non Af Amer: 77 mL/min/{1.73_m2} (ref >59)
Glucose: 287 mg/dL — ABNORMAL HIGH (ref 65–99)
Potassium: 5 mmol/L (ref 3.5–5.2)
Sodium: 136 mmol/L (ref 134–144)

## 2020-08-21 ENCOUNTER — Other Ambulatory Visit: Payer: Medicare HMO

## 2020-08-21 ENCOUNTER — Ambulatory Visit: Payer: Medicare HMO | Admitting: Internal Medicine

## 2020-08-21 ENCOUNTER — Encounter: Payer: Self-pay | Admitting: Internal Medicine

## 2020-08-21 ENCOUNTER — Other Ambulatory Visit: Payer: Self-pay

## 2020-08-21 ENCOUNTER — Ambulatory Visit: Payer: Medicare HMO | Admitting: Cardiology

## 2020-08-21 VITALS — BP 100/62 | HR 85 | Ht 71.5 in | Wt 296.2 lb

## 2020-08-21 DIAGNOSIS — I480 Paroxysmal atrial fibrillation: Secondary | ICD-10-CM

## 2020-08-21 DIAGNOSIS — G4733 Obstructive sleep apnea (adult) (pediatric): Secondary | ICD-10-CM

## 2020-08-21 DIAGNOSIS — I10 Essential (primary) hypertension: Secondary | ICD-10-CM | POA: Diagnosis not present

## 2020-08-21 DIAGNOSIS — I5032 Chronic diastolic (congestive) heart failure: Secondary | ICD-10-CM | POA: Diagnosis not present

## 2020-08-21 NOTE — Progress Notes (Signed)
PCP: [name], MD Primary Cardiologist: Dr Tamala Julian Primary EP: Dr Rosealee Albee [name] is a 79 y.o. male who presents today for routine electrophysiology followup.  Since last being seen in our clinic, the patient reports doing reasonably well.  He is markedly overweight and has a very poor diet.  He is not active and does not use his CPAP for his OSA.  He is mostly unaware of afib.  He has edema.  Today, he denies symptoms of palpitations, chest pain, shortness of breath,  dizziness, presyncope, or syncope.  The patient is otherwise without complaint today.   Past Medical History:  Diagnosis Date  . Anxiety   . Arthritis   . Basal cell carcinoma 07/18/1991   Left nasal brdige (MOHS)  . Basal cell carcinoma 01/23/1992   lower right back-(CX35FU)  . Basal cell carcinoma 05/20/2003   sup-left back (CX35FU)  . Basal cell carcinoma 07/28/2011   post lower neck  . Basal cell carcinoma 08/20/2008   right sideburn(MOHS), sup-Left upper back (CX35FU), sup-mid back (CX35FU), nod-Right lower back )CX35FU), nod-right upperarm (CX35FU)  . Basal cell carcinoma 06/08/2016   sup-Left upper back (CX35FU), mid back (CX35FU), right lower back (CX35FU), nod-Right upperarm (CX35FU)  . Cancer (HCC)    skin - basil cell  . Depression   . Diabetes mellitus without complication ([place])   . Dysrhythmia    a-fib  . GERD (gastroesophageal reflux disease)   . Hyperlipidemia   . Hypertension   . Neuropathy   . Obesity   . Paroxysmal atrial fibrillation (HCC)   . Peripheral vascular disease ([place])    diabetic neuropathy in both feet  . 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   in situ left forearm posterior tx after biopsy   . Sleep apnea    uses C-pap machine  . Squamous cell carcinoma of skin 02/11/2013   in situ-Right temple (CX35FU)  . Squamous cell carcinoma of skin 08/20/2008   in situ- front scalp (CX35FU)  . Squamous cell carcinoma of skin 06/08/2016   in situ-front scalp  (CX35FU)  . Squamous cell carcinoma of skin 02/05/2019   in situ-right sideburn-sup (CX35FU), in situ-right sideburn,inf (CX35FU)  . Stroke [facility])    08/09/2017   Past Surgical History:  Procedure Laterality Date  . APPENDECTOMY  1962  . BACK SURGERY  00-02-12   x3  . BASAL CELL CARCINOMA EXCISION  93/06/10  . COLONOSCOPY    . KNEE ARTHROSCOPY  005/01/02  . 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 TOTAL KNEE ARTHROPLASTY Right 10/08/2018   Procedure: RIGHT TOTAL KNEE ARTHROPLASTY;  Surgeon: [name], MD;  Location: [facility];  Service: Orthopedics;  Laterality: Right;  70min    ROS- all systems are reviewed and negatives except as per HPI above  Current Outpatient Medications  Medication Sig Dispense Refill  . ALPRAZolam (XANAX) 0.5 MG tablet Take 0.5 mg by mouth 2 (two) times daily.    [name] Kitchen atorvastatin (LIPITOR) 80 MG tablet Take 1 tablet (80 mg total) by mouth daily at 6 PM. 30 tablet 0  . benazepril (LOTENSIN) 40 MG tablet Take 1 tablet (40 mg total) by mouth daily.    [name] Kitchen ELIQUIS 5 MG TABS tablet Take 1 tablet by mouth twice daily 60 tablet 5  . ergocalciferol (VITAMIN D2) 50000 units capsule Take 50,000 Units by mouth once  a week.     . gabapentin (NEURONTIN) 300 MG capsule Take 2 capsules (600 mg total) by mouth at bedtime. 180 capsule 3  . glucose blood (ONETOUCH ULTRA) test strip USE 1 STRIP TO CHECK GLUCOSE 4 TIMES DAILY    . hydroxyurea (HYDREA) 500 MG capsule TAKE 2 CAPSULES BY MOUTH IN THE MORNING AND 1 IN THE EVENING 270 capsule 4  . insulin NPH-regular Human (NOVOLIN 70/30) (70-30) 100 UNIT/ML injection Inject 44 Units into the skin 2 (two) times daily with a meal.    . levothyroxine (SYNTHROID) 75 MCG tablet TAKE 1 TABLET BY MOUTH IN THE MORNING ON AN EMPTY STOMACH 30 MINUTES BEFORE MEAL(S) AND OTHER MEDS    . metFORMIN (GLUCOPHAGE) 1000  MG tablet Take 500 mg by mouth 2 (two) times daily.    . metoprolol tartrate (LOPRESSOR) 50 MG tablet Take 50 mg by mouth 2 (two) times daily.    . Multiple Vitamin (MULTIVITAMIN WITH MINERALS) TABS tablet Take 1 tablet by mouth daily.    [name] Kitchen omeprazole (PRILOSEC) 20 MG capsule Take 20 mg by mouth daily.    Glory Rosebush VERIO test strip SMARTSIG:Via Meter    . torsemide (DEMADEX) 20 MG tablet Take 3 tablets (60 mg total) by mouth daily. 270 tablet 3  . venlafaxine XR (EFFEXOR-XR) 75 MG 24 hr capsule Take 75 mg by mouth daily.     No current facility-administered medications for this visit.    Physical Exam: Vitals:   08/21/20 1431  BP: 100/62  Pulse: 85  SpO2: 94%  Weight: 296 lb 3.2 oz (134.4 kg)  Height: 5' 11.5" (1.816 m)    GEN- The patient is obese appearing, alert and oriented x 3 today.   Head- normocephalic, atraumatic Eyes-  Sclera clear, conjunctiva pink Ears- hearing intact Oropharynx- clear Lungs- Clear to ausculation bilaterally, normal work of breathing Heart- IRRR GI- soft, NT, ND, + BS Extremities- + edema  Wt Readings from Last 3 Encounters:  08/21/20 296 lb 3.2 oz (134.4 kg)  08/12/20 291 lb (132 kg)  07/28/20 289 lb 3.2 oz (131.2 kg)   Echo 06/03/19- reviewed  EKG tracing ordered today is personally reviewed and shows afib  Assessment and Plan:  1. Paroxysmal atrial fibrillation/ atrial flutter The patient has symptomatic, recurrent paroxysmal atrial fibrillation. he has not tried AAD Chads2vasc score is 8.  he is anticoagulated with eliquis . We discussed options at length today.  Unfortunately, he has multiple AF risk factors that have not been modified.  He has a very low anticipated success with ablation without substantial lifestyle change. We could consider AADs, though I am not convinced that without lifestyle/ dietary changes his edema would improve.  He is otherwise asymptomatic. We could consider tikosyn or amiodarone if he does not have  improvement in his diastolic CHF with lifestyle/ dietary change. Repeat echo  2. Morbid obesity I have reviewed the patients BMI and decreased success rates with ablation at length today.  Weight loss is strongly advised.  Per Guijian et al (PACE 2013; 36: 387-564), patients with BMI 25-29.9 (obese) have a 27% increase in AF recurrence post ablation.  Patients with BMI >30 have a 31% increase in AF recurrence post ablation when compared to those with BMI <25. Lifestyle modification is advised  3. OSA Not tolerant of cpap I have advised that he follow-up with his treating physician to revisit  4. Chronic diastolic dysfunction He has a very high sodium diet, does not use CPAP, and is  markedly obese.  I am not convinced that afib is the driver for his diastolic dysfunction. Lifestyle modification is advised Echo is ordered 2 gram sodium diet  5. HTN Stable No change required today   Risks, benefits and potential toxicities for medications prescribed and/or refilled reviewed with patient today.   Follow-up in AF clinic frequently for risk factor modification.  He may benefit from nutrition classes and the AF exercise [employer] program through [facility]. I will see as needed  Thompson Grayer MD, [facility] 08/21/2020 3:02 PM

## 2020-08-21 NOTE — Patient Instructions (Addendum)
Medication Instructions:  Your physician recommends that you continue on your current medications as directed. Please refer to the Current Medication list given to you today.  *If you need a refill on your cardiac medications before your next appointment, please call your pharmacy*  Lab Work: None ordered.  If you have labs (blood work) drawn today and your tests are completely normal, you will receive your results only by: [name] Kitchen MyChart Message (if you have MyChart) OR . A paper copy in the mail If you have any lab test that is abnormal or we need to change your treatment, we will call you to review the results.  Testing/Procedures: please schedule echo Your physician has requested that you have an echocardiogram. Echocardiography is a painless test that uses sound waves to create images of your heart. It provides your doctor with information about the size and shape of your heart and how well your heart's chambers and valves are working. This procedure takes approximately one hour. There are no restrictions for this procedure.   Follow-Up: At [facility], you and your health needs are our priority.  As part of our continuing mission to provide you with exceptional heart care, we have created designated Provider Care Teams.  These Care Teams include your primary Cardiologist (physician) and Advanced Practice Providers (APPs -  Physician Assistants and Nurse Practitioners) who all work together to provide you with the care you need, when you need it.  We recommend signing up for the patient portal called "MyChart".  Sign up information is provided on this After Visit Summary.  MyChart is used to connect with patients for Virtual Visits (Telemedicine).  Patients are able to view lab/test results, encounter notes, upcoming appointments, etc.  Non-urgent messages can be sent to your provider as well.   To learn more about what you can do with MyChart, go to [email or web address].    Your next  appointment:   Your physician wants you to follow-up in: 3 months with the afib clinic. They will contact you to schedule.     Other Instructions:  DASH Eating Plan DASH stands for "Dietary Approaches to Stop Hypertension." The DASH eating plan is a healthy eating plan that has been shown to reduce high blood pressure (hypertension). It may also reduce your risk for type 2 diabetes, heart disease, and stroke. The DASH eating plan may also help with weight loss. What are tips for following this plan?  General guidelines  Avoid eating more than 2,000 mg (milligrams) of salt (sodium) a day. If you have hypertension, you may need to reduce your sodium intake to 1,500 mg a day.  Limit alcohol intake to no more than 1 drink a day for nonpregnant women and 2 drinks a day for men. One drink equals 12 oz of beer, 5 oz of wine, or 1 oz of hard liquor.  Work with your health care provider to maintain a healthy body weight or to lose weight. Ask what an ideal weight is for you.  Get at least 30 minutes of exercise that causes your heart to beat faster (aerobic exercise) most days of the week. Activities may include walking, swimming, or biking.  Work with your health care provider or diet and nutrition specialist (dietitian) to adjust your eating plan to your individual calorie needs. Reading food labels   Check food labels for the amount of sodium per serving. Choose foods with less than 5 percent of the Daily Value of sodium. Generally, foods with less than 300  mg of sodium per serving fit into this eating plan.  To find whole grains, look for the word "whole" as the first word in the ingredient list. Shopping  Buy products labeled as "low-sodium" or "no salt added."  Buy fresh foods. Avoid canned foods and premade or frozen meals. Cooking  Avoid adding salt when cooking. Use salt-free seasonings or herbs instead of table salt or sea salt. Check with your health care provider or pharmacist  before using salt substitutes.  Do not fry foods. Cook foods using healthy methods such as baking, boiling, grilling, and broiling instead.  Cook with heart-healthy oils, such as olive, canola, soybean, or sunflower oil. Meal planning  Eat a balanced diet that includes: ? 5 or more servings of fruits and vegetables each day. At each meal, try to fill half of your plate with fruits and vegetables. ? Up to 6-8 servings of whole grains each day. ? Less than 6 oz of lean meat, poultry, or fish each day. A 3-oz serving of meat is about the same size as a deck of cards. One egg equals 1 oz. ? 2 servings of low-fat dairy each day. ? A serving of nuts, seeds, or beans 5 times each week. ? Heart-healthy fats. Healthy fats called Omega-3 fatty acids are found in foods such as flaxseeds and coldwater fish, like sardines, salmon, and mackerel.  Limit how much you eat of the following: ? Canned or prepackaged foods. ? Food that is high in trans fat, such as fried foods. ? Food that is high in saturated fat, such as fatty meat. ? Sweets, desserts, sugary drinks, and other foods with added sugar. ? Full-fat dairy products.  Do not salt foods before eating.  Try to eat at least 2 vegetarian meals each week.  Eat more home-cooked food and less restaurant, buffet, and fast food.  When eating at a restaurant, ask that your food be prepared with less salt or no salt, if possible. What foods are recommended? The items listed may not be a complete list. Talk with your dietitian about what dietary choices are best for you. Grains Whole-grain or whole-wheat bread. Whole-grain or whole-wheat pasta. Brown rice. [place]. Bulgur. Whole-grain and low-sodium cereals. Pita bread. Low-fat, low-sodium crackers. Whole-wheat flour tortillas. Vegetables Fresh or frozen vegetables (raw, steamed, roasted, or grilled). Low-sodium or reduced-sodium tomato and vegetable juice. Low-sodium or reduced-sodium tomato  sauce and tomato paste. Low-sodium or reduced-sodium canned vegetables. Fruits All fresh, dried, or frozen fruit. Canned fruit in natural juice (without added sugar). Meat and other protein foods Skinless chicken or [place]. Ground chicken or [place]. Pork with fat trimmed off. Fish and seafood. Egg whites. Dried beans, peas, or lentils. Unsalted nuts, nut butters, and seeds. Unsalted canned beans. Lean cuts of beef with fat trimmed off. Low-sodium, lean deli meat. Dairy Low-fat (1%) or fat-free (skim) milk. Fat-free, low-fat, or reduced-fat cheeses. Nonfat, low-sodium ricotta or cottage cheese. Low-fat or nonfat yogurt. Low-fat, low-sodium cheese. Fats and oils Soft margarine without trans fats. Vegetable oil. Low-fat, reduced-fat, or light mayonnaise and salad dressings (reduced-sodium). Canola, safflower, olive, soybean, and sunflower oils. Avocado. Seasoning and other foods Herbs. Spices. Seasoning mixes without salt. Unsalted popcorn and pretzels. Fat-free sweets. What foods are not recommended? The items listed may not be a complete list. Talk with your dietitian about what dietary choices are best for you. Grains Baked goods made with fat, such as croissants, muffins, or some breads. Dry pasta or rice meal packs. Vegetables Creamed or  fried vegetables. Vegetables in a cheese sauce. Regular canned vegetables (not low-sodium or reduced-sodium). Regular canned tomato sauce and paste (not low-sodium or reduced-sodium). Regular tomato and vegetable juice (not low-sodium or reduced-sodium). Angie Fava. Olives. Fruits Canned fruit in a light or heavy syrup. Fried fruit. Fruit in cream or butter sauce. Meat and other protein foods Fatty cuts of meat. Ribs. Fried meat. Berniece Salines. Sausage. Bologna and other processed lunch meats. Salami. Fatback. Hotdogs. Bratwurst. Salted nuts and seeds. Canned beans with added salt. Canned or smoked fish. Whole eggs or egg yolks. Chicken or [place] with skin. Dairy Whole  or 2% milk, cream, and half-and-half. Whole or full-fat cream cheese. Whole-fat or sweetened yogurt. Full-fat cheese. Nondairy creamers. Whipped toppings. Processed cheese and cheese spreads. Fats and oils Butter. Stick margarine. Lard. Shortening. Ghee. Bacon fat. Tropical oils, such as coconut, palm kernel, or palm oil. Seasoning and other foods Salted popcorn and pretzels. Onion salt, garlic salt, seasoned salt, table salt, and sea salt. Worcestershire sauce. Tartar sauce. Barbecue sauce. Teriyaki sauce. Soy sauce, including reduced-sodium. Steak sauce. Canned and packaged gravies. Fish sauce. Oyster sauce. Cocktail sauce. Horseradish that you find on the shelf. Ketchup. Mustard. Meat flavorings and tenderizers. Bouillon cubes. Hot sauce and Tabasco sauce. Premade or packaged marinades. Premade or packaged taco seasonings. Relishes. Regular salad dressings. Where to find more information:  National Heart, Lung, and [place]: https://wilson-Sayas.com/  American Heart Association: www.heart.org Summary  The DASH eating plan is a healthy eating plan that has been shown to reduce high blood pressure (hypertension). It may also reduce your risk for type 2 diabetes, heart disease, and stroke.  With the DASH eating plan, you should limit salt (sodium) intake to 2,300 mg a day. If you have hypertension, you may need to reduce your sodium intake to 1,500 mg a day.  When on the DASH eating plan, aim to eat more fresh fruits and vegetables, whole grains, lean proteins, low-fat dairy, and heart-healthy fats.  Work with your health care provider or diet and nutrition specialist (dietitian) to adjust your eating plan to your individual calorie needs. This information is not intended to replace advice given to you by your health care provider. Make sure you discuss any questions you have with your health care provider. Document Revised: 08/11/2017 Document Reviewed: 08/22/2016 Elsevier Patient Education   2020 [employer].

## 2020-08-24 ENCOUNTER — Telehealth: Payer: Medicare HMO | Admitting: Internal Medicine

## 2020-08-25 ENCOUNTER — Other Ambulatory Visit: Payer: Self-pay | Admitting: Interventional Cardiology

## 2020-08-26 NOTE — Telephone Encounter (Signed)
Eliquis 5mg  refill request received. Patient is 79 years old, weight-134.4kg, Crea-0.95 on 08/12/2020, Diagnosis-Afib, and last seen by Dr. Rayann Heman on 08/21/20. Dose is appropriate based on dosing criteria. Will send in refill to requested pharmacy.

## 2020-09-24 ENCOUNTER — Encounter (HOSPITAL_COMMUNITY): Payer: Self-pay

## 2020-09-24 ENCOUNTER — Ambulatory Visit (HOSPITAL_COMMUNITY): Payer: Medicare HMO | Attending: Cardiology

## 2020-09-24 NOTE — Progress Notes (Signed)
Verified appointment "no show" status with AEdsel Petrin at 13:58.

## 2020-09-29 ENCOUNTER — Other Ambulatory Visit: Payer: Self-pay | Admitting: Interventional Cardiology

## 2020-09-30 ENCOUNTER — Other Ambulatory Visit: Payer: Self-pay | Admitting: Oncology

## 2020-09-30 DIAGNOSIS — D473 Essential (hemorrhagic) thrombocythemia: Secondary | ICD-10-CM

## 2020-10-01 IMAGING — MR MR HEAD W/O CM
10 of 11 series · 43 of 48 positions shown · non-contrast
Comparison: MRI head August 10, 2017

CLINICAL DATA: Confusion, slurred speech after knee placement
October 08, 2018. history of hypertension, hyperlipidemia, diabetes
and stroke.

EXAM:
MRI HEAD WITHOUT CONTRAST
TECHNIQUE: Multiplanar, multiecho pulse sequences of the brain and surrounding
structures were obtained without intravenous contrast.

[Series 9: DWI · axial · 3.0mm · 0.88mm/px · z∈[-13,+129]mm · 10 of 100 slices shown (1 of 4)]
[im 1/100]
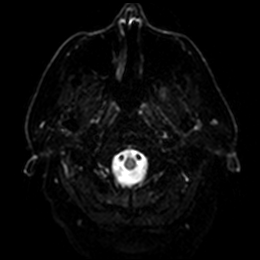
[im 12/100]
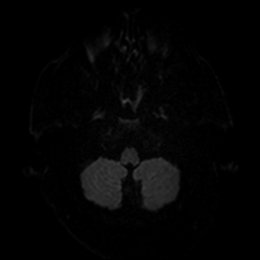
[im 23/100]
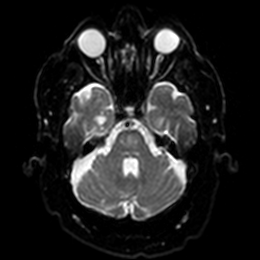
[im 34/100]
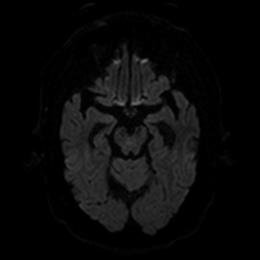
[im 45/100]
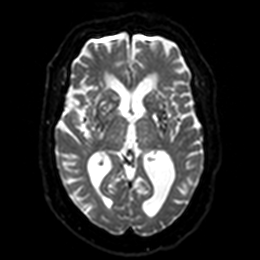
[im 56/100]
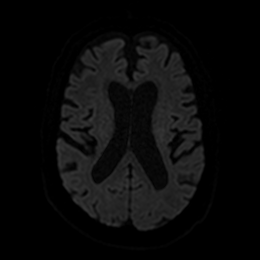
[im 67/100]
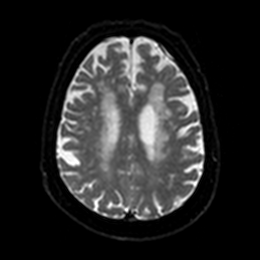
[im 78/100]
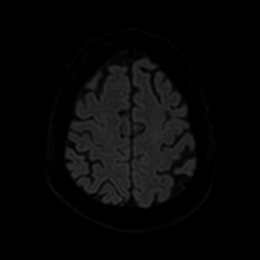
[im 89/100]
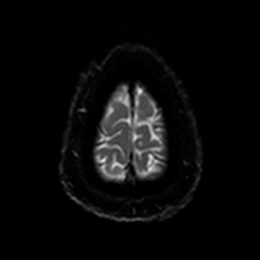
[im 100/100]
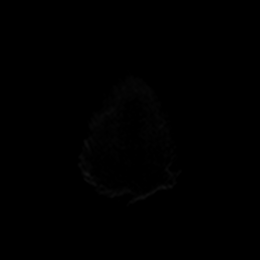

[Series 10: DWI · axial · 3.0mm · 0.88mm/px · z∈[-13,+129]mm · 4 of 50 slices shown (2 of 4)]
[im 1/50]
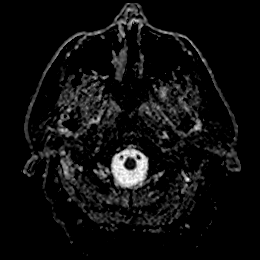
[im 17/50]
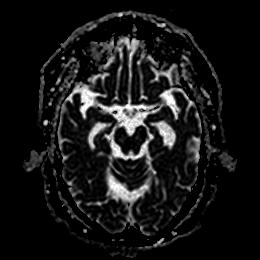
[im 33/50]
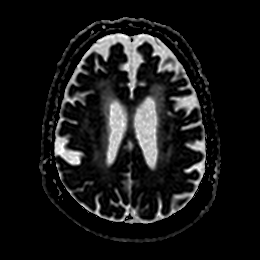
[im 50/50]
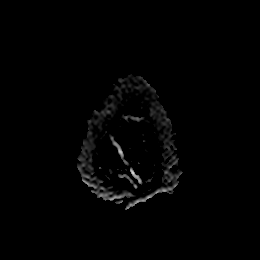

[Series 11: DWI · coronal · 4.0mm · 0.88mm/px · 7 of 76 slices shown (3 of 4)]
[im 1/76]
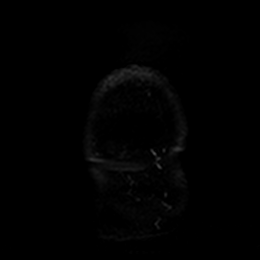
[im 13/76]
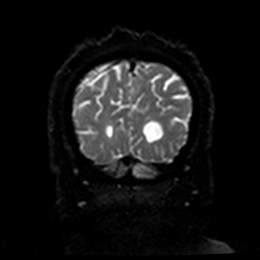
[im 26/76]
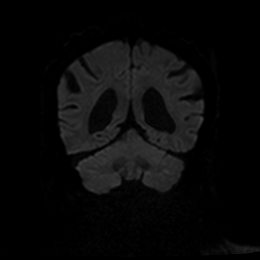
[im 38/76]
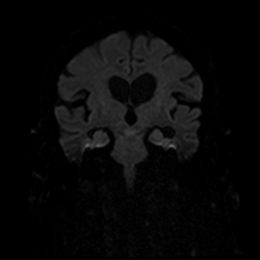
[im 51/76]
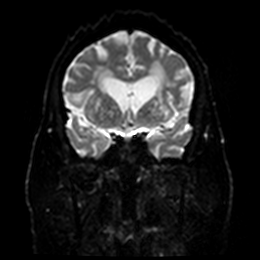
[im 63/76]
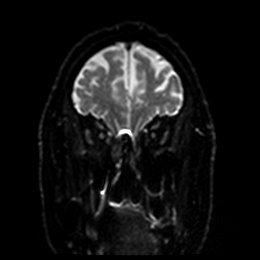
[im 76/76]
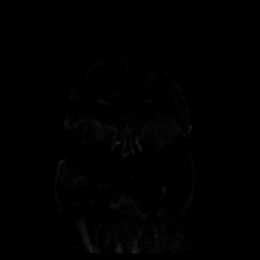

[Series 12: DWI · coronal · 4.0mm · 0.88mm/px · 3 of 38 slices shown (4 of 4)]
[im 1/38]
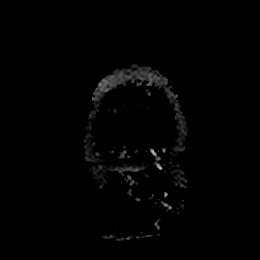
[im 19/38]
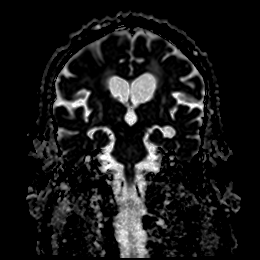
[im 38/38]
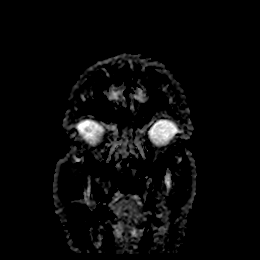

[Series 13: T1 · sagittal · 5.0mm · 0.75mm/px · 2 of 23 slices shown]
[im 1/23]
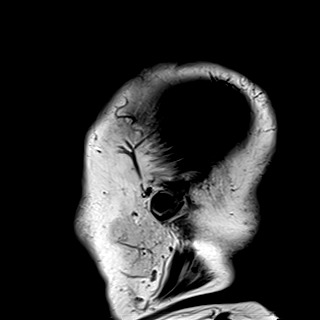
[im 23/23]
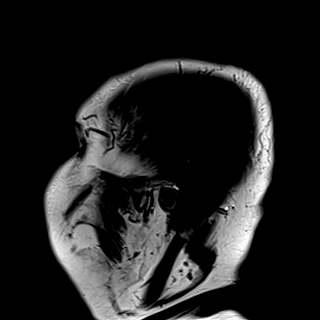

[Series 14: T2 · axial · 5.0mm · 0.72mm/px · z∈[-18,+127]mm · 2 of 26 slices shown (1 of 2)]
[im 1/26]
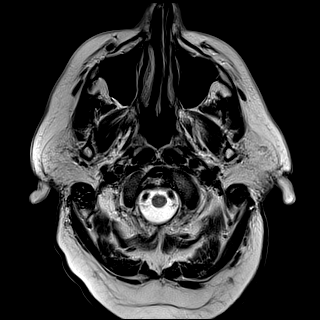
[im 26/26]
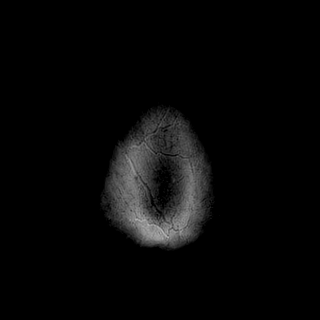

[Series 15: FLAIR · axial · 5.0mm · 0.45mm/px · z∈[-20,+126]mm · 2 of 26 slices shown]
[im 1/26]
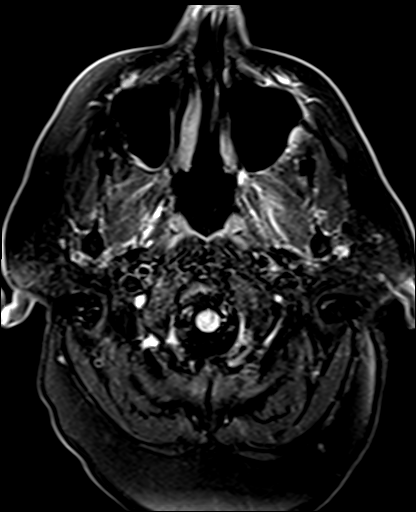
[im 26/26]
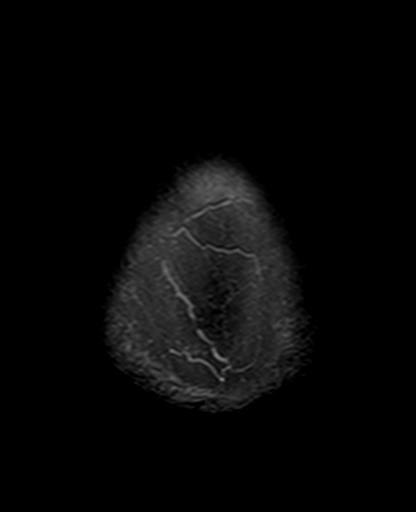

[Series 17: pha_images · axial · 3.0mm · 0.90mm/px · z∈[-32,+140]mm · 5 of 60 slices shown]
[im 1/60]
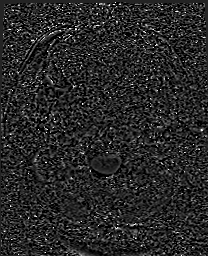
[im 15/60]
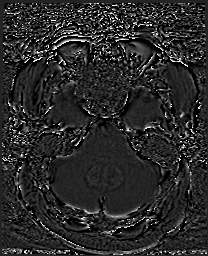
[im 30/60]
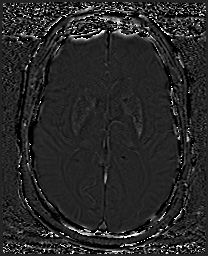
[im 45/60]
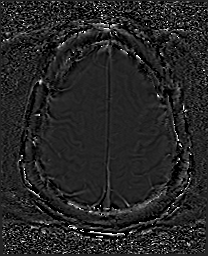
[im 60/60]
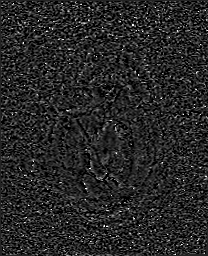

[Series 18: swi_images · axial · 3.0mm · 0.90mm/px · z∈[-32,+140]mm · 5 of 60 slices shown]
[im 1/60]
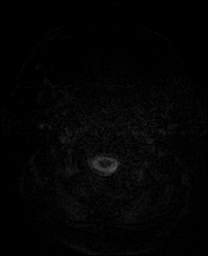
[im 15/60]
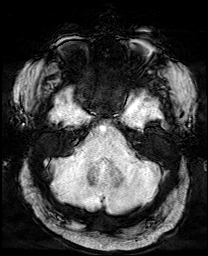
[im 30/60]
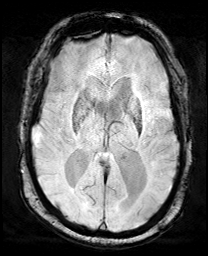
[im 45/60]
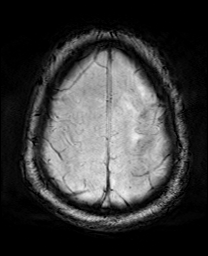
[im 60/60]
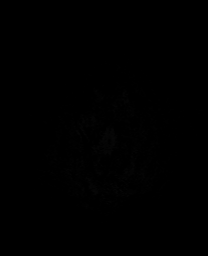

[Series 21: T2 · coronal · 5.0mm · 0.72mm/px · 3 of 31 slices shown (2 of 2)]
[im 1/31]
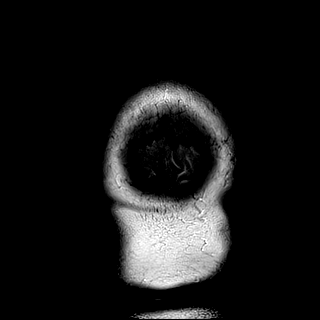
[im 16/31]
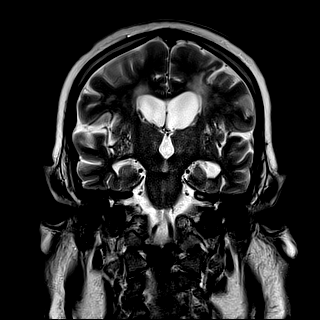
[im 31/31]
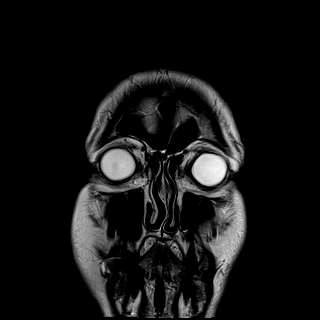

[43 of 48 positions shown; findings below may reference images not displayed]

FINDINGS: INTRACRANIAL CONTENTS: No reduced diffusion to suggest acute
ischemia. Moderate to severe parenchymal brain volume loss. Old LEFT
basal ganglia small infarcts. No hydrocephalus. Faint susceptibility
artifact associated with LEFT frontal encephalomalacia. Patchy
pontine and confluent supratentorial white matter FLAIR T2
hyperintensities. Old small RIGHT cerebellar infarct. Old small
RIGHT thalamus lacunar infarct. Prominent basal ganglia and thalami
perivascular spaces associated with chronic small vessel ischemic
changes. No suspicious parenchymal signal, masses, mass effect. No
abnormal extra-axial fluid collections. No extra-axial masses.

VASCULAR: Normal major intracranial vascular flow voids present at
skull base.

SKULL AND UPPER CERVICAL SPINE: No abnormal sellar expansion. No
suspicious calvarial bone marrow signal. Craniocervical junction
maintained.

SINUSES/ORBITS: Trace RIGHT mastoid effusion. Mild maxillary sinus
mucosal thickening without paranasal sinus air-fluid levels.The
included ocular globes and orbital contents are non-suspicious.

OTHER: None.
IMPRESSION: 1. No acute intracranial process.
2. Old LEFT frontal/MCA territory infarct. Old small LEFT basal
ganglia, RIGHT thalamus and RIGHT cerebellar infarcts.
3. Moderate chronic small vessel ischemic changes.
4. Moderate to severe parenchymal brain volume loss.

## 2020-10-07 DIAGNOSIS — H2511 Age-related nuclear cataract, right eye: Secondary | ICD-10-CM | POA: Diagnosis not present

## 2020-10-07 DIAGNOSIS — H25041 Posterior subcapsular polar age-related cataract, right eye: Secondary | ICD-10-CM | POA: Diagnosis not present

## 2020-10-07 DIAGNOSIS — H25011 Cortical age-related cataract, right eye: Secondary | ICD-10-CM | POA: Diagnosis not present

## 2020-10-09 DIAGNOSIS — R3912 Poor urinary stream: Secondary | ICD-10-CM | POA: Diagnosis not present

## 2020-10-09 DIAGNOSIS — R351 Nocturia: Secondary | ICD-10-CM | POA: Diagnosis not present

## 2020-10-09 DIAGNOSIS — R350 Frequency of micturition: Secondary | ICD-10-CM | POA: Diagnosis not present

## 2020-10-09 DIAGNOSIS — N401 Enlarged prostate with lower urinary tract symptoms: Secondary | ICD-10-CM | POA: Diagnosis not present

## 2020-10-09 DIAGNOSIS — R3915 Urgency of urination: Secondary | ICD-10-CM | POA: Diagnosis not present

## 2020-10-16 ENCOUNTER — Other Ambulatory Visit: Payer: Self-pay

## 2020-10-16 ENCOUNTER — Ambulatory Visit (HOSPITAL_COMMUNITY): Payer: Medicare HMO | Attending: Internal Medicine

## 2020-10-16 DIAGNOSIS — I480 Paroxysmal atrial fibrillation: Secondary | ICD-10-CM

## 2020-10-16 LAB — ECHOCARDIOGRAM COMPLETE
Area-P 1/2: 4.73 cm2
S' Lateral: 2.7 cm

## 2020-10-20 DIAGNOSIS — E1136 Type 2 diabetes mellitus with diabetic cataract: Secondary | ICD-10-CM | POA: Diagnosis not present

## 2020-10-20 DIAGNOSIS — H25011 Cortical age-related cataract, right eye: Secondary | ICD-10-CM | POA: Diagnosis not present

## 2020-10-20 DIAGNOSIS — Z794 Long term (current) use of insulin: Secondary | ICD-10-CM | POA: Diagnosis not present

## 2020-10-20 DIAGNOSIS — H25041 Posterior subcapsular polar age-related cataract, right eye: Secondary | ICD-10-CM | POA: Diagnosis not present

## 2020-10-20 DIAGNOSIS — H2511 Age-related nuclear cataract, right eye: Secondary | ICD-10-CM | POA: Diagnosis not present

## 2020-10-26 ENCOUNTER — Inpatient Hospital Stay: Payer: Medicare HMO | Attending: Oncology

## 2020-10-26 ENCOUNTER — Encounter: Payer: Self-pay | Admitting: Oncology

## 2020-10-26 ENCOUNTER — Other Ambulatory Visit: Payer: Self-pay

## 2020-10-26 DIAGNOSIS — D473 Essential (hemorrhagic) thrombocythemia: Secondary | ICD-10-CM | POA: Diagnosis not present

## 2020-10-26 LAB — COMPREHENSIVE METABOLIC PANEL
ALT: 29 U/L (ref 0–44)
AST: 26 U/L (ref 15–41)
Albumin: 3.5 g/dL (ref 3.5–5.0)
Alkaline Phosphatase: 117 U/L (ref 38–126)
Anion gap: 7 (ref 5–15)
BUN: 13 mg/dL (ref 8–23)
CO2: 31 mmol/L (ref 22–32)
Calcium: 9.2 mg/dL (ref 8.9–10.3)
Chloride: 102 mmol/L (ref 98–111)
Creatinine, Ser: 1.11 mg/dL (ref 0.61–1.24)
GFR, Estimated: >60 mL/min (ref >60)
Glucose, Bld: 158 mg/dL — ABNORMAL HIGH (ref 70–99)
Potassium: 4.9 mmol/L (ref 3.5–5.1)
Sodium: 140 mmol/L (ref 135–145)
Total Bilirubin: 0.7 mg/dL (ref 0.3–1.2)
Total Protein: 6.6 g/dL (ref 6.5–8.1)

## 2020-10-26 LAB — CBC WITH DIFFERENTIAL/PLATELET
Abs Immature Granulocytes: 0.02 10*3/uL (ref 0.00–0.07)
Basophils Absolute: 0.1 10*3/uL (ref 0.0–0.1)
Basophils Relative: 1 %
Eosinophils Absolute: 0.1 10*3/uL (ref 0.0–0.5)
Eosinophils Relative: 2 %
HCT: 35.1 % — ABNORMAL LOW (ref 39.0–52.0)
Hemoglobin: 12.3 g/dL — ABNORMAL LOW (ref 13.0–17.0)
Immature Granulocytes: 1 %
Lymphocytes Relative: 26 %
Lymphs Abs: 1.1 10*3/uL (ref 0.7–4.0)
MCH: 42.1 pg — ABNORMAL HIGH (ref 26.0–34.0)
MCHC: 35 g/dL (ref 30.0–36.0)
MCV: 120.2 fL — ABNORMAL HIGH (ref 80.0–100.0)
Monocytes Absolute: 0.3 10*3/uL (ref 0.1–1.0)
Monocytes Relative: 8 %
Neutro Abs: 2.7 10*3/uL (ref 1.7–7.7)
Neutrophils Relative %: 62 %
Platelets: 394 10*3/uL (ref 150–400)
RBC: 2.92 MIL/uL — ABNORMAL LOW (ref 4.22–5.81)
RDW: 14 % (ref 11.5–15.5)
WBC: 4.3 10*3/uL (ref 4.0–10.5)
nRBC: 0 % (ref 0.0–0.2)

## 2020-10-27 ENCOUNTER — Telehealth: Payer: Self-pay

## 2020-10-27 MED ORDER — HYDROXYUREA 500 MG PO CAPS: ORAL_CAPSULE | ORAL | 4 refills | Status: DC

## 2020-10-27 NOTE — Telephone Encounter (Signed)
Left message for pt. Per Dr. Jana Hakim : lab work looks great and to continue taking Hydrea.

## 2020-11-04 ENCOUNTER — Encounter: Payer: Self-pay | Admitting: Oncology

## 2020-11-11 ENCOUNTER — Telehealth: Payer: Self-pay | Admitting: Internal Medicine

## 2020-11-11 NOTE — Telephone Encounter (Signed)
Patient states he would prefer a call back in the afternoon because he wakes up late.

## 2020-11-11 NOTE — Telephone Encounter (Signed)
Left message to call back  

## 2020-11-11 NOTE — Telephone Encounter (Signed)
Patient is returning call from Dr. Jackalyn Lombard nurse (see 11/04/20 patient message).

## 2020-11-13 NOTE — Telephone Encounter (Signed)
The patient has been notified of the result and verbalized understanding.  All questions (if any) were answered. Darrell Jewel, RN 11/13/2020 4:37 PM

## 2020-11-19 ENCOUNTER — Ambulatory Visit (HOSPITAL_COMMUNITY)
Admission: RE | Admit: 2020-11-19 | Discharge: 2020-11-19 | Disposition: A | Discharge status: Home / Self Care | Payer: Medicare HMO | Source: Ambulatory Visit | Attending: Nurse Practitioner | Admitting: Nurse Practitioner

## 2020-11-19 ENCOUNTER — Encounter (HOSPITAL_COMMUNITY): Payer: Self-pay | Admitting: Nurse Practitioner

## 2020-11-19 ENCOUNTER — Other Ambulatory Visit: Payer: Self-pay

## 2020-11-19 VITALS — BP 110/60 | HR 85 | Ht 71.5 in | Wt 293.0 lb

## 2020-11-19 DIAGNOSIS — I480 Paroxysmal atrial fibrillation: Secondary | ICD-10-CM | POA: Diagnosis not present

## 2020-11-19 DIAGNOSIS — Z6841 Body Mass Index (BMI) 40.0 and over, adult: Secondary | ICD-10-CM | POA: Diagnosis not present

## 2020-11-19 DIAGNOSIS — I5022 Chronic systolic (congestive) heart failure: Secondary | ICD-10-CM | POA: Insufficient documentation

## 2020-11-19 DIAGNOSIS — I4891 Unspecified atrial fibrillation: Secondary | ICD-10-CM

## 2020-11-19 DIAGNOSIS — Z79899 Other long term (current) drug therapy: Secondary | ICD-10-CM | POA: Diagnosis not present

## 2020-11-19 DIAGNOSIS — Z8616 Personal history of COVID-19: Secondary | ICD-10-CM | POA: Diagnosis not present

## 2020-11-19 DIAGNOSIS — E1151 Type 2 diabetes mellitus with diabetic peripheral angiopathy without gangrene: Secondary | ICD-10-CM | POA: Diagnosis not present

## 2020-11-19 DIAGNOSIS — E785 Hyperlipidemia, unspecified: Secondary | ICD-10-CM | POA: Insufficient documentation

## 2020-11-19 DIAGNOSIS — I110 Hypertensive heart disease with heart failure: Secondary | ICD-10-CM | POA: Diagnosis not present

## 2020-11-19 DIAGNOSIS — Z7901 Long term (current) use of anticoagulants: Secondary | ICD-10-CM | POA: Diagnosis not present

## 2020-11-19 DIAGNOSIS — G4733 Obstructive sleep apnea (adult) (pediatric): Secondary | ICD-10-CM | POA: Insufficient documentation

## 2020-11-19 DIAGNOSIS — Z96653 Presence of artificial knee joint, bilateral: Secondary | ICD-10-CM | POA: Diagnosis not present

## 2020-11-19 DIAGNOSIS — E1140 Type 2 diabetes mellitus with diabetic neuropathy, unspecified: Secondary | ICD-10-CM | POA: Insufficient documentation

## 2020-11-19 DIAGNOSIS — Z7182 Exercise counseling: Secondary | ICD-10-CM | POA: Diagnosis not present

## 2020-11-19 DIAGNOSIS — Z87891 Personal history of nicotine dependence: Secondary | ICD-10-CM | POA: Insufficient documentation

## 2020-11-19 DIAGNOSIS — I4819 Other persistent atrial fibrillation: Secondary | ICD-10-CM

## 2020-11-19 NOTE — Telephone Encounter (Signed)
The patient has been notified of the result and verbalized understanding.  All questions (if any) were answered. Darrell Jewel, RN 11/19/2020 3:59 PM

## 2020-11-19 NOTE — Progress Notes (Addendum)
Primary Care Physician: [name], MD Referring Physician: Dr. Rosealee Albee [name] is a 80 y.o. male with a h/o paroxysmalatrial fibrillation, chronic diastolic CHF, CVA, OSA, HTN, DM2, PAD, PVD and HLD. Hepreviously wore long-term ZIO monitor which showed PAF and paroxysmal atrial flutter with a burden at 34%, also with postconversion pauses up to 3.4 seconds with persistent bradycardia and rare PVCs. He was then referred to Dr. Rayann Heman and felt not to be a candidate for AF ablation and and not a PPM candidate. Antiarrythmic's were discussed but Dr. Rayann Heman felt that with his non compliant lifestyle, he was not sure that this would even  be successful to restore and maintain SR. He has been rate controlled. Dr. Rayann Heman  felt his noncompliant diet with high sodium content, obesity, non use of CPAP was more likely the culprit for his diastolic HF. Echo was recently update with EF of 60-65% with mild LVH. He was scheduled to have f/u in the afib clinic.   The patient tells me that since Covid, he and his wife got used to watching HBO movies until 1-2 am and then sleep to 12-2 pm. He does not eat a lot of fast food but does eat a lot of fruit and fruit juice. No significant alcohol. He has neuropathy which limits his activity and he has had some recent  Falls.He walks with a cane.   He used to be a Conservation officer, historic buildings and enjoyed exercising in the remote past. He also says that Afib does not bother him. He is rate controlled. He does have some chronic LLE. He does not use cpap and sleeps in a recliner. He is not very motivated to change his lifestyle. He is on torsemide for LLE. He is on eliquis 5 mg bid for a CHA2DS2VASc score of at least 7.   Today, he denies symptoms of palpitations, chest pain, shortness of breath, orthopnea, PND, lower extremity edema, dizziness, presyncope, syncope, or neurologic sequela. The patient is tolerating medications without difficulties and is otherwise without complaint  today.   Past Medical History:  Diagnosis Date  . Anxiety   . Arthritis   . Basal cell carcinoma 07/18/1991   Left nasal brdige (MOHS)  . Basal cell carcinoma 01/23/1992   lower right back-(CX35FU)  . Basal cell carcinoma 05/20/2003   sup-left back (CX35FU)  . Basal cell carcinoma 07/28/2011   post lower neck  . Basal cell carcinoma 08/20/2008   right sideburn(MOHS), sup-Left upper back (CX35FU), sup-mid back (CX35FU), nod-Right lower back )CX35FU), nod-right upperarm (CX35FU)  . Basal cell carcinoma 06/08/2016   sup-Left upper back (CX35FU), mid back (CX35FU), right lower back (CX35FU), nod-Right upperarm (CX35FU)  . Cancer (HCC)    skin - basil cell  . Depression   . Diabetes mellitus without complication ([place])   . Dysrhythmia    a-fib  . GERD (gastroesophageal reflux disease)   . Hyperlipidemia   . Hypertension   . Neuropathy   . Obesity   . Paroxysmal atrial fibrillation (HCC)   . Peripheral vascular disease ([place])    diabetic neuropathy in both feet  . 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   in situ left forearm posterior tx after biopsy   . Sleep apnea    uses C-pap machine  . Squamous cell carcinoma of skin 02/11/2013   in situ-Right temple (CX35FU)  . Squamous cell carcinoma of skin 08/20/2008   in situ- front scalp (CX35FU)  . Squamous cell carcinoma of  skin 06/08/2016   in situ-front scalp (CX35FU)  . Squamous cell carcinoma of skin 02/05/2019   in situ-right sideburn-sup (CX35FU), in situ-right sideburn,inf (CX35FU)  . Stroke [facility])    08/09/2017   Past Surgical History:  Procedure Laterality Date  . APPENDECTOMY  1962  . BACK SURGERY  00-02-12   x3  . BASAL CELL CARCINOMA EXCISION  93/06/10  . COLONOSCOPY    . KNEE ARTHROSCOPY  005/01/02  . 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 TOTAL KNEE ARTHROPLASTY  Right 10/08/2018   Procedure: RIGHT TOTAL KNEE ARTHROPLASTY;  Surgeon: [name], MD;  Location: [facility];  Service: Orthopedics;  Laterality: Right;  55min      No Known Allergies  Social History   Socioeconomic History  . Marital status: Married    Spouse name: Not on file  . Number of children: Not on file  . Years of education: Not on file  . Highest education level: Not on file  Occupational History  . Not on file  Tobacco Use  . Smoking status: Former Smoker    Packs/day: 1.00    Years: 30.00    Pack years: 30.00    Types: Cigars    Quit date: 08/02/1992    Years since quitting: 28.3  . Smokeless tobacco: Never Used  Vaping Use  . Vaping Use: Never used  Substance and Sexual Activity  . Alcohol use: Yes    Alcohol/week: 0.0 standard drinks    Comment: 1 beer a night.   . Drug use: No  . Sexual activity: Not on file  Other Topics Concern  . Not on file  Social History Narrative  . Not on file   Social Determinants of Health   Financial Resource Strain: Not on file  Food Insecurity: Not on file  Transportation Needs: Not on file  Physical Activity: Not on file  Stress: Not on file  Social Connections: Not on file  Intimate Partner Violence: Not on file    Family History  Problem Relation Age of Onset  . Cancer Mother   . Diabetes Mellitus II Mother   . Hypertension Mother   . Heart failure Father   . CVA Father   . Hypertension Sister   . Colon cancer Neg Hx     ROS- All systems are reviewed and negative except as per the HPI above  Physical Exam: There were no vitals filed for this visit. Wt Readings from Last 3 Encounters:  08/21/20 134.4 kg  08/12/20 132 kg  07/28/20 131.2 kg    Labs: Lab Results  Component Value Date   NA 140 10/26/2020   K 4.9 10/26/2020   CL 102 10/26/2020   CO2 31 10/26/2020   GLUCOSE 158 (H) 10/26/2020   BUN 13 10/26/2020   CREATININE 1.11 10/26/2020   CALCIUM 9.2 10/26/2020   MG 1.9 10/13/2018   Lab  Results  Component Value Date   INR 1.23 10/03/2018   No results found for: CHOL, HDL, LDLCALC, TRIG   GEN- The patient is well appearing, alert and oriented x 3 today.   Head- normocephalic, atraumatic Eyes-  Sclera clear, conjunctiva pink Ears- hearing intact Oropharynx- clear Neck- supple, no JVP Lymph- no cervical lymphadenopathy Lungs- Clear to ausculation bilaterally, normal work of breathing Heart- irregular rate and rhythm, no murmurs, rubs or gallops, PMI not laterally displaced GI- soft,  NT, ND, + BS Extremities- no clubbing, cyanosis, or 1+ edema  around ankle area  MS- no significant deformity or atrophy Skin- no rash or lesion Psych- euthymic mood, full affect Neuro- strength and sensation are intact  EKG-afib at 85 bpm   Echo-1. Left ventricular ejection fraction, by estimation, is 60 to 65%. The  left ventricle has normal function. The left ventricle has no regional  wall motion abnormalities. There is mild concentric left ventricular  hypertrophy. Left ventricular diastolic  parameters are indeterminate.  2. Right ventricular systolic function is normal. The right ventricular  size is mildly enlarged.  3. The mitral valve is normal in structure. No evidence of mitral valve  regurgitation.  4. The aortic valve is tricuspid. There is mild calcification of the  aortic valve. Aortic valve regurgitation is not visualized. No aortic  stenosis is present.  5. Aortic dilatation noted. There is borderline dilatation of the aortic  root, measuring 38 mm. There is borderline dilatation of the ascending  aorta, measuring 39 mm.    Assessment and Plan: 1.  afib/flutter  He appears to have persistent afib as of late He is rate controlled and not symptomatic He has lifestyle issues that are contributing to afib and HF and he is not terribly motivated to change [facility] is [facility] He does have neuropathy with h/o of falls so his  program would have to be modified with this in mind  Diet modification for morbid obesity also discussed as his diet sounds high in fruit with low protein intake.   Patients with BMI >30 have a 31% increase in AF recurrence post ablation when compared to those with BMI <25. Lifestyle modification is advised Could consider AAD's but as Dr. Rayann Heman discussed in his note, the ability to restore and maintain SR would be low unless lifestyle was changed  He states that he just cannot tolerate cpap and is not interested  in an oral  appliance   afib clinic/ Dr. Rayann Heman as needed Dr. Tamala Julian as scheduled  Referred to the [facility] exercise program   Butch Penny C. Caspar Favila, [facility] [address] [place], [place] [address] [phone]

## 2020-11-20 ENCOUNTER — Telehealth: Payer: Self-pay

## 2020-11-20 NOTE — Telephone Encounter (Signed)
VMT pt requesting call back to discuss referral to PREP Previously referred last year but did not complete.

## 2020-11-25 DIAGNOSIS — H43813 Vitreous degeneration, bilateral: Secondary | ICD-10-CM | POA: Diagnosis not present

## 2020-11-25 DIAGNOSIS — E113552 Type 2 diabetes mellitus with stable proliferative diabetic retinopathy, left eye: Secondary | ICD-10-CM | POA: Diagnosis not present

## 2020-11-25 DIAGNOSIS — Z794 Long term (current) use of insulin: Secondary | ICD-10-CM | POA: Diagnosis not present

## 2020-11-25 DIAGNOSIS — E113591 Type 2 diabetes mellitus with proliferative diabetic retinopathy without macular edema, right eye: Secondary | ICD-10-CM | POA: Diagnosis not present

## 2020-11-25 DIAGNOSIS — H35373 Puckering of macula, bilateral: Secondary | ICD-10-CM | POA: Diagnosis not present

## 2020-12-09 DIAGNOSIS — H524 Presbyopia: Secondary | ICD-10-CM | POA: Diagnosis not present

## 2020-12-09 DIAGNOSIS — H5211 Myopia, right eye: Secondary | ICD-10-CM | POA: Diagnosis not present

## 2020-12-09 DIAGNOSIS — H5202 Hypermetropia, left eye: Secondary | ICD-10-CM | POA: Diagnosis not present

## 2020-12-09 DIAGNOSIS — H52203 Unspecified astigmatism, bilateral: Secondary | ICD-10-CM | POA: Diagnosis not present

## 2020-12-11 DIAGNOSIS — N1831 Chronic kidney disease, stage 3a: Secondary | ICD-10-CM | POA: Diagnosis not present

## 2020-12-11 DIAGNOSIS — I10 Essential (primary) hypertension: Secondary | ICD-10-CM | POA: Diagnosis not present

## 2020-12-11 DIAGNOSIS — E1140 Type 2 diabetes mellitus with diabetic neuropathy, unspecified: Secondary | ICD-10-CM | POA: Diagnosis not present

## 2020-12-11 DIAGNOSIS — E785 Hyperlipidemia, unspecified: Secondary | ICD-10-CM | POA: Diagnosis not present

## 2020-12-11 DIAGNOSIS — I480 Paroxysmal atrial fibrillation: Secondary | ICD-10-CM | POA: Diagnosis not present

## 2020-12-11 DIAGNOSIS — E113519 Type 2 diabetes mellitus with proliferative diabetic retinopathy with macular edema, unspecified eye: Secondary | ICD-10-CM | POA: Diagnosis not present

## 2020-12-11 DIAGNOSIS — E1142 Type 2 diabetes mellitus with diabetic polyneuropathy: Secondary | ICD-10-CM | POA: Diagnosis not present

## 2020-12-11 DIAGNOSIS — I700 Atherosclerosis of aorta: Secondary | ICD-10-CM | POA: Diagnosis not present

## 2020-12-11 DIAGNOSIS — I639 Cerebral infarction, unspecified: Secondary | ICD-10-CM | POA: Diagnosis not present

## 2020-12-24 DIAGNOSIS — N401 Enlarged prostate with lower urinary tract symptoms: Secondary | ICD-10-CM | POA: Diagnosis not present

## 2020-12-24 DIAGNOSIS — R3915 Urgency of urination: Secondary | ICD-10-CM | POA: Diagnosis not present

## 2020-12-31 ENCOUNTER — Telehealth: Payer: Self-pay | Admitting: Internal Medicine

## 2020-12-31 DIAGNOSIS — I130 Hypertensive heart and chronic kidney disease with heart failure and stage 1 through stage 4 chronic kidney disease, or unspecified chronic kidney disease: Secondary | ICD-10-CM | POA: Diagnosis not present

## 2020-12-31 DIAGNOSIS — I700 Atherosclerosis of aorta: Secondary | ICD-10-CM | POA: Diagnosis not present

## 2020-12-31 DIAGNOSIS — E1142 Type 2 diabetes mellitus with diabetic polyneuropathy: Secondary | ICD-10-CM | POA: Diagnosis not present

## 2020-12-31 DIAGNOSIS — I639 Cerebral infarction, unspecified: Secondary | ICD-10-CM | POA: Diagnosis not present

## 2020-12-31 DIAGNOSIS — N1831 Chronic kidney disease, stage 3a: Secondary | ICD-10-CM | POA: Diagnosis not present

## 2020-12-31 DIAGNOSIS — I480 Paroxysmal atrial fibrillation: Secondary | ICD-10-CM | POA: Diagnosis not present

## 2020-12-31 DIAGNOSIS — E1140 Type 2 diabetes mellitus with diabetic neuropathy, unspecified: Secondary | ICD-10-CM | POA: Diagnosis not present

## 2020-12-31 DIAGNOSIS — R601 Generalized edema: Secondary | ICD-10-CM | POA: Diagnosis not present

## 2020-12-31 DIAGNOSIS — E11319 Type 2 diabetes mellitus with unspecified diabetic retinopathy without macular edema: Secondary | ICD-10-CM | POA: Diagnosis not present

## 2020-12-31 NOTE — Telephone Encounter (Signed)
Dr. Rayann Heman reached out to his nurse Otila Kluver due to Dr. Forde Dandy reaching out to him about pt having pitting edema in legs and right sided heart failure.  Dr. Rayann Heman wanted pt seen ASAP.  Called pt and offered an appt tomorrow morning with Tommye Standard.  Pt refused to come into the office at 8:15am.  Offered appt on Tuesday with Oda Kilts, PA-C.  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  Discussed with pt the importance of being seen as soon as possible so we can get him evaluated and adjust medications.  Pt adamant that he cannot come in tomorrow of Tuesday.  Scheduled pt to see Oda Kilts, PA-C on Wednesday for eval.  Advised pt to follow the instructions given to him about increased doses of Torsemide from Dr. Forde Dandy today.  Pt verbalized understanding and was in agreement with plan.

## 2021-01-06 ENCOUNTER — Other Ambulatory Visit: Payer: Self-pay

## 2021-01-06 ENCOUNTER — Ambulatory Visit: Payer: Medicare HMO | Admitting: Student

## 2021-01-06 ENCOUNTER — Encounter: Payer: Self-pay | Admitting: Student

## 2021-01-06 VITALS — BP 140/62 | HR 52 | Ht 71.5 in | Wt 293.0 lb

## 2021-01-06 DIAGNOSIS — I5032 Chronic diastolic (congestive) heart failure: Secondary | ICD-10-CM | POA: Diagnosis not present

## 2021-01-06 DIAGNOSIS — I480 Paroxysmal atrial fibrillation: Secondary | ICD-10-CM | POA: Diagnosis not present

## 2021-01-06 MED ORDER — TORSEMIDE 20 MG PO TABS: 40.0000 mg | ORAL_TABLET | Freq: Every day | ORAL | 3 refills | Status: DC

## 2021-01-06 NOTE — Patient Instructions (Signed)
Medication Instructions:  Your physician has recommended you make the following change in your medication:   CHANGE: Torsemide - take 60mg  daily for 3 days then take 40mg  daily.   *If you need a refill on your cardiac medications before your next appointment, please call your pharmacy*   Lab Work: TODAY: BMET, BNP  If you have labs (blood work) drawn today and your tests are completely normal, you will receive your results only by: [name] Kitchen MyChart Message (if you have MyChart) OR . A paper copy in the mail If you have any lab test that is abnormal or we need to change your treatment, we will call you to review the results.   Follow-Up: At [facility], you and your health needs are our priority.  As part of our continuing mission to provide you with exceptional heart care, we have created designated Provider Care Teams.  These Care Teams include your primary Cardiologist (physician) and Advanced Practice Providers (APPs -  Physician Assistants and Nurse Practitioners) who all work together to provide you with the care you need, when you need it.  Your next appointment:   As scheduled   Other Instructions Slippie Gator Compression Hose No more than 2,000mg  of Sodium daily No more than 2,000L of fluids daily

## 2021-01-06 NOTE — Progress Notes (Signed)
PCP:  [name], MD Primary Cardiologist: [name], MD Electrophysiologist: Thompson Grayer, MD   [name] is a 80 y.o. male with a h/o paroxysmalatrial fibrillation, chronic diastolic CHF, CVA, OSA, HTN, DM2, PAD, PVD and HLD seen today for Thompson Grayer, MD for acute visit due to CHF symptoms.    Previously seen by Dr. Rayann Heman and thought poor candidate for AF ablation and not PPM candidate. AADs were thought to be risk > benefit with non-compliance.  Seen in AF clinic 11/19/20.   Since last being seen in our clinic the patient reports doing about the same. He feels like his edema has been at this level for "months" at least. He hasn't noticed a specific difference on the increased torsemide. He has been taking 40 mg q am and 20 mg q pm. He feels he drinks less than 2L of fluid daily. He has a very high sodium intake. His wife has lost the strength in her hands so they eat out very frequently. Had BBQ yesterday and most often go to [employer]. He denies chest pain, orthopnea, PND, or dyspnea on exertion. He is mostly limited by leg fatigue and imbalance from his neuropathy. He is not very active. He walks with a single pronged cane. He is not interested in CPAP or pursuing alternate mask options. He has had bilateral knee surgeries in the past.   Past Medical History:  Diagnosis Date  . Anxiety   . Arthritis   . Basal cell carcinoma 07/18/1991   Left nasal brdige (MOHS)  . Basal cell carcinoma 01/23/1992   lower right back-(CX35FU)  . Basal cell carcinoma 05/20/2003   sup-left back (CX35FU)  . Basal cell carcinoma 07/28/2011   post lower neck  . Basal cell carcinoma 08/20/2008   right sideburn(MOHS), sup-Left upper back (CX35FU), sup-mid back (CX35FU), nod-Right lower back )CX35FU), nod-right upperarm (CX35FU)  . Basal cell carcinoma 06/08/2016   sup-Left upper back (CX35FU), mid back (CX35FU), right lower back (CX35FU), nod-Right upperarm (CX35FU)  . Cancer (HCC)     skin - basil cell  . Depression   . Diabetes mellitus without complication ([place])   . Dysrhythmia    a-fib  . GERD (gastroesophageal reflux disease)   . Hyperlipidemia   . Hypertension   . Neuropathy   . Obesity   . Paroxysmal atrial fibrillation (HCC)   . Peripheral vascular disease ([place])    diabetic neuropathy in both feet  . 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   in situ left forearm posterior tx after biopsy   . Sleep apnea    uses C-pap machine  . Squamous cell carcinoma of skin 02/11/2013   in situ-Right temple (CX35FU)  . Squamous cell carcinoma of skin 08/20/2008   in situ- front scalp (CX35FU)  . Squamous cell carcinoma of skin 06/08/2016   in situ-front scalp (CX35FU)  . Squamous cell carcinoma of skin 02/05/2019   in situ-right sideburn-sup (CX35FU), in situ-right sideburn,inf (CX35FU)  . Stroke [facility])    08/09/2017   Past Surgical History:  Procedure Laterality Date  . APPENDECTOMY  1962  . BACK SURGERY  00-02-12   x3  . BASAL CELL CARCINOMA EXCISION  93/06/10  . COLONOSCOPY    . KNEE ARTHROSCOPY  005/01/02  . 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 TOTAL KNEE ARTHROPLASTY Right 10/08/2018  Procedure: RIGHT TOTAL KNEE ARTHROPLASTY;  Surgeon: [name], MD;  Location: [facility];  Service: Orthopedics;  Laterality: Right;  30min    Current Outpatient Medications  Medication Sig Dispense Refill  . ALPRAZolam (XANAX) 0.5 MG tablet Take 0.5 mg by mouth 2 (two) times daily.    [name] Kitchen atorvastatin (LIPITOR) 80 MG tablet Take 1 tablet (80 mg total) by mouth daily at 6 PM. 30 tablet 0  . benazepril (LOTENSIN) 40 MG tablet Take 1 tablet (40 mg total) by mouth daily.    [name] Kitchen ELIQUIS 5 MG TABS tablet Take 1 tablet by mouth twice daily 60 tablet 5  . ergocalciferol (VITAMIN D2) 50000 units capsule Take 50,000 Units by mouth once  a week.     . gabapentin (NEURONTIN) 600 MG tablet Take 600 mg by mouth 2 (two) times daily.    [name] Kitchen glucose blood (ONETOUCH ULTRA) test strip USE 1 STRIP TO CHECK GLUCOSE 4 TIMES DAILY    . hydroxyurea (HYDREA) 500 MG capsule TAKE 2 CAPSULES BY MOUTH IN THE MORNING AND 1 IN THE EVENING 270 capsule 4  . insulin NPH-regular Human (NOVOLIN 70/30) (70-30) 100 UNIT/ML injection Inject 44 Units into the skin 2 (two) times daily with a meal.    . levothyroxine (SYNTHROID) 75 MCG tablet TAKE 1 TABLET BY MOUTH IN THE MORNING ON AN EMPTY STOMACH 30 MINUTES BEFORE MEAL(S) AND OTHER MEDS    . metFORMIN (GLUCOPHAGE) 1000 MG tablet Take 500 mg by mouth 2 (two) times daily.    . metoprolol tartrate (LOPRESSOR) 50 MG tablet Take 50 mg by mouth 2 (two) times daily.    . Multiple Vitamin (MULTIVITAMIN WITH MINERALS) TABS tablet Take 1 tablet by mouth daily.    [name] Kitchen omeprazole (PRILOSEC) 20 MG capsule Take 20 mg by mouth daily.    Glory Rosebush VERIO test strip SMARTSIG:Via Meter    . tamsulosin (FLOMAX) 0.4 MG CAPS capsule Take 0.4 mg by mouth daily.    [name] Kitchen torsemide (DEMADEX) 20 MG tablet TAKE 2 TABLETS BY MOUTH ONCE DAILY.  STOP FUROSEMIDE  180 tablet 3  . venlafaxine XR (EFFEXOR-XR) 75 MG 24 hr capsule Take 75 mg by mouth daily.     No current facility-administered medications for this visit.    Allergies  Allergen Reactions  . Penicillin G Sodium Other (See Comments)    Social History   Socioeconomic History  . Marital status: Married    Spouse name: Not on file  . Number of children: Not on file  . Years of education: Not on file  . Highest education level: Not on file  Occupational History  . Not on file  Tobacco Use  . Smoking status: Former Smoker    Packs/day: 1.00    Years: 30.00    Pack years: 30.00    Types: Cigars    Quit date: 08/02/1992    Years since quitting: 28.4  . Smokeless tobacco: Never Used  Vaping Use  . Vaping Use: Never used  Substance and Sexual Activity  . Alcohol use:  Yes    Alcohol/week: 0.0 standard drinks    Comment: 1 beer a night.   . Drug use: No  . Sexual activity: Not on file  Other Topics Concern  . Not on file  Social History Narrative  . Not on file   Social Determinants of Health   Financial Resource Strain: Not on file  Food Insecurity: Not on file  Transportation Needs: Not on file  Physical Activity: Not on  file  Stress: Not on file  Social Connections: Not on file  Intimate Partner Violence: Not on file     Review of Systems: General: No chills, fever, night sweats or weight changes  Cardiovascular:  No chest pain, dyspnea on exertion, edema, orthopnea, palpitations, paroxysmal nocturnal dyspnea Dermatological: No rash, lesions or masses Respiratory: No cough, dyspnea Urologic: No hematuria, dysuria Abdominal: No nausea, vomiting, diarrhea, bright red blood per rectum, melena, or hematemesis Neurologic: No visual changes, weakness, changes in mental status All other systems reviewed and are otherwise negative except as noted above.  Physical Exam: Vitals:   01/06/21 1116  BP: 140/62  Pulse: (!) 52  SpO2: 96%  Weight: 293 lb (132.9 kg)  Height: 5' 11.5" (1.816 m)   Wt Readings from Last 3 Encounters:  01/06/21 293 lb (132.9 kg)  11/19/20 293 lb (132.9 kg)  08/21/20 296 lb 3.2 oz (134.4 kg)    GEN- The patient is well appearing, alert and oriented x 3 today.   HEENT: normocephalic, atraumatic; sclera clear, conjunctiva pink; hearing intact; oropharynx clear; neck supple, no JVP Lymph- no cervical lymphadenopathy Lungs- Clear to ausculation bilaterally, normal work of breathing.  No wheezes, rales, rhonchi Heart- Regular rate and rhythm, no murmurs, rubs or gallops, PMI not laterally displaced GI- obese, soft, non-tender, non-distended, bowel sounds present, no hepatosplenomegaly Extremities- no clubbing or cyanosis; 2+ pitting edema 1/2 way to knee. 1+ dependent thigh edema.  MS- no significant deformity or  atrophy Skin- warm and dry, no rash or lesion Psych- euthymic mood, full affect Neuro- strength and sensation are intact  EKG is ordered. Personal review of EKG from today shows sinus bradycardia at 52 bpm and PACs  Additional studies reviewed include: Previous EP and PCP office notes  Assessment and Plan:  1. Acute on chronic diastolic dysfunction Echo 10/16/20 LVEF 60-65% He does not limit sodium, intolerant/non-compliant with CPAP, and is morbidly obese.  We are not convinced that afib is the driver for his diastolic dysfunction. I suspect there is a major component of chronic venous stasis We discussed at length sodium restrictions and recommendations for diet and exercise. He frequently eats out and was given several examples of sodium content of his usual dishes (I.e his usual order at [employer] has > 2000 mg sodium in one sitting) Encouraged diet and weight loss.    Given example of compression hose assistance tool and name of "Slippie Gator" to try and encourage compression hose use As previous, he is not interested in pursuing CPAP at all.  Encouraged him to take torsemide 60 mg daily x 3 days with labs today and then return torsemide to 40 mg daily. Discussed today that lifestyle modification is the most likely course to benefit him and his edema.  His weight has been 290-300 since at least 2020, so likely chronic component.    2. Paroxysmal atrial fibrillation/ atrial flutter He is in sinus brady today He has had symptomatic, recurrent paroxysmal atrial fibrillation. He has not been on AAD, and as below, we do not feel as AF is the primary driver of his symptoms.  Continue eliquis or CHA2DS2VASC of at least 8.    His multiple AF risk factors that have not been modified would lead to a very low anticipated success with ablation without substantial lifestyle change. We could consider AADs, though without lifestyle/ dietary changes, low likelihood his edema would improve.    Apart from edema, he is relatively asymptomatic otherwise.  Per previous discussions could  consider tikosyn or amiodarone if he does not have improvement in his diastolic CHF with lifestyle/ dietary change.  3. Morbid obesity Body mass index is 40.3 kg/m.  The odds of success of AF ablation have been explored and reviewed with the patient at length by Dr. Rayann Heman as below.  Per Guijian et al (PACE 2013; 36: 621-308), patients with BMI 25-29.9 (obese) have a 27% increase in AF recurrence post ablation.  Patients with BMI >30 have a 31% increase in AF recurrence post ablation when compared to those with BMI <25. As above, we reviewed lifestyle modification at length.   4. OSA Not tolerant of CPAP. We have recommended he and his treating physician revisit.   5. HTN Continue current recommendation.  RTC 4-6 weeks to re-assess. Strongly encouraged lifestyle modification as best course. Specifically, sodium restriction and compression hose use coupled with exercise and weight loss. Labs today  Satira Mccallum [place], [place]  01/06/21 11:20 AM

## 2021-01-07 LAB — PRO B NATRIURETIC PEPTIDE: NT-Pro BNP: 300 pg/mL (ref 0–486)

## 2021-01-07 LAB — BASIC METABOLIC PANEL
BUN/Creatinine Ratio: 17 (ref 10–24)
BUN: 18 mg/dL (ref 8–27)
CO2: 26 mmol/L (ref 20–29)
Calcium: 9.4 mg/dL (ref 8.6–10.2)
Chloride: 98 mmol/L (ref 96–106)
Creatinine, Ser: 1.08 mg/dL (ref 0.76–1.27)
Glucose: 226 mg/dL — ABNORMAL HIGH (ref 65–99)
Potassium: 4.9 mmol/L (ref 3.5–5.2)
Sodium: 137 mmol/L (ref 134–144)
eGFR: 71 mL/min/{1.73_m2} (ref >59)

## 2021-01-12 DIAGNOSIS — E113552 Type 2 diabetes mellitus with stable proliferative diabetic retinopathy, left eye: Secondary | ICD-10-CM | POA: Diagnosis not present

## 2021-01-12 DIAGNOSIS — H4312 Vitreous hemorrhage, left eye: Secondary | ICD-10-CM | POA: Diagnosis not present

## 2021-01-12 DIAGNOSIS — Z794 Long term (current) use of insulin: Secondary | ICD-10-CM | POA: Diagnosis not present

## 2021-01-20 ENCOUNTER — Ambulatory Visit: Payer: Medicare HMO | Admitting: Dermatology

## 2021-01-20 ENCOUNTER — Other Ambulatory Visit: Payer: Self-pay

## 2021-01-20 ENCOUNTER — Encounter: Payer: Self-pay | Admitting: Dermatology

## 2021-01-20 DIAGNOSIS — Z85828 Personal history of other malignant neoplasm of skin: Secondary | ICD-10-CM

## 2021-01-20 DIAGNOSIS — Z1283 Encounter for screening for malignant neoplasm of skin: Secondary | ICD-10-CM | POA: Diagnosis not present

## 2021-01-20 DIAGNOSIS — D485 Neoplasm of uncertain behavior of skin: Secondary | ICD-10-CM

## 2021-01-20 DIAGNOSIS — L905 Scar conditions and fibrosis of skin: Secondary | ICD-10-CM

## 2021-01-20 DIAGNOSIS — L570 Actinic keratosis: Secondary | ICD-10-CM | POA: Diagnosis not present

## 2021-01-20 DIAGNOSIS — D0462 Carcinoma in situ of skin of left upper limb, including shoulder: Secondary | ICD-10-CM | POA: Diagnosis not present

## 2021-01-20 DIAGNOSIS — D0461 Carcinoma in situ of skin of right upper limb, including shoulder: Secondary | ICD-10-CM

## 2021-01-20 DIAGNOSIS — C4441 Basal cell carcinoma of skin of scalp and neck: Secondary | ICD-10-CM

## 2021-01-20 DIAGNOSIS — L821 Other seborrheic keratosis: Secondary | ICD-10-CM | POA: Diagnosis not present

## 2021-01-20 DIAGNOSIS — C4491 Basal cell carcinoma of skin, unspecified: Secondary | ICD-10-CM

## 2021-01-20 HISTORY — DX: Basal cell carcinoma of skin, unspecified: C44.91

## 2021-01-20 NOTE — Patient Instructions (Addendum)
Seborrheic Keratosis A seborrheic keratosis is a common, noncancerous (benign) skin growth. These growths are velvety, waxy, rough, tan, brown, or black spots that appear on the skin. These skin growths can be flat or raised, and scaly. What are the causes? The cause of this condition is not known. What increases the risk? You are more likely to develop this condition if you:  Have a family history of seborrheic keratosis.  Are 50 or older.  Are pregnant.  Have had estrogen replacement therapy. What are the signs or symptoms? Symptoms of this condition include growths on the face, chest, shoulders, back, or other areas. These growths:  Are usually painless, but may become irritated and itchy.  Can be yellow, brown, black, or other colors.  Are slightly raised or have a flat surface.  Are sometimes rough or wart-like in texture.  Are often velvety or waxy on the surface.  Are round or oval-shaped.  Often occur in groups, but may occur as a single growth.   How is this diagnosed? This condition is diagnosed with a medical history and physical exam.  A sample of the growth may be tested (skin biopsy).  You may need to see a skin specialist (dermatologist). How is this treated? Treatment is not usually needed for this condition, unless the growths are irritated or bleed often.  You may also choose to have the growths removed if you do not like their appearance. ? Most commonly, these growths are treated with a procedure in which liquid nitrogen is applied to "freeze" off the growth (cryosurgery). ? They may also be burned off with electricity (electrocautery) or removed by scraping (curettage). Follow these instructions at home:  Watch your growth for any changes.  Keep all follow-up visits as told by your health care provider. This is important.  Do not scratch or pick at the growth or growths. This can cause them to become irritated or infected. Contact a health care  provider if:  You suddenly have many new growths.  Your growth bleeds, itches, or hurts.  Your growth suddenly becomes larger or changes color. Summary  A seborrheic keratosis is a common, noncancerous (benign) skin growth.  Treatment is not usually needed for this condition, unless the growths are irritated or bleed often.  Watch your growth for any changes.  Contact a health care provider if you suddenly have many new growths or your growth suddenly becomes larger or changes color.  Keep all follow-up visits as told by your health care provider. This is important. This information is not intended to replace advice given to you by your health care provider. Make sure you discuss any questions you have with your health care provider. Document Revised: 01/11/2018 Document Reviewed: 01/11/2018 Elsevier Patient Education  2021 Elsevier Inc. Biopsy, Surgery (Curettage) & Surgery (Excision) Aftercare Instructions  1. Okay to remove bandage in 24 hours  2. Wash area with soap and water  3. Apply Vaseline to area twice daily until healed (Not Neosporin)  4. Okay to cover with a Band-Aid to decrease the chance of infection or prevent irritation from clothing; also it's okay to uncover lesion at home.  5. Suture instructions: return to our office in 7-10 or 10-14 days for a nurse visit for suture removal. Variable healing with sutures, if pain or itching occurs call our office. It's okay to shower or bathe 24 hours after sutures are given.  6. The following risks may occur after a biopsy, curettage or excision: bleeding, scarring, discoloration, recurrence,   infection (redness, yellow drainage, pain or swelling).  7. For questions, concerns and results call our office at Monday-Thursday before 4pm & Friday before 3pm. Biopsy results will be available in 1 week.   

## 2021-01-25 ENCOUNTER — Telehealth: Payer: Self-pay

## 2021-01-25 NOTE — Telephone Encounter (Signed)
-----   Message from Stuart Tafeen, MD sent at 01/23/2021  8:44 AM EDT ----- Schedule surgery with Dr. T 

## 2021-01-25 NOTE — Telephone Encounter (Signed)
Phone call to patient with his pathology results. Voicemail left for patient to give the office a call back.  ?

## 2021-01-26 NOTE — Telephone Encounter (Signed)
Patient returned call about results.  

## 2021-01-27 NOTE — Telephone Encounter (Signed)
Left msg to call office needs 30 minute with Dr Denna Haggard

## 2021-02-01 ENCOUNTER — Telehealth: Payer: Self-pay

## 2021-02-01 ENCOUNTER — Telehealth: Payer: Self-pay | Admitting: Dermatology

## 2021-02-01 NOTE — Telephone Encounter (Signed)
Phone call from patient returning our call for his pathology results. Patient aware of results.  

## 2021-02-01 NOTE — Telephone Encounter (Signed)
Phone call to patient with his pathology results. Voicemail left for patient to give the office a call back.  ?

## 2021-02-01 NOTE — Telephone Encounter (Signed)
-----   Message from Stuart Tafeen, MD sent at 01/23/2021  8:44 AM EDT ----- Schedule surgery with Dr. T 

## 2021-02-01 NOTE — Telephone Encounter (Signed)
-----   Message from Lavonna Monarch, MD sent at 01/23/2021  8:44 AM EDT ----- Schedule surgery with Dr. Darene Lamer

## 2021-02-01 NOTE — Telephone Encounter (Signed)
Returning our call from today

## 2021-02-03 ENCOUNTER — Encounter: Payer: Self-pay | Admitting: Dermatology

## 2021-02-03 NOTE — Progress Notes (Signed)
Follow-Up Visit   Subjective  [name] is a 80 y.o. male who presents for the following: Annual Exam (Check scalp & arms -Lots of scabs- "I pick them").  Multiple new crusts gentleman with a history of multiple skin cancers.  He does pick at many of the spots. Location:  Duration:  Quality:  Associated Signs/Symptoms: Modifying Factors:  Severity:  Timing: Context:   Objective  Well appearing patient in no apparent distress; mood and affect are within normal limits. Objective  Left Inguinal Area: Skin examination, no recurrent skin cancer or atypical pigmented lesions.  Objective  Left Shoulder - Anterior: Origin stated to be childhood injection   Objective  Left Flank, Mid Back, Scalp: Flattopped 40 mm brown textured papules  Objective  Right Forearm - Posterior: Waxy 1 cm lichenified crust     Objective  Left Forearm - Posterior: Waxy 1 cm lichenified crust     Objective  Scalp: Pearly superficially eroded 1 cm crust     Objective  Head - Anterior (Face) (5), Left Ear, Mid Parietal Scalp (5), Right Ear: [name] has a mixture of classical actinic keratoses plus pickers papules with an overlap of the 2.  In view of his tendency to develop nonmelanoma skin cancers, he requested all such lesions be treated with liquid nitrogen freeze and I am comfortable with this choice.    All skin waist up examined.   Assessment & Plan    Screening exam for skin cancer Left Inguinal Area  Annual skin examination.  Scar Left Shoulder - Anterior  No intervention indicated if stable  Seborrheic keratosis (3) Left Flank; Mid Back; Scalp  No intervention currently necessary  Neoplasm of uncertain behavior of skin (3) Right Forearm - Posterior  Skin / nail biopsy Type of biopsy: tangential   Informed consent: discussed and consent obtained   Timeout: patient name, date of birth, surgical site, and procedure verified   Anesthesia: the lesion  was anesthetized in a standard fashion   Anesthetic:  1% lidocaine w/ epinephrine 1-100,000 local infiltration Instrument used: flexible razor blade   Hemostasis achieved with: aluminum chloride and electrodesiccation   Outcome: patient tolerated procedure well   Post-procedure details: wound care instructions given    Specimen 1 - Surgical pathology Differential Diagnosis: bcc scc  Check Margins: No  Left Forearm - Posterior  Skin / nail biopsy Type of biopsy: tangential   Informed consent: discussed and consent obtained   Timeout: patient name, date of birth, surgical site, and procedure verified   Anesthesia: the lesion was anesthetized in a standard fashion   Anesthetic:  1% lidocaine w/ epinephrine 1-100,000 local infiltration Instrument used: flexible razor blade   Hemostasis achieved with: aluminum chloride and electrodesiccation   Outcome: patient tolerated procedure well   Post-procedure details: wound care instructions given    Specimen 2 - Surgical pathology Differential Diagnosis: bcc scc  Check Margins: No  Scalp  Skin / nail biopsy Type of biopsy: tangential   Informed consent: discussed and consent obtained   Timeout: patient name, date of birth, surgical site, and procedure verified   Anesthesia: the lesion was anesthetized in a standard fashion   Anesthetic:  1% lidocaine w/ epinephrine 1-100,000 local infiltration Instrument used: flexible razor blade   Hemostasis achieved with: aluminum chloride and electrodesiccation   Outcome: patient tolerated procedure well   Post-procedure details: wound care instructions given    Specimen 3 - Surgical pathology Differential Diagnosis: bcc scc  Check Margins: No  AK (actinic keratosis) (12) Head - Anterior (Face) (5); Left Ear; Right Ear; Mid Parietal Scalp (5)  Destruction of lesion - Head - Anterior (Face), Left Ear, Mid Parietal Scalp, Right Ear Complexity: simple   Destruction method: cryotherapy    Informed consent: discussed and consent obtained   Timeout:  patient name, date of birth, surgical site, and procedure verified Lesion destroyed using liquid nitrogen: Yes   Cryotherapy cycles:  3 Outcome: patient tolerated procedure well with no complications   Post-procedure details: wound care instructions given        I, Lavonna Monarch, MD, have reviewed all documentation for this visit.  The documentation on 02/03/21 for the exam, diagnosis, procedures, and orders are all accurate and complete.

## 2021-02-14 ENCOUNTER — Other Ambulatory Visit: Payer: Self-pay | Admitting: Internal Medicine

## 2021-02-15 ENCOUNTER — Other Ambulatory Visit: Payer: Self-pay

## 2021-02-15 ENCOUNTER — Encounter: Payer: Self-pay | Admitting: Student

## 2021-02-15 ENCOUNTER — Ambulatory Visit: Payer: Medicare HMO | Admitting: Student

## 2021-02-15 VITALS — BP 120/68 | HR 71 | Ht 71.5 in | Wt 293.0 lb

## 2021-02-15 DIAGNOSIS — I10 Essential (primary) hypertension: Secondary | ICD-10-CM | POA: Diagnosis not present

## 2021-02-15 DIAGNOSIS — I5032 Chronic diastolic (congestive) heart failure: Secondary | ICD-10-CM

## 2021-02-15 DIAGNOSIS — G4733 Obstructive sleep apnea (adult) (pediatric): Secondary | ICD-10-CM | POA: Diagnosis not present

## 2021-02-15 DIAGNOSIS — I480 Paroxysmal atrial fibrillation: Secondary | ICD-10-CM | POA: Diagnosis not present

## 2021-02-15 NOTE — Patient Instructions (Signed)

## 2021-02-15 NOTE — Progress Notes (Signed)
PCP:  [name], MD Primary Cardiologist: [name], MD Electrophysiologist: Thompson Grayer, MD   [name] is a 80 y.o. male with a h/o paroxysmalatrial fibrillation, chronic diastolic CHF, CVA, OSA, HTN, DM2, PAD, PVD and HLD seen today for Thompson Grayer, MD for acute visit due to CHF symptoms.    Previously seen by Dr. Rayann Heman and thought poor candidate for AF ablation and not PPM candidate. AADs were thought to be risk > benefit with non-compliance.  Seen in AF clinic 11/19/20.   Sen by me 01/06/21, Felt like his edema was stable for "at least" months. Didn't notice a difference with torsemide adjustment and felt he was watching fluid intake. Admitted to high sodium diet and very little exercise. Refused CPAP considerations.    Today he feels about the same. He missed a call from the [facility] and didn't return the call because he can do more aggressive exercising on his own. No exercise so far. He is very interested in the [employer], where your meals are planned out and sent to you.  His wife is watching her sugar closely and using her CPAP, and he comments he doesn't feel like she has made much improvement.   Past Medical History:  Diagnosis Date  . Anxiety   . Arthritis   . Basal cell carcinoma 07/18/1991   Left nasal brdige (MOHS)  . Basal cell carcinoma 01/23/1992   lower right back-(CX35FU)  . Basal cell carcinoma 05/20/2003   sup-left back (CX35FU)  . Basal cell carcinoma 07/28/2011   post lower neck  . Basal cell carcinoma 08/20/2008   right sideburn(MOHS), sup-Left upper back (CX35FU), sup-mid back (CX35FU), nod-Right lower back )CX35FU), nod-right upperarm (CX35FU)  . Basal cell carcinoma 06/08/2016   sup-Left upper back (CX35FU), mid back (CX35FU), right lower back (CX35FU), nod-Right upperarm (CX35FU)  . Cancer (HCC)    skin - basil cell  . Depression   . Diabetes mellitus without complication ([place])   . Dysrhythmia    a-fib  . GERD  (gastroesophageal reflux disease)   . Hyperlipidemia   . Hypertension   . Neuropathy   . Obesity   . Paroxysmal atrial fibrillation (HCC)   . Peripheral vascular disease ([place])    diabetic neuropathy in both feet  . 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   in situ left forearm posterior tx after biopsy   . SCCA (squamous cell carcinoma) of skin 01/20/2021   Right Forearm Posterior (in situ)  . SCCA (squamous cell carcinoma) of skin 01/20/2021   Left Forearm Posterior (in situ)  . Sleep apnea    uses C-pap machine  . Squamous cell carcinoma of skin 02/11/2013   in situ-Right temple (CX35FU)  . Squamous cell carcinoma of skin 08/20/2008   in situ- front scalp (CX35FU)  . Squamous cell carcinoma of skin 06/08/2016   in situ-front scalp (CX35FU)  . Squamous cell carcinoma of skin 02/05/2019   in situ-right sideburn-sup (CX35FU), in situ-right sideburn,inf (CX35FU)  . Stroke ([place])    08/09/2017  . Superficial basal cell carcinoma (BCC) 01/20/2021   Scalp   Past Surgical History:  Procedure Laterality Date  . APPENDECTOMY  1962  . BACK SURGERY  00-02-12   x3  . BASAL CELL CARCINOMA EXCISION  93/06/10  . COLONOSCOPY    . KNEE ARTHROSCOPY  005/01/02  . 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 TOTAL KNEE ARTHROPLASTY Right 10/08/2018   Procedure: RIGHT TOTAL KNEE ARTHROPLASTY;  Surgeon: [name], MD;  Location: [facility];  Service: Orthopedics;  Laterality: Right;  85min    Current Outpatient Medications  Medication Sig Dispense Refill  . ALPRAZolam (XANAX) 0.5 MG tablet Take 0.5 mg by mouth 2 (two) times daily.    [name] Kitchen atorvastatin (LIPITOR) 80 MG tablet Take 1 tablet (80 mg total) by mouth daily at 6 PM. 30 tablet 0  . benazepril (LOTENSIN) 40 MG tablet Take 1 tablet (40 mg total) by mouth daily.    [name] Kitchen ELIQUIS 5 MG TABS tablet Take 1  tablet by mouth twice daily 60 tablet 5  . ergocalciferol (VITAMIN D2) 50000 units capsule Take 50,000 Units by mouth once a week.     . gabapentin (NEURONTIN) 600 MG tablet Take 600 mg by mouth 2 (two) times daily.    [name] Kitchen glucose blood (ONETOUCH ULTRA) test strip USE 1 STRIP TO CHECK GLUCOSE 4 TIMES DAILY    . hydroxyurea (HYDREA) 500 MG capsule TAKE 2 CAPSULES BY MOUTH IN THE MORNING AND 1 IN THE EVENING 270 capsule 4  . insulin NPH-regular Human (NOVOLIN 70/30) (70-30) 100 UNIT/ML injection Inject 44 Units into the skin 2 (two) times daily with a meal.    . levothyroxine (SYNTHROID) 75 MCG tablet TAKE 1 TABLET BY MOUTH IN THE MORNING ON AN EMPTY STOMACH 30 MINUTES BEFORE MEAL(S) AND OTHER MEDS    . metFORMIN (GLUCOPHAGE) 1000 MG tablet Take 500 mg by mouth 2 (two) times daily.    . metoprolol tartrate (LOPRESSOR) 50 MG tablet Take 50 mg by mouth 2 (two) times daily.    . Multiple Vitamin (MULTIVITAMIN WITH MINERALS) TABS tablet Take 1 tablet by mouth daily.    [name] Kitchen omeprazole (PRILOSEC) 20 MG capsule Take 20 mg by mouth daily.    Glory Rosebush VERIO test strip SMARTSIG:Via Meter    . tamsulosin (FLOMAX) 0.4 MG CAPS capsule Take 0.4 mg by mouth daily.    [name] Kitchen torsemide (DEMADEX) 20 MG tablet TAKE 2 TABLETS BY MOUTH ONCE DAILY.  STOP FUROSEMIDE  180 tablet 3  . venlafaxine XR (EFFEXOR-XR) 75 MG 24 hr capsule Take 75 mg by mouth daily.     No current facility-administered medications for this visit.    Allergies  Allergen Reactions  . Penicillin G Sodium Other (See Comments)    Social History   Socioeconomic History  . Marital status: Married    Spouse name: Not on file  . Number of children: Not on file  . Years of education: Not on file  . Highest education level: Not on file  Occupational History  . Not on file  Tobacco Use  . Smoking status: Former Smoker    Packs/day: 1.00    Years: 30.00    Pack years: 30.00    Types: Cigars    Quit date: 08/02/1992    Years since quitting: 28.5   . Smokeless tobacco: Never Used  Vaping Use  . Vaping Use: Never used  Substance and Sexual Activity  . Alcohol use: Yes    Alcohol/week: 0.0 standard drinks    Comment: 1 beer a night.   . Drug use: No  . Sexual activity: Not on file  Other Topics Concern  . Not on file  Social History Narrative  . Not on file   Social Determinants of Health   Financial Resource Strain: Not on file  Food Insecurity:  Not on file  Transportation Needs: Not on file  Physical Activity: Not on file  Stress: Not on file  Social Connections: Not on file  Intimate Partner Violence: Not on file     Review of Systems: Review of systems complete and found to be negative unless listed in HPI.    Physical Exam: Vitals:   02/15/21 1204  BP: 120/68  Pulse: 71  SpO2: 97%  Weight: 293 lb (132.9 kg)  Height: 5' 11.5" (1.816 m)   Wt Readings from Last 3 Encounters:  02/15/21 293 lb (132.9 kg)  01/06/21 293 lb (132.9 kg)  11/19/20 293 lb (132.9 kg)    General: Well appearing. No resp difficulty. HEENT: Normal Neck: Supple. JVP 5-6. Carotids 2+ bilat; no bruits. No thyromegaly or nodule noted. Cor: PMI nondisplaced. RRR, No M/G/R noted Lungs: CTAB, normal effort. Abdomen: Soft, non-tender, non-distended, no HSM. No bruits or masses. +BS  Extremities: No cyanosis, clubbing, or rash. R and LLE no edema.  Neuro: Alert & orientedx3, cranial nerves grossly intact. moves all 4 extremities w/o difficulty. Affect pleasant   EKG is not ordered.  Additional studies reviewed include: Previous EP and PCP office notes  Assessment and Plan:  1. Acute on chronic diastolic dysfunction Echo 10/16/20 LVEF 60-65% He does not limit sodium, intolerant/non-compliant with CPAP, and is morbidly obese.  We are not convinced that afib is the driver for his diastolic dysfunction. I suspect there is a major component of chronic venous stasis Re-encouraged sodium and fluid instructions. Re-encouraged use of  compression hose.  As previous, he is not interested in pursuing CPAP at all.  Continue torsemide 40 mg daily.  Re-discussed today that lifestyle modification is the most likely course to benefit him and his edema.  His weight has been 290-300 since at least 2020, so likely chronic component.    2. Paroxysmal atrial fibrillation/ atrial flutter Remains slow and regular (sinus brady)  He has had symptomatic, recurrent paroxysmal atrial fibrillation. He has not been on AAD, and as below, we do not feel as AF is the primary driver of his symptoms.  Continue eliquis or CHA2DS2VASC of at least 8.    His multiple AF risk factors that have not been modified would lead to a very low anticipated success with ablation without substantial lifestyle change. We could consider AADs, though without lifestyle/ dietary changes, low likelihood his edema would improve.   Apart from edema, he is relatively asymptomatic otherwise.  Per previous discussions could consider tikosyn or amiodarone if he does not have improvement in his diastolic CHF with lifestyle/ dietary change.  3. Morbid obesity Body mass index is 40.3 kg/m.  The odds of success of AF ablation have been explored and reviewed with the patient at length by Dr. Rayann Heman as below.  Per Guijian et al (PACE 2013; 36: 149-702), patients with BMI 25-29.9 (obese) have a 27% increase in AF recurrence post ablation.  Patients with BMI >30 have a 31% increase in AF recurrence post ablation when compared to those with BMI <25. As above, we reviewed lifestyle modification at length.   4. OSA Not tolerant of CPAP and refuses alternate mask options.   5. HTN Continue current recommendation.  RTC 6 months . Re-stressed importance of lifestyle modification. At this juncture, we do not have medical or procedural therapy to offer him from an EP perspective. As above, stressed importance of diet and exercise leading to weight loss. Encouraged him to follow  through with the [facility].  Shirley Friar, PA-C  02/15/21 12:15 PM

## 2021-02-15 NOTE — Telephone Encounter (Signed)
Prescription refill request for Eliquis received. Indication:afib  Last office visit: [place], 01/06/2021 Scr: 1.08, 01/06/2021 Age: 80 yo  Weight: 132.9 kg   Pt is on the correct dose of Eliquis per dosing criteria.

## 2021-02-16 ENCOUNTER — Ambulatory Visit: Payer: Medicare HMO | Admitting: Student

## 2021-02-17 DIAGNOSIS — Z794 Long term (current) use of insulin: Secondary | ICD-10-CM | POA: Diagnosis not present

## 2021-02-17 DIAGNOSIS — H35373 Puckering of macula, bilateral: Secondary | ICD-10-CM | POA: Diagnosis not present

## 2021-02-17 DIAGNOSIS — Z961 Presence of intraocular lens: Secondary | ICD-10-CM | POA: Diagnosis not present

## 2021-02-17 DIAGNOSIS — E113512 Type 2 diabetes mellitus with proliferative diabetic retinopathy with macular edema, left eye: Secondary | ICD-10-CM | POA: Diagnosis not present

## 2021-02-17 DIAGNOSIS — H4312 Vitreous hemorrhage, left eye: Secondary | ICD-10-CM | POA: Diagnosis not present

## 2021-02-17 DIAGNOSIS — H43813 Vitreous degeneration, bilateral: Secondary | ICD-10-CM | POA: Diagnosis not present

## 2021-02-17 DIAGNOSIS — E113591 Type 2 diabetes mellitus with proliferative diabetic retinopathy without macular edema, right eye: Secondary | ICD-10-CM | POA: Diagnosis not present

## 2021-02-22 ENCOUNTER — Other Ambulatory Visit: Payer: Self-pay

## 2021-02-22 ENCOUNTER — Inpatient Hospital Stay: Payer: Medicare HMO | Attending: Oncology

## 2021-02-22 DIAGNOSIS — D473 Essential (hemorrhagic) thrombocythemia: Secondary | ICD-10-CM | POA: Insufficient documentation

## 2021-02-22 LAB — CBC WITH DIFFERENTIAL/PLATELET
Abs Immature Granulocytes: 0.01 10*3/uL (ref 0.00–0.07)
Basophils Absolute: 0 10*3/uL (ref 0.0–0.1)
Basophils Relative: 1 %
Eosinophils Absolute: 0 10*3/uL (ref 0.0–0.5)
Eosinophils Relative: 1 %
HCT: 28.4 % — ABNORMAL LOW (ref 39.0–52.0)
Hemoglobin: 10 g/dL — ABNORMAL LOW (ref 13.0–17.0)
Immature Granulocytes: 0 %
Lymphocytes Relative: 26 %
Lymphs Abs: 1 10*3/uL (ref 0.7–4.0)
MCH: 42.7 pg — ABNORMAL HIGH (ref 26.0–34.0)
MCHC: 35.2 g/dL (ref 30.0–36.0)
MCV: 121.4 fL — ABNORMAL HIGH (ref 80.0–100.0)
Monocytes Absolute: 0.3 10*3/uL (ref 0.1–1.0)
Monocytes Relative: 7 %
Neutro Abs: 2.4 10*3/uL (ref 1.7–7.7)
Neutrophils Relative %: 65 %
Platelets: 348 10*3/uL (ref 150–400)
RBC: 2.34 MIL/uL — ABNORMAL LOW (ref 4.22–5.81)
RDW: 14.7 % (ref 11.5–15.5)
WBC: 3.6 10*3/uL — ABNORMAL LOW (ref 4.0–10.5)
nRBC: 0 % (ref 0.0–0.2)

## 2021-02-22 LAB — COMPREHENSIVE METABOLIC PANEL
ALT: 19 U/L (ref 0–44)
AST: 19 U/L (ref 15–41)
Albumin: 3.3 g/dL — ABNORMAL LOW (ref 3.5–5.0)
Alkaline Phosphatase: 97 U/L (ref 38–126)
Anion gap: 9 (ref 5–15)
BUN: 17 mg/dL (ref 8–23)
CO2: 29 mmol/L (ref 22–32)
Calcium: 9.1 mg/dL (ref 8.9–10.3)
Chloride: 102 mmol/L (ref 98–111)
Creatinine, Ser: 1.16 mg/dL (ref 0.61–1.24)
GFR, Estimated: >60 mL/min (ref >60)
Glucose, Bld: 232 mg/dL — ABNORMAL HIGH (ref 70–99)
Potassium: 5 mmol/L (ref 3.5–5.1)
Sodium: 140 mmol/L (ref 135–145)
Total Bilirubin: 0.6 mg/dL (ref 0.3–1.2)
Total Protein: 6.1 g/dL — ABNORMAL LOW (ref 6.5–8.1)

## 2021-03-31 DIAGNOSIS — H4312 Vitreous hemorrhage, left eye: Secondary | ICD-10-CM | POA: Diagnosis not present

## 2021-03-31 DIAGNOSIS — H35373 Puckering of macula, bilateral: Secondary | ICD-10-CM | POA: Diagnosis not present

## 2021-03-31 DIAGNOSIS — E113512 Type 2 diabetes mellitus with proliferative diabetic retinopathy with macular edema, left eye: Secondary | ICD-10-CM | POA: Insufficient documentation

## 2021-03-31 DIAGNOSIS — Z961 Presence of intraocular lens: Secondary | ICD-10-CM | POA: Diagnosis not present

## 2021-03-31 DIAGNOSIS — E113591 Type 2 diabetes mellitus with proliferative diabetic retinopathy without macular edema, right eye: Secondary | ICD-10-CM | POA: Diagnosis not present

## 2021-03-31 DIAGNOSIS — Z794 Long term (current) use of insulin: Secondary | ICD-10-CM | POA: Diagnosis not present

## 2021-04-01 ENCOUNTER — Ambulatory Visit (INDEPENDENT_AMBULATORY_CARE_PROVIDER_SITE_OTHER): Payer: Medicare HMO | Admitting: Dermatology

## 2021-04-01 ENCOUNTER — Other Ambulatory Visit: Payer: Self-pay

## 2021-04-01 DIAGNOSIS — L821 Other seborrheic keratosis: Secondary | ICD-10-CM | POA: Diagnosis not present

## 2021-04-01 DIAGNOSIS — C4441 Basal cell carcinoma of skin of scalp and neck: Secondary | ICD-10-CM | POA: Diagnosis not present

## 2021-04-01 DIAGNOSIS — D0462 Carcinoma in situ of skin of left upper limb, including shoulder: Secondary | ICD-10-CM | POA: Diagnosis not present

## 2021-04-01 DIAGNOSIS — D0461 Carcinoma in situ of skin of right upper limb, including shoulder: Secondary | ICD-10-CM | POA: Diagnosis not present

## 2021-04-01 NOTE — Patient Instructions (Signed)

## 2021-04-01 NOTE — Progress Notes (Signed)
   Follow-Up Visit   Subjective  [name] is a 80 y.o. male who presents for the following: Procedure (Treatment right forearm CIS, Left forearm posterior CIS, scalp BCC).  3 biopsy-proven skin cancers Location:  Duration:  Quality:  Associated Signs/Symptoms: Modifying Factors:  Severity:  Timing: Context:   Objective  Well appearing patient in no apparent distress; mood and affect are within normal limits.   A focused examination was performed including head, neck, arms. Relevant physical exam findings are noted in the Assessment and Plan.   Assessment & Plan      Retinal surgery scheduled at [facility] scheduled in a little over a week so I completely respect [name]'s preference to defer treating his 3 skin cancers.  Additionally he did express some concern about some discolored areas on the right temple; examination showed for 8 mm monochrome textured tan flattopped seborrheic keratoses with no sign of new or residual skin cancer.    I, Lavonna Monarch, MD, have reviewed all documentation for this visit.  The documentation on 04/01/21 for the exam, diagnosis, procedures, and orders are all accurate and complete.

## 2021-04-07 ENCOUNTER — Encounter: Payer: Medicare HMO | Admitting: Dermatology

## 2021-04-09 DIAGNOSIS — I480 Paroxysmal atrial fibrillation: Secondary | ICD-10-CM | POA: Diagnosis not present

## 2021-04-09 DIAGNOSIS — I5032 Chronic diastolic (congestive) heart failure: Secondary | ICD-10-CM | POA: Diagnosis not present

## 2021-04-09 DIAGNOSIS — K219 Gastro-esophageal reflux disease without esophagitis: Secondary | ICD-10-CM | POA: Diagnosis not present

## 2021-04-09 DIAGNOSIS — H4312 Vitreous hemorrhage, left eye: Secondary | ICD-10-CM | POA: Diagnosis not present

## 2021-04-09 DIAGNOSIS — E039 Hypothyroidism, unspecified: Secondary | ICD-10-CM | POA: Diagnosis not present

## 2021-04-09 DIAGNOSIS — E785 Hyperlipidemia, unspecified: Secondary | ICD-10-CM | POA: Diagnosis not present

## 2021-04-09 DIAGNOSIS — R69 Illness, unspecified: Secondary | ICD-10-CM | POA: Diagnosis not present

## 2021-04-09 DIAGNOSIS — D649 Anemia, unspecified: Secondary | ICD-10-CM | POA: Diagnosis not present

## 2021-04-09 DIAGNOSIS — E113512 Type 2 diabetes mellitus with proliferative diabetic retinopathy with macular edema, left eye: Secondary | ICD-10-CM | POA: Diagnosis not present

## 2021-04-10 ENCOUNTER — Encounter: Payer: Self-pay | Admitting: Dermatology

## 2021-04-12 DIAGNOSIS — I6523 Occlusion and stenosis of bilateral carotid arteries: Secondary | ICD-10-CM | POA: Insufficient documentation

## 2021-04-13 DIAGNOSIS — I5032 Chronic diastolic (congestive) heart failure: Secondary | ICD-10-CM | POA: Diagnosis not present

## 2021-04-13 DIAGNOSIS — H35372 Puckering of macula, left eye: Secondary | ICD-10-CM | POA: Diagnosis not present

## 2021-04-13 DIAGNOSIS — H4312 Vitreous hemorrhage, left eye: Secondary | ICD-10-CM | POA: Diagnosis not present

## 2021-04-13 DIAGNOSIS — D649 Anemia, unspecified: Secondary | ICD-10-CM | POA: Diagnosis not present

## 2021-04-13 DIAGNOSIS — E113512 Type 2 diabetes mellitus with proliferative diabetic retinopathy with macular edema, left eye: Secondary | ICD-10-CM | POA: Diagnosis not present

## 2021-04-13 DIAGNOSIS — R69 Illness, unspecified: Secondary | ICD-10-CM | POA: Diagnosis not present

## 2021-04-13 DIAGNOSIS — E039 Hypothyroidism, unspecified: Secondary | ICD-10-CM | POA: Diagnosis not present

## 2021-04-13 DIAGNOSIS — K219 Gastro-esophageal reflux disease without esophagitis: Secondary | ICD-10-CM | POA: Diagnosis not present

## 2021-04-13 DIAGNOSIS — E785 Hyperlipidemia, unspecified: Secondary | ICD-10-CM | POA: Diagnosis not present

## 2021-04-13 DIAGNOSIS — I480 Paroxysmal atrial fibrillation: Secondary | ICD-10-CM | POA: Diagnosis not present

## 2021-04-22 DIAGNOSIS — Z9889 Other specified postprocedural states: Secondary | ICD-10-CM | POA: Diagnosis not present

## 2021-04-22 DIAGNOSIS — Z48810 Encounter for surgical aftercare following surgery on the sense organs: Secondary | ICD-10-CM | POA: Diagnosis not present

## 2021-04-22 DIAGNOSIS — H43813 Vitreous degeneration, bilateral: Secondary | ICD-10-CM | POA: Diagnosis not present

## 2021-04-22 DIAGNOSIS — Z9842 Cataract extraction status, left eye: Secondary | ICD-10-CM | POA: Diagnosis not present

## 2021-04-22 DIAGNOSIS — Z961 Presence of intraocular lens: Secondary | ICD-10-CM | POA: Diagnosis not present

## 2021-04-22 DIAGNOSIS — Z9841 Cataract extraction status, right eye: Secondary | ICD-10-CM | POA: Diagnosis not present

## 2021-04-22 DIAGNOSIS — E113591 Type 2 diabetes mellitus with proliferative diabetic retinopathy without macular edema, right eye: Secondary | ICD-10-CM | POA: Diagnosis not present

## 2021-04-22 DIAGNOSIS — E113512 Type 2 diabetes mellitus with proliferative diabetic retinopathy with macular edema, left eye: Secondary | ICD-10-CM | POA: Diagnosis not present

## 2021-04-22 DIAGNOSIS — H35373 Puckering of macula, bilateral: Secondary | ICD-10-CM | POA: Diagnosis not present

## 2021-04-22 DIAGNOSIS — H4312 Vitreous hemorrhage, left eye: Secondary | ICD-10-CM | POA: Diagnosis not present

## 2021-05-07 ENCOUNTER — Telehealth: Payer: Self-pay | Admitting: Interventional Cardiology

## 2021-05-07 DIAGNOSIS — I5032 Chronic diastolic (congestive) heart failure: Secondary | ICD-10-CM

## 2021-05-07 MED ORDER — TORSEMIDE 20 MG PO TABS: 60.0000 mg | ORAL_TABLET | Freq: Every day | ORAL | 3 refills | Status: DC

## 2021-05-07 NOTE — Telephone Encounter (Signed)
Pt is calling stating he called 911 on yesterday in regards to symptoms he was having pt is requesting to speak with a nurse .

## 2021-05-07 NOTE — Telephone Encounter (Signed)
Pt has been fighting with swelling in BLE for months now.  Yesterday he noticed that he had swelling in his left arm and hand.  Wife called 911.  They came out and assessed pt. Vitals were normal per pt.  Pt did not want to go to ER so he said the paramedics had a long discussion with him about meds, diet and elevation.  Pt states swelling improves with elevation but comes back on when he sits up during the day.  Does not wear compression stockings because he cannot get them on.  Denies SOB.  Swelling in arm, hand and legs is better today.  Takes Torsemide '40mg'$  QD.  Pt states this is the worst his swelling has ever been.  Advised I will send information to Dr. Tamala Julian for review.

## 2021-05-07 NOTE — Telephone Encounter (Signed)
Patient is returning call.  °

## 2021-05-07 NOTE — Telephone Encounter (Signed)
Left message to call back  

## 2021-05-07 NOTE — Telephone Encounter (Signed)
Spoke with Dr. Tamala Julian and he reviewed chart.  He would like for pt to increase Torsemide to '60mg'$  QD and have BMET in 1 week.  Spoke with pt and reviewed recommendations.  Pt agreeable to plan.

## 2021-05-12 DIAGNOSIS — N1831 Chronic kidney disease, stage 3a: Secondary | ICD-10-CM | POA: Diagnosis not present

## 2021-05-12 DIAGNOSIS — I10 Essential (primary) hypertension: Secondary | ICD-10-CM | POA: Diagnosis not present

## 2021-05-12 DIAGNOSIS — I5032 Chronic diastolic (congestive) heart failure: Secondary | ICD-10-CM | POA: Diagnosis not present

## 2021-05-12 DIAGNOSIS — E785 Hyperlipidemia, unspecified: Secondary | ICD-10-CM | POA: Diagnosis not present

## 2021-05-14 ENCOUNTER — Other Ambulatory Visit: Payer: Medicare HMO | Admitting: *Deleted

## 2021-05-14 ENCOUNTER — Other Ambulatory Visit: Payer: Self-pay

## 2021-05-14 DIAGNOSIS — I5032 Chronic diastolic (congestive) heart failure: Secondary | ICD-10-CM | POA: Diagnosis not present

## 2021-05-15 LAB — BASIC METABOLIC PANEL
BUN/Creatinine Ratio: 15 (ref 10–24)
BUN: 17 mg/dL (ref 8–27)
CO2: 25 mmol/L (ref 20–29)
Calcium: 8.9 mg/dL (ref 8.6–10.2)
Chloride: 96 mmol/L (ref 96–106)
Creatinine, Ser: 1.17 mg/dL (ref 0.76–1.27)
Glucose: 293 mg/dL — ABNORMAL HIGH (ref 65–99)
Potassium: 4.8 mmol/L (ref 3.5–5.2)
Sodium: 136 mmol/L (ref 134–144)
eGFR: 64 mL/min/{1.73_m2} (ref >59)

## 2021-06-03 DIAGNOSIS — N1831 Chronic kidney disease, stage 3a: Secondary | ICD-10-CM | POA: Diagnosis not present

## 2021-06-03 DIAGNOSIS — E785 Hyperlipidemia, unspecified: Secondary | ICD-10-CM | POA: Diagnosis not present

## 2021-06-03 DIAGNOSIS — R269 Unspecified abnormalities of gait and mobility: Secondary | ICD-10-CM | POA: Diagnosis not present

## 2021-06-03 DIAGNOSIS — Z23 Encounter for immunization: Secondary | ICD-10-CM | POA: Diagnosis not present

## 2021-06-03 DIAGNOSIS — E559 Vitamin D deficiency, unspecified: Secondary | ICD-10-CM | POA: Diagnosis not present

## 2021-06-03 DIAGNOSIS — N401 Enlarged prostate with lower urinary tract symptoms: Secondary | ICD-10-CM | POA: Diagnosis not present

## 2021-06-03 DIAGNOSIS — I10 Essential (primary) hypertension: Secondary | ICD-10-CM | POA: Diagnosis not present

## 2021-06-03 DIAGNOSIS — I480 Paroxysmal atrial fibrillation: Secondary | ICD-10-CM | POA: Diagnosis not present

## 2021-06-03 DIAGNOSIS — E1140 Type 2 diabetes mellitus with diabetic neuropathy, unspecified: Secondary | ICD-10-CM | POA: Diagnosis not present

## 2021-06-03 DIAGNOSIS — E11319 Type 2 diabetes mellitus with unspecified diabetic retinopathy without macular edema: Secondary | ICD-10-CM | POA: Diagnosis not present

## 2021-06-03 DIAGNOSIS — I700 Atherosclerosis of aorta: Secondary | ICD-10-CM | POA: Diagnosis not present

## 2021-06-03 DIAGNOSIS — R946 Abnormal results of thyroid function studies: Secondary | ICD-10-CM | POA: Diagnosis not present

## 2021-06-03 DIAGNOSIS — D473 Essential (hemorrhagic) thrombocythemia: Secondary | ICD-10-CM | POA: Diagnosis not present

## 2021-06-03 DIAGNOSIS — I5032 Chronic diastolic (congestive) heart failure: Secondary | ICD-10-CM | POA: Diagnosis not present

## 2021-06-17 ENCOUNTER — Other Ambulatory Visit: Payer: Self-pay

## 2021-06-17 ENCOUNTER — Ambulatory Visit (INDEPENDENT_AMBULATORY_CARE_PROVIDER_SITE_OTHER): Payer: Medicare HMO | Admitting: Dermatology

## 2021-06-17 DIAGNOSIS — D0461 Carcinoma in situ of skin of right upper limb, including shoulder: Secondary | ICD-10-CM

## 2021-06-17 DIAGNOSIS — D0462 Carcinoma in situ of skin of left upper limb, including shoulder: Secondary | ICD-10-CM

## 2021-06-17 DIAGNOSIS — D099 Carcinoma in situ, unspecified: Secondary | ICD-10-CM

## 2021-06-17 NOTE — Patient Instructions (Signed)

## 2021-06-26 NOTE — Progress Notes (Signed)
[place]  [phone] [phone]     ID: [name] DOB: 03/20/1941  MR#: [id number]  BCW#:[id number]  Patient Care Team: [name], MD as PCP - General (Endocrinology) Belva Crome, MD as PCP - Cardiology (Cardiology) Thompson Grayer, MD as PCP - Electrophysiology (Cardiology) Dray Dente, Virgie Dad, MD as Consulting Physician (Oncology) Irene Shipper, MD as Consulting Physician (Gastroenterology) Lavonna Monarch, MD as Consulting Physician (Dermatology) Roel Cluck, MD as Referring Physician (Ophthalmology) Garvin Fila, MD as Consulting Physician (Neurology) Chauncey Cruel, MD OTHER MD:  CHIEF COMPLAINT: Essential thrombocytosis  CURRENT TREATMENT:  hydroxyurea   INTERVAL HISTORY: Zavon returns today for his yearly follow-up in the treatment of his essential thrombocytosis.   He continues on hydroxyurea.  His current dose is 1000 mg in the morning and 500 in the evening.  He tolerates this with no side effects that he is aware of.  It is not financially a burden to him.  This is keeping his platelets under control without significantly decreasing his white cells or hemoglobin.  Results for TRAVAS, SCHEXNAYDER (MRN [id number]) as of 06/28/2021 13:42  Ref. Range 07/31/2019 15:53 06/25/2020 11:30 10/26/2020 12:19 02/22/2021 12:31 06/28/2021 12:51  Platelets Latest Ref Range: 150 - 400 K/uL 436 348 394 348 366   REVIEW OF SYSTEMS: [name] fell not too long ago getting up from his recliner at night.  He landed mostly on his knees.  He had to crawl to the bathroom but eventually was able to get up.  He is very careful about falls, is particularly aware of the need to protect his head since he is on Eliquis and he does use a walking stick.  He had retinal surgery on the left at [facility] under Dr. Amedeo Plenty and that was very successful.  He has had a couple of squamous cells removed from his forearms by Dr. Denna Haggard.  He is not exercising  regularly.  A detailed review of systems today was otherwise stable.   COVID 19 VACCINATION STATUS: Pfizer x2   HISTORY OF CURRENT ILLNESS: From the original intake note:  Dr. Forde Dandy obtained lab work on [name] 09/25/2017 showing a platelet count of 1,035,000.  He referred him at that point for further evaluation and sent [place] his prior lab work.  The earliest platelet count I have is from 03/18/2013.  It was normal then at 263,000.  On 07/12/2015 the platelet count was 559,000.  On 08/10/2016 the platelet count was 725,000.  On 08/09/2017 the platelet count was 881,000  Prior to today's visit I se upt [name] for some additional lab work.  This was performed 09/29/2017.  On that date his white cell count was 10.4, with the absolute neutrophil count 8.0.  Hemoglobin was 14.0 with an MCV of 86.7.  The platelet count was 895,000  The labs obtained on that date included a ferritin of 103, iron saturation of 76%, C-reactive protein less than 0.8, LDH 209 and BCR/ABL not detected.  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  The patient's subsequent history is as detailed below.   PAST MEDICAL HISTORY: Past Medical History:  Diagnosis Date   Anxiety    Arthritis    Basal cell carcinoma 07/18/1991   Left nasal brdige (MOHS)   Basal cell carcinoma 01/23/1992   lower right back-(CX35FU)   Basal cell carcinoma 05/20/2003   sup-left back (CX35FU)  Basal cell carcinoma 07/28/2011   post lower neck   Basal cell carcinoma 08/20/2008   right sideburn(MOHS), sup-Left upper back (CX35FU), sup-mid back (CX35FU), nod-Right lower back )CX35FU), nod-right upperarm (CX35FU)   Basal cell carcinoma 06/08/2016   sup-Left upper back (CX35FU), mid back (CX35FU), right lower back (CX35FU), nod-Right upperarm (CX35FU)   Cancer (HCC)    skin - basil cell   Depression    Diabetes mellitus  without complication (HCC)    Dysrhythmia    a-fib   GERD (gastroesophageal reflux disease)    Hyperlipidemia    Hypertension    Neuropathy    Obesity    Paroxysmal atrial fibrillation (HCC)    Peripheral vascular disease (HCC)    diabetic neuropathy in both feet   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   in situ left forearm posterior tx after biopsy    SCCA (squamous cell carcinoma) of skin 01/20/2021   Right Forearm Posterior (in situ)   SCCA (squamous cell carcinoma) of skin 01/20/2021   Left Forearm Posterior (in situ)   Sleep apnea    uses C-pap machine   Squamous cell carcinoma of skin 02/11/2013   in situ-Right temple (CX35FU)   Squamous cell carcinoma of skin 08/20/2008   in situ- front scalp (CX35FU)   Squamous cell carcinoma of skin 06/08/2016   in situ-front scalp (CX35FU)   Squamous cell carcinoma of skin 02/05/2019   in situ-right sideburn-sup (CX35FU), in situ-right sideburn,inf (CX35FU)   Stroke ([place])    08/09/2017   Superficial basal cell carcinoma (BCC) 01/20/2021   Scalp    PAST SURGICAL HISTORY: Past Surgical History:  Procedure Laterality Date   APPENDECTOMY  1962   BACK SURGERY  00-02-12   x3   BASAL CELL CARCINOMA EXCISION  93/06/10   COLONOSCOPY     KNEE ARTHROSCOPY  005/01/02   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   Procedure: TOTAL LEFT KNEE ARTHROPLASTY;  Surgeon: [name], MD;  Location: [facility];  Service: Orthopedics;  Laterality: Left;   TOTAL KNEE ARTHROPLASTY Right 10/08/2018   Procedure: RIGHT TOTAL KNEE ARTHROPLASTY;  Surgeon: [name], MD;  Location: [facility];  Service: Orthopedics;  Laterality: Right;  55mn    FAMILY HISTORY Family History  Problem Relation Age of Onset   Cancer Mother    Diabetes Mellitus II Mother    Hypertension Mother    Heart failure Father    CVA Father    Hypertension Sister    Colon cancer Neg Hx   Patient's father died at age 9873shortly following a CABG.  The patient's mother  died at age 9861from cancer but the patient does not know what type.  The patient has 3 brothers, 1 sister.  The sister died at age 3073following a stroke.  One brother had heart disease but is now doing well.   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  He has 2 children from his first marriage, [name] who is a GMusic therapistand a med tDesigner, multimediain [place], and JAshland who works for the [employer] [place] [employer]  The patient has 4 biological grandchildren.  His second wife, [name] has 2 children of her own, SHilliard Clark 358 who is in [place]and works as a pHigher education careers adviserat [employer], who is a pManagement consultantin [place]  The patient is not a church attender    ADVANCED DIRECTIVES:  HEALTH MAINTENANCE: Social History   Tobacco Use   Smoking status: Former    Packs/day: 1.00    Years: 30.00    Pack years: 30.00    Types: Cigars, Cigarettes    Quit date: 08/02/1992    Years since quitting: 28.9   Smokeless tobacco: Never  Vaping Use   Vaping Use: Never used  Substance Use Topics   Alcohol use: Yes    Alcohol/week: 0.0 standard drinks    Comment: 1 beer a night.    Drug use: No     Colonoscopy: 05/12/2015  Bone density:  PSA:   Allergies  Allergen Reactions   Penicillin G Sodium Other (See Comments)    Current Outpatient Medications  Medication Sig Dispense Refill   ALPRAZolam (XANAX) 0.5 MG tablet Take 0.5 mg by mouth 2 (two) times daily.     atorvastatin (LIPITOR) 80 MG tablet Take 1 tablet (80 mg total) by mouth daily at 6 PM. 30 tablet 0   benazepril (LOTENSIN) 40 MG tablet Take 1 tablet (40 mg total) by mouth daily.     ELIQUIS 5 MG TABS tablet Take 1 tablet by mouth twice daily 60 tablet 5   ergocalciferol (VITAMIN D2) 50000 units capsule Take 50,000 Units by mouth once a week.      gabapentin (NEURONTIN) 600 MG tablet Take 600 mg by mouth 2 (two) times daily.     glucose  blood (ONETOUCH ULTRA) test strip USE 1 STRIP TO CHECK GLUCOSE 4 TIMES DAILY     hydroxyurea (HYDREA) 500 MG capsule TAKE 2 CAPSULES BY MOUTH IN THE MORNING AND 1 IN THE EVENING 270 capsule 4   insulin NPH-regular Human (NOVOLIN 70/30) (70-30) 100 UNIT/ML injection Inject 44 Units into the skin 2 (two) times daily with a meal.     levothyroxine (SYNTHROID) 75 MCG tablet TAKE 1 TABLET BY MOUTH IN THE MORNING ON AN EMPTY STOMACH 30 MINUTES BEFORE MEAL(S) AND OTHER MEDS     metFORMIN (GLUCOPHAGE) 1000 MG tablet Take 500 mg by mouth 2 (two) times daily.     metoprolol tartrate (LOPRESSOR) 50 MG tablet Take 50 mg by mouth 2 (two) times daily.     Multiple Vitamin (MULTIVITAMIN WITH MINERALS) TABS tablet Take 1 tablet by mouth daily.     omeprazole (PRILOSEC) 20 MG capsule Take 20 mg by mouth daily.     ONETOUCH VERIO test strip SMARTSIG:Via Meter     tamsulosin (FLOMAX) 0.4 MG CAPS capsule Take 0.4 mg by mouth daily.     torsemide (DEMADEX) 20 MG tablet Take 3 tablets (60 mg total) by mouth daily. 270 tablet 3   venlafaxine XR (EFFEXOR-XR) 75 MG 24 hr capsule Take 75 mg by mouth daily.     No current facility-administered medications for this visit.    OBJECTIVE: White man who appears older than stated age  60:   06/28/21 1313  BP: 140/76  Pulse: 88  Resp: 18  Temp: (!) 97.3 F (36.3 C)  SpO2: 97%    Wt Readings from Last 3 Encounters:  06/28/21 285 lb 14.4 oz (129.7 kg)  02/15/21 293 lb (132.9 kg)  01/06/21 293 lb (132.9 kg)   Body mass index is 39.32 kg/m.    ECOG FS:2 - Symptomatic, <50% confined to bed  Sclerae unicteric, EOMs intact Wearing a mask No cervical or supraclavicular adenopathy Lungs no rales or rhonchi Heart regular rate and rhythm Abd soft, nontender, positive bowel sounds MSK no focal spinal tenderness, no upper extremity  lymphedema Neuro: nonfocal, well oriented, appropriate affect    LAB RESULTS:  CMP     Component Value Date/Time   NA 136  05/14/2021 1449   K 4.8 05/14/2021 1449   CL 96 05/14/2021 1449   CO2 25 05/14/2021 1449   GLUCOSE 293 (H) 05/14/2021 1449   GLUCOSE 232 (H) 02/22/2021 1231   BUN 17 05/14/2021 1449   CREATININE 1.17 05/14/2021 1449   CREATININE 1.14 06/25/2020 1130   CALCIUM 8.9 05/14/2021 1449   PROT 6.1 (L) 02/22/2021 1231   PROT 6.2 07/04/2019 1351   ALBUMIN 3.3 (L) 02/22/2021 1231   ALBUMIN 3.7 07/04/2019 1351   AST 19 02/22/2021 1231   AST 21 06/25/2020 1130   ALT 19 02/22/2021 1231   ALT 24 06/25/2020 1130   ALKPHOS 97 02/22/2021 1231   BILITOT 0.6 02/22/2021 1231   BILITOT 0.7 06/25/2020 1130   GFRNONAA >60 02/22/2021 1231   GFRNONAA >60 06/25/2020 1130   GFRAA 89 08/12/2020 1614   GFRAA >60 09/26/2018 1148    Lab Results  Component Value Date   WBC 4.7 06/28/2021   NEUTROABS 3.5 06/28/2021   HGB 11.7 (L) 06/28/2021   HCT 32.8 (L) 06/28/2021   MCV 119.3 (H) 06/28/2021   PLT 366 06/28/2021   No results found for: LABCA2  No components found for: MBEMLJ449  No results for input(s): INR in the last 168 hours.  No results found for: LABCA2  No results found for: EEF007  No results found for: HQR975  No results found for: OIT254  No results found for: CA2729  No components found for: HGQUANT  No results found for: CEA1 / No results found for: CEA1   No results found for: AFPTUMOR  No results found for: CHROMOGRNA  No results found for: TOTALPROTELP, ALBUMINELP, A1GS, A2GS, BETS, BETA2SER, GAMS, MSPIKE, SPEI (this displays SPEP labs)  No results found for: KPAFRELGTCHN, LAMBDASER, KAPLAMBRATIO (kappa/lambda light chains)  No results found for: HGBA, HGBA2QUANT, HGBFQUANT, HGBSQUAN (Hemoglobinopathy evaluation)   Lab Results  Component Value Date   LDH 209 09/29/2017    Lab Results  Component Value Date   IRON 166 (H) 09/29/2017   TIBC 219 09/29/2017   IRONPCTSAT 76 09/29/2017   (Iron and TIBC)  Lab Results  Component Value Date   FERRITIN 143  09/29/2017    Urinalysis    Component Value Date/Time   COLORURINE YELLOW 10/13/2018 2034   APPEARANCEUR CLEAR 10/13/2018 2034   LABSPEC 1.012 10/13/2018 2034   PHURINE 7.0 10/13/2018 2034   GLUCOSEU >=500 (A) 10/13/2018 2034   [place] 10/13/2018 2034   [place] 10/13/2018 2034   [place] 10/13/2018 2034   PROTEINUR NEGATIVE 10/13/2018 2034   NITRITE NEGATIVE 10/13/2018 2034   LEUKOCYTESUR NEGATIVE 10/13/2018 2034    STUDIES: No results found.     ELIGIBLE FOR AVAILABLE RESEARCH PROTOCOL: no  ASSESSMENT: 80 y.o. [place] Man with a platelet count in excess of 1 million 09/25/2017, a positive Jak2 V617F mutation, and normal iron studies, normal C-reactive protein, and negative BCR-ABL by PCR  (1) essential thrombocytosis: Started on Hydrea 1000 mg a day 10/06/2017  (a) Hydrea dose increased to 1500 mg daily beginning 06/27/2019  (2) bone marrow biopsy discussed, postponed  (3) 0.7 cm right upper lung nodule incidentally noted on neck CT scans November 2018  (a) no change on repeat CT of the chest with contrast 02/28/2018  (b) chest x-ray 10/13/2018 obtained for evaluation of a cough shows no mention of  a mass  PLAN: Gurkaran is now almost 4 years out from his initial diagnosis of essential thrombocytosis.  His platelet count is well controlled on his current dose of Hydrea, 2 tablets in the morning and 1 in the evening, and he is obtaining these at an affordable cost.  He has had no bleeding or clotting problems and his hemoglobin and white cell counts have not had been significantly depressed  Accordingly we are making no changes in his treatment.  We will check counts again in 6 months and 12 months and see him again with the 81-monthvisit.  He knows to call for any other issue that may develop before then  Total encounter time 20 minutes.*   Valerian Jewel, GVirgie Dad MD  06/28/21 1:16 PM Medical Oncology and Hematology [facility] [place] [address]Tel. [phone]   Fax. [phone]  I, KWilburn Mylar am acting as scribe for Dr. GVirgie Dad Abdulmalik Darco.  I, GLurline DelMD, have reviewed the above documentation for accuracy and completeness, and I agree with the above.   *Total Encounter Time as defined by the Centers for Medicare and Medicaid Services includes, in addition to the face-to-face time of a patient visit (documented in the note above) non-face-to-face time: obtaining and reviewing outside history, ordering and reviewing medications, tests or procedures, care coordination (communications with other health care professionals or caregivers) and documentation in the medical record.

## 2021-06-28 ENCOUNTER — Inpatient Hospital Stay: Payer: Medicare HMO | Attending: Oncology | Admitting: Oncology

## 2021-06-28 ENCOUNTER — Inpatient Hospital Stay: Payer: Medicare HMO

## 2021-06-28 ENCOUNTER — Other Ambulatory Visit: Payer: Self-pay

## 2021-06-28 VITALS — BP 140/76 | HR 88 | Temp 97.3°F | Resp 18 | Wt 285.9 lb

## 2021-06-28 DIAGNOSIS — D473 Essential (hemorrhagic) thrombocythemia: Secondary | ICD-10-CM | POA: Diagnosis not present

## 2021-06-28 LAB — CBC WITH DIFFERENTIAL/PLATELET
Abs Immature Granulocytes: 0.03 10*3/uL (ref 0.00–0.07)
Basophils Absolute: 0 10*3/uL (ref 0.0–0.1)
Basophils Relative: 1 %
Eosinophils Absolute: 0.1 10*3/uL (ref 0.0–0.5)
Eosinophils Relative: 2 %
HCT: 32.8 % — ABNORMAL LOW (ref 39.0–52.0)
Hemoglobin: 11.7 g/dL — ABNORMAL LOW (ref 13.0–17.0)
Immature Granulocytes: 1 %
Lymphocytes Relative: 16 %
Lymphs Abs: 0.7 10*3/uL (ref 0.7–4.0)
MCH: 42.5 pg — ABNORMAL HIGH (ref 26.0–34.0)
MCHC: 35.7 g/dL (ref 30.0–36.0)
MCV: 119.3 fL — ABNORMAL HIGH (ref 80.0–100.0)
Monocytes Absolute: 0.3 10*3/uL (ref 0.1–1.0)
Monocytes Relative: 6 %
Neutro Abs: 3.5 10*3/uL (ref 1.7–7.7)
Neutrophils Relative %: 74 %
Platelets: 366 10*3/uL (ref 150–400)
RBC: 2.75 MIL/uL — ABNORMAL LOW (ref 4.22–5.81)
RDW: 14.2 % (ref 11.5–15.5)
WBC: 4.7 10*3/uL (ref 4.0–10.5)
nRBC: 0.4 % — ABNORMAL HIGH (ref 0.0–0.2)

## 2021-06-28 MED ORDER — HYDROXYUREA 500 MG PO CAPS
ORAL_CAPSULE | ORAL | 4 refills | Status: DC
Start: 2021-06-28 — End: 2022-08-15

## 2021-07-04 ENCOUNTER — Encounter: Payer: Self-pay | Admitting: Dermatology

## 2021-07-04 NOTE — Progress Notes (Signed)
   Follow-Up Visit   Subjective  [name] is a 80 y.o. male who presents for the following: Procedure (Here for treatment right forearm CIS, left forearm posterior CIS, and scalp BCC. ).  We will treat the 2 biopsy-proven nonmelanoma skin cancers on the arms today Location:  Duration:  Quality:  Associated Signs/Symptoms: Modifying Factors:  Severity:  Timing: Context:   Objective  Well appearing patient in no apparent distress; mood and affect are within normal limits. Left Forearm - Posterior Lesion identified by Dr.Cayman Kielbasa and nurse in room.    Right Forearm - Posterior Lesion identified by Dr.Beulah Capobianco and nurse in room.      A focused examination was performed including head, neck, arms. Relevant physical exam findings are noted in the Assessment and Plan.   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  Destruction of lesion Complexity: simple   Destruction method: electrodesiccation and curettage   Informed consent: discussed and consent obtained   Timeout:  patient name, date of birth, surgical site, and procedure verified Anesthesia: the lesion was anesthetized in a standard fashion   Anesthetic:  1% lidocaine w/ epinephrine 1-100,000 local infiltration Curettage performed in three different directions: Yes   Curettage cycles:  3 Lesion length (cm):  2 Lesion width (cm):  2 Margin per side (cm):  0 Final wound size (cm):  2 Hemostasis achieved with:  ferric subsulfate Outcome: patient tolerated procedure well with no complications   Additional details:  Wound innoculated with 5 fluorouracil solution.  Right Forearm - Posterior  Destruction of lesion Complexity: simple   Destruction method: electrodesiccation and curettage   Informed consent: discussed and consent obtained   Timeout:  patient name, date of birth, surgical site, and procedure verified Anesthesia: the lesion was anesthetized in a standard fashion    Anesthetic:  1% lidocaine w/ epinephrine 1-100,000 local infiltration Curettage performed in three different directions: Yes   Curettage cycles:  3 Lesion length (cm):  2 Lesion width (cm):  2 Margin per side (cm):  0 Final wound size (cm):  2 Hemostasis achieved with:  ferric subsulfate Outcome: patient tolerated procedure well with no complications   Additional details:  Wound innoculated with 5 fluorouracil solution.      I, Lavonna Monarch, MD, have reviewed all documentation for this visit.  The documentation on 07/04/21 for the exam, diagnosis, procedures, and orders are all accurate and complete.

## 2021-07-28 DIAGNOSIS — H5203 Hypermetropia, bilateral: Secondary | ICD-10-CM | POA: Diagnosis not present

## 2021-07-28 DIAGNOSIS — H524 Presbyopia: Secondary | ICD-10-CM | POA: Diagnosis not present

## 2021-07-28 DIAGNOSIS — H18423 Band keratopathy, bilateral: Secondary | ICD-10-CM | POA: Diagnosis not present

## 2021-07-28 DIAGNOSIS — H538 Other visual disturbances: Secondary | ICD-10-CM | POA: Diagnosis not present

## 2021-07-28 DIAGNOSIS — H52203 Unspecified astigmatism, bilateral: Secondary | ICD-10-CM | POA: Diagnosis not present

## 2021-08-23 ENCOUNTER — Other Ambulatory Visit: Payer: Self-pay | Admitting: Internal Medicine

## 2021-08-23 DIAGNOSIS — I480 Paroxysmal atrial fibrillation: Secondary | ICD-10-CM

## 2021-08-23 NOTE — Telephone Encounter (Signed)
Eliquis 5mg  refill request received. Patient is 80 years old, weight-129.7kg, Crea-1.17 on 05/14/2021, Diagnosis-Afib, and last seen by Oda Kilts on 02/15/2021. Dose is appropriate based on dosing criteria. Will send in refill to requested pharmacy.

## 2021-08-31 DIAGNOSIS — M25552 Pain in left hip: Secondary | ICD-10-CM | POA: Diagnosis not present

## 2021-09-23 DIAGNOSIS — M25552 Pain in left hip: Secondary | ICD-10-CM | POA: Diagnosis not present

## 2021-09-27 DIAGNOSIS — M25552 Pain in left hip: Secondary | ICD-10-CM | POA: Diagnosis not present

## 2021-09-30 ENCOUNTER — Encounter: Payer: Medicare HMO | Admitting: Dermatology

## 2021-09-30 DIAGNOSIS — M25552 Pain in left hip: Secondary | ICD-10-CM | POA: Diagnosis not present

## 2021-10-06 DIAGNOSIS — H4312 Vitreous hemorrhage, left eye: Secondary | ICD-10-CM | POA: Diagnosis not present

## 2021-10-06 DIAGNOSIS — Z961 Presence of intraocular lens: Secondary | ICD-10-CM | POA: Diagnosis not present

## 2021-10-06 DIAGNOSIS — H35373 Puckering of macula, bilateral: Secondary | ICD-10-CM | POA: Diagnosis not present

## 2021-10-06 DIAGNOSIS — E113512 Type 2 diabetes mellitus with proliferative diabetic retinopathy with macular edema, left eye: Secondary | ICD-10-CM | POA: Diagnosis not present

## 2021-10-06 DIAGNOSIS — Z794 Long term (current) use of insulin: Secondary | ICD-10-CM | POA: Diagnosis not present

## 2021-10-06 DIAGNOSIS — E113591 Type 2 diabetes mellitus with proliferative diabetic retinopathy without macular edema, right eye: Secondary | ICD-10-CM | POA: Diagnosis not present

## 2021-10-07 DIAGNOSIS — M25552 Pain in left hip: Secondary | ICD-10-CM | POA: Diagnosis not present

## 2021-10-11 DIAGNOSIS — M25552 Pain in left hip: Secondary | ICD-10-CM | POA: Diagnosis not present

## 2021-10-14 DIAGNOSIS — M25552 Pain in left hip: Secondary | ICD-10-CM | POA: Diagnosis not present

## 2021-10-18 DIAGNOSIS — M25552 Pain in left hip: Secondary | ICD-10-CM | POA: Diagnosis not present

## 2021-10-21 ENCOUNTER — Ambulatory Visit (INDEPENDENT_AMBULATORY_CARE_PROVIDER_SITE_OTHER): Payer: Medicare HMO | Admitting: Dermatology

## 2021-10-21 ENCOUNTER — Other Ambulatory Visit: Payer: Self-pay

## 2021-10-21 ENCOUNTER — Encounter: Payer: Self-pay | Admitting: Dermatology

## 2021-10-21 DIAGNOSIS — L820 Inflamed seborrheic keratosis: Secondary | ICD-10-CM | POA: Diagnosis not present

## 2021-10-21 DIAGNOSIS — L080 Pyoderma: Secondary | ICD-10-CM

## 2021-10-21 DIAGNOSIS — L570 Actinic keratosis: Secondary | ICD-10-CM

## 2021-10-21 DIAGNOSIS — C4441 Basal cell carcinoma of skin of scalp and neck: Secondary | ICD-10-CM | POA: Diagnosis not present

## 2021-10-21 MED ORDER — MUPIROCIN 2 % EX OINT: 1.0000 "application " | TOPICAL_OINTMENT | Freq: Two times a day (BID) | CUTANEOUS | 0 refills | Status: DC

## 2021-10-21 NOTE — Patient Instructions (Signed)

## 2021-11-03 DIAGNOSIS — M1612 Unilateral primary osteoarthritis, left hip: Secondary | ICD-10-CM | POA: Diagnosis not present

## 2021-11-10 ENCOUNTER — Encounter: Payer: Self-pay | Admitting: Dermatology

## 2021-11-10 NOTE — Progress Notes (Signed)
Follow-Up Visit   Subjective  [name] is a 81 y.o. male who presents for the following: Procedure (Here for tx- scalp - bcc x 1).   Basal cell carcinoma of skin of scalp and neck Scalp  Destruction of lesion Complexity: simple   Destruction method: electrodesiccation and curettage   Informed consent: discussed and consent obtained   Timeout:  patient name, date of birth, surgical site, and procedure verified Anesthesia: the lesion was anesthetized in a standard fashion   Anesthetic:  1% lidocaine w/ epinephrine 1-100,000 local infiltration Curettage performed in three different directions: Yes   Curettage cycles:  3 Lesion length (cm):  2.5 Lesion width (cm):  2.5 Margin per side (cm):  0 Final wound size (cm):  2.5 Hemostasis achieved with:  ferric subsulfate Outcome: patient tolerated procedure well with no complications   Post-procedure details: sterile dressing applied and wound care instructions given   Dressing type: bandage and petrolatum   Additional details:  Wound inoculated with 5% Fluorouracil.    Pyoderma Right Elbow - Posterior  He will apply mupirocin to the wound for 1 to 2 weeks, contact me if this does not clear  Related Medications mupirocin ointment (BACTROBAN) 2 % Apply 1 application topically 2 (two) times daily.  Actinic keratosis Right Temple  Destruction of lesion - Right Temple Complexity: simple   Destruction method: cryotherapy   Informed consent: discussed and consent obtained   Timeout:  patient name, date of birth, surgical site, and procedure verified Lesion destroyed using liquid nitrogen: Yes   Cryotherapy cycles:  3 Outcome: patient tolerated procedure well with no complications   Post-procedure details: wound care instructions given    Inflamed seborrheic keratosis (2) Right Temple  Destruction of lesion - Right Temple Complexity: simple   Destruction method: cryotherapy   Informed consent: discussed and consent  obtained   Timeout:  patient name, date of birth, surgical site, and procedure verified Lesion destroyed using liquid nitrogen: Yes   Cryotherapy cycles:  3 Outcome: patient tolerated procedure well with no complications   Post-procedure details: wound care instructions given        I, Lavonna Monarch, MD, have reviewed all documentation for this visit.  The documentation on 11/10/21 for the exam, diagnosis, procedures, and orders are all accurate and complete. Location:  Duration:  Quality:  Associated Signs/Symptoms: Modifying Factors:  Severity:  Timing: Context:   Objective  Well appearing patient in no apparent distress; mood and affect are within normal limits. Scalp Lesion identified by Dr.Anabela Crayton and nurse in room.    Right Elbow - Posterior Impetiginized.  8 Millimeter crust, perhaps after minor trauma.  Right Temple Hornlike 6 mm pink crust  Right Temple (2) 4 mm pink flattopped crust, dermoscopy compatible with I-S K    A focused examination was performed including head, neck, arms. Relevant physical exam findings are noted in the Assessment and Plan.   Assessment & Plan    Basal cell carcinoma of skin of scalp and neck Scalp  Destruction of lesion Complexity: simple   Destruction method: electrodesiccation and curettage   Informed consent: discussed and consent obtained   Timeout:  patient name, date of birth, surgical site, and procedure verified Anesthesia: the lesion was anesthetized in a standard fashion   Anesthetic:  1% lidocaine w/ epinephrine 1-100,000 local infiltration Curettage performed in three different directions: Yes   Curettage cycles:  3 Lesion length (cm):  2.5 Lesion width (cm):  2.5 Margin per side (cm):  0 Final wound size (cm):  2.5 Hemostasis achieved with:  ferric subsulfate Outcome: patient tolerated procedure well with no complications   Post-procedure details: sterile dressing applied and wound care instructions given    Dressing type: bandage and petrolatum   Additional details:  Wound inoculated with 5% Fluorouracil.    Pyoderma Right Elbow - Posterior  He will apply mupirocin to the wound for 1 to 2 weeks, contact me if this does not clear  Related Medications mupirocin ointment (BACTROBAN) 2 % Apply 1 application topically 2 (two) times daily.  Actinic keratosis Right Temple  Destruction of lesion - Right Temple Complexity: simple   Destruction method: cryotherapy   Informed consent: discussed and consent obtained   Timeout:  patient name, date of birth, surgical site, and procedure verified Lesion destroyed using liquid nitrogen: Yes   Cryotherapy cycles:  3 Outcome: patient tolerated procedure well with no complications   Post-procedure details: wound care instructions given    Inflamed seborrheic keratosis (2) Right Temple  Destruction of lesion - Right Temple Complexity: simple   Destruction method: cryotherapy   Informed consent: discussed and consent obtained   Timeout:  patient name, date of birth, surgical site, and procedure verified Lesion destroyed using liquid nitrogen: Yes   Cryotherapy cycles:  3 Outcome: patient tolerated procedure well with no complications   Post-procedure details: wound care instructions given        I, Lavonna Monarch, MD, have reviewed all documentation for this visit.  The documentation on 11/10/21 for the exam, diagnosis, procedures, and orders are all accurate and complete.

## 2021-11-11 ENCOUNTER — Telehealth: Payer: Self-pay | Admitting: Interventional Cardiology

## 2021-11-11 NOTE — Telephone Encounter (Signed)
Pt c/o swelling: STAT is pt has developed SOB within 24 hours ? ?How much weight have you gained and in what time span?  ?No weight gain  ? ?If swelling, where is the swelling located?  ?Ankles and legs, hands occasionally  ? ?Are you currently taking a fluid pill?  ?Patient takes torsemide (DEMADEX) 20 MG tablet, 3 tablets once in the AM by mouth ? ?Are you currently SOB?  ?No  ? ?Do you have a log of your daily weights (if so, list)?  ?No ? ?Have you gained 3 pounds in a day or 5 pounds in a week?  ?No weight gain ? ?Have you traveled recently?  ? ?No traveling  ?

## 2021-11-11 NOTE — Telephone Encounter (Signed)
Spoke with the patient who reports that he continues to have swelling in legs and ankles. He occasionally has swelling in his hands as well. He states that this has been ongoing. It has not gotten any worse but he is concerned because it has not improved. He denies any shortness of breath. He states that he does not take daily weight but he can tell by how his clothes fit that he has not gained any weight. Educated patient on his diet, he reports he does snack on popcorn, has occasional bacon, and only eats out once every 2 weeks. Otherwise avoids high sodium foods and does not add salt to his diet. He continues to take torsemide 60 mg daily. He does elevate his legs at night while sleeping in his recliner -uses a pillow under his feet. Advised to try and elevate his legs during the day as well when he can. Advised that I would let Dr. Tamala Julian know and call him back if he has any further recommendations. He states that if we need to call him back to please call him in the afternoon.  ?

## 2021-11-12 NOTE — Telephone Encounter (Signed)
Left message to call back  

## 2021-11-12 NOTE — Telephone Encounter (Signed)
Spoke with pt and scheduled him the first he was available to come in which was Tuesday at 3:30pm with Dr. Harriet Masson.  Pt aware to go to [facility] office and not [employer].  Pt appreciative for call.  ?

## 2021-11-16 ENCOUNTER — Telehealth: Payer: Self-pay

## 2021-11-16 ENCOUNTER — Other Ambulatory Visit: Payer: Self-pay

## 2021-11-16 ENCOUNTER — Ambulatory Visit: Payer: Medicare HMO | Admitting: Cardiology

## 2021-11-16 ENCOUNTER — Encounter: Payer: Self-pay | Admitting: Cardiology

## 2021-11-16 VITALS — BP 116/60 | HR 66 | Ht 71.5 in | Wt 290.4 lb

## 2021-11-16 DIAGNOSIS — Z79899 Other long term (current) drug therapy: Secondary | ICD-10-CM | POA: Diagnosis not present

## 2021-11-16 DIAGNOSIS — I4892 Unspecified atrial flutter: Secondary | ICD-10-CM | POA: Diagnosis not present

## 2021-11-16 DIAGNOSIS — I5043 Acute on chronic combined systolic (congestive) and diastolic (congestive) heart failure: Secondary | ICD-10-CM | POA: Diagnosis not present

## 2021-11-16 MED ORDER — METOLAZONE 5 MG PO TABS: 5.0000 mg | ORAL_TABLET | Freq: Every day | ORAL | 0 refills | Status: DC

## 2021-11-16 NOTE — Patient Instructions (Addendum)
Medication Instructions:  ?Your physician has recommended you make the following change in your medication:  ?START: Metolazone for 5 days. Take 30 minutes before Torsemide.  ? ?I would like you to go to the Emergency Department but you have declined.  ? ?*If you need a refill on your cardiac medications before your next appointment, please call your pharmacy* ? ? ?Lab Work: ?Your physician recommends that you return for lab work in:  ?TODAY: BMET, Mag, LFTs, BNP ?If you have labs (blood work) drawn today and your tests are completely normal, you will receive your results only by: ?MyChart Message (if you have MyChart) OR ?A paper copy in the mail ?If you have any lab test that is abnormal or we need to change your treatment, we will call you to review the results. ? ? ?Testing/Procedures: ?None ? ? ?Follow-Up: ?At [facility], you and your health needs are our priority.  As part of our continuing mission to provide you with exceptional heart care, we have created designated Provider Care Teams.  These Care Teams include your primary Cardiologist (physician) and Advanced Practice Providers (APPs -  Physician Assistants and Nurse Practitioners) who all work together to provide you with the care you need, when you need it. ? ?We recommend signing up for the patient portal called "MyChart".  Sign up information is provided on this After Visit Summary.  MyChart is used to connect with patients for Virtual Visits (Telemedicine).  Patients are able to view lab/test results, encounter notes, upcoming appointments, etc.  Non-urgent messages can be sent to your provider as well.   ?To learn more about what you can do with MyChart, go to [email or web address].   ? ?The format for your next appointment:   ?In Person ? ?Provider:   ?[name], MD  Or App at [address] in 1 week.  ? ?  ?

## 2021-11-16 NOTE — Telephone Encounter (Signed)
Called pt to let him know we could see him sooner. No answer at this time, left a message.  ?

## 2021-11-16 NOTE — Progress Notes (Signed)
Cardiology Office Note:    Date:  11/20/2021   ID:  [name], DOB 12-04-1940, MRN [id number]  PCP:  [name], MD  Cardiologist:  [name], MD  Electrophysiologist:  Thompson Grayer, MD   Referring MD: [name], MD   " I am really short of breath"   History of Present Illness:    [name] is a 81 y.o. male with a hx of  paroxysmal atrial fibrillation, chronic diastolic CHF, CVA, OSA, HTN, DM2, PAD, PVD and HLD t his primary cardiologist is Dr. Tamala Julian as well as had seen Dr. Rayann Heman in the past.   His last visit with Dr. Tamala Julian was in March 2021 however he seen multiple other providers in the practice and was seen by Oda Kilts, [place] on March 03, 2021.  During that visit per review of the note he was in sinus rhythm, he clinically was doing well and we discussed his chronic conditions with no plan for any acute interventions.  He was advised to follow-up in 6 months.  Recently the patient tells me that he has been experiencing significant leg swelling as well as shortness of breath therefore he requested to be seen and he was placed on my schedule acutely given his worsening symptoms.  He tells me he is having shortness of breath on exertion and leg swelling but denies any PND orthopnea.   Past Medical History:  Diagnosis Date   Anxiety    Arthritis    Basal cell carcinoma 07/18/1991   Left nasal brdige (MOHS)   Basal cell carcinoma 01/23/1992   lower right back-(CX35FU)   Basal cell carcinoma 05/20/2003   sup-left back (CX35FU)   Basal cell carcinoma 07/28/2011   post lower neck   Basal cell carcinoma 08/20/2008   right sideburn(MOHS), sup-Left upper back (CX35FU), sup-mid back (CX35FU), nod-Right lower back )CX35FU), nod-right upperarm (CX35FU)   Basal cell carcinoma 06/08/2016   sup-Left upper back (CX35FU), mid back (CX35FU), right lower back (CX35FU), nod-Right upperarm (CX35FU)   Cancer (HCC)    skin - basil cell   Depression    Diabetes  mellitus without complication (HCC)    Dysrhythmia    a-fib   GERD (gastroesophageal reflux disease)    Hyperlipidemia    Hypertension    Neuropathy    Obesity    Paroxysmal atrial fibrillation (HCC)    Peripheral vascular disease ([place])    diabetic neuropathy in both feet   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   in situ left forearm posterior tx after biopsy    SCCA (squamous cell carcinoma) of skin 01/20/2021   Right Forearm Posterior (in situ)   SCCA (squamous cell carcinoma) of skin 01/20/2021   Left Forearm Posterior (in situ)   Sleep apnea    uses C-pap machine   Squamous cell carcinoma of skin 02/11/2013   in situ-Right temple (CX35FU)   Squamous cell carcinoma of skin 08/20/2008   in situ- front scalp (CX35FU)   Squamous cell carcinoma of skin 06/08/2016   in situ-front scalp (CX35FU)   Squamous cell carcinoma of skin 02/05/2019   in situ-right sideburn-sup (CX35FU), in situ-right sideburn,inf (CX35FU)   Stroke ([place])    08/09/2017   Superficial basal cell carcinoma (BCC) 01/20/2021   Scalp    Past Surgical History:  Procedure Laterality Date   APPENDECTOMY  1962   BACK SURGERY  00-02-12   x3   BASAL CELL CARCINOMA EXCISION  93/06/10   COLONOSCOPY  KNEE ARTHROSCOPY  005/01/02   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   Procedure: TOTAL LEFT KNEE ARTHROPLASTY;  Surgeon: [name], MD;  Location: [facility];  Service: Orthopedics;  Laterality: Left;   TOTAL KNEE ARTHROPLASTY Right 10/08/2018   Procedure: RIGHT TOTAL KNEE ARTHROPLASTY;  Surgeon: [name], MD;  Location: [facility];  Service: Orthopedics;  Laterality: Right;  31mn    Current Medications: Current Meds  Medication Sig   ALPRAZolam (XANAX) 0.5 MG tablet Take 0.5 mg by mouth 2 (two) times daily.   apixaban (ELIQUIS) 5 MG TABS tablet Take 1 tablet by mouth twice daily   atorvastatin (LIPITOR) 80 MG tablet Take 1 tablet (80 mg total) by mouth daily at 6 PM.   benazepril (LOTENSIN) 40  MG tablet Take 1 tablet (40 mg total) by mouth daily.   ergocalciferol (VITAMIN D2) 50000 units capsule Take 50,000 Units by mouth once a week.    gabapentin (NEURONTIN) 600 MG tablet Take 600 mg by mouth 2 (two) times daily.   glucose blood (ONETOUCH ULTRA) test strip USE 1 STRIP TO CHECK GLUCOSE 4 TIMES DAILY   hydroxyurea (HYDREA) 500 MG capsule TAKE 2 CAPSULES BY MOUTH IN THE MORNING AND 1 IN THE EVENING   insulin NPH-regular Human (NOVOLIN 70/30) (70-30) 100 UNIT/ML injection Inject 44 Units into the skin 2 (two) times daily with a meal.   levothyroxine (SYNTHROID) 75 MCG tablet TAKE 1 TABLET BY MOUTH IN THE MORNING ON AN EMPTY STOMACH 30 MINUTES BEFORE MEAL(S) AND OTHER MEDS   metFORMIN (GLUCOPHAGE) 1000 MG tablet Take 500 mg by mouth 2 (two) times daily.   metolazone (ZAROXOLYN) 5 MG tablet Take 1 tablet (5 mg total) by mouth daily. Take 30 minutes before Torsemide.   metoprolol tartrate (LOPRESSOR) 50 MG tablet Take 50 mg by mouth 2 (two) times daily.   Multiple Vitamin (MULTIVITAMIN WITH MINERALS) TABS tablet Take 1 tablet by mouth daily.   mupirocin ointment (BACTROBAN) 2 % Apply 1 application topically 2 (two) times daily.   omeprazole (PRILOSEC) 20 MG capsule Take 20 mg by mouth daily.   ONETOUCH VERIO test strip SMARTSIG:Via Meter   tamsulosin (FLOMAX) 0.4 MG CAPS capsule Take 0.4 mg by mouth daily.   torsemide (DEMADEX) 20 MG tablet Take 3 tablets (60 mg total) by mouth daily.   venlafaxine XR (EFFEXOR-XR) 75 MG 24 hr capsule Take 75 mg by mouth daily.     Allergies:   Penicillin g sodium   Social History   Socioeconomic History   Marital status: Married    Spouse name: Not on file   Number of children: Not on file   Years of education: Not on file   Highest education level: Not on file  Occupational History   Not on file  Tobacco Use   Smoking status: Former    Packs/day: 1.00    Years: 30.00    Pack years: 30.00    Types: Cigars, Cigarettes    Quit date:  08/02/1992    Years since quitting: 29.3   Smokeless tobacco: Never  Vaping Use   Vaping Use: Never used  Substance and Sexual Activity   Alcohol use: Yes    Alcohol/week: 0.0 standard drinks    Comment: 1 beer a night.    Drug use: No   Sexual activity: Not on file  Other Topics Concern   Not on file  Social History Narrative   Not on file   Social Determinants of Health   Financial Resource Strain:  Not on file  Food Insecurity: Not on file  Transportation Needs: Not on file  Physical Activity: Not on file  Stress: Not on file  Social Connections: Not on file     Family History: The patient's family history includes CVA in his father; Cancer in his mother; Diabetes Mellitus II in his mother; Heart failure in his father; Hypertension in his mother and sister. There is no history of Colon cancer.  ROS:   Review of Systems  Constitution: Negative for decreased appetite, fever and weight gain.  HENT: Negative for congestion, ear discharge, hoarse voice and sore throat.   Eyes: Negative for discharge, redness, vision loss in right eye and visual halos.  Cardiovascular: Reports dyspnea on exertion and leg swelling.  Negative for chest pain,, orthopnea and palpitations.  Respiratory: Negative for cough, hemoptysis, shortness of breath and snoring.   Endocrine: Negative for heat intolerance and polyphagia.  Hematologic/Lymphatic: Negative for bleeding problem. Does not bruise/bleed easily.  Skin: Negative for flushing, nail changes, rash and suspicious lesions.  Musculoskeletal: Negative for arthritis, joint pain, muscle cramps, myalgias, neck pain and stiffness.  Gastrointestinal: Negative for abdominal pain, bowel incontinence, diarrhea and excessive appetite.  Genitourinary: Negative for decreased libido, genital sores and incomplete emptying.  Neurological: Negative for brief paralysis, focal weakness, headaches and loss of balance.  Psychiatric/Behavioral: Negative for  altered mental status, depression and suicidal ideas.  Allergic/Immunologic: Negative for HIV exposure and persistent infections.    EKGs/Labs/Other Studies Reviewed:    The following studies were reviewed today:   EKG:  The ekg ordered today demonstrates atrial flutter with 4: 1 conduction, heart rate 66 bpm   TTE 12/30/2020 IMPRESSIONS     1. Left ventricular ejection fraction, by estimation, is 60 to 65%. The  left ventricle has normal function. The left ventricle has no regional  wall motion abnormalities. There is mild concentric left ventricular  hypertrophy. Left ventricular diastolic  parameters are indeterminate.   2. Right ventricular systolic function is normal. The right ventricular  size is mildly enlarged.   3. The mitral valve is normal in structure. No evidence of mitral valve  regurgitation.   4. The aortic valve is tricuspid. There is mild calcification of the  aortic valve. Aortic valve regurgitation is not visualized. No aortic  stenosis is present.   5. Aortic dilatation noted. There is borderline dilatation of the aortic  root, measuring 38 mm. There is borderline dilatation of the ascending  aorta, measuring 39 mm.   Comparison(s): A prior study was performed on 06/02/2020. Prior images  reviewed side by side. Ascending aorta appears slightly larger.   FINDINGS   Left Ventricle: Left ventricular ejection fraction, by estimation, is 60  to 65%. The left ventricle has normal function. The left ventricle has no  regional wall motion abnormalities. The left ventricular internal cavity  size was normal in size. There is   mild concentric left ventricular hypertrophy. Left ventricular diastolic  parameters are indeterminate.   Right Ventricle: The right ventricular size is mildly enlarged. Right  vetricular wall thickness was not well visualized. Right ventricular  systolic function is normal.   Left Atrium: Left atrial size was normal in size.   Right  Atrium: Right atrial size was normal in size.   Pericardium: There is no evidence of pericardial effusion.   Mitral Valve: The mitral valve is normal in structure. No evidence of  mitral valve regurgitation.   Tricuspid Valve: The tricuspid valve is normal in structure. Tricuspid  valve regurgitation is trivial. No evidence of tricuspid stenosis.   Aortic Valve: The aortic valve is tricuspid. There is mild calcification  of the aortic valve. Aortic valve regurgitation is not visualized. No  aortic stenosis is present.   Pulmonic Valve: The pulmonic valve was grossly normal. Pulmonic valve  regurgitation is not visualized.   Aorta: Aortic dilatation noted. There is borderline dilatation of the  aortic root, measuring 38 mm. There is borderline dilatation of the  ascending aorta, measuring 39 mm.   IAS/Shunts: The atrial septum is grossly normal.   Recent Labs: 06/28/2021: Hemoglobin 11.7; Platelets 366 11/16/2021: ALT 23; BUN 21; Creatinine, Ser 1.16; Magnesium 1.7; NT-Pro BNP 510; Potassium 5.5; Sodium 141  Recent Lipid Panel No results found for: CHOL, TRIG, HDL, CHOLHDL, VLDL, LDLCALC, LDLDIRECT  Physical Exam:    VS:  BP 116/60    Pulse 66    Ht 5' 11.5" (1.816 m)    Wt 290 lb 6.4 oz (131.7 kg)    SpO2 95%    BMI 39.94 kg/m     Wt Readings from Last 3 Encounters:  11/16/21 290 lb 6.4 oz (131.7 kg)  06/28/21 285 lb 14.4 oz (129.7 kg)  02/15/21 293 lb (132.9 kg)     GEN: Well nourished, well developed in no acute distress HEENT: Normal NECK: No JVD; No carotid bruits LYMPHATICS: No lymphadenopathy CARDIAC: S1S2 noted,RRR, no murmurs, rubs, gallops RESPIRATORY:  Clear to auscultation without rales, wheezing or rhonchi  ABDOMEN: Soft, non-tender, non-distended, +bowel sounds, no guarding. EXTREMITIES: +3 bilateral pretibial edema, No cyanosis, no clubbing MUSCULOSKELETAL:  No deformity  SKIN: Warm and dry NEUROLOGIC:  Alert and oriented x 3, non-focal PSYCHIATRIC:   Normal affect, good insight  ASSESSMENT:    1. Acute on chronic combined systolic and diastolic CHF (congestive heart failure) ([place])   2. Medication management   3. Atrial flutter, unspecified type ([place])   4. Morbid obesity ([place])    PLAN:     Clinically he appears to be volume overloaded per his physical exam.  Ideally it would be best for the patient to be able to go to the emergency department and be admitted for IV diuretics to help especially since he is in atrial flutter at which time we can be able to cardiovert him if he does not continuously convert.  But unfortunately the patient has declined this recommendation.  At this time I have no choice but to try to help diurese him in the outpatient setting.  What I plan to do is give him metolazone 5 mg which she can take 30 minutes before his torsemide dosing.  He takes torsemide 60 mg Daily.  We will arrange for the patient to be seen within a week to see if there is any improvement and or any need to adjust the diuretics.  Unclear how long he has been in flutter as his labs visit he was in sinus rhythm.  I suspect this may be contributing to his volume overload as well.  Unfortunately he has declining hospital admission for his heart failure exacerbation as well as considering rhythm control for cardioversion while inpatient.  We will get blood work today including BMP, mag, proBNP  The patient is in agreement with the above plan. The patient left the office in stable condition.  The patient will follow up in 1 week   Medication Adjustments/Labs and Tests Ordered: Current medicines are reviewed at length with the patient today.  Concerns regarding medicines are outlined above.  Orders Placed This Encounter  Procedures   Basic Metabolic Panel (BMET)   Magnesium   Hepatic function panel   Pro b natriuretic peptide (BNP)   EKG 12-Lead   Meds ordered this encounter  Medications   metolazone (ZAROXOLYN) 5 MG tablet    Sig: Take 1  tablet (5 mg total) by mouth daily. Take 30 minutes before Torsemide.    Dispense:  5 tablet    Refill:  0    Patient Instructions  Medication Instructions:  Your physician has recommended you make the following change in your medication:  START: Metolazone for 5 days. Take 30 minutes before Torsemide.   I would like you to go to the Emergency Department but you have declined.   *If you need a refill on your cardiac medications before your next appointment, please call your pharmacy*   Lab Work: Your physician recommends that you return for lab work in:  TODAY: BMET, Mag, LFTs, BNP If you have labs (blood work) drawn today and your tests are completely normal, you will receive your results only by: MyChart Message (if you have MyChart) OR A paper copy in the mail If you have any lab test that is abnormal or we need to change your treatment, we will call you to review the results.   Testing/Procedures: None   Follow-Up: At [facility], you and your health needs are our priority.  As part of our continuing mission to provide you with exceptional heart care, we have created designated Provider Care Teams.  These Care Teams include your primary Cardiologist (physician) and Advanced Practice Providers (APPs -  Physician Assistants and Nurse Practitioners) who all work together to provide you with the care you need, when you need it.  We recommend signing up for the patient portal called "MyChart".  Sign up information is provided on this After Visit Summary.  MyChart is used to connect with patients for Virtual Visits (Telemedicine).  Patients are able to view lab/test results, encounter notes, upcoming appointments, etc.  Non-urgent messages can be sent to your provider as well.   To learn more about what you can do with MyChart, go to [email or web address].    The format for your next appointment:   In Person  Provider:   [name], MD  Or App at [address] in 1  week.       Adopting a Healthy Lifestyle.  Know what a healthy weight is for you (roughly BMI <25) and aim to maintain this   Aim for 7+ servings of fruits and vegetables daily   65-80+ fluid ounces of water or unsweet tea for healthy kidneys   Limit to max 1 drink of alcohol per day; avoid smoking/tobacco   Limit animal fats in diet for cholesterol and heart health - choose grass fed whenever available   Avoid highly processed foods, and foods high in saturated/trans fats   Aim for low stress - take time to unwind and care for your mental health   Aim for 150 min of moderate intensity exercise weekly for heart health, and weights twice weekly for bone health   Aim for 7-9 hours of sleep daily   When it comes to diets, agreement about the perfect plan isnt easy to find, even among the experts. Experts at the [employer] developed an idea known as the Healthy Eating Plate. Just imagine a plate divided into logical, healthy portions.   The emphasis is on diet quality:  Load up on vegetables and fruits - one-half of your plate: Aim for color and variety, and remember that potatoes dont count.   Go for whole grains - one-quarter of your plate: Whole wheat, barley, wheat berries, quinoa, oats, brown rice, and foods made with them. If you want pasta, go with whole wheat pasta.   Protein power - one-quarter of your plate: Fish, chicken, beans, and nuts are all healthy, versatile protein sources. Limit red meat.   The diet, however, does go beyond the plate, offering a few other suggestions.   Use healthy plant oils, such as olive, canola, soy, corn, sunflower and peanut. Check the labels, and avoid partially hydrogenated oil, which have unhealthy trans fats.   If youre thirsty, drink water. Coffee and tea are good in moderation, but skip sugary drinks and limit milk and dairy products to one or two daily servings.   The type of carbohydrate in the diet is  more important than the amount. Some sources of carbohydrates, such as vegetables, fruits, whole grains, and beans-are healthier than others.   Finally, stay active  Signed, Berniece Salines, DO  11/20/2021 2:15 PM    [facility]

## 2021-11-17 DIAGNOSIS — M25552 Pain in left hip: Secondary | ICD-10-CM | POA: Diagnosis not present

## 2021-11-17 LAB — BASIC METABOLIC PANEL
BUN/Creatinine Ratio: 18 (ref 10–24)
BUN: 21 mg/dL (ref 8–27)
CO2: 28 mmol/L (ref 20–29)
Calcium: 9.5 mg/dL (ref 8.6–10.2)
Chloride: 99 mmol/L (ref 96–106)
Creatinine, Ser: 1.16 mg/dL (ref 0.76–1.27)
Glucose: 169 mg/dL — ABNORMAL HIGH (ref 70–99)
Potassium: 5.5 mmol/L — ABNORMAL HIGH (ref 3.5–5.2)
Sodium: 141 mmol/L (ref 134–144)
eGFR: 64 mL/min/{1.73_m2} (ref >59)

## 2021-11-17 LAB — PRO B NATRIURETIC PEPTIDE: NT-Pro BNP: 510 pg/mL — ABNORMAL HIGH (ref 0–486)

## 2021-11-17 LAB — HEPATIC FUNCTION PANEL
ALT: 23 IU/L (ref 0–44)
AST: 22 IU/L (ref 0–40)
Albumin: 4.2 g/dL (ref 3.7–4.7)
Alkaline Phosphatase: 142 IU/L — ABNORMAL HIGH (ref 44–121)
Bilirubin Total: 0.5 mg/dL (ref 0.0–1.2)
Bilirubin, Direct: 0.18 mg/dL (ref 0.00–0.40)
Total Protein: 6.3 g/dL (ref 6.0–8.5)

## 2021-11-17 LAB — MAGNESIUM: Magnesium: 1.7 mg/dL (ref 1.6–2.3)

## 2021-11-25 ENCOUNTER — Other Ambulatory Visit: Payer: Self-pay

## 2021-11-25 ENCOUNTER — Encounter: Payer: Self-pay | Admitting: Physician Assistant

## 2021-11-25 ENCOUNTER — Ambulatory Visit: Payer: Medicare HMO | Admitting: Physician Assistant

## 2021-11-25 VITALS — BP 100/52 | HR 62 | Ht 71.5 in | Wt 281.2 lb

## 2021-11-25 DIAGNOSIS — R5383 Other fatigue: Secondary | ICD-10-CM | POA: Diagnosis not present

## 2021-11-25 DIAGNOSIS — I5033 Acute on chronic diastolic (congestive) heart failure: Secondary | ICD-10-CM | POA: Diagnosis not present

## 2021-11-25 DIAGNOSIS — E875 Hyperkalemia: Secondary | ICD-10-CM | POA: Diagnosis not present

## 2021-11-25 DIAGNOSIS — I480 Paroxysmal atrial fibrillation: Secondary | ICD-10-CM | POA: Diagnosis not present

## 2021-11-25 MED ORDER — METOLAZONE 5 MG PO TABS: 5.0000 mg | ORAL_TABLET | ORAL | 2 refills | Status: DC

## 2021-11-25 NOTE — Patient Instructions (Addendum)
Medication Instructions:  ? ? ?START TAKING METOLAZONE  5 MG EVERY TUES AND FRI 30 MINUTES BEFORE TAKING TORSEMIDE  ? ?*If you need a refill on your cardiac medications before your next appointment, please call your pharmacy* ? ? ?Lab Work: BMET AND TSH TODAY  ? ?If you have labs (blood work) drawn today and your tests are completely normal, you will receive your results only by: ?MyChart Message (if you have MyChart) OR ?A paper copy in the mail ?If you have any lab test that is abnormal or we need to change your treatment, we will call you to review the results. ? ? ?Testing/Procedures: NONE ORDERED  TODAY ? ? ? ?Follow-Up: ?At [facility], you and your health needs are our priority.  As part of our continuing mission to provide you with exceptional heart care, we have created designated Provider Care Teams.  These Care Teams include your primary Cardiologist (physician) and Advanced Practice Providers (APPs -  Physician Assistants and Nurse Practitioners) who all work together to provide you with the care you need, when you need it. ? ?We recommend signing up for the patient portal called "MyChart".  Sign up information is provided on this After Visit Summary.  MyChart is used to connect with patients for Virtual Visits (Telemedicine).  Patients are able to view lab/test results, encounter notes, upcoming appointments, etc.  Non-urgent messages can be sent to your provider as well.   ?To learn more about what you can do with MyChart, go to [email or web address].   ? ?Your next appointment:   ?1 week(s) ? ?The format for your next appointment:   ?In Person ? ?Provider:   ?Nell Range, PA-C  ? ? ?Other Instructions ? ?

## 2021-11-25 NOTE — Progress Notes (Signed)
?Cardiology Office Note:   ? ?Date:  11/25/2021  ? ?ID:  [name], DOB 02-04-1941, MRN [id number] ? ?PCP:  [name], MD ?  ?[facility] Providers ?Cardiologist:  [name], MD ?Electrophysiologist:  Kohen Reither Grayer, MD I   ? ?Referring MD: [name], MD  ? ?Follow up CHF ? ?History of Present Illness:   ? ?[name] is a 81 y.o. male with a hx of  paroxysmal atrial fibrillation on Eliquis, chronic diastolic CHF, CVA, OSA refuses CPAP, HTN, DM2, PAD, PVD, HLD, morbid obesity, medical non compliance  ? ?Previously seen by Dr. Rayann Heman and thought poor candidate for AF ablation and not PPM candidate. AADs were thought to be risk > benefit with non-compliance. ? ?His last visit with Dr. Tamala Julian was in March 2021 however he seen multiple other providers in the practice and was seen by Oda Kilts, [place] on March 03, 2021.  During that visit per review of the note he was in sinus rhythm, he clinically was doing well and we discussed his chronic conditions with no plan for any acute interventions.  He was advised to follow-up in 6 months. ? ?He was added onto Dr. Terrial Rhodes schedule last week for evaluation of worsening shortness of breath and acute on chronic heart failure.  He was also noted to be in atrial flutter.  She advised going to the ER for IV diuresis but the patient refused.  His weight was 290 pounds at the visit and he was noted to have 3+ bilateral pretibial edema.  She started him on metolazone 5 mg to take prior to his torsemide 60 mg daily.  Follow-up proBNP was mildly elevated around 500 and BMP showed creatinine 1.16 with a potassium of 5.5.  He was told to hold his potassium supplementation for 2 days but he has not been on any potassium supplementation.  ? ?Today he presents to clinic for follow-up. Weight today is 281. His swelling is much improved and he is feeling much better. He has an issue with sleeping and goes to bed at 2am and then wakes up 1pm. He needs 12-13 hours a night  and worries about this. He refuses his CPAP.  ? ?Past Medical History:  ?Diagnosis Date  ? Anxiety   ? Arthritis   ? Basal cell carcinoma 07/18/1991  ? Left nasal brdige (MOHS)  ? Basal cell carcinoma 01/23/1992  ? lower right back-(CX35FU)  ? Basal cell carcinoma 05/20/2003  ? sup-left back (CX35FU)  ? Basal cell carcinoma 07/28/2011  ? post lower neck  ? Basal cell carcinoma 08/20/2008  ? right sideburn(MOHS), sup-Left upper back (CX35FU), sup-mid back (CX35FU), nod-Right lower back )CX35FU), nod-right upperarm (CX35FU)  ? Basal cell carcinoma 06/08/2016  ? sup-Left upper back (CX35FU), mid back (CX35FU), right lower back (CX35FU), nod-Right upperarm (CX35FU)  ? Cancer [facility])   ? skin - basil cell  ? Depression   ? Diabetes mellitus without complication ([place])   ? Dysrhythmia   ? a-fib  ? GERD (gastroesophageal reflux disease)   ? Hyperlipidemia   ? Hypertension   ? Neuropathy   ? Obesity   ? Paroxysmal atrial fibrillation (HCC)   ? Peripheral vascular disease ([place])   ? diabetic neuropathy in both feet  ? 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 in situ left forearm posterior tx after biopsy   ? SCCA (squamous cell carcinoma) of skin 01/20/2021  ? Right Forearm Posterior (in situ)  ? SCCA (squamous cell  carcinoma) of skin 01/20/2021  ? Left Forearm Posterior (in situ)  ? Sleep apnea   ? uses C-pap machine  ? Squamous cell carcinoma of skin 02/11/2013  ? in situ-Right temple (CX35FU)  ? Squamous cell carcinoma of skin 08/20/2008  ? in situ- front scalp (CX35FU)  ? Squamous cell carcinoma of skin 06/08/2016  ? in situ-front scalp (CX35FU)  ? Squamous cell carcinoma of skin 02/05/2019  ? in situ-right sideburn-sup (CX35FU), in situ-right sideburn,inf (CX35FU)  ? Stroke [facility])   ? 08/09/2017  ? Superficial basal cell carcinoma (BCC) 01/20/2021  ? Scalp  ? ? ?Past Surgical History:  ?Procedure Laterality Date  ? APPENDECTOMY  1962  ? BACK SURGERY  00-02-12  ? x3  ? BASAL CELL CARCINOMA EXCISION  93/06/10  ?  COLONOSCOPY    ? KNEE ARTHROSCOPY  005/01/02  ? 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  ? Procedure: TOTAL LEFT KNEE ARTHROPLASTY;  Surgeon: [name], MD;  Location: [facility];  Service: Orthopedics;  Laterality: Left;  ? TOTAL KNEE ARTHROPLASTY Right 10/08/2018  ? Procedure: RIGHT TOTAL KNEE ARTHROPLASTY;  Surgeon: [name], MD;  Location: [facility];  Service: Orthopedics;  Laterality: Right;  9mn  ? ? ?Current Medications: ?Current Meds  ?Medication Sig  ? ALPRAZolam (XANAX) 0.5 MG tablet Take 0.5 mg by mouth 2 (two) times daily.  ? apixaban (ELIQUIS) 5 MG TABS tablet Take 1 tablet by mouth twice daily  ? atorvastatin (LIPITOR) 80 MG tablet Take 1 tablet (80 mg total) by mouth daily at 6 PM.  ? benazepril (LOTENSIN) 40 MG tablet Take 1 tablet (40 mg total) by mouth daily.  ? ergocalciferol (VITAMIN D2) 50000 units capsule Take 50,000 Units by mouth once a week.   ? gabapentin (NEURONTIN) 600 MG tablet Take 600 mg by mouth 2 (two) times daily.  ? glucose blood (ONETOUCH ULTRA) test strip USE 1 STRIP TO CHECK GLUCOSE 4 TIMES DAILY  ? hydroxyurea (HYDREA) 500 MG capsule TAKE 2 CAPSULES BY MOUTH IN THE MORNING AND 1 IN THE EVENING  ? insulin NPH-regular Human (NOVOLIN 70/30) (70-30) 100 UNIT/ML injection Inject 44 Units into the skin 2 (two) times daily with a meal.  ? levothyroxine (SYNTHROID) 75 MCG tablet TAKE 1 TABLET BY MOUTH IN THE MORNING ON AN EMPTY STOMACH 30 MINUTES BEFORE MEAL(S) AND OTHER MEDS  ? metFORMIN (GLUCOPHAGE) 1000 MG tablet Take 500 mg by mouth 2 (two) times daily.  ? metoprolol tartrate (LOPRESSOR) 50 MG tablet Take 50 mg by mouth 2 (two) times daily.  ? Multiple Vitamin (MULTIVITAMIN WITH MINERALS) TABS tablet Take 1 tablet by mouth daily.  ? mupirocin ointment (BACTROBAN) 2 % Apply 1 application topically 2 (two) times daily.  ? omeprazole (PRILOSEC) 20 MG capsule Take 20 mg by mouth daily.  ? ONETOUCH VERIO test strip SMARTSIG:Via Meter  ? tamsulosin (FLOMAX) 0.4 MG CAPS capsule Take  0.4 mg by mouth daily.  ? torsemide (DEMADEX) 20 MG tablet Take 3 tablets (60 mg total) by mouth daily.  ? venlafaxine XR (EFFEXOR-XR) 75 MG 24 hr capsule Take 75 mg by mouth daily.  ? [DISCONTINUED] metolazone (ZAROXOLYN) 5 MG tablet Take 1 tablet (5 mg total) by mouth daily. Take 30 minutes before Torsemide.  ?  ? ?Allergies:   Penicillin g sodium  ? ?Social History  ? ?Socioeconomic History  ? Marital status: Married  ?  Spouse name: Not on file  ? Number of children: Not on file  ? Years of education: Not on  file  ? Highest education level: Not on file  ?Occupational History  ? Not on file  ?Tobacco Use  ? Smoking status: Former  ?  Packs/day: 1.00  ?  Years: 30.00  ?  Pack years: 30.00  ?  Types: Cigars, Cigarettes  ?  Quit date: 08/02/1992  ?  Years since quitting: 29.3  ? Smokeless tobacco: Never  ?Vaping Use  ? Vaping Use: Never used  ?Substance and Sexual Activity  ? Alcohol use: Yes  ?  Alcohol/week: 0.0 standard drinks  ?  Comment: 1 beer a night.   ? Drug use: No  ? Sexual activity: Not on file  ?Other Topics Concern  ? Not on file  ?Social History Narrative  ? Not on file  ? ?Social Determinants of Health  ? ?Financial Resource Strain: Not on file  ?Food Insecurity: Not on file  ?Transportation Needs: Not on file  ?Physical Activity: Not on file  ?Stress: Not on file  ?Social Connections: Not on file  ?  ? ?Family History: ?The patient's family history includes CVA in his father; Cancer in his mother; Diabetes Mellitus II in his mother; Heart failure in his father; Hypertension in his mother and sister. There is no history of Colon cancer. ? ?ROS:   ?Please see the history of present illness.    ? All other systems reviewed and are negative. ? ?EKGs/Labs/Other Studies Reviewed:   ? ?The following studies were reviewed today: ? ?  ?TTE 12/30/2020 ?IMPRESSIONS  ? 1. Left ventricular ejection fraction, by estimation, is 60 to 65%. The  ?left ventricle has normal function. The left ventricle has no regional   ?wall motion abnormalities. There is mild concentric left ventricular  ?hypertrophy. Left ventricular diastolic  ?parameters are indeterminate.  ? 2. Right ventricular systolic function is normal. The ri

## 2021-11-26 LAB — BASIC METABOLIC PANEL
BUN/Creatinine Ratio: 27 — ABNORMAL HIGH (ref 10–24)
BUN: 40 mg/dL — ABNORMAL HIGH (ref 8–27)
CO2: 26 mmol/L (ref 20–29)
Calcium: 9.3 mg/dL (ref 8.6–10.2)
Chloride: 92 mmol/L — ABNORMAL LOW (ref 96–106)
Creatinine, Ser: 1.48 mg/dL — ABNORMAL HIGH (ref 0.76–1.27)
Glucose: 350 mg/dL — ABNORMAL HIGH (ref 70–99)
Potassium: 5 mmol/L (ref 3.5–5.2)
Sodium: 131 mmol/L — ABNORMAL LOW (ref 134–144)
eGFR: 48 mL/min/{1.73_m2} — ABNORMAL LOW (ref >59)

## 2021-11-26 LAB — TSH: TSH: 1.72 u[IU]/mL (ref 0.450–4.500)

## 2021-12-01 NOTE — Progress Notes (Signed)
?Cardiology Office Note:   ? ?Date:  12/02/2021  ? ?ID:  [name], DOB 01/06/1941, MRN [id number] ? ?PCP:  [name], MD ?  ?[facility] Providers ?Cardiologist:  [name], MD ?Electrophysiologist:  Jearlene Bridwell Grayer, MD I   ? ?Referring MD: [name], MD  ? ?Follow up CHF ? ?History of Present Illness:   ? ?[name] is a 81 y.o. male with a hx of  paroxysmal atrial fibrillation on Eliquis, chronic diastolic CHF, CVA, OSA refuses CPAP, HTN, DM2, PAD, PVD, HLD, morbid obesity, medical non compliance  ? ?Previously seen by Dr. Rayann Heman and thought poor candidate for AF ablation and not PPM candidate. AADs were thought to be risk > benefit with non-compliance. ? ?His last visit with Dr. Tamala Julian was in March 2021 however he seen multiple other providers in the practice and was seen by Oda Kilts, [place] on March 03, 2021.  During that visit per review of the note he was in sinus rhythm, he clinically was doing well and we discussed his chronic conditions with no plan for any acute interventions.  He was advised to follow-up in 6 months. ? ?He was added onto Dr. Terrial Rhodes schedule last week for evaluation of worsening shortness of breath and acute on chronic heart failure.  He was also noted to be in atrial flutter.  She advised going to the ER for IV diuresis but the patient refused.  His weight was 290 pounds at the visit and he was noted to have 3+ bilateral pretibial edema.  She started him on metolazone 5 mg to take prior to his torsemide 60 mg daily.  Follow-up proBNP was mildly elevated around 500 and BMP showed creatinine 1.16 with a potassium of 5.5.  He was told to hold his potassium supplementation for 2 days but he has not been on any potassium supplementation.  ? ?He was seen for follow up on 11/25/21 for follow up. His weight was down 9lbs to 281 and LE edema improved. Metolazone transitioned to twice a week on Tuesdays and Fridays. Labs showed creat up to 1.48 from 1.16. Also TSH checked  given sleepiness and was normal  ? ?Today he presents to clinic for follow-up. He has had some dizziness when standing from sitting. Also has been having dizziness all day today. He had a fall last week trying to get up some stairs without rails. Skinned his leg and thinks it might be infected. Didn't sleep well last night. Usually needs like 12+hours of sleep. Weight has been stable and LE edema good too.  ? ?Past Medical History:  ?Diagnosis Date  ? Anxiety   ? Arthritis   ? Basal cell carcinoma 07/18/1991  ? Left nasal brdige (MOHS)  ? Basal cell carcinoma 01/23/1992  ? lower right back-(CX35FU)  ? Basal cell carcinoma 05/20/2003  ? sup-left back (CX35FU)  ? Basal cell carcinoma 07/28/2011  ? post lower neck  ? Basal cell carcinoma 08/20/2008  ? right sideburn(MOHS), sup-Left upper back (CX35FU), sup-mid back (CX35FU), nod-Right lower back )CX35FU), nod-right upperarm (CX35FU)  ? Basal cell carcinoma 06/08/2016  ? sup-Left upper back (CX35FU), mid back (CX35FU), right lower back (CX35FU), nod-Right upperarm (CX35FU)  ? Cancer [facility])   ? skin - basil cell  ? Depression   ? Diabetes mellitus without complication ([place])   ? Dysrhythmia   ? a-fib  ? GERD (gastroesophageal reflux disease)   ? Hyperlipidemia   ? Hypertension   ? Neuropathy   ?  Obesity   ? Paroxysmal atrial fibrillation (HCC)   ? Peripheral vascular disease ([place])   ? diabetic neuropathy in both feet  ? 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 in situ left forearm posterior tx after biopsy   ? SCCA (squamous cell carcinoma) of skin 01/20/2021  ? Right Forearm Posterior (in situ)  ? SCCA (squamous cell carcinoma) of skin 01/20/2021  ? Left Forearm Posterior (in situ)  ? Sleep apnea   ? uses C-pap machine  ? Squamous cell carcinoma of skin 02/11/2013  ? in situ-Right temple (CX35FU)  ? Squamous cell carcinoma of skin 08/20/2008  ? in situ- front scalp (CX35FU)  ? Squamous cell carcinoma of skin 06/08/2016  ? in situ-front scalp (CX35FU)  ?  Squamous cell carcinoma of skin 02/05/2019  ? in situ-right sideburn-sup (CX35FU), in situ-right sideburn,inf (CX35FU)  ? Stroke [facility])   ? 08/09/2017  ? Superficial basal cell carcinoma (BCC) 01/20/2021  ? Scalp  ? ? ?Past Surgical History:  ?Procedure Laterality Date  ? APPENDECTOMY  1962  ? BACK SURGERY  00-02-12  ? x3  ? BASAL CELL CARCINOMA EXCISION  93/06/10  ? COLONOSCOPY    ? KNEE ARTHROSCOPY  005/01/02  ? 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  ? Procedure: TOTAL LEFT KNEE ARTHROPLASTY;  Surgeon: [name], MD;  Location: [facility];  Service: Orthopedics;  Laterality: Left;  ? TOTAL KNEE ARTHROPLASTY Right 10/08/2018  ? Procedure: RIGHT TOTAL KNEE ARTHROPLASTY;  Surgeon: [name], MD;  Location: [facility];  Service: Orthopedics;  Laterality: Right;  74mn  ? ? ?Current Medications: ?Current Meds  ?Medication Sig  ? ALPRAZolam (XANAX) 0.5 MG tablet Take 0.5 mg by mouth 2 (two) times daily.  ? apixaban (ELIQUIS) 5 MG TABS tablet Take 1 tablet by mouth twice daily  ? atorvastatin (LIPITOR) 80 MG tablet Take 1 tablet (80 mg total) by mouth daily at 6 PM.  ? ergocalciferol (VITAMIN D2) 50000 units capsule Take 50,000 Units by mouth once a week.   ? gabapentin (NEURONTIN) 600 MG tablet Take 600 mg by mouth 2 (two) times daily.  ? glucose blood (ONETOUCH ULTRA) test strip USE 1 STRIP TO CHECK GLUCOSE 4 TIMES DAILY  ? hydroxyurea (HYDREA) 500 MG capsule TAKE 2 CAPSULES BY MOUTH IN THE MORNING AND 1 IN THE EVENING  ? insulin NPH-regular Human (NOVOLIN 70/30) (70-30) 100 UNIT/ML injection Inject 44 Units into the skin 2 (two) times daily with a meal.  ? levothyroxine (SYNTHROID) 75 MCG tablet TAKE 1 TABLET BY MOUTH IN THE MORNING ON AN EMPTY STOMACH 30 MINUTES BEFORE MEAL(S) AND OTHER MEDS  ? metFORMIN (GLUCOPHAGE) 1000 MG tablet Take 500 mg by mouth 2 (two) times daily.  ? metolazone (ZAROXOLYN) 5 MG tablet Take 1 tablet (5 mg total) by mouth 2 (two) times a week. Take 30 minutes before Torsemide.on Tuesday and  Friday  ? metoprolol tartrate (LOPRESSOR) 50 MG tablet Take 50 mg by mouth 2 (two) times daily.  ? Multiple Vitamin (MULTIVITAMIN WITH MINERALS) TABS tablet Take 1 tablet by mouth daily.  ? mupirocin ointment (BACTROBAN) 2 % Apply 1 application topically 2 (two) times daily.  ? omeprazole (PRILOSEC) 20 MG capsule Take 20 mg by mouth daily.  ? ONETOUCH VERIO test strip SMARTSIG:Via Meter  ? tamsulosin (FLOMAX) 0.4 MG CAPS capsule Take 0.4 mg by mouth daily.  ? torsemide (DEMADEX) 20 MG tablet Take 3 tablets (60 mg total) by mouth daily.  ? venlafaxine XR (EFFEXOR-XR) 75 MG 24  hr capsule Take 75 mg by mouth daily.  ? [DISCONTINUED] benazepril (LOTENSIN) 40 MG tablet Take 1 tablet (40 mg total) by mouth daily.  ?  ? ?Allergies:   Penicillin g sodium  ? ?Social History  ? ?Socioeconomic History  ? Marital status: Married  ?  Spouse name: Not on file  ? Number of children: Not on file  ? Years of education: Not on file  ? Highest education level: Not on file  ?Occupational History  ? Not on file  ?Tobacco Use  ? Smoking status: Former  ?  Packs/day: 1.00  ?  Years: 30.00  ?  Pack years: 30.00  ?  Types: Cigars, Cigarettes  ?  Quit date: 08/02/1992  ?  Years since quitting: 29.3  ? Smokeless tobacco: Never  ?Vaping Use  ? Vaping Use: Never used  ?Substance and Sexual Activity  ? Alcohol use: Yes  ?  Alcohol/week: 0.0 standard drinks  ?  Comment: 1 beer a night.   ? Drug use: No  ? Sexual activity: Not on file  ?Other Topics Concern  ? Not on file  ?Social History Narrative  ? Not on file  ? ?Social Determinants of Health  ? ?Financial Resource Strain: Not on file  ?Food Insecurity: Not on file  ?Transportation Needs: Not on file  ?Physical Activity: Not on file  ?Stress: Not on file  ?Social Connections: Not on file  ?  ? ?Family History: ?The patient's family history includes CVA in his father; Cancer in his mother; Diabetes Mellitus II in his mother; Heart failure in his father; Hypertension in his mother and sister.  There is no history of Colon cancer. ? ?ROS:   ?Please see the history of present illness.    ? All other systems reviewed and are negative. ? ?EKGs/Labs/Other Studies Reviewed:   ? ?The following studies were r

## 2021-12-02 ENCOUNTER — Encounter: Payer: Self-pay | Admitting: Physician Assistant

## 2021-12-02 ENCOUNTER — Other Ambulatory Visit: Payer: Self-pay

## 2021-12-02 ENCOUNTER — Ambulatory Visit: Payer: Medicare HMO | Admitting: Physician Assistant

## 2021-12-02 VITALS — BP 100/56 | HR 62 | Ht 71.75 in | Wt 281.8 lb

## 2021-12-02 DIAGNOSIS — I5033 Acute on chronic diastolic (congestive) heart failure: Secondary | ICD-10-CM | POA: Diagnosis not present

## 2021-12-02 DIAGNOSIS — N179 Acute kidney failure, unspecified: Secondary | ICD-10-CM | POA: Diagnosis not present

## 2021-12-02 DIAGNOSIS — I480 Paroxysmal atrial fibrillation: Secondary | ICD-10-CM

## 2021-12-02 DIAGNOSIS — G4733 Obstructive sleep apnea (adult) (pediatric): Secondary | ICD-10-CM | POA: Diagnosis not present

## 2021-12-02 DIAGNOSIS — I10 Essential (primary) hypertension: Secondary | ICD-10-CM

## 2021-12-02 DIAGNOSIS — E875 Hyperkalemia: Secondary | ICD-10-CM

## 2021-12-02 NOTE — Patient Instructions (Signed)
Medication Instructions:  ?Your physician has recommended you make the following change in your medication:  ?STOP BENAZEPRIL   ?*If you need a refill on your cardiac medications before your next appointment, please call your pharmacy* ? ? ?Lab Work: ?TODAY: BMET ?If you have labs (blood work) drawn today and your tests are completely normal, you will receive your results only by: ?MyChart Message (if you have MyChart) OR ?A paper copy in the mail ?If you have any lab test that is abnormal or we need to change your treatment, we will call you to review the results. ? ? ?Testing/Procedures: ?NONE ? ? ?Follow-Up: ?At [facility], you and your health needs are our priority.  As part of our continuing mission to provide you with exceptional heart care, we have created designated Provider Care Teams.  These Care Teams include your primary Cardiologist (physician) and Advanced Practice Providers (APPs -  Physician Assistants and Nurse Practitioners) who all work together to provide you with the care you need, when you need it. ? ?We recommend signing up for the patient portal called "MyChart".  Sign up information is provided on this After Visit Summary.  MyChart is used to connect with patients for Virtual Visits (Telemedicine).  Patients are able to view lab/test results, encounter notes, upcoming appointments, etc.  Non-urgent messages can be sent to your provider as well.   ?To learn more about what you can do with MyChart, go to [email or web address].   ? ?Your next appointment:   ?KEEP SCHEDULED FOLLOW-UP ?

## 2021-12-03 LAB — BASIC METABOLIC PANEL
BUN/Creatinine Ratio: 31 — ABNORMAL HIGH (ref 10–24)
BUN: 46 mg/dL — ABNORMAL HIGH (ref 8–27)
CO2: 28 mmol/L (ref 20–29)
Calcium: 9.8 mg/dL (ref 8.6–10.2)
Chloride: 96 mmol/L (ref 96–106)
Creatinine, Ser: 1.47 mg/dL — ABNORMAL HIGH (ref 0.76–1.27)
Glucose: 326 mg/dL — ABNORMAL HIGH (ref 70–99)
Potassium: 5.5 mmol/L — ABNORMAL HIGH (ref 3.5–5.2)
Sodium: 136 mmol/L (ref 134–144)
eGFR: 49 mL/min/{1.73_m2} — ABNORMAL LOW (ref >59)

## 2021-12-06 ENCOUNTER — Telehealth: Payer: Self-pay

## 2021-12-06 NOTE — Telephone Encounter (Signed)
Left message for patient to call office for results. °

## 2021-12-06 NOTE — Telephone Encounter (Signed)
-----   Message from Eileen Stanford, PA-C sent at 12/03/2021  9:58 AM EDT ----- ?Kidney function similar to previous. Potassium is high again. I stopped Benazapril in the office yesterday. He will need to stay off this. Repeat labs in 1.5 weeks ? ?

## 2021-12-14 DIAGNOSIS — I700 Atherosclerosis of aorta: Secondary | ICD-10-CM | POA: Diagnosis not present

## 2021-12-14 DIAGNOSIS — E1140 Type 2 diabetes mellitus with diabetic neuropathy, unspecified: Secondary | ICD-10-CM | POA: Diagnosis not present

## 2021-12-14 DIAGNOSIS — E1142 Type 2 diabetes mellitus with diabetic polyneuropathy: Secondary | ICD-10-CM | POA: Diagnosis not present

## 2021-12-14 DIAGNOSIS — E785 Hyperlipidemia, unspecified: Secondary | ICD-10-CM | POA: Diagnosis not present

## 2021-12-14 DIAGNOSIS — D473 Essential (hemorrhagic) thrombocythemia: Secondary | ICD-10-CM | POA: Diagnosis not present

## 2021-12-14 DIAGNOSIS — R269 Unspecified abnormalities of gait and mobility: Secondary | ICD-10-CM | POA: Diagnosis not present

## 2021-12-14 DIAGNOSIS — I5032 Chronic diastolic (congestive) heart failure: Secondary | ICD-10-CM | POA: Diagnosis not present

## 2021-12-14 DIAGNOSIS — F419 Anxiety disorder, unspecified: Secondary | ICD-10-CM | POA: Diagnosis not present

## 2021-12-14 DIAGNOSIS — N1831 Chronic kidney disease, stage 3a: Secondary | ICD-10-CM | POA: Diagnosis not present

## 2021-12-14 DIAGNOSIS — I10 Essential (primary) hypertension: Secondary | ICD-10-CM | POA: Diagnosis not present

## 2021-12-23 DIAGNOSIS — M1612 Unilateral primary osteoarthritis, left hip: Secondary | ICD-10-CM | POA: Diagnosis not present

## 2021-12-23 DIAGNOSIS — M25552 Pain in left hip: Secondary | ICD-10-CM | POA: Diagnosis not present

## 2022-01-05 DIAGNOSIS — Z794 Long term (current) use of insulin: Secondary | ICD-10-CM | POA: Diagnosis not present

## 2022-01-05 DIAGNOSIS — E113512 Type 2 diabetes mellitus with proliferative diabetic retinopathy with macular edema, left eye: Secondary | ICD-10-CM | POA: Diagnosis not present

## 2022-01-05 DIAGNOSIS — E113591 Type 2 diabetes mellitus with proliferative diabetic retinopathy without macular edema, right eye: Secondary | ICD-10-CM | POA: Diagnosis not present

## 2022-01-05 DIAGNOSIS — H3589 Other specified retinal disorders: Secondary | ICD-10-CM | POA: Diagnosis not present

## 2022-01-17 DIAGNOSIS — E119 Type 2 diabetes mellitus without complications: Secondary | ICD-10-CM | POA: Diagnosis not present

## 2022-01-17 DIAGNOSIS — E1140 Type 2 diabetes mellitus with diabetic neuropathy, unspecified: Secondary | ICD-10-CM | POA: Insufficient documentation

## 2022-01-17 DIAGNOSIS — E1142 Type 2 diabetes mellitus with diabetic polyneuropathy: Secondary | ICD-10-CM | POA: Diagnosis not present

## 2022-01-18 DIAGNOSIS — N1831 Chronic kidney disease, stage 3a: Secondary | ICD-10-CM | POA: Diagnosis not present

## 2022-01-18 DIAGNOSIS — G4733 Obstructive sleep apnea (adult) (pediatric): Secondary | ICD-10-CM | POA: Diagnosis not present

## 2022-01-18 DIAGNOSIS — I129 Hypertensive chronic kidney disease with stage 1 through stage 4 chronic kidney disease, or unspecified chronic kidney disease: Secondary | ICD-10-CM | POA: Diagnosis not present

## 2022-01-18 DIAGNOSIS — R251 Tremor, unspecified: Secondary | ICD-10-CM | POA: Diagnosis not present

## 2022-01-18 DIAGNOSIS — I5032 Chronic diastolic (congestive) heart failure: Secondary | ICD-10-CM | POA: Diagnosis not present

## 2022-01-18 DIAGNOSIS — E1140 Type 2 diabetes mellitus with diabetic neuropathy, unspecified: Secondary | ICD-10-CM | POA: Diagnosis not present

## 2022-01-18 DIAGNOSIS — R296 Repeated falls: Secondary | ICD-10-CM | POA: Diagnosis not present

## 2022-01-18 DIAGNOSIS — E1142 Type 2 diabetes mellitus with diabetic polyneuropathy: Secondary | ICD-10-CM | POA: Diagnosis not present

## 2022-01-18 DIAGNOSIS — S40021A Contusion of right upper arm, initial encounter: Secondary | ICD-10-CM | POA: Diagnosis not present

## 2022-01-18 DIAGNOSIS — I480 Paroxysmal atrial fibrillation: Secondary | ICD-10-CM | POA: Diagnosis not present

## 2022-01-24 ENCOUNTER — Ambulatory Visit: Payer: Medicare HMO | Admitting: Dermatology

## 2022-01-24 ENCOUNTER — Encounter: Payer: Self-pay | Admitting: Dermatology

## 2022-01-24 DIAGNOSIS — D0439 Carcinoma in situ of skin of other parts of face: Secondary | ICD-10-CM | POA: Diagnosis not present

## 2022-01-24 DIAGNOSIS — Z85828 Personal history of other malignant neoplasm of skin: Secondary | ICD-10-CM | POA: Diagnosis not present

## 2022-01-24 DIAGNOSIS — L570 Actinic keratosis: Secondary | ICD-10-CM | POA: Diagnosis not present

## 2022-01-24 DIAGNOSIS — D044 Carcinoma in situ of skin of scalp and neck: Secondary | ICD-10-CM | POA: Diagnosis not present

## 2022-01-24 DIAGNOSIS — D485 Neoplasm of uncertain behavior of skin: Secondary | ICD-10-CM

## 2022-01-24 NOTE — Patient Instructions (Signed)

## 2022-01-27 ENCOUNTER — Telehealth: Payer: Self-pay

## 2022-01-27 ENCOUNTER — Ambulatory Visit: Payer: Medicare HMO | Admitting: Neurology

## 2022-01-27 ENCOUNTER — Encounter: Payer: Self-pay | Admitting: Neurology

## 2022-01-27 VITALS — BP 130/69 | HR 48 | Ht 71.75 in | Wt 279.4 lb

## 2022-01-27 DIAGNOSIS — R251 Tremor, unspecified: Secondary | ICD-10-CM | POA: Diagnosis not present

## 2022-01-27 DIAGNOSIS — Z8673 Personal history of transient ischemic attack (TIA), and cerebral infarction without residual deficits: Secondary | ICD-10-CM

## 2022-01-27 DIAGNOSIS — R269 Unspecified abnormalities of gait and mobility: Secondary | ICD-10-CM | POA: Diagnosis not present

## 2022-01-27 DIAGNOSIS — G253 Myoclonus: Secondary | ICD-10-CM

## 2022-01-27 MED ORDER — TOPIRAMATE 50 MG PO TABS: 50.0000 mg | ORAL_TABLET | Freq: Every evening | ORAL | 3 refills | Status: DC | PRN

## 2022-01-27 NOTE — Telephone Encounter (Signed)
-----   Message from Lavonna Monarch, MD sent at 01/26/2022  3:10 PM EDT ----- Schedule surgery with Dr. Darene Lamer

## 2022-01-27 NOTE — Telephone Encounter (Signed)
Phone call to patient with his pathology results. Voicemail left for patient to give the office a call back.  ?

## 2022-01-27 NOTE — Patient Instructions (Signed)
I had a long discussion with the patient regarding his new complaints of tremors and jerking as well as frequent falls and gait difficulties.  His tremors are likely multifactorial due to his mild renal insufficiency, medication effect effect of gabapentin particularly since he has been taking some extra doses at night.  I recommend we discontinue gabapentin by tapering it to 600 mg once a day for a week and then stopping it.  Instead try Topamax 50 mg at night to help with neuropathic pain and increase if needed and as tolerated.  His gait difficulties are multifactorial due to combination of longstanding diabetic neuropathy as well as right leg weakness from his previous stroke and his obesity.  Patient is refusing referral to physical therapy for gait and balance training.  Discussed fall prevention precautions with him and advised him to use a cane when ambulating at all times.  He will continue on Eliquis for stroke prevention for his atrial fibrillation and maintain aggressive risk factor modification with strict control of hypertension with blood pressure goal below 130/90, diabetes with hemoglobin A1c goal below 6.5% and lipids with LDL cholesterol goal below 70 mg percent.  Return for follow-up in the future in 3 months with my nurse practitioner Janett Billow or call earlier if necessary. Fall Prevention in the Home, Adult Falls can cause injuries and can happen to people of all ages. There are many things you can do to make your home safe and to help prevent falls. Ask for help when making these changes. What actions can I take to prevent falls? General Instructions Use good lighting in all rooms. Replace any light bulbs that burn out. Turn on the lights in dark areas. Use night-lights. Keep items that you use often in easy-to-reach places. Lower the shelves around your home if needed. Set up your furniture so you have a clear path. Avoid moving your furniture around. Do not have throw rugs or other  things on the floor that can make you trip. Avoid walking on wet floors. If any of your floors are uneven, fix them. Add color or contrast paint or tape to clearly mark and help you see: Grab bars or handrails. First and last steps of staircases. Where the edge of each step is. If you use a stepladder: Make sure that it is fully opened. Do not climb a closed stepladder. Make sure the sides of the stepladder are locked in place. Ask someone to hold the stepladder while you use it. Know where your pets are when moving through your home. What can I do in the bathroom?     Keep the floor dry. Clean up any water on the floor right away. Remove soap buildup in the tub or shower. Use nonskid mats or decals on the floor of the tub or shower. Attach bath mats securely with double-sided, nonslip rug tape. If you need to sit down in the shower, use a plastic, nonslip stool. Install grab bars by the toilet and in the tub and shower. Do not use towel bars as grab bars. What can I do in the bedroom? Make sure that you have a light by your bed that is easy to reach. Do not use any sheets or blankets for your bed that hang to the floor. Have a firm chair with side arms that you can use for support when you get dressed. What can I do in the kitchen? Clean up any spills right away. If you need to reach something above you, use  a step stool with a grab bar. Keep electrical cords out of the way. Do not use floor polish or wax that makes floors slippery. What can I do with my stairs? Do not leave any items on the stairs. Make sure that you have a light switch at the top and the bottom of the stairs. Make sure that there are handrails on both sides of the stairs. Fix handrails that are broken or loose. Install nonslip stair treads on all your stairs. Avoid having throw rugs at the top or bottom of the stairs. Choose a carpet that does not hide the edge of the steps on the stairs. Check carpeting to  make sure that it is firmly attached to the stairs. Fix carpet that is loose or worn. What can I do on the outside of my home? Use bright outdoor lighting. Fix the edges of walkways and driveways and fix any cracks. Remove anything that might make you trip as you walk through a door, such as a raised step or threshold. Trim any bushes or trees on paths to your home. Check to see if handrails are loose or broken and that both sides of all steps have handrails. Install guardrails along the edges of any raised decks and porches. Clear paths of anything that can make you trip, such as tools or rocks. Have leaves, snow, or ice cleared regularly. Use sand or salt on paths during winter. Clean up any spills in your garage right away. This includes grease or oil spills. What other actions can I take? Wear shoes that: Have a low heel. Do not wear high heels. Have rubber bottoms. Feel good on your feet and fit well. Are closed at the toe. Do not wear open-toe sandals. Use tools that help you move around if needed. These include: Canes. Walkers. Scooters. Crutches. Review your medicines with your doctor. Some medicines can make you feel dizzy. This can increase your chance of falling. Ask your doctor what else you can do to help prevent falls. Where to find more information Centers for Disease Control and Prevention, STEAD[employer]: http://www.wolf.info/ National Institute on Aging: http://kim-miller.com/ Contact a doctor if: You are afraid of falling at home. You feel weak, drowsy, or dizzy at home. You fall at home. Summary There are many simple things that you can do to make your home safe and to help prevent falls. Ways to make your home safe include removing things that can make you trip and installing grab bars in the bathroom. Ask for help when making these changes in your home. This information is not intended to replace advice given to you by your health care provider. Make sure you discuss any questions  you have with your health care provider. Document Revised: 05/31/2021 Document Reviewed: 04/01/2020 Elsevier Patient Education  [place].

## 2022-01-27 NOTE — Telephone Encounter (Signed)
Phone call from patient returning our call. Pathology results given to patient.  

## 2022-01-27 NOTE — Progress Notes (Signed)
[name] [address] [place]. [place] [address] [phone]       OFFICE FOLLOW-UP NOTE  [name] Date of Birth:  1941-06-06 Medical Record Number:  [id number]   Reason for visit: Stroke follow-up [facility] provider: Dr. Leonie Man   Chief Complaint  Patient presents with   New Patient (Initial Visit)    Rm 17, alone. Pt referred for frequent falls and occasional tremors. Has had 5 falls since April 29th and most recently fall was last night.      HPI:  Stroke admission 07/2017 PS: [name] is a 81 y.o. male with a history of afib on Xarelto who has been having difficulty speaking since awakening this morning. He states that it seems worse at times, butthese episodes of worsening are only for a few seconds. He has also had two episodes of right sided numbness lasting a few seconds as well. 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 .LKW: 11/27 prior to bed. tpa given?: no, out of window.CT scan of the head showed acute small left frontal MCA territory nonhemorrhagic infarct and moderate changes of small vessel disease. 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 CT angiogram of the brain showed moderate stenosis of left M2 and proximal right posterior cerebral arteries.MRI scan of the brain confirmed a small foci of acute infarcts in the posterior left MCA territory involving posterior frontal and posterior parietal lobes likely emboli. Patient had known history of atrial fibrillation and was on Xarelto and yet had breakthrough infarcts.hemoglobin A1c was elevated at 7.8. Patient was changed from Xarelto to eliquis for second stroke prevention.patient had elevated platelet count of 881,000 which was up from a year ago from 623,000. 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 Bone marrow biopsy was discussed but not  done.   10/11/17 visit PS: Patient has been started on hydroxyurea by oncology. Patient states that he still has some intermittent numbness and tingling in his right hand but it is getting better it occurs once or twice a week and last only 30 seconds. This is often triggered by having his neck or arms in strange positions like stretching backwards. He is tolerating eliquis well without bleeding or bruising. He states his blood pressure is well controlled and today it is 130/79. He continues to have trouble with his sugars which remained high and last hemoglobin A1c was 8.1. Patient has chronic right knee pain and actually had scheduled right knee surgery with Dr. Juliette Alcide in February that now is willing to wait for 6 months since his stroke. He does also have sleep apnea but he has not been compliant with CPAP as he cannot tolerate it. He does have chronic paresthesias in his feet from diabetic neuropathy which is stable   Update 04/17/2018: Patient is being seen today for routine stroke follow-up appointment and overall is doing well from a stroke standpoint.  He continues to take Eliquis without bleeding or bruising for his atrial fibrillation and is managed by his cardiologist.  Continues to take Lipitor without side effects of myalgias.  Blood pressure today satisfactory 128/60.  He does have history of bilateral lower extremity diabetic neuropathy for which he takes gabapentin 300 mg at night.  He continues to have neuropathy pain along with possibly worsening neuropathy with numbness and tingling going up into his calfs.  He  has been compliant with gabapentin 300 mg at night but states he does not notice a difference with his neuropathy pain.  He has had a recent fall approximately 1 week ago where he was try to let his dog outside and when he bent over to help him out the door, he fell forward and landed on his right side.  He denies hitting his head but does have mild residual right sided pain. Patient states  his knee limits him from prolonged activity and standing for any length of time along with increased difficulty ambulating.  Patient also has complaints of bilateral lower extremity swelling.  Patient did speak with cardiologist in regards to this and recommended compression stockings but per notes, he was refusing his treatment and no additional intervention needed.  Patient continues to be noncompliant with CPAP stating he is unable to tolerate machine despite risks of not having OSA treated.  Denies new or worsening stroke/TIA symptoms.  Update 05/22/2019: [name] is being seen today for stroke follow-up.  He has been doing well from a stroke standpoint without residual deficits or reoccurring symptoms.  He continues on Eliquis for atrial fibrillation secondary stroke prevention without side effects.  Continues on atorvastatin without myalgias.  Blood pressure today 119/60.  He did undergo right knee arthroplasty on 01/14/6978 without complication.  He recently completed physical therapy but is considering participating in additional sessions.  Glucose levels have been stable. Denies new or worsening stroke/TIA symptoms. He has multiple other complaints including blurred vision, leg swelling, and insomnia.  He is routinely being followed by ophthalmology, cardiology and PCP in regards to these concerns.  Update 01/27/2022 : Patient has been referred back to see me today by Constance Holster, PA-C for new complaints of hand tremors and jerkiness.  Patient states this has been going on for a few months.  There is this is not constant but intermittently when he raises his hands he notices sudden jerk and on occasions he is throwing objects he has been holding from his hand like a pill bottle.  This happens mostly in the morning when he is sitting at the breakfast table and trying to hold objects but can occur off and on all day.  He denies any voice tremor or similar jerking in the legs.  Is also noticed increasing  falls and has had 5 falls in the last 3 weeks with sustaining bruises on his elbows and hips and thighs.  He does have longstanding diabetic neuropathy with paresthesias with poor balance as well as some residual right leg weakness from his previous stroke which could also contribute to his balance.  He has started using a cane but does not find it very helpful and feels he may fall even with a cane or walker if he had 1 in the falls or so sudden.  He does take gabapentin 600 mg at supper and quite often ends up taking another 600 mg at night if he is bothered by the paresthesias.  He has not had any recent lab work that I can see in the last lab a month ago had shown borderline creatinine and elevated potassium.  He does have thrombocytosis but this seems to be well controlled on hydroxyurea.  He remains on Eliquis which is tolerating well we will without significant bleeding but does bruise easily and has multiple bruises on his body.  Patient states he has been to physical therapy multiple times and does not find it helpful and is refusing for  another referral to improve his gait and balance  ROS:   14 system review of systems is positive for tingling numbness burning feet, imbalance, frequent falls, tremors, hand jerking, blurred vision, leg swelling, insomnia, snoring and urgency and all other systems negative  PMH:  Past Medical History:  Diagnosis Date   A-fib ([place])    Anxiety    Arthritis    Basal cell carcinoma 07/18/1991   Left nasal brdige ([facility])   Basal cell carcinoma 01/23/1992   lower right back-(CX35FU)   Basal cell carcinoma 05/20/2003   sup-left back (CX35FU)   Basal cell carcinoma 07/28/2011   post lower neck   Basal cell carcinoma 08/20/2008   right sideburn([facility]), sup-Left upper back (CX35FU), sup-mid back (CX35FU), nod-Right lower back )CX35FU), nod-right upperarm (CX35FU)   Basal cell carcinoma 06/08/2016   sup-Left upper back (CX35FU), mid back (CX35FU), right lower back  (CX35FU), nod-Right upperarm (CX35FU)   Cancer (HCC)    skin - basil cell   Depression    Diabetes mellitus without complication (HCC)    Dysrhythmia    a-fib   GERD (gastroesophageal reflux disease)    Hyperlipidemia    Hypertension    Neuropathy    Obesity    Paroxysmal atrial fibrillation (HCC)    Peripheral vascular disease (HCC)    diabetic neuropathy in both feet   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 (in situ)   Sleep apnea    uses C-pap machine   Squamous cell carcinoma of skin 02/11/2013   in situ-Right temple (CX35FU)   Squamous cell carcinoma of skin 08/20/2008   in situ- front scalp (CX35FU)   Squamous cell carcinoma of skin 06/08/2016   in situ-front scalp (CX35FU)   Squamous cell carcinoma of skin 02/05/2019   in situ-right sideburn-sup (CX35FU), in situ-right sideburn,inf (CX35FU)   Stroke ([place])    08/09/2017   Superficial basal cell carcinoma (BCC) 01/20/2021   Scalp    Social History:  Social History   Socioeconomic History   Marital status: Married    Spouse name: Webb Silversmith   Number of children: Not on file   Years of education: Not on file   Highest education level: Not on file  Occupational History   Not on file  Tobacco Use   Smoking status: Former    Packs/day: 1.00    Years: 30.00    Pack years: 30.00    Types: Cigars, Cigarettes    Quit date: 08/02/1992    Years since quitting: 29.5   Smokeless tobacco: Never  Vaping Use   Vaping Use: Never used  Substance and Sexual Activity   Alcohol use: Yes    Alcohol/week: 0.0 standard drinks    Comment: 1 beer a night.    Drug use: No   Sexual activity: Not on file  Other Topics Concern    Not on file  Social History Narrative   Lives w wife   R handed   Caffeine: 1 C of coffee a day   Social Determinants of Health   Financial Resource Strain: Not on file  Food Insecurity: Not on file  Transportation Needs: Not on file  Physical Activity:  Not on file  Stress: Not on file  Social Connections: Not on file  Intimate Partner Violence: Not on file    Medications:   Current Outpatient Medications on File Prior to Visit  Medication Sig Dispense Refill   ALPRAZolam (XANAX) 0.5 MG tablet Take 0.5 mg by mouth 2 (two) times daily.     apixaban (ELIQUIS) 5 MG TABS tablet Take 1 tablet by mouth twice daily 60 tablet 5   atorvastatin (LIPITOR) 80 MG tablet Take 1 tablet (80 mg total) by mouth daily at 6 PM. 30 tablet 0   ergocalciferol (VITAMIN D2) 50000 units capsule Take 50,000 Units by mouth once a week.      glucose blood (ONETOUCH ULTRA) test strip USE 1 STRIP TO CHECK GLUCOSE 4 TIMES DAILY     hydroxyurea (HYDREA) 500 MG capsule TAKE 2 CAPSULES BY MOUTH IN THE MORNING AND 1 IN THE EVENING 270 capsule 4   insulin NPH-regular Human (NOVOLIN 70/30) (70-30) 100 UNIT/ML injection Inject 44 Units into the skin 2 (two) times daily with a meal.     levothyroxine (SYNTHROID) 75 MCG tablet TAKE 1 TABLET BY MOUTH IN THE MORNING ON AN EMPTY STOMACH 30 MINUTES BEFORE MEAL(S) AND OTHER MEDS     metFORMIN (GLUCOPHAGE) 1000 MG tablet Take 500 mg by mouth 2 (two) times daily.     metolazone (ZAROXOLYN) 5 MG tablet Take 1 tablet (5 mg total) by mouth 2 (two) times a week. Take 30 minutes before Torsemide.on Tuesday and Friday 24 tablet 2   metoprolol tartrate (LOPRESSOR) 50 MG tablet Take 50 mg by mouth 2 (two) times daily.     Multiple Vitamin (MULTIVITAMIN WITH MINERALS) TABS tablet Take 1 tablet by mouth daily.     mupirocin ointment (BACTROBAN) 2 % Apply 1 application topically 2 (two) times daily. 22 g 0   omeprazole (PRILOSEC) 20 MG capsule Take 20 mg by mouth daily.     ONETOUCH  VERIO test strip SMARTSIG:Via Meter     tamsulosin (FLOMAX) 0.4 MG CAPS capsule Take 0.4 mg by mouth daily.     torsemide (DEMADEX) 20 MG tablet Take 3 tablets (60 mg total) by mouth daily. 270 tablet 3   venlafaxine XR (EFFEXOR-XR) 75 MG 24 hr capsule Take 75 mg by mouth daily.     No current facility-administered medications on file prior to visit.    Allergies:   Allergies  Allergen Reactions   Penicillin G Sodium Other (See Comments)    Today's Vitals   01/27/22 1407  BP: 130/69  Pulse: (!) 48  Weight: 279 lb 6.4 oz (126.7 kg)  Height: 5' 11.75" (1.822 m)   Body mass index is 38.16 kg/m.   Physical Exam General: Obese elderly Caucasian male, , seated, in no evident distress Head: head normocephalic and atraumatic.  Neck: supple with no carotid or supraclavicular bruits Cardiovascular: regular rate and rhythm, no murmurs; 1+ pitting edema BLE Musculoskeletal: no deformity Skin:  no rash but multiple bruising left buttock, left upper lateral thigh, bilateral elbows Vascular:  Normal pulses all extremities   Neurologic Exam Mental Status: Awake and fully alert. Oriented to place and time. Recent and remote memory intact. Attention span, concentration and fund of knowledge appropriate. Mood and affect appropriate.  Cranial Nerves: Pupils equal, briskly reactive to light. Extraocular movements full without nystagmus. Visual fields full to confrontation. Hearing intact. Facial sensation intact. Face, tongue, palate moves normally and symmetrically.  Motor: Normal bulk and tone. Normal strength in all tested  extremity muscles except mild weakness of right ankle dorsiflexors and plantar flexors.. Sensory.:  Decreased sensation to touch ,pinprick and vibratory sensation in bilateral lower extremities distally.  Romberg sign is positive. Coordination: Rapid alternating movements normal in all extremities. Finger-to-nose performed accurately and mild difficulty performing  heel-to-shin due to pain Gait and Station: Arises from chair with  difficulty.  Gait demonstrates slightly favoring right knee but otherwise broad-based gait with slight imbalance with use of cane. Reflexes: 1+ and symmetric except ankle jerks are depressed. Toes downgoing.     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 He also has essential thrombocytosis which which appears well controlled on hydroxyurea.  Vascular risk factors of diabetes, hyperlipidemia, obesity, OSA noncompliant with CPAP,atrial fibrillation and essential thrombocytosis.  New complaints of upper extremity tremors and myoclonic jerks likely due to metabolic encephalopathy from his mild renal and electrolyte dysfunction as well as medication effect from gabapentin.  Increasing gait and balance difficulties and frequent falls due to combination of underlying diabetic neuropathy as well as damage from stroke and medication effect.   PLAN: -I had a long discussion with the patient regarding his new complaints of tremors and jerking as well as frequent falls and gait difficulties.  His tremors are likely multifactorial due to his mild renal insufficiency, medication effect effect of gabapentin particularly since he has been taking some extra doses at night.  I recommend we discontinue gabapentin by tapering it to 600 mg once a day for a week and then stopping it.  Instead try Topamax 50 mg at night to help with neuropathic pain and increase if needed and as tolerated.  His gait difficulties are multifactorial due to combination of longstanding diabetic neuropathy as well as right leg weakness from his previous stroke and his obesity.  Patient is refusing referral to physical therapy for gait and balance training.  Discussed fall prevention precautions with him and advised him to use a cane when ambulating at all times.  He will continue on Eliquis  for stroke prevention for his atrial fibrillation and maintain aggressive risk factor modification with strict control of hypertension with blood pressure goal below 130/90, diabetes with hemoglobin A1c goal below 6.5% and lipids with LDL cholesterol goal below 70 mg percent.  Return for follow-up in the future in 3 months with my nurse practitioner Janett Billow   Greater than 50% of time during this prolonged 45 minute visit was spent on counseling,explanation of diagnosis embolic strokes, atrial fibrillation, essential thrombocytosis, peripheral neuropathy,  gait imbalance planning of further management, discussion with patient and family and coordination of care   Antony Contras, MD  [facility] [address] [place], [place] [address]  Phone [phone] Fax [phone] Note: This document was prepared with digital dictation and possible smart phrase technology. Any transcriptional errors that result from this process are unintentional.

## 2022-01-28 DIAGNOSIS — Z794 Long term (current) use of insulin: Secondary | ICD-10-CM | POA: Diagnosis not present

## 2022-01-28 DIAGNOSIS — E1142 Type 2 diabetes mellitus with diabetic polyneuropathy: Secondary | ICD-10-CM | POA: Diagnosis not present

## 2022-01-28 DIAGNOSIS — N1831 Chronic kidney disease, stage 3a: Secondary | ICD-10-CM | POA: Diagnosis not present

## 2022-01-28 DIAGNOSIS — R69 Illness, unspecified: Secondary | ICD-10-CM | POA: Diagnosis not present

## 2022-01-28 DIAGNOSIS — S40021A Contusion of right upper arm, initial encounter: Secondary | ICD-10-CM | POA: Diagnosis not present

## 2022-01-28 DIAGNOSIS — Z7901 Long term (current) use of anticoagulants: Secondary | ICD-10-CM | POA: Diagnosis not present

## 2022-01-28 DIAGNOSIS — I5032 Chronic diastolic (congestive) heart failure: Secondary | ICD-10-CM | POA: Diagnosis not present

## 2022-01-28 DIAGNOSIS — I130 Hypertensive heart and chronic kidney disease with heart failure and stage 1 through stage 4 chronic kidney disease, or unspecified chronic kidney disease: Secondary | ICD-10-CM | POA: Diagnosis not present

## 2022-01-28 DIAGNOSIS — S40021D Contusion of right upper arm, subsequent encounter: Secondary | ICD-10-CM | POA: Diagnosis not present

## 2022-01-28 DIAGNOSIS — E1140 Type 2 diabetes mellitus with diabetic neuropathy, unspecified: Secondary | ICD-10-CM | POA: Diagnosis not present

## 2022-01-28 DIAGNOSIS — I480 Paroxysmal atrial fibrillation: Secondary | ICD-10-CM | POA: Diagnosis not present

## 2022-01-28 DIAGNOSIS — R296 Repeated falls: Secondary | ICD-10-CM | POA: Diagnosis not present

## 2022-01-28 DIAGNOSIS — Z7984 Long term (current) use of oral hypoglycemic drugs: Secondary | ICD-10-CM | POA: Diagnosis not present

## 2022-01-28 DIAGNOSIS — Z8673 Personal history of transient ischemic attack (TIA), and cerebral infarction without residual deficits: Secondary | ICD-10-CM | POA: Diagnosis not present

## 2022-01-28 DIAGNOSIS — E1122 Type 2 diabetes mellitus with diabetic chronic kidney disease: Secondary | ICD-10-CM | POA: Diagnosis not present

## 2022-01-28 LAB — COMPREHENSIVE METABOLIC PANEL
ALT: 22 IU/L (ref 0–44)
AST: 28 IU/L (ref 0–40)
Albumin/Globulin Ratio: 1.7 (ref 1.2–2.2)
Albumin: 3.9 g/dL (ref 3.7–4.7)
Alkaline Phosphatase: 128 IU/L — ABNORMAL HIGH (ref 44–121)
BUN/Creatinine Ratio: 20 (ref 10–24)
BUN: 28 mg/dL — ABNORMAL HIGH (ref 8–27)
Bilirubin Total: 0.8 mg/dL (ref 0.0–1.2)
CO2: 32 mmol/L — ABNORMAL HIGH (ref 20–29)
Calcium: 9.8 mg/dL (ref 8.6–10.2)
Chloride: 91 mmol/L — ABNORMAL LOW (ref 96–106)
Creatinine, Ser: 1.39 mg/dL — ABNORMAL HIGH (ref 0.76–1.27)
Globulin, Total: 2.3 g/dL (ref 1.5–4.5)
Glucose: 240 mg/dL — ABNORMAL HIGH (ref 70–99)
Potassium: 4 mmol/L (ref 3.5–5.2)
Sodium: 137 mmol/L (ref 134–144)
Total Protein: 6.2 g/dL (ref 6.0–8.5)
eGFR: 52 mL/min/{1.73_m2} — ABNORMAL LOW (ref >59)

## 2022-01-28 LAB — CBC
Hematocrit: 30.9 % — ABNORMAL LOW (ref 37.5–51.0)
Hemoglobin: 11.1 g/dL — ABNORMAL LOW (ref 13.0–17.7)
MCH: 40.1 pg — ABNORMAL HIGH (ref 26.6–33.0)
MCHC: 35.9 g/dL — ABNORMAL HIGH (ref 31.5–35.7)
MCV: 112 fL — ABNORMAL HIGH (ref 79–97)
NRBC: 1 % — ABNORMAL HIGH (ref 0–0)
Platelets: 454 10*3/uL — ABNORMAL HIGH (ref 150–450)
RBC: 2.77 x10E6/uL — ABNORMAL LOW (ref 4.14–5.80)
RDW: 18.4 % — ABNORMAL HIGH (ref 11.6–15.4)
WBC: 5.7 10*3/uL (ref 3.4–10.8)

## 2022-01-31 ENCOUNTER — Other Ambulatory Visit: Payer: Self-pay

## 2022-01-31 ENCOUNTER — Inpatient Hospital Stay: Payer: Medicare HMO

## 2022-02-08 NOTE — Progress Notes (Signed)
Cardiology Office Note:    Date:  02/10/2022   ID:  [name], DOB 01-15-1941, MRN [id number]  PCP:  [name], MD  Cardiologist:  [name], MD   Referring MD: [name], MD   Chief Complaint  Patient presents with   Atrial Fibrillation   Congestive Heart Failure   Follow-up    OSA    History of Present Illness:    [name] is a 81 y.o. male with a hx of   paroxysmal atrial fibrillation on Eliquis, chronic diastolic CHF, CVA, OSA refuses CPAP, HTN, DM2, PAD, PVD, HLD, morbid obesity, medical non compliance     Feels weak and tired all the time.  Says his vision is blurry.  Denies chest pain, orthopnea, PND, but does have mild lower extremity edema.  Able to lay flat.  No syncope, angina, or other complaints.  LAST VISIT PROBLEM LIST  1. Acute on chronic heart failure with preserved ejection fraction (HFpEF) (HCC)   2. Paroxysmal atrial fibrillation ([place])   3. Hyperkalemia   4. Morbid obesity ([place])   5. Essential hypertension   6. Obstructive sleep apnea   7. AKI (acute kidney injury) ([place])    Past Medical History:  Diagnosis Date   A-fib ([place])    Anxiety    Arthritis    Basal cell carcinoma 07/18/1991   Left nasal brdige ([facility])   Basal cell carcinoma 01/23/1992   lower right back-(CX35FU)   Basal cell carcinoma 05/20/2003   sup-left back (CX35FU)   Basal cell carcinoma 07/28/2011   post lower neck   Basal cell carcinoma 08/20/2008   right sideburn([facility]), sup-Left upper back (CX35FU), sup-mid back (CX35FU), nod-Right lower back )CX35FU), nod-right upperarm (CX35FU)   Basal cell carcinoma 06/08/2016   sup-Left upper back (CX35FU), mid back (CX35FU), right lower back (CX35FU), nod-Right upperarm (CX35FU)   Cancer (HCC)    skin - basil cell   Depression    Diabetes mellitus without complication (HCC)    Dysrhythmia    a-fib   GERD (gastroesophageal reflux disease)    Hyperlipidemia    Hypertension    Neuropathy    Obesity     Paroxysmal atrial fibrillation (HCC)    Peripheral vascular disease (HCC)    diabetic neuropathy in both feet   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 (in situ)   Sleep apnea    uses C-pap machine   Squamous cell carcinoma of skin 02/11/2013   in situ-Right temple (CX35FU)   Squamous cell carcinoma of skin 08/20/2008   in situ- front scalp (CX35FU)   Squamous cell carcinoma of skin 06/08/2016   in situ-front scalp (CX35FU)   Squamous cell carcinoma of skin 02/05/2019   in situ-right sideburn-sup (CX35FU), in situ-right sideburn,inf (CX35FU)   Stroke ([place])    08/09/2017   Superficial basal cell carcinoma (BCC) 01/20/2021   Scalp    Past Surgical History:  Procedure Laterality Date   APPENDECTOMY  1962   BACK SURGERY  00-02-12   x3   BASAL CELL CARCINOMA EXCISION  93/06/10   COLONOSCOPY     KNEE ARTHROSCOPY  005/01/02   TOTAL KNEE  ARTHROPLASTY Left 11/02/2015   Procedure: TOTAL LEFT KNEE ARTHROPLASTY;  Surgeon: [name], MD;  Location: [facility];  Service: Orthopedics;  Laterality: Left;   TOTAL KNEE ARTHROPLASTY Right 10/08/2018   Procedure: RIGHT TOTAL KNEE ARTHROPLASTY;  Surgeon: [name], MD;  Location: [facility];  Service: Orthopedics;  Laterality: Right;  85mn    Current Medications: Current Meds  Medication Sig   ALPRAZolam (XANAX) 0.5 MG tablet Take 0.5 mg by mouth 2 (two) times daily.   apixaban (ELIQUIS) 5 MG TABS tablet Take 1 tablet by mouth twice daily   atorvastatin (LIPITOR) 80 MG tablet Take 1 tablet (80 mg total) by mouth daily at 6 PM.   ergocalciferol (VITAMIN D2) 50000 units capsule Take 50,000 Units by mouth once a week.    glucose blood  (ONETOUCH ULTRA) test strip USE 1 STRIP TO CHECK GLUCOSE 4 TIMES DAILY   hydroxyurea (HYDREA) 500 MG capsule TAKE 2 CAPSULES BY MOUTH IN THE MORNING AND 1 IN THE EVENING   insulin NPH-regular Human (NOVOLIN 70/30) (70-30) 100 UNIT/ML injection Inject 44 Units into the skin 2 (two) times daily with a meal.   levothyroxine (SYNTHROID) 75 MCG tablet TAKE 1 TABLET BY MOUTH IN THE MORNING ON AN EMPTY STOMACH 30 MINUTES BEFORE MEAL(S) AND OTHER MEDS   metFORMIN (GLUCOPHAGE) 1000 MG tablet Take 500 mg by mouth 2 (two) times daily.   metolazone (ZAROXOLYN) 2.5 MG tablet Take 1 tablet (2.5 mg total) by mouth daily.   metoprolol tartrate (LOPRESSOR) 50 MG tablet Take 50 mg by mouth 2 (two) times daily.   Multiple Vitamin (MULTIVITAMIN WITH MINERALS) TABS tablet Take 1 tablet by mouth daily.   mupirocin ointment (BACTROBAN) 2 % Apply 1 application topically 2 (two) times daily.   omeprazole (PRILOSEC) 20 MG capsule Take 20 mg by mouth daily.   ONETOUCH VERIO test strip SMARTSIG:Via Meter   tamsulosin (FLOMAX) 0.4 MG CAPS capsule Take 0.4 mg by mouth daily.   topiramate (TOPAMAX) 50 MG tablet Take 1 tablet (50 mg total) by mouth at bedtime and may repeat dose one time if needed.   torsemide (DEMADEX) 20 MG tablet Take 3 tablets (60 mg total) by mouth daily.   venlafaxine XR (EFFEXOR-XR) 75 MG 24 hr capsule Take 75 mg by mouth daily.   [DISCONTINUED] metolazone (ZAROXOLYN) 5 MG tablet Take 1 tablet (5 mg total) by mouth 2 (two) times a week. Take 30 minutes before Torsemide.on Tuesday and Friday     Allergies:   Penicillin g sodium   Social History   Socioeconomic History   Marital status: Married    Spouse name: [name]  Number of children: Not on file   Years of education: Not on file   Highest education level: Not on file  Occupational History   Not on file  Tobacco Use   Smoking status: Former    Packs/day: 1.00    Years: 30.00    Pack years: 30.00    Types: Cigars, Cigarettes    Quit date:  08/02/1992    Years since quitting: 29.5   Smokeless tobacco: Never  Vaping Use   Vaping Use: Never used  Substance and Sexual Activity   Alcohol use: Yes    Alcohol/week: 0.0 standard drinks    Comment: 1 beer a night.    Drug use: No   Sexual activity: Not on file  Other Topics Concern   Not on file  Social History Narrative   Lives w wife   R handed  Caffeine: 1 C of coffee a day   Social Determinants of Radio broadcast assistant Strain: Not on file  Food Insecurity: Not on file  Transportation Needs: Not on file  Physical Activity: Not on file  Stress: Not on file  Social Connections: Not on file     Family History: The patient's family history includes CVA in his father; Cancer in his mother; Diabetes Mellitus II in his mother; Heart failure in his father; Hypertension in his mother and sister. There is no history of Colon cancer.  ROS:   Please see the history of present illness.    Creatinine increased by 50% after metolazone was started in March.  He was swollen and short of breath at that time.  That is all cleared after starting metolazone.  All other systems reviewed and are negative.  EKGs/Labs/Other Studies Reviewed:    The following studies were reviewed today: No new imaging data. Laboratory data by Dr. Forde Dandy 2 weeks ago revealed a creatinine of 1.4 and potassium of 4.0.  EKG:  EKG an EKG was not repeated today.  Recent Labs: 11/16/2021: Magnesium 1.7; NT-Pro BNP 510 11/25/2021: TSH 1.720 01/27/2022: ALT 22; BUN 28; Creatinine, Ser 1.39; Hemoglobin 11.1; Platelets 454; Potassium 4.0; Sodium 137  Recent Lipid Panel No results found for: CHOL, TRIG, HDL, CHOLHDL, VLDL, LDLCALC, LDLDIRECT  Physical Exam:    VS:  BP (!) 100/58   Pulse 75   Ht 5' 11.75" (1.822 m)   Wt 273 lb (123.8 kg)   SpO2 97%   BMI 37.28 kg/m     Wt Readings from Last 3 Encounters:  02/10/22 273 lb (123.8 kg)  01/27/22 279 lb 6.4 oz (126.7 kg)  12/02/21 281 lb 12.8 oz (127.8  kg)     GEN: Morbid obesity. No acute distress HEENT: Normal NECK: No JVD. LYMPHATICS: No lymphadenopathy CARDIAC: No murmur. irregularly irregular RR with no gallop, or edema. VASCULAR:  Normal Pulses. No bruits. RESPIRATORY:  Clear to auscultation without rales, wheezing or rhonchi  ABDOMEN: Soft, non-tender, non-distended, No pulsatile mass, MUSCULOSKELETAL: No deformity  SKIN: Warm and dry NEUROLOGIC:  Alert and oriented x 3 PSYCHIATRIC:  Normal affect   ASSESSMENT:    1. Paroxysmal atrial fibrillation (HCC)   2. Chronic diastolic CHF (congestive heart failure) ([place])   3. Morbid obesity ([place])   4. Essential hypertension   5. Obstructive sleep apnea   6. Hyperlipidemia, unspecified hyperlipidemia type   7. Chronic anticoagulation   8. Acute renal failure superimposed on stage 3 chronic kidney disease, unspecified acute renal failure type, unspecified whether stage 3a or 3b CKD ([place])    PLAN:    In order of problems listed above:  The rate is controlled on metoprolol.  No change. In March he was seen by Dr. Harriet Masson, and metolazone was added because of clinical volume overload.  He was also having shortness of breath.  Lower extremity edema and breathing have improved on Demadex 60 mg daily and metolazone 5 mg twice weekly.  However on this regimen he has felt washed out and tired. Needs to lose weight Blood pressure is low normal.  This is likely related to volume contraction.  We will decrease metolazone to 2.5 mg on Tuesday and Friday to be taken the same as before (30 minutes prior to torsemide). Refuses CPAP Continue Lipitor 80 mg/day Eliquis therapy 5 mg twice daily should be continued. Decrease intensity of metolazone as outlined above.  I think this may help him to  feel stronger, give a little higher blood pressure, and hopefully still maintain volume control.  Basic metabolic panel in 1 week.  Follow-up in 6 to 9 months.   Medication Adjustments/Labs and Tests  Ordered: Current medicines are reviewed at length with the patient today.  Concerns regarding medicines are outlined above.  Orders Placed This Encounter  Procedures   Basic metabolic panel   Meds ordered this encounter  Medications   metolazone (ZAROXOLYN) 2.5 MG tablet    Sig: Take 1 tablet (2.5 mg total) by mouth daily.    Dispense:  90 tablet    Refill:  3    Patient Instructions  Medication Instructions:  Your physician has recommended you make the following change in your medication:   1) CHANGE Metolazone to 2.'5mg'$  twice a week on Tuesdays and Fridays (take 30 minutes before Torsemide.)  *If you need a refill on your cardiac medications before your next appointment, please call your pharmacy*  Lab Work: In 2-3 weeks: BMET If you have labs (blood work) drawn today and your tests are completely normal, you will receive your results only by: [place] (if you have MyChart) OR A paper copy in the mail If you have any lab test that is abnormal or we need to change your treatment, we will call you to review the results.  Testing/Procedures: NONE  Follow-Up: At [facility], you and your health needs are our priority.  As part of our continuing mission to provide you with exceptional heart care, we have created designated Provider Care Teams.  These Care Teams include your primary Cardiologist (physician) and Advanced Practice Providers (APPs -  Physician Assistants and Nurse Practitioners) who all work together to provide you with the care you need, when you need it.  Your next appointment:   6 month(s)  The format for your next appointment:   In Person  Provider:   [name], MD {  Other Instructions If you have shortness of breath or weight gain (3 lbs overnight or 5 lbs in a week) call us and let us know. Our number is [phone].  Important Information About Sugar         Signed, [name], MD  02/10/2022 12:42 PM    [facility]

## 2022-02-10 ENCOUNTER — Ambulatory Visit: Payer: Medicare HMO | Admitting: Interventional Cardiology

## 2022-02-10 ENCOUNTER — Encounter: Payer: Self-pay | Admitting: Interventional Cardiology

## 2022-02-10 VITALS — BP 100/58 | HR 75 | Ht 71.75 in | Wt 273.0 lb

## 2022-02-10 DIAGNOSIS — E785 Hyperlipidemia, unspecified: Secondary | ICD-10-CM

## 2022-02-10 DIAGNOSIS — G4733 Obstructive sleep apnea (adult) (pediatric): Secondary | ICD-10-CM | POA: Diagnosis not present

## 2022-02-10 DIAGNOSIS — Z7901 Long term (current) use of anticoagulants: Secondary | ICD-10-CM

## 2022-02-10 DIAGNOSIS — I10 Essential (primary) hypertension: Secondary | ICD-10-CM

## 2022-02-10 DIAGNOSIS — N179 Acute kidney failure, unspecified: Secondary | ICD-10-CM | POA: Diagnosis not present

## 2022-02-10 DIAGNOSIS — I5032 Chronic diastolic (congestive) heart failure: Secondary | ICD-10-CM | POA: Diagnosis not present

## 2022-02-10 DIAGNOSIS — I480 Paroxysmal atrial fibrillation: Secondary | ICD-10-CM

## 2022-02-10 DIAGNOSIS — N183 Chronic kidney disease, stage 3 unspecified: Secondary | ICD-10-CM

## 2022-02-10 MED ORDER — METOLAZONE 2.5 MG PO TABS: 2.5000 mg | ORAL_TABLET | Freq: Every day | ORAL | 3 refills | Status: DC

## 2022-02-10 NOTE — Patient Instructions (Signed)
Medication Instructions:  Your physician has recommended you make the following change in your medication:   1) CHANGE Metolazone to 2.'5mg'$  twice a week on Tuesdays and Fridays (take 30 minutes before Torsemide.)  *If you need a refill on your cardiac medications before your next appointment, please call your pharmacy*  Lab Work: In 2-3 weeks: BMET If you have labs (blood work) drawn today and your tests are completely normal, you will receive your results only by: [place] (if you have MyChart) OR A paper copy in the mail If you have any lab test that is abnormal or we need to change your treatment, we will call you to review the results.  Testing/Procedures: NONE  Follow-Up: At [facility], you and your health needs are our priority.  As part of our continuing mission to provide you with exceptional heart care, we have created designated Provider Care Teams.  These Care Teams include your primary Cardiologist (physician) and Advanced Practice Providers (APPs -  Physician Assistants and Nurse Practitioners) who all work together to provide you with the care you need, when you need it.  Your next appointment:   6 month(s)  The format for your next appointment:   In Person  Provider:   [name], MD {  Other Instructions If you have shortness of breath or weight gain (3 lbs overnight or 5 lbs in a week) call us and let us know. Our number is [phone].  Important Information About Sugar

## 2022-02-12 ENCOUNTER — Encounter: Payer: Self-pay | Admitting: Dermatology

## 2022-02-12 NOTE — Progress Notes (Signed)
   Follow-Up Visit   Subjective  [name] is a 81 y.o. male who presents for the following: Follow-up (Personal history of bcc and scc follow up on bcc scalp/neck treated 10/21/21).  Follow-up exam, several new crusted spots Location:  Duration:  Quality:  Associated Signs/Symptoms: Modifying Factors:  Severity:  Timing: Context:   Objective  Well appearing patient in no apparent distress; mood and affect are within normal limits. Right Temple sup Eroded 1 cm crust suggestive of carcinoma.  There are several other typical carcinomas but for uncertain reasons patient informed me that he only wants me to take care of this 1 spot at this visit.     Mid Parietal Scalp Per patient only one biopsy today so we will defer treatment today           A focused examination was performed including head and neck.. Relevant physical exam findings are noted in the Assessment and Plan.   Assessment & Plan    Neoplasm of uncertain behavior of skin Right Temple sup  Skin / nail biopsy Type of biopsy: tangential   Informed consent: discussed and consent obtained   Timeout: patient name, date of birth, surgical site, and procedure verified   Anesthesia: the lesion was anesthetized in a standard fashion   Anesthetic:  1% lidocaine w/ epinephrine 1-100,000 local infiltration Instrument used: flexible razor blade   Hemostasis achieved with: aluminum chloride and electrodesiccation   Outcome: patient tolerated procedure well   Post-procedure details: wound care instructions given    Specimen 2 - Surgical pathology Differential Diagnosis: bcc scc  Check Margins: No  AK (actinic keratosis) Mid Parietal Scalp  Defer any treatment today per patient       I, Lavonna Monarch, MD, have reviewed all documentation for this visit.  The documentation on 02/12/22 for the exam, diagnosis, procedures, and orders are all accurate and complete.

## 2022-02-16 ENCOUNTER — Emergency Department (HOSPITAL_BASED_OUTPATIENT_CLINIC_OR_DEPARTMENT_OTHER): Payer: Medicare HMO | Admitting: Radiology

## 2022-02-16 ENCOUNTER — Encounter (HOSPITAL_BASED_OUTPATIENT_CLINIC_OR_DEPARTMENT_OTHER): Payer: Self-pay | Admitting: Emergency Medicine

## 2022-02-16 ENCOUNTER — Emergency Department (HOSPITAL_BASED_OUTPATIENT_CLINIC_OR_DEPARTMENT_OTHER)
Admission: EM | Admit: 2022-02-16 | Discharge: 2022-02-17 | Disposition: A | Discharge status: Home / Self Care | Payer: Medicare HMO | Attending: Emergency Medicine | Admitting: Emergency Medicine

## 2022-02-16 ENCOUNTER — Other Ambulatory Visit: Payer: Self-pay

## 2022-02-16 DIAGNOSIS — Z79899 Other long term (current) drug therapy: Secondary | ICD-10-CM | POA: Diagnosis not present

## 2022-02-16 DIAGNOSIS — I110 Hypertensive heart disease with heart failure: Secondary | ICD-10-CM | POA: Diagnosis not present

## 2022-02-16 DIAGNOSIS — E876 Hypokalemia: Secondary | ICD-10-CM | POA: Diagnosis not present

## 2022-02-16 DIAGNOSIS — E1140 Type 2 diabetes mellitus with diabetic neuropathy, unspecified: Secondary | ICD-10-CM | POA: Diagnosis not present

## 2022-02-16 DIAGNOSIS — Z85828 Personal history of other malignant neoplasm of skin: Secondary | ICD-10-CM | POA: Insufficient documentation

## 2022-02-16 DIAGNOSIS — K5909 Other constipation: Secondary | ICD-10-CM | POA: Diagnosis not present

## 2022-02-16 DIAGNOSIS — I480 Paroxysmal atrial fibrillation: Secondary | ICD-10-CM | POA: Diagnosis not present

## 2022-02-16 DIAGNOSIS — K5900 Constipation, unspecified: Secondary | ICD-10-CM | POA: Diagnosis not present

## 2022-02-16 DIAGNOSIS — Z794 Long term (current) use of insulin: Secondary | ICD-10-CM | POA: Insufficient documentation

## 2022-02-16 DIAGNOSIS — Z7901 Long term (current) use of anticoagulants: Secondary | ICD-10-CM | POA: Diagnosis not present

## 2022-02-16 DIAGNOSIS — Z7984 Long term (current) use of oral hypoglycemic drugs: Secondary | ICD-10-CM | POA: Diagnosis not present

## 2022-02-16 DIAGNOSIS — I5032 Chronic diastolic (congestive) heart failure: Secondary | ICD-10-CM | POA: Diagnosis not present

## 2022-02-16 LAB — CBG MONITORING, ED: Glucose-Capillary: 89 mg/dL (ref 70–99)

## 2022-02-16 NOTE — ED Triage Notes (Signed)
Pt via pov from home with constipation; states he has not had a bowel movement in 3 weeks. Pt reports that he has had periodic constipation for the last 3 months, usually responds to laxatives. This time, he has been taking metamucil + laxatives for more than 10 days (intermittently) with no bowel movement. Pt saw PA last week, who recommended that he have an enema, but did not do so. Pt alert & oriented, nad noted.

## 2022-02-17 ENCOUNTER — Emergency Department (HOSPITAL_BASED_OUTPATIENT_CLINIC_OR_DEPARTMENT_OTHER): Payer: Medicare HMO

## 2022-02-17 DIAGNOSIS — I700 Atherosclerosis of aorta: Secondary | ICD-10-CM | POA: Diagnosis not present

## 2022-02-17 DIAGNOSIS — K5900 Constipation, unspecified: Secondary | ICD-10-CM | POA: Diagnosis not present

## 2022-02-17 LAB — CBC WITH DIFFERENTIAL/PLATELET
Abs Immature Granulocytes: 0.02 10*3/uL (ref 0.00–0.07)
Basophils Absolute: 0 10*3/uL (ref 0.0–0.1)
Basophils Relative: 1 %
Eosinophils Absolute: 0 10*3/uL (ref 0.0–0.5)
Eosinophils Relative: 1 %
HCT: 30.8 % — ABNORMAL LOW (ref 39.0–52.0)
Hemoglobin: 11.3 g/dL — ABNORMAL LOW (ref 13.0–17.0)
Immature Granulocytes: 0 %
Lymphocytes Relative: 13 %
Lymphs Abs: 0.8 10*3/uL (ref 0.7–4.0)
MCH: 41.7 pg — ABNORMAL HIGH (ref 26.0–34.0)
MCHC: 36.7 g/dL — ABNORMAL HIGH (ref 30.0–36.0)
MCV: 113.7 fL — ABNORMAL HIGH (ref 80.0–100.0)
Monocytes Absolute: 0.5 10*3/uL (ref 0.1–1.0)
Monocytes Relative: 7 %
Neutro Abs: 4.9 10*3/uL (ref 1.7–7.7)
Neutrophils Relative %: 78 %
Platelets: 443 10*3/uL — ABNORMAL HIGH (ref 150–400)
RBC: 2.71 MIL/uL — ABNORMAL LOW (ref 4.22–5.81)
RDW: 17.6 % — ABNORMAL HIGH (ref 11.5–15.5)
WBC: 6.3 10*3/uL (ref 4.0–10.5)
nRBC: 0.3 % — ABNORMAL HIGH (ref 0.0–0.2)

## 2022-02-17 LAB — COMPREHENSIVE METABOLIC PANEL
ALT: 17 U/L (ref 0–44)
AST: 23 U/L (ref 15–41)
Albumin: 3.8 g/dL (ref 3.5–5.0)
Alkaline Phosphatase: 93 U/L (ref 38–126)
Anion gap: 10 (ref 5–15)
BUN: 40 mg/dL — ABNORMAL HIGH (ref 8–23)
CO2: 35 mmol/L — ABNORMAL HIGH (ref 22–32)
Calcium: 10.1 mg/dL (ref 8.9–10.3)
Chloride: 92 mmol/L — ABNORMAL LOW (ref 98–111)
Creatinine, Ser: 1.6 mg/dL — ABNORMAL HIGH (ref 0.61–1.24)
GFR, Estimated: 44 mL/min — ABNORMAL LOW (ref >60)
Glucose, Bld: 88 mg/dL (ref 70–99)
Potassium: 2.7 mmol/L — CL (ref 3.5–5.1)
Sodium: 137 mmol/L (ref 135–145)
Total Bilirubin: 1 mg/dL (ref 0.3–1.2)
Total Protein: 6.6 g/dL (ref 6.5–8.1)

## 2022-02-17 MED ORDER — IOHEXOL 300 MG/ML  SOLN
100.0000 mL | Freq: Once | INTRAMUSCULAR | Status: AC | PRN
  Administered 2022-02-17: 100 mL via INTRAVENOUS

## 2022-02-17 MED ORDER — POLYETHYLENE GLYCOL 3350 17 GM/SCOOP PO POWD: ORAL | 0 refills | Status: AC

## 2022-02-17 MED ORDER — POTASSIUM CHLORIDE ER 10 MEQ PO TBCR: 20.0000 meq | EXTENDED_RELEASE_TABLET | Freq: Every day | ORAL | 0 refills | Status: DC

## 2022-02-17 NOTE — ED Notes (Signed)
Patient transported to CT 

## 2022-02-17 NOTE — ED Provider Notes (Signed)
[facility] EMERGENCY DEPT Provider Note  CSN: [id number] Arrival date & time: 02/16/22 2236  Chief Complaint(s) Constipation  HPI [name] is a 81 y.o. male with a past medical history listed below including A-fib on anticoagulation, heart failure on torsemide   Constipation Severity:  Severe Time since last bowel movement:  3 weeks Timing:  Constant Progression:  Unchanged Chronicity:  New Context: dehydration   Stool description:  None produced Relieved by:  Nothing Worsened by:  Nothing Ineffective treatments:  Laxatives, stool softeners and fiber Associated symptoms: abdominal pain   Associated symptoms: no fever, no hematochezia, no nausea and no urinary retention   Abdominal pain:    Quality: cramping     Timing:  Intermittent   Progression:  Waxing and waning   Past Medical History Past Medical History:  Diagnosis Date   A-fib ([place])    Anxiety    Arthritis    Basal cell carcinoma 07/18/1991   Left nasal brdige (MOHS)   Basal cell carcinoma 01/23/1992   lower right back-(CX35FU)   Basal cell carcinoma 05/20/2003   sup-left back (CX35FU)   Basal cell carcinoma 07/28/2011   post lower neck   Basal cell carcinoma 08/20/2008   right sideburn(MOHS), sup-Left upper back (CX35FU), sup-mid back (CX35FU), nod-Right lower back )CX35FU), nod-right upperarm (CX35FU)   Basal cell carcinoma 06/08/2016   sup-Left upper back (CX35FU), mid back (CX35FU), right lower back (CX35FU), nod-Right upperarm (CX35FU)   Cancer (HCC)    skin - basil cell   Depression    Diabetes mellitus without complication (HCC)    Dysrhythmia    a-fib   GERD (gastroesophageal reflux disease)    Hyperlipidemia    Hypertension    Neuropathy    Obesity    Paroxysmal atrial fibrillation ([place])    Peripheral vascular disease ([place])    diabetic neuropathy in both feet   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 (in situ)   Sleep apnea    uses C-pap machine   Squamous cell carcinoma of skin 02/11/2013   in situ-Right temple (CX35FU)   Squamous cell carcinoma of skin 08/20/2008   in situ- front scalp (CX35FU)   Squamous cell carcinoma of skin 06/08/2016   in situ-front scalp (CX35FU)   Squamous cell carcinoma of skin 02/05/2019   in situ-right sideburn-sup (CX35FU), in situ-right sideburn,inf (CX35FU)   Stroke ([place])    08/09/2017   Superficial basal cell carcinoma (BCC) 01/20/2021   Scalp   Patient Active Problem List   Diagnosis Date Noted   Dysarthria 10/14/2018   Atrial fibrillation with RVR ([place]) 10/13/2018   Nodule of upper lobe of right lung 01/31/2018   Diabetic retinopathy ([place]) 10/06/2017   Diabetic polyneuropathy ([place]) 10/06/2017   Morbid obesity ([place]) 09/25/2017   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   Coagulopathy ([place]) 09/25/2017   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   HLD (hyperlipidemia) 08/09/2017   Anxiety 08/09/2017   Chronic diastolic CHF (congestive heart failure) ([place]) 08/09/2017   OA (osteoarthritis) of knee 11/02/2015   Hx of adenomatous colonic polyps 11/03/2014   Paroxysmal atrial fibrillation ([place]) 11/03/2014   Long term current use of  anticoagulant therapy 11/03/2014   Obesity (BMI 30-39.9) 07/15/2014   HTN (hypertension) 07/15/2014   Chronic anticoagulation 06/20/2014   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  Authorizing Provider  polyethylene glycol powder (MIRALAX) 17 GM/SCOOP powder Please take 6 capfuls of MiraLAX in a 16 oz bottle of Gatorade over 2-4 hour period. The following day take 3 capfuls. On day 3 start taking 1 capful 3 times a day. Slowly cut back as needed until you have normal bowel movements. 02/17/22  Yes Arn Mcomber, Grayce Sessions, MD  potassium chloride (KLOR-CON) 10 MEQ tablet Take 2 tablets (20 mEq total) by mouth daily for 14 days. 02/17/22 03/03/22 Yes Dare Spillman, Grayce Sessions, MD  ALPRAZolam Duanne Moron) 0.5 MG tablet Take 0.5 mg by mouth 2 (two) times daily.    [provider]  apixaban (ELIQUIS) 5 MG TABS tablet Take 1 tablet by mouth twice daily 08/23/21   Allred, Jeneen Rinks, MD  atorvastatin (LIPITOR) 80 MG tablet Take 1 tablet (80 mg total) by mouth daily at 6 PM. 08/11/17   Mariel Aloe, MD  ergocalciferol (VITAMIN D2) 50000 units capsule Take 50,000 Units by mouth once a week.     [provider]  glucose blood (ONETOUCH ULTRA) test strip USE 1 STRIP TO CHECK GLUCOSE 4 TIMES DAILY 02/18/20   [provider]  hydroxyurea (HYDREA) 500 MG capsule TAKE 2 CAPSULES BY MOUTH IN THE MORNING AND 1 IN THE EVENING 06/28/21   Magrinat, Virgie Dad, MD  insulin NPH-regular Human (NOVOLIN 70/30) (70-30) 100 UNIT/ML injection Inject 44 Units into the skin 2 (two) times daily with a meal.    [provider]  levothyroxine (SYNTHROID) 75 MCG tablet TAKE 1 TABLET BY MOUTH IN THE MORNING ON AN EMPTY STOMACH 30 MINUTES BEFORE MEAL(S) AND OTHER MEDS 03/31/20   [provider]  metFORMIN (GLUCOPHAGE) 1000 MG tablet Take 500 mg by mouth 2 (two) times daily. 08/27/18   [provider]  metolazone (ZAROXOLYN) 2.5 MG tablet Take 1 tablet (2.5 mg total) by mouth daily. 02/10/22   Belva Crome, MD  metoprolol tartrate (LOPRESSOR) 50 MG tablet Take 50 mg by mouth 2 (two) times daily. 05/13/19   [provider]  Multiple Vitamin (MULTIVITAMIN WITH MINERALS) TABS  tablet Take 1 tablet by mouth daily.    [provider]  mupirocin ointment (BACTROBAN) 2 % Apply 1 application topically 2 (two) times daily. 10/21/21   Lavonna Monarch, MD  omeprazole (PRILOSEC) 20 MG capsule Take 20 mg by mouth daily.    [provider]  [facility] VERIO test strip SMARTSIG:Via Meter 07/02/20   [provider]  tamsulosin (FLOMAX) 0.4 MG CAPS capsule Take 0.4 mg by mouth daily. 12/06/20   [provider]  topiramate (TOPAMAX) 50 MG tablet Take 1 tablet (50 mg total) by mouth at bedtime and may repeat dose one time if needed. 01/27/22   Garvin Fila, MD  torsemide (DEMADEX) 20 MG tablet Take 3 tablets (60 mg total) by mouth daily. 05/07/21   Belva Crome, MD  venlafaxine XR (EFFEXOR-XR) 75 MG 24 hr capsule Take 75 mg by mouth daily.    [provider]  Allergies Penicillin g sodium  Review of Systems Review of Systems  Constitutional:  Negative for fever.  Gastrointestinal:  Positive for abdominal pain and constipation. Negative for hematochezia and nausea.   As noted in HPI  Physical Exam Vital Signs  I have reviewed the triage vital signs BP 140/76   Pulse 68   Resp 18   Ht 6' (1.829 m)   Wt 123.8 kg   SpO2 100%   BMI 37.03 kg/m   Physical Exam Vitals reviewed.  Constitutional:      General: He is not in acute distress.    Appearance: He is well-developed. He is not diaphoretic.  HENT:     Head: Normocephalic and atraumatic.     Right Ear: External ear normal.     Left Ear: External ear normal.     Nose: Nose normal.     Mouth/Throat:     Mouth: Mucous membranes are moist.  Eyes:     General: No scleral icterus.    Conjunctiva/sclera: Conjunctivae normal.  Neck:     Trachea: Phonation normal.  Cardiovascular:     Rate and Rhythm: Normal rate and regular rhythm.  Pulmonary:      Effort: Pulmonary effort is normal. No respiratory distress.     Breath sounds: No stridor.  Abdominal:     General: There is no distension.     Tenderness: There is abdominal tenderness (mild) in the left lower quadrant. There is no guarding or rebound.  Musculoskeletal:        General: Normal range of motion.     Cervical back: Normal range of motion.  Neurological:     Mental Status: He is alert and oriented to person, place, and time.  Psychiatric:        Behavior: Behavior normal.     ED Results and Treatments Labs (all labs ordered are listed, but only abnormal results are displayed) Labs Reviewed  CBC WITH DIFFERENTIAL/PLATELET - Abnormal; Notable for the following components:      Result Value   RBC 2.71 (*)    Hemoglobin 11.3 (*)    HCT 30.8 (*)    MCV 113.7 (*)    MCH 41.7 (*)    MCHC 36.7 (*)    RDW 17.6 (*)    Platelets 443 (*)    nRBC 0.3 (*)    All other components within normal limits  COMPREHENSIVE METABOLIC PANEL - Abnormal; Notable for the following components:   Potassium 2.7 (*)    Chloride 92 (*)    CO2 35 (*)    BUN 40 (*)    Creatinine, Ser 1.60 (*)    GFR, Estimated 44 (*)    All other components within normal limits  CBG MONITORING, ED                                                                                                                         EKG  EKG Interpretation  Date/Time:  Ventricular Rate:    PR Interval:    QRS Duration:   QT Interval:    QTC Calculation:   R Axis:     Text Interpretation:         Radiology CT ABDOMEN PELVIS W CONTRAST  Result Date: 02/17/2022 CLINICAL DATA:  Abdominal pain.  Concern for diverticulitis. EXAM: CT ABDOMEN AND PELVIS WITH CONTRAST TECHNIQUE: Multidetector CT imaging of the abdomen and pelvis was performed using the standard protocol following bolus administration of intravenous contrast. RADIATION DOSE REDUCTION: This exam was performed according to the departmental dose-optimization  program which includes automated exposure control, adjustment of the mA and/or kV according to patient size and/or use of iterative reconstruction technique. CONTRAST:  165m OMNIPAQUE IOHEXOL 300 MG/ML  SOLN COMPARISON:  Chest CT dated 02/28/2018. FINDINGS: Lower chest: There is a faint 4 mm right lung base nodule (7/4), present on the prior CT of 2019. The visualized lung bases are otherwise clear. There is coronary vascular calcification. No intra-abdominal free air or free fluid. Hepatobiliary: Mild fatty liver. No intrahepatic biliary ductal dilatation. The gallbladder is unremarkable. Pancreas: Unremarkable. No pancreatic ductal dilatation or surrounding inflammatory changes. Spleen: Normal in size without focal abnormality. Adrenals/Urinary Tract: The adrenal glands are unremarkable. There is no hydronephrosis on either side. There is a 1.5 cm right renal inferior pole cyst as well as additional subcentimeter hypodense lesions which are not characterized. There is symmetric enhancement and excretion of contrast by both kidneys. The visualized ureters and the urinary bladder appear unremarkable. Stomach/Bowel: There is large amount of stool throughout the colon. Several small scattered sigmoid diverticula without active inflammatory changes. There is a small hiatal hernia. There is no bowel obstruction or active inflammation. The appendix is normal. Vascular/Lymphatic: Moderate aortoiliac atherosclerotic disease. The IVC is unremarkable. No portal venous gas. There is no adenopathy. Reproductive: The prostate gland is mildly enlarged measuring approximately 6 cm in transverse axial diameter. The seminal vesicles are symmetric. Other: Small fat containing umbilical hernia.0 Musculoskeletal: Osteopenia with degenerative changes of the spine. No acute osseous pathology. Partially visualized somewhat loculated fluid in the subcutaneous soft tissues of the posterolateral left hip measuring approximately 6 x 2 cm.  IMPRESSION: 1. No acute intra-abdominal or pelvic pathology. 2. Constipation.  No bowel obstruction. Normal appendix. 3. Small scattered sigmoid diverticula without active inflammatory changes. 4. A 4 mm right lower lobe nodule, present on the study of 2019. No follow-up needed if patient is low-risk.This recommendation follows the consensus statement: Guidelines for Management of Incidental Pulmonary Nodules Detected on CT Images: From the Fleischner Society 2017; Radiology 2017; 284:228-243. 5. Aortic Atherosclerosis (ICD10-I70.0). Electronically Signed   By: AAnner CreteM.D.   On: 02/17/2022 01:01   DG ABD ACUTE 2+V W 1V CHEST  Result Date: 02/16/2022 CLINICAL DATA:  Constipation. EXAM: DG ABDOMEN ACUTE WITH 1 VIEW CHEST COMPARISON:  Chest radiograph dated 10/13/2018. FINDINGS: The lungs are clear. There is no pleural effusion pneumothorax. The cardiac silhouette is within normal limits. There is large amount of stool throughout the colon. No bowel dilatation or evidence of obstruction. No free air or radiopaque calculi. Degenerative changes of spine. No acute osseous pathology. IMPRESSION: 1. No acute cardiopulmonary process. 2. Constipation. No bowel obstruction. Electronically Signed   By: AAnner CreteM.D.   On: 02/16/2022 23:31    Pertinent labs & imaging results that were available during my care of the patient were reviewed by me and considered in my medical decision making (see MDM for details).  Medications Ordered in ED Medications  iohexol (OMNIPAQUE) 300 MG/ML solution 100 mL (100 mLs Intravenous Contrast Given 02/17/22 0050)                                                                                                                                     Procedures Fecal disimpaction  Date/Time: 02/17/2022 2:39 AM  Performed by: Fatima Blank, MD Authorized by: Fatima Blank, MD  Consent: Verbal consent obtained. Consent given by: patient Patient  understanding: patient states understanding of the procedure being performed Patient identity confirmed: verbally with patient Time out: Immediately prior to procedure a "time out" was called to verify the correct patient, procedure, equipment, support staff and site/side marked as required. Local anesthesia used: no  Anesthesia: Local anesthesia used: no  Sedation: Patient sedated: no  Patient tolerance: patient tolerated the procedure well with no immediate complications     (including critical care time)  Medical Decision Making / ED Course    Complexity of Problem:  Co-morbidities/SDOH that complicate the patient evaluation/care: Noted in HPI  Additional history obtained: Colonoscopy 2016 notable for mild diverticulosis and a single polyp that was benign.  Patient's presenting problem/concern, DDX, and MDM listed below: Abdominal discomfort and constipation Mild LLq discomfort Will assess for small bowel obstruction, diverticulitis.  Hospitalization Considered:  Yes if evidence of bowel obstruction    Complexity of Data:    Laboratory Tests ordered listed below with my independent interpretation: CBC without leukocytosis.  Stable hemoglobin. Metabolic panel notable for hypokalemia at 2.7.,  Mild AKI no significant electrolyte derangements.   Imaging Studies ordered listed below with my independent interpretation: Acute abdominal series without obstructive bowel pattern.  It was notable for constipation. CT scan without evidence of diverticulitis or stercoral colitis.     ED Course:    Assessment, Add'l Intervention, and Reassessment: Constipation Patient had mild fecal impaction that was manually removed. Not sufficient for enema in the ED. We will recommend high-dose MiraLAX.  Hypokalemia Likely associated to diuretic. Patient prescribed K supplements Recommended close follow-up with PCP in 1 week to recheck labs.  Final Clinical Impression(s) / ED  Diagnoses Final diagnoses:  Other constipation  Low serum potassium   The patient appears reasonably screened and/or stabilized for discharge and I doubt any other medical condition or other [facility] requiring further screening, evaluation, or treatment in the ED at this time prior to discharge. Safe for discharge with strict return precautions.  Disposition: Discharge  Condition: Good  I have discussed the results, Dx and Tx plan with the patient/family who expressed understanding and agree(s) with the plan. Discharge instructions discussed at length. The patient/family was given strict return precautions who verbalized understanding of the instructions. No further questions at time of discharge.    ED Discharge Orders          Ordered    polyethylene glycol powder (MIRALAX) 17 GM/SCOOP powder  02/17/22 0229    potassium chloride (KLOR-CON) 10 MEQ tablet  Daily        02/17/22 0229            Follow Up: [name], MD [place] [address] [phone]  Call  to schedule an appointment for close follow up           This chart was dictated using voice recognition software.  Despite best efforts to proofread,  errors can occur which can change the documentation meaning.    Fatima Blank, MD 02/17/22 [phone]

## 2022-02-17 NOTE — ED Notes (Signed)
Pt family request pt BG to be checked. Pt have a hx of constipation // BG 89. OJ given to pt

## 2022-02-17 NOTE — ED Notes (Addendum)
MD made aware of K 2.7 // pending orders

## 2022-02-17 NOTE — Discharge Instructions (Addendum)
Please take 6 capfuls of MiraLAX in a 16 oz bottle of Gatorade over 2-4 hour period. The following day take 3 capfuls. On day 3 start taking 1 capful 3 times a day. Slowly cut back as needed until you have normal bowel movements.

## 2022-02-21 ENCOUNTER — Other Ambulatory Visit: Payer: Self-pay | Admitting: Internal Medicine

## 2022-02-21 DIAGNOSIS — I480 Paroxysmal atrial fibrillation: Secondary | ICD-10-CM

## 2022-02-21 NOTE — Telephone Encounter (Signed)
Eliquis '5mg'$  refill request received. Patient is 81 years old, weight-123.8kg, Crea-1.60 on 02/17/2022, Diagnosis-Afib, and last seen by Dr. Tamala Julian on 02/10/2022. Dose is appropriate based on dosing criteria. Will send in refill to requested pharmacy.

## 2022-02-28 ENCOUNTER — Other Ambulatory Visit: Payer: Self-pay

## 2022-02-28 ENCOUNTER — Telehealth: Payer: Self-pay | Admitting: Interventional Cardiology

## 2022-02-28 ENCOUNTER — Other Ambulatory Visit: Payer: Medicare HMO

## 2022-02-28 NOTE — Telephone Encounter (Signed)
Left message for patient to call back  

## 2022-02-28 NOTE — Telephone Encounter (Signed)
Pt c/o medication issue:  1. Name of Medication: metolazone (ZAROXOLYN) 2.5 MG tablet  2. How are you currently taking this medication (dosage and times per day)?   3. Are you having a reaction (difficulty breathing--STAT)?   4. What is your medication issue? Patient says that this pill does not have a breaking point in the pill to cut in half and says when he tries, it breaks in half. Requesting call back to find out how to take this.   Also requesting clarification as to the dosage.

## 2022-02-28 NOTE — Telephone Encounter (Signed)
*  STAT* If patient is at the pharmacy, call can be transferred to refill team.   1. Which medications need to be refilled? (please list name of each medication and dose if known) tamsulosin (FLOMAX) 0.4 MG CAPS capsule  2. Which pharmacy/location (including street and city if local pharmacy) is medication to be sent to? [place], [place]  3. Do they need a 30 day or 90 day supply? 20  Was told by pharmacy that this needs to be prescribed, but he says he wants to make sure he can get this.

## 2022-02-28 NOTE — Telephone Encounter (Signed)
Pt requesting refill on Flomax. Please address.

## 2022-03-01 ENCOUNTER — Other Ambulatory Visit: Payer: Medicare HMO

## 2022-03-07 DIAGNOSIS — Z794 Long term (current) use of insulin: Secondary | ICD-10-CM | POA: Diagnosis not present

## 2022-03-07 DIAGNOSIS — Z961 Presence of intraocular lens: Secondary | ICD-10-CM | POA: Diagnosis not present

## 2022-03-07 DIAGNOSIS — H35373 Puckering of macula, bilateral: Secondary | ICD-10-CM | POA: Diagnosis not present

## 2022-03-07 DIAGNOSIS — E113513 Type 2 diabetes mellitus with proliferative diabetic retinopathy with macular edema, bilateral: Secondary | ICD-10-CM | POA: Diagnosis not present

## 2022-03-07 DIAGNOSIS — H3589 Other specified retinal disorders: Secondary | ICD-10-CM | POA: Diagnosis not present

## 2022-03-08 ENCOUNTER — Encounter: Payer: Self-pay | Admitting: Interventional Cardiology

## 2022-03-10 ENCOUNTER — Telehealth: Payer: Self-pay | Admitting: Interventional Cardiology

## 2022-03-10 MED ORDER — METOLAZONE 2.5 MG PO TABS: 2.5000 mg | ORAL_TABLET | ORAL | 3 refills | Status: DC

## 2022-03-10 NOTE — Telephone Encounter (Signed)
Follow  Up:     Patient is returning a call from [place] from yesterday.

## 2022-03-10 NOTE — Telephone Encounter (Signed)
Returned call to patient.  Patient reports he only had a '5mg'$  tablet of metolazone and has not been able to cut it in half due to the pill being too small. He states he has not taken any metolazone for the last 3 weeks and had seen an increase in edema.  Patient also asked if Dr. Tamala Julian could refill his tamsulosin as he has also been out of this for 3 weeks. He also asked if he needs to continue taking potassium. Advised patient he would need to follow-up with his PCP for follow-up labs to check potassium level and to get refills/prescriptions for Tamsulosin and potassium supplement.  Patient verbalized understanding. Metolazone 2.'5mg'$  twice weekly sent to pharmacy of choice.

## 2022-03-18 MED ORDER — METOLAZONE 2.5 MG PO TABS: 2.5000 mg | ORAL_TABLET | ORAL | 3 refills | Status: DC

## 2022-03-18 NOTE — Addendum Note (Signed)
Addended by: Molli Barrows on: 03/18/2022 03:48 PM   Modules accepted: Orders

## 2022-03-18 NOTE — Telephone Encounter (Signed)
   Pt's wife is calling back to f/u pt's medication. She requesting to speak with RN again

## 2022-03-18 NOTE — Telephone Encounter (Signed)
Spoke with patient, he reports [facility] filled his metolazone but it was still the '5mg'$  tablets.  Sent new refill request for Metolazone 2.'5mg'$  tablet as prescribed. [place] and confirmed they did received the Rx for the 2.'5mg'$  tablet.  Patient verbalized understanding and thanked me for calling.

## 2022-03-23 ENCOUNTER — Telehealth: Payer: Self-pay | Admitting: Interventional Cardiology

## 2022-03-23 NOTE — Telephone Encounter (Signed)
Pt c/o medication issue:  1. Name of Medication:   metolazone (ZAROXOLYN) 2.5 MG tablet    torsemide (DEMADEX) 20 MG tablet    2. How are you currently taking this medication (dosage and times per day)?   3. Are you having a reaction (difficulty breathing--STAT)? No  4. What is your medication issue? Pt states that medications are causing him to urinate more than usual. Pt states that his urination is uncontrollable to where he tends to urinate on himself. Please advise

## 2022-03-23 NOTE — Telephone Encounter (Signed)
Returned call to patient, he reports he took Metolazone 2.'5mg'$  yesterday after not having any for the last 3-4 weeks. He states the metolazone combined with the torsemide made him urinate more frequently, he reports he urinated 3 times and wet himself each time.  Patient also reports the swelling in his legs/knees that have built up while he was not taking metolazone is still present, denies that swelling is any worse.  Informed patient that while it is unpleasant and inconvenient, metolazone and torsemide are diuretics and are supposed to remove excess fluid from the body through urination. The combination of these medication is more powerful than either alone. This is an expected and desired effect of the medication. Informed patient that because he just started taking metolazone again after having fluid build-up, it may take a few more doses to see improvement in swelling.  Patient verbalized understanding and states he will continue taking Metolazone as prescribed (on Tuesdays and Fridays) along with the torsemide and let us know if no improvement in swelling.   Patient expressed appreciation for the information provided.

## 2022-03-31 ENCOUNTER — Ambulatory Visit (INDEPENDENT_AMBULATORY_CARE_PROVIDER_SITE_OTHER): Payer: Medicare HMO | Admitting: Dermatology

## 2022-03-31 ENCOUNTER — Encounter: Payer: Self-pay | Admitting: Dermatology

## 2022-03-31 DIAGNOSIS — D485 Neoplasm of uncertain behavior of skin: Secondary | ICD-10-CM

## 2022-03-31 DIAGNOSIS — C4339 Malignant melanoma of other parts of face: Secondary | ICD-10-CM | POA: Diagnosis not present

## 2022-03-31 DIAGNOSIS — D0430 Carcinoma in situ of skin of unspecified part of face: Secondary | ICD-10-CM

## 2022-03-31 DIAGNOSIS — D0439 Carcinoma in situ of skin of other parts of face: Secondary | ICD-10-CM | POA: Diagnosis not present

## 2022-03-31 NOTE — Patient Instructions (Signed)

## 2022-04-05 DIAGNOSIS — K5900 Constipation, unspecified: Secondary | ICD-10-CM | POA: Diagnosis not present

## 2022-04-05 DIAGNOSIS — I130 Hypertensive heart and chronic kidney disease with heart failure and stage 1 through stage 4 chronic kidney disease, or unspecified chronic kidney disease: Secondary | ICD-10-CM | POA: Diagnosis not present

## 2022-04-05 DIAGNOSIS — N1831 Chronic kidney disease, stage 3a: Secondary | ICD-10-CM | POA: Diagnosis not present

## 2022-04-05 DIAGNOSIS — Z7901 Long term (current) use of anticoagulants: Secondary | ICD-10-CM | POA: Diagnosis not present

## 2022-04-05 DIAGNOSIS — I480 Paroxysmal atrial fibrillation: Secondary | ICD-10-CM | POA: Diagnosis not present

## 2022-04-05 DIAGNOSIS — I5032 Chronic diastolic (congestive) heart failure: Secondary | ICD-10-CM | POA: Diagnosis not present

## 2022-04-06 ENCOUNTER — Telehealth (INDEPENDENT_AMBULATORY_CARE_PROVIDER_SITE_OTHER): Payer: Medicare HMO | Admitting: Dermatology

## 2022-04-06 ENCOUNTER — Telehealth: Payer: Self-pay | Admitting: Dermatology

## 2022-04-07 ENCOUNTER — Telehealth: Payer: Self-pay | Admitting: Dermatology

## 2022-04-07 DIAGNOSIS — I480 Paroxysmal atrial fibrillation: Secondary | ICD-10-CM | POA: Diagnosis not present

## 2022-04-07 DIAGNOSIS — E1140 Type 2 diabetes mellitus with diabetic neuropathy, unspecified: Secondary | ICD-10-CM | POA: Diagnosis not present

## 2022-04-07 DIAGNOSIS — K592 Neurogenic bowel, not elsewhere classified: Secondary | ICD-10-CM | POA: Diagnosis not present

## 2022-04-07 DIAGNOSIS — N1831 Chronic kidney disease, stage 3a: Secondary | ICD-10-CM | POA: Diagnosis not present

## 2022-04-07 DIAGNOSIS — E785 Hyperlipidemia, unspecified: Secondary | ICD-10-CM | POA: Diagnosis not present

## 2022-04-07 DIAGNOSIS — E11319 Type 2 diabetes mellitus with unspecified diabetic retinopathy without macular edema: Secondary | ICD-10-CM | POA: Diagnosis not present

## 2022-04-07 DIAGNOSIS — G4733 Obstructive sleep apnea (adult) (pediatric): Secondary | ICD-10-CM | POA: Diagnosis not present

## 2022-04-07 DIAGNOSIS — D473 Essential (hemorrhagic) thrombocythemia: Secondary | ICD-10-CM | POA: Diagnosis not present

## 2022-04-07 DIAGNOSIS — I10 Essential (primary) hypertension: Secondary | ICD-10-CM | POA: Diagnosis not present

## 2022-04-07 DIAGNOSIS — I5032 Chronic diastolic (congestive) heart failure: Secondary | ICD-10-CM | POA: Diagnosis not present

## 2022-04-07 DIAGNOSIS — I700 Atherosclerosis of aorta: Secondary | ICD-10-CM | POA: Diagnosis not present

## 2022-04-07 DIAGNOSIS — R911 Solitary pulmonary nodule: Secondary | ICD-10-CM | POA: Diagnosis not present

## 2022-04-09 ENCOUNTER — Telehealth (INDEPENDENT_AMBULATORY_CARE_PROVIDER_SITE_OTHER): Payer: Medicare HMO | Admitting: Dermatology

## 2022-04-09 NOTE — Telephone Encounter (Signed)
  This is my fourth phone call to the patient to discuss his biopsy result including calling in the evenings as well as during the day and on the weekend.  I left a message on his answering machine telling him to contact my office next week to arrange to have Dr. Jarome Matin or Dr. Link Snuffer do surgery on the spot on his cheek.  The message stated that it was melanoma with a high cure rate.

## 2022-04-12 ENCOUNTER — Telehealth: Payer: Self-pay | Admitting: *Deleted

## 2022-04-12 NOTE — Telephone Encounter (Signed)
Information for patients melanoma referral sent to the skin surgery center. They will contact patient with his appointment time and date.

## 2022-04-23 ENCOUNTER — Encounter: Payer: Self-pay | Admitting: Dermatology

## 2022-04-23 NOTE — Progress Notes (Signed)
   Follow-Up Visit   Subjective  [name] is a 81 y.o. male who presents for the following: Procedure (Cis right sup temple).  Biopsy proven carcinoma in situ right temple, growth and spot on left cheek Location:  Duration:  Quality:  Associated Signs/Symptoms: Modifying Factors:  Severity:  Timing: Context:   Objective  Well appearing patient in no apparent distress; mood and affect are within normal limits. Right temple superior Lesion identified by nurse and Dr. Denna Haggard in room.   Left Buccal Cheek Pink waxy 6 mm papule    A focused examination was performed including head and neck.. Relevant physical exam findings are noted in the Assessment and Plan.   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  Destruction of lesion Complexity: simple   Destruction method: electrodesiccation and curettage   Informed consent: discussed and consent obtained   Timeout:  patient name, date of birth, surgical site, and procedure verified Anesthesia: the lesion was anesthetized in a standard fashion   Anesthetic:  1% lidocaine w/ epinephrine 1-100,000 local infiltration Curettage performed in three different directions: Yes   Curettage cycles:  3 Lesion length (cm):  1.6 Lesion width (cm):  1.6 Margin per side (cm):  0 Final wound size (cm):  1.6 Hemostasis achieved with:  ferric subsulfate Outcome: patient tolerated procedure well with no complications   Additional details:  Wound innoculated with 5 fluorouracil solution.  Neoplasm of uncertain behavior of skin Left Buccal Cheek  Skin / nail biopsy Type of biopsy: tangential   Informed consent: discussed and consent obtained   Timeout: patient name, date of birth, surgical site, and procedure verified   Anesthesia: the lesion was anesthetized in a standard fashion   Anesthetic:  1% lidocaine w/ epinephrine 1-100,000 local infiltration Instrument used: flexible  razor blade   Hemostasis achieved with: aluminum chloride and electrodesiccation   Outcome: patient tolerated procedure well   Post-procedure details: wound care instructions given    Specimen 1 - Surgical pathology Differential Diagnosis: pigmented bowens tx with bx  Check Margins: No      I, Lavonna Monarch, MD, have reviewed all documentation for this visit.  The documentation on 04/23/22 for the exam, diagnosis, procedures, and orders are all accurate and complete.

## 2022-04-27 ENCOUNTER — Telehealth: Payer: Self-pay | Admitting: Interventional Cardiology

## 2022-04-27 NOTE — Telephone Encounter (Signed)
Left message to callback on both numbers listed on chart.

## 2022-04-27 NOTE — Telephone Encounter (Signed)
Pt c/o swelling: STAT is pt has developed SOB within 24 hours  If swelling, where is the swelling located?  Terrible swelling in his lower leg and ankles- it is worse and it will not go away- he said something  have to be done  How much weight have you gained and in what time span?  He does not think that he have gained any weight  Have you gained 3 pounds in a day or 5 pounds in a week?   Do you have a log of your daily weights (if so, list)?   Are you currently taking a fluid pill? yes  Are you currently SOB? no  Have you traveled recently? nop

## 2022-04-29 NOTE — Telephone Encounter (Signed)
Attempted to contact patient on wife's phone per patient request, no answer. Left message to callback.

## 2022-04-29 NOTE — Telephone Encounter (Signed)
Pt called on his wife's phone stating his edema has gotten bad and his phone was acting up with Drue Dun, RN called back. He ask that you please call his wife's phone.

## 2022-05-01 NOTE — Telephone Encounter (Signed)
Message left for patient to contact the office.

## 2022-05-02 NOTE — Telephone Encounter (Signed)
Message left for patient to contact the office.

## 2022-05-02 NOTE — Telephone Encounter (Signed)
Message left

## 2022-05-02 NOTE — Telephone Encounter (Signed)
Left patient message

## 2022-05-02 NOTE — Telephone Encounter (Signed)
Message left for patient to contact office.

## 2022-05-02 NOTE — Telephone Encounter (Signed)
Message left that I will call patient back

## 2022-05-04 NOTE — Progress Notes (Unsigned)
[name] [address] [place]. [place] [address] [phone]       OFFICE FOLLOW-UP NOTE  [name] Date of Birth:  Sep 29, 1940 Medical Record Number:  [id number]   Reason for visit: Stroke follow-up [facility] provider: Dr. Leonie Man   No chief complaint on file.    HPI:  Stroke admission 07/2017 PS: [name] is a 81 y.o. male with a history of afib on Xarelto who has been having difficulty speaking since awakening this morning. He states that it seems worse at times, butthese episodes of worsening are only for a few seconds. He has also had two episodes of right sided numbness lasting a few seconds as well. 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 .LKW: 11/27 prior to bed. tpa given?: no, out of window.CT scan of the head showed acute small left frontal MCA territory nonhemorrhagic infarct and moderate changes of small vessel disease. 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 CT angiogram of the brain showed moderate stenosis of left M2 and proximal right posterior cerebral arteries.MRI scan of the brain confirmed a small foci of acute infarcts in the posterior left MCA territory involving posterior frontal and posterior parietal lobes likely emboli. Patient had known history of atrial fibrillation and was on Xarelto and yet had breakthrough infarcts.hemoglobin A1c was elevated at 7.8. Patient was changed from Xarelto to eliquis for second stroke prevention.patient had elevated platelet count of 881,000 which was up from a year ago from 623,000. 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 Bone marrow biopsy was discussed but not done.   10/11/17 visit PS: Patient has been started on hydroxyurea by oncology. Patient states that he still has some intermittent numbness and tingling in his right hand but it is getting  better it occurs once or twice a week and last only 30 seconds. This is often triggered by having his neck or arms in strange positions like stretching backwards. He is tolerating eliquis well without bleeding or bruising. He states his blood pressure is well controlled and today it is 130/79. He continues to have trouble with his sugars which remained high and last hemoglobin A1c was 8.1. Patient has chronic right knee pain and actually had scheduled right knee surgery with Dr. Juliette Alcide in February that now is willing to wait for 6 months since his stroke. He does also have sleep apnea but he has not been compliant with CPAP as he cannot tolerate it. He does have chronic paresthesias in his feet from diabetic neuropathy which is stable   Update 04/17/2018: Patient is being seen today for routine stroke follow-up appointment and overall is doing well from a stroke standpoint.  He continues to take Eliquis without bleeding or bruising for his atrial fibrillation and is managed by his cardiologist.  Continues to take Lipitor without side effects of myalgias.  Blood pressure today satisfactory 128/60.  He does have history of bilateral lower extremity diabetic neuropathy for which he takes gabapentin 300 mg at night.  He continues to have neuropathy pain along with possibly worsening neuropathy with numbness and tingling going up into his calfs.  He has been compliant with gabapentin 300 mg at night but states he does not notice a difference with his neuropathy pain.  He has had a recent fall approximately 1 week ago where he was try to  let his dog outside and when he bent over to help him out the door, he fell forward and landed on his right side.  He denies hitting his head but does have mild residual right sided pain. Patient states his knee limits him from prolonged activity and standing for any length of time along with increased difficulty ambulating.  Patient also has complaints of bilateral lower extremity  swelling.  Patient did speak with cardiologist in regards to this and recommended compression stockings but per notes, he was refusing his treatment and no additional intervention needed.  Patient continues to be noncompliant with CPAP stating he is unable to tolerate machine despite risks of not having OSA treated.  Denies new or worsening stroke/TIA symptoms.  Update 05/22/2019: [name] is being seen today for stroke follow-up.  He has been doing well from a stroke standpoint without residual deficits or reoccurring symptoms.  He continues on Eliquis for atrial fibrillation secondary stroke prevention without side effects.  Continues on atorvastatin without myalgias.  Blood pressure today 119/60.  He did undergo right knee arthroplasty on 9/62/9528 without complication.  He recently completed physical therapy but is considering participating in additional sessions.  Glucose levels have been stable. Denies new or worsening stroke/TIA symptoms. He has multiple other complaints including blurred vision, leg swelling, and insomnia.  He is routinely being followed by ophthalmology, cardiology and PCP in regards to these concerns.  Update 01/27/2022 : Patient has been referred back to see me today by Constance Holster, PA-C for new complaints of hand tremors and jerkiness.  Patient states this has been going on for a few months.  There is this is not constant but intermittently when he raises his hands he notices sudden jerk and on occasions he is throwing objects he has been holding from his hand like a pill bottle.  This happens mostly in the morning when he is sitting at the breakfast table and trying to hold objects but can occur off and on all day.  He denies any voice tremor or similar jerking in the legs.  Is also noticed increasing falls and has had 5 falls in the last 3 weeks with sustaining bruises on his elbows and hips and thighs.  He does have longstanding diabetic neuropathy with paresthesias with poor  balance as well as some residual right leg weakness from his previous stroke which could also contribute to his balance.  He has started using a cane but does not find it very helpful and feels he may fall even with a cane or walker if he had 1 in the falls or so sudden.  He does take gabapentin 600 mg at supper and quite often ends up taking another 600 mg at night if he is bothered by the paresthesias.  He has not had any recent lab work that I can see in the last lab a month ago had shown borderline creatinine and elevated potassium.  He does have thrombocytosis but this seems to be well controlled on hydroxyurea.  He remains on Eliquis which is tolerating well we will without significant bleeding but does bruise easily and has multiple bruises on his body.  Patient states he has been to physical therapy multiple times and does not find it helpful and is refusing for another referral to improve his gait and balance    UPDATE 05/05/2022 JM: patient returns for follow up after prior visit with Dr. Leonie Man 3 months ago regarding hand tremors and jerkiness. Discontinued gabapentin and switched  to topamax 7m daily.        ROS:   14 system review of systems is positive for tingling numbness burning feet, imbalance, frequent falls, tremors, hand jerking, blurred vision, leg swelling, insomnia, snoring and urgency and all other systems negative  PMH:  Past Medical History:  Diagnosis Date   A-fib ([place]    Anxiety    Arthritis    Basal cell carcinoma 07/18/1991   Left nasal brdige (MOHS)   Basal cell carcinoma 01/23/1992   lower right back-(CX35FU)   Basal cell carcinoma 05/20/2003   sup-left back (CX35FU)   Basal cell carcinoma 07/28/2011   post lower neck   Basal cell carcinoma 08/20/2008   right sideburn(MOHS), sup-Left upper back (CX35FU), sup-mid back (CX35FU), nod-Right lower back )CX35FU), nod-right upperarm (CX35FU)   Basal cell carcinoma 06/08/2016   sup-Left upper back (CX35FU), mid  back (CX35FU), right lower back (CX35FU), nod-Right upperarm (CX35FU)   Cancer (HCC)    skin - basil cell   Depression    Diabetes mellitus without complication (HCC)    Dysrhythmia    a-fib   GERD (gastroesophageal reflux disease)    Hyperlipidemia    Hypertension    Neuropathy    Obesity    Paroxysmal atrial fibrillation (HCC)    Peripheral vascular disease (HCC)    diabetic neuropathy in both feet   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 (in situ)   Sleep apnea    uses C-pap machine   Squamous cell carcinoma of skin 02/11/2013   in situ-Right temple (CX35FU)   Squamous cell carcinoma of skin 08/20/2008   in situ- front scalp (CX35FU)   Squamous cell carcinoma of skin 06/08/2016   in situ-front scalp (CX35FU)   Squamous cell carcinoma of skin 02/05/2019   in situ-right sideburn-sup (CX35FU), in situ-right sideburn,inf (CX35FU)   [place]    [place]   Superficial basal cell carcinoma (BCC) 01/20/2021   Scalp    Social History:  Social History   Socioeconomic History   Marital status: Married    Spouse name: [name]  Number of children: Not on file   Years of education: Not on file   Highest education level: Not on file  Occupational History   Not on file  Tobacco Use   Smoking status: Former    Packs/day: 1.00    Years: 30.00    Total pack years: 30.00    Types: Cigars, Cigarettes    Quit date: 08/02/1992    Years since quitting: 29.7   Smokeless tobacco: Never  Vaping Use   Vaping Use: Never used  Substance and Sexual Activity   Alcohol use: Yes    Comment: occasionally   Drug use: No   Sexual activity: Not on file  Other Topics Concern    Not on file  Social History Narrative   Lives w wife   R handed   Caffeine: 1 C of coffee a day   Social Determinants of Health   Financial Resource Strain: Not on file  Food Insecurity: Not on file  Transportation Needs: Not on file  Physical Activity: Not on file  Stress: Not on file  Social Connections: Not on file  Intimate Partner Violence: Not on file    Medications:   Current Outpatient Medications on File Prior to Visit  Medication Sig Dispense Refill   ALPRAZolam (XANAX) 0.5 MG tablet Take 0.5 mg by mouth 2 (two) times daily.     apixaban (ELIQUIS) 5 MG TABS tablet Take 1 tablet by mouth twice daily 60 tablet 5   atorvastatin (LIPITOR) 80 MG tablet Take 1 tablet (80 mg total) by mouth daily at 6 PM. 30 tablet 0   ergocalciferol (VITAMIN D2) 50000 units capsule Take 50,000 Units by mouth once a week.      glucose blood (ONETOUCH ULTRA) test strip USE 1 STRIP TO CHECK GLUCOSE 4 TIMES DAILY     hydroxyurea (HYDREA) 500 MG capsule TAKE 2 CAPSULES BY MOUTH IN THE MORNING AND 1 IN THE EVENING 270 capsule 4   insulin NPH-regular Human (NOVOLIN 70/30) (70-30) 100 UNIT/ML injection Inject 44 Units into the skin 2 (two) times daily with a meal.     levothyroxine (SYNTHROID) 75 MCG tablet TAKE 1 TABLET BY MOUTH IN THE MORNING ON AN EMPTY STOMACH 30 MINUTES BEFORE MEAL(S) AND OTHER MEDS     metFORMIN (GLUCOPHAGE) 1000 MG tablet Take 500 mg by mouth 2 (two) times daily.     metolazone (ZAROXOLYN) 2.5 MG tablet Take 1 tablet (2.5 mg total) by mouth 2 (two) times a week. Take on Tuesdays and Fridays 30 minutes before taking Torsemide. 25 tablet 3   metoprolol tartrate (LOPRESSOR) 50 MG tablet Take 50 mg by mouth 2 (two) times daily.     Multiple Vitamin (MULTIVITAMIN WITH MINERALS) TABS tablet Take 1 tablet by mouth daily.     mupirocin ointment (BACTROBAN) 2 % Apply 1 application topically 2 (two) times daily. 22 g 0   omeprazole (PRILOSEC) 20 MG capsule Take 20 mg by mouth daily.      ONETOUCH VERIO test strip SMARTSIG:Via Meter     polyethylene glycol powder (MIRALAX) 17 GM/SCOOP powder Please take 6 capfuls of MiraLAX in a 16 oz bottle of Gatorade over 2-4 hour period. The following day take 3 capfuls. On day 3 start taking 1 capful 3 times a day. Slowly cut back as needed until you have normal bowel movements. 255 g 0   potassium chloride (KLOR-CON) 10 MEQ tablet Take 2 tablets (20 mEq total) by mouth daily for 14 days. 28 tablet 0   tamsulosin (FLOMAX) 0.4 MG CAPS capsule Take 0.4 mg by mouth daily.     topiramate (TOPAMAX) 50 MG tablet Take 1 tablet (50 mg total) by mouth at bedtime and may repeat dose one time if needed. 30 tablet 3   torsemide (DEMADEX) 20 MG tablet Take 3 tablets (60 mg total) by mouth daily. 270 tablet 3   venlafaxine XR (EFFEXOR-XR) 75 MG 24 hr capsule Take 75 mg by mouth daily.     No current facility-administered medications on file prior to visit.    Allergies:   Allergies  Allergen Reactions   Penicillin G Sodium Other (See Comments)    There were no vitals filed for this visit.  There is no height or weight on file to calculate BMI.   Physical Exam General: Obese elderly Caucasian male, , seated, in no evident distress Head: head normocephalic and atraumatic.  Neck: supple with no carotid or supraclavicular bruits Cardiovascular: regular rate and rhythm, no murmurs; 1+ pitting edema BLE Musculoskeletal: no deformity Skin:  no rash but multiple bruising left buttock,  left upper lateral thigh, bilateral elbows Vascular:  Normal pulses all extremities   Neurologic Exam Mental Status: Awake and fully alert. Oriented to place and time. Recent and remote memory intact. Attention span, concentration and fund of knowledge appropriate. Mood and affect appropriate.  Cranial Nerves: Pupils equal, briskly reactive to light. Extraocular movements full without nystagmus. Visual fields full to confrontation. Hearing intact. Facial sensation  intact. Face, tongue, palate moves normally and symmetrically.  Motor: Normal bulk and tone. Normal strength in all tested extremity muscles except mild weakness of right ankle dorsiflexors and plantar flexors.. Sensory.:  Decreased sensation to touch ,pinprick and vibratory sensation in bilateral lower extremities distally.  Romberg sign is positive. Coordination: Rapid alternating movements normal in all extremities. Finger-to-nose performed accurately and mild difficulty performing heel-to-shin due to pain Gait and Station: Arises from chair with  difficulty.  Gait demonstrates slightly favoring right knee but otherwise broad-based gait with slight imbalance with use of cane. Reflexes: 1+ and symmetric except ankle jerks are depressed. Toes downgoing.     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 He also has essential thrombocytosis which which appears well controlled on hydroxyurea.  Vascular risk factors of diabetes, hyperlipidemia, obesity, OSA noncompliant with CPAP,atrial fibrillation and essential thrombocytosis.  New complaints of upper extremity tremors and myoclonic jerks likely due to metabolic encephalopathy from his mild renal and electrolyte dysfunction as well as medication effect from gabapentin.  Increasing gait and balance difficulties and frequent falls due to combination of underlying diabetic neuropathy as well as damage from stroke and medication effect.    - continue topamax 61m nightly     gabapentin by tapering it to 600 mg once a day for a week and then stopping it.  Instead try Topamax 50 mg at night to help with neuropathic pain and increase if needed and as tolerated.  His gait difficulties are multifactorial due to combination of longstanding diabetic neuropathy as well as right leg weakness from his previous stroke and his obesity.  Patient is refusing referral to  physical therapy for gait and balance training.  Discussed fall prevention precautions with him and advised him to use a cane when ambulating at all times.  He will continue on Eliquis for stroke prevention for his atrial fibrillation and maintain aggressive risk factor modification with strict control of hypertension with blood pressure goal below 130/90, diabetes with hemoglobin A1c goal below 6.5% and lipids with LDL cholesterol goal below 70 mg percent.  Return for follow-up in the future in 3 months with my nurse practitioner JJanett Billow      CC:  SReynold Bowen MD    I spent *** minutes of face-to-face and non-face-to-face time with patient.  This included previsit chart review, lab review, study review, order entry, electronic health record documentation, patient education   JFrann Rider [facility] [address] [address] Phone [phone] [phone]: This document was prepared with digital dictation and possible smart phrase technology. Any transcriptional errors that result from this process are unintentional.

## 2022-05-05 ENCOUNTER — Ambulatory Visit: Payer: Medicare HMO | Admitting: Adult Health

## 2022-05-05 ENCOUNTER — Encounter: Payer: Self-pay | Admitting: Adult Health

## 2022-05-05 VITALS — BP 101/56 | HR 68 | Ht 71.0 in | Wt 268.2 lb

## 2022-05-05 DIAGNOSIS — R269 Unspecified abnormalities of gait and mobility: Secondary | ICD-10-CM

## 2022-05-05 DIAGNOSIS — R4189 Other symptoms and signs involving cognitive functions and awareness: Secondary | ICD-10-CM

## 2022-05-05 DIAGNOSIS — R251 Tremor, unspecified: Secondary | ICD-10-CM

## 2022-05-05 DIAGNOSIS — I63512 Cerebral infarction due to unspecified occlusion or stenosis of left middle cerebral artery: Secondary | ICD-10-CM

## 2022-05-05 NOTE — Patient Instructions (Addendum)
Your Plan:  Continue topiramate '50mg'$  but start taking at night to see if this helps with morning symptoms - if you notice your tremors start to worsen by evening please let me know  Recommend doing memory exercises, ensuring good sleep habits, healthy diet and adequate exercise   Would recommend completing neuro cognitive evaluation with a neuropsychologist - you will be called to schedule visit by [facility]   Highly recommend use of CPAP treatment for sleep apnea as untreated sleep apnea can be contributing to your memory issues as well as sleeping difficulties.     Follow up in 6 months or call earlier if needed      Thank you for coming to see [place] at [facility]. I hope we have been able to provide you high quality care today.  You may receive a patient satisfaction survey over the next few weeks. We would appreciate your feedback and comments so that we may continue to improve ourselves and the health of our patients.

## 2022-05-10 ENCOUNTER — Telehealth: Payer: Self-pay | Admitting: Adult Health

## 2022-05-10 NOTE — Telephone Encounter (Signed)
Referral sent to psychology Contact info: [facility]                       [phone]

## 2022-05-12 DIAGNOSIS — C4339 Malignant melanoma of other parts of face: Secondary | ICD-10-CM | POA: Diagnosis not present

## 2022-05-12 DIAGNOSIS — L905 Scar conditions and fibrosis of skin: Secondary | ICD-10-CM | POA: Diagnosis not present

## 2022-05-12 DIAGNOSIS — L989 Disorder of the skin and subcutaneous tissue, unspecified: Secondary | ICD-10-CM | POA: Diagnosis not present

## 2022-05-18 DIAGNOSIS — H35373 Puckering of macula, bilateral: Secondary | ICD-10-CM | POA: Diagnosis not present

## 2022-05-18 DIAGNOSIS — Z961 Presence of intraocular lens: Secondary | ICD-10-CM | POA: Diagnosis not present

## 2022-05-18 DIAGNOSIS — Z794 Long term (current) use of insulin: Secondary | ICD-10-CM | POA: Diagnosis not present

## 2022-05-18 DIAGNOSIS — E113513 Type 2 diabetes mellitus with proliferative diabetic retinopathy with macular edema, bilateral: Secondary | ICD-10-CM | POA: Diagnosis not present

## 2022-06-02 ENCOUNTER — Other Ambulatory Visit: Payer: Self-pay | Admitting: Neurology

## 2022-06-03 DIAGNOSIS — S0120XA Unspecified open wound of nose, initial encounter: Secondary | ICD-10-CM | POA: Diagnosis not present

## 2022-06-20 ENCOUNTER — Other Ambulatory Visit: Payer: Self-pay | Admitting: Interventional Cardiology

## 2022-07-07 DIAGNOSIS — Z4801 Encounter for change or removal of surgical wound dressing: Secondary | ICD-10-CM | POA: Diagnosis not present

## 2022-07-07 DIAGNOSIS — Z48817 Encounter for surgical aftercare following surgery on the skin and subcutaneous tissue: Secondary | ICD-10-CM | POA: Diagnosis not present

## 2022-07-15 DIAGNOSIS — M6281 Muscle weakness (generalized): Secondary | ICD-10-CM | POA: Diagnosis not present

## 2022-07-15 DIAGNOSIS — M170 Bilateral primary osteoarthritis of knee: Secondary | ICD-10-CM | POA: Diagnosis not present

## 2022-07-15 DIAGNOSIS — M25561 Pain in right knee: Secondary | ICD-10-CM | POA: Diagnosis not present

## 2022-07-15 DIAGNOSIS — M25562 Pain in left knee: Secondary | ICD-10-CM | POA: Diagnosis not present

## 2022-07-15 DIAGNOSIS — R278 Other lack of coordination: Secondary | ICD-10-CM | POA: Diagnosis not present

## 2022-07-15 DIAGNOSIS — R296 Repeated falls: Secondary | ICD-10-CM | POA: Diagnosis not present

## 2022-07-27 DIAGNOSIS — E113513 Type 2 diabetes mellitus with proliferative diabetic retinopathy with macular edema, bilateral: Secondary | ICD-10-CM | POA: Diagnosis not present

## 2022-07-27 DIAGNOSIS — H43813 Vitreous degeneration, bilateral: Secondary | ICD-10-CM | POA: Diagnosis not present

## 2022-07-27 DIAGNOSIS — H3589 Other specified retinal disorders: Secondary | ICD-10-CM | POA: Diagnosis not present

## 2022-07-27 DIAGNOSIS — Z794 Long term (current) use of insulin: Secondary | ICD-10-CM | POA: Diagnosis not present

## 2022-07-27 DIAGNOSIS — Z961 Presence of intraocular lens: Secondary | ICD-10-CM | POA: Diagnosis not present

## 2022-07-27 DIAGNOSIS — H35373 Puckering of macula, bilateral: Secondary | ICD-10-CM | POA: Diagnosis not present

## 2022-07-29 DIAGNOSIS — Z96652 Presence of left artificial knee joint: Secondary | ICD-10-CM | POA: Diagnosis not present

## 2022-07-29 DIAGNOSIS — M1612 Unilateral primary osteoarthritis, left hip: Secondary | ICD-10-CM | POA: Diagnosis not present

## 2022-07-29 DIAGNOSIS — M25552 Pain in left hip: Secondary | ICD-10-CM | POA: Diagnosis not present

## 2022-07-29 DIAGNOSIS — M25562 Pain in left knee: Secondary | ICD-10-CM | POA: Diagnosis not present

## 2022-08-07 ENCOUNTER — Other Ambulatory Visit: Payer: Self-pay | Admitting: Hematology and Oncology

## 2022-08-07 DIAGNOSIS — D473 Essential (hemorrhagic) thrombocythemia: Secondary | ICD-10-CM

## 2022-08-08 ENCOUNTER — Other Ambulatory Visit: Payer: Self-pay

## 2022-08-08 ENCOUNTER — Inpatient Hospital Stay (HOSPITAL_BASED_OUTPATIENT_CLINIC_OR_DEPARTMENT_OTHER): Payer: Medicare HMO | Admitting: Hematology and Oncology

## 2022-08-08 ENCOUNTER — Inpatient Hospital Stay: Payer: Medicare HMO | Attending: Hematology and Oncology

## 2022-08-08 VITALS — BP 119/72 | HR 52 | Temp 97.2°F | Resp 16 | Wt 263.0 lb

## 2022-08-08 DIAGNOSIS — D473 Essential (hemorrhagic) thrombocythemia: Secondary | ICD-10-CM

## 2022-08-08 LAB — CMP (CANCER CENTER ONLY)
ALT: 13 U/L (ref 0–44)
AST: 14 U/L — ABNORMAL LOW (ref 15–41)
Albumin: 3.9 g/dL (ref 3.5–5.0)
Alkaline Phosphatase: 106 U/L (ref 38–126)
Anion gap: 4 — ABNORMAL LOW (ref 5–15)
BUN: 31 mg/dL — ABNORMAL HIGH (ref 8–23)
CO2: 35 mmol/L — ABNORMAL HIGH (ref 22–32)
Calcium: 10 mg/dL (ref 8.9–10.3)
Chloride: 96 mmol/L — ABNORMAL LOW (ref 98–111)
Creatinine: 1.51 mg/dL — ABNORMAL HIGH (ref 0.61–1.24)
GFR, Estimated: 47 mL/min — ABNORMAL LOW (ref >60)
Glucose, Bld: 259 mg/dL — ABNORMAL HIGH (ref 70–99)
Potassium: 4 mmol/L (ref 3.5–5.1)
Sodium: 135 mmol/L (ref 135–145)
Total Bilirubin: 0.5 mg/dL (ref 0.3–1.2)
Total Protein: 6.5 g/dL (ref 6.5–8.1)

## 2022-08-08 LAB — CBC WITH DIFFERENTIAL (CANCER CENTER ONLY)
Abs Immature Granulocytes: 0.01 10*3/uL (ref 0.00–0.07)
Basophils Absolute: 0 10*3/uL (ref 0.0–0.1)
Basophils Relative: 1 %
Eosinophils Absolute: 0.1 10*3/uL (ref 0.0–0.5)
Eosinophils Relative: 2 %
HCT: 27.1 % — ABNORMAL LOW (ref 39.0–52.0)
Hemoglobin: 9.8 g/dL — ABNORMAL LOW (ref 13.0–17.0)
Immature Granulocytes: 0 %
Lymphocytes Relative: 24 %
Lymphs Abs: 0.8 10*3/uL (ref 0.7–4.0)
MCH: 44.7 pg — ABNORMAL HIGH (ref 26.0–34.0)
MCHC: 36.2 g/dL — ABNORMAL HIGH (ref 30.0–36.0)
MCV: 123.7 fL — ABNORMAL HIGH (ref 80.0–100.0)
Monocytes Absolute: 0.3 10*3/uL (ref 0.1–1.0)
Monocytes Relative: 8 %
Neutro Abs: 2.1 10*3/uL (ref 1.7–7.7)
Neutrophils Relative %: 65 %
Platelet Count: 406 10*3/uL — ABNORMAL HIGH (ref 150–400)
RBC: 2.19 MIL/uL — ABNORMAL LOW (ref 4.22–5.81)
RDW: 14.7 % (ref 11.5–15.5)
WBC Count: 3.3 10*3/uL — ABNORMAL LOW (ref 4.0–10.5)
nRBC: 0.6 % — ABNORMAL HIGH (ref 0.0–0.2)

## 2022-08-08 NOTE — Progress Notes (Signed)
[place] [phone]   [phone]  PROGRESS NOTE  Patient Care Team: [name], MD as PCP - General (Endocrinology) Belva Crome, MD as PCP - Cardiology (Cardiology) Thompson Grayer, MD as PCP - Electrophysiology (Cardiology) Magrinat, Virgie Dad, MD (Inactive) as Consulting Physician (Oncology) Irene Shipper, MD as Consulting Physician (Gastroenterology) Lavonna Monarch, MD (Inactive) as Consulting Physician (Dermatology) Roel Cluck, MD as Referring Physician (Ophthalmology) Garvin Fila, MD as Consulting Physician (Neurology)  Hematological/Oncological History # 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 Jana Hakim. 08/08/2022: Establish care with Dr. Lorenso Courier  Interval History:  [name] 81 y.o. male with medical history significant for essential thrombocytosis who presents for a follow up visit. The patient's last visit was on 06/28/2021 with Dr. Jana Hakim. In the interim since the last visit he has had no major changes in his health.  On exam today [name] without any difficulty.  He notes that he takes it every 12 hours as prescribed.  He reports his energy levels are good and he is had no recent [name].  He also denies having any trouble with stomach upset, mouth ulcers, ankle ulcers, or diarrhea.  He denies any fevers, chills, sweats, nausea, vomiting or diarrhea.  A full 10 point ROS was otherwise negative.  The bulk of our discussion focused on assuring he understood the diagnosis and the treatment.  We reiterated the importance of hydroxyurea therapy.  Also we noted that he has cytopenias today which do require changing of the doses of his medication as noted below.  Patient voices understanding and our plan and is willing and able to continue on therapy at this time.  MEDICAL HISTORY:   Past Medical History:  Diagnosis Date   A-fib [facility])    Anxiety    Arthritis    Basal cell carcinoma 07/18/1991   Left nasal brdige (MOHS)   Basal cell carcinoma 01/23/1992   lower right back-(CX35FU)   Basal cell carcinoma 05/20/2003   sup-left back (CX35FU)   Basal cell carcinoma 07/28/2011   post lower neck   Basal cell carcinoma 08/20/2008   right sideburn(MOHS), sup-Left upper back (CX35FU), sup-mid back (CX35FU), nod-Right lower back )CX35FU), nod-right upperarm (CX35FU)   Basal cell carcinoma 06/08/2016   sup-Left upper back (CX35FU), mid back (CX35FU), right lower back (CX35FU), nod-Right upperarm (CX35FU)   Cancer (HCC)    skin - basil cell   Depression    Diabetes mellitus without complication (HCC)    Dysrhythmia    a-fib   GERD (gastroesophageal reflux disease)    Hyperlipidemia    Hypertension    Neuropathy    Obesity    Paroxysmal atrial fibrillation (HCC)    Peripheral vascular disease (HCC)    diabetic neuropathy in both feet   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 (in situ)   Sleep apnea    uses C-pap machine   Squamous cell carcinoma of skin 02/11/2013   in situ-Right temple (CX35FU)   Squamous cell carcinoma of skin 08/20/2008   in situ- front scalp (CX35FU)  Squamous cell carcinoma of skin 06/08/2016   in situ-front scalp (CX35FU)   Squamous cell carcinoma of skin 02/05/2019   in situ-right sideburn-sup (CX35FU), in situ-right sideburn,inf (CX35FU)   Stroke ([place])    08/09/2017   Superficial basal cell carcinoma (BCC) 01/20/2021   Scalp    SURGICAL HISTORY: Past Surgical History:  Procedure Laterality Date   APPENDECTOMY  1962   BACK  SURGERY  00-02-12   x3   BASAL CELL CARCINOMA EXCISION  93/06/10   COLONOSCOPY     KNEE ARTHROSCOPY  005/01/02   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   Procedure: TOTAL LEFT KNEE ARTHROPLASTY;  Surgeon: [name], MD;  Location: [facility];  Service: Orthopedics;  Laterality: Left;   TOTAL KNEE ARTHROPLASTY Right 10/08/2018   Procedure: RIGHT TOTAL KNEE ARTHROPLASTY;  Surgeon: [name], MD;  Location: [facility];  Service: Orthopedics;  Laterality: Right;  7mn    SOCIAL HISTORY: Social History   Socioeconomic History   Marital status: Married    Spouse name: [name]  Number of children: Not on file   Years of education: Not on file   Highest education level: Not on file  Occupational History   Not on file  Tobacco Use   Smoking status: Former    Packs/day: 1.00    Years: 30.00    Total pack years: 30.00    Types: Cigars, Cigarettes    Quit date: 08/02/1992    Years since quitting: 30.0   Smokeless tobacco: Never  Vaping Use   Vaping Use: Never used  Substance and Sexual Activity   Alcohol use: Yes    Comment: occasionally   Drug use: No   Sexual activity: Not on file  Other Topics Concern   Not on file  Social History Narrative   Lives w wife   R handed   Caffeine: 1 C of coffee a day   Social Determinants of HRadio broadcast assistantStrain: Not on file  Food Insecurity: Not on file  Transportation Needs: Not on file  Physical Activity: Not on file  Stress: Not on file  Social Connections: Not on file  Intimate Partner Violence: Not on file    FAMILY HISTORY: Family History  Problem Relation Age of Onset   Cancer Mother    Diabetes Mellitus II Mother    Hypertension Mother    Heart failure Father    CVA Father    Hypertension Sister    Colon cancer Neg Hx     ALLERGIES:  is allergic to penicillin g sodium.  MEDICATIONS:  Current Outpatient Medications  Medication Sig Dispense Refill   ALPRAZolam (XANAX) 0.5 MG tablet Take 0.5 mg by mouth  2 (two) times daily.     apixaban (ELIQUIS) 5 MG TABS tablet Take 1 tablet by mouth twice daily 60 tablet 5   atorvastatin (LIPITOR) 80 MG tablet Take 1 tablet (80 mg total) by mouth daily at 6 PM. 30 tablet 0   ergocalciferol (VITAMIN D2) 50000 units capsule Take 50,000 Units by mouth once a week.      glucose blood (ONETOUCH ULTRA) test strip USE 1 STRIP TO CHECK GLUCOSE 4 TIMES DAILY     hydroxyurea (HYDREA) 500 MG capsule TAKE 2 CAPSULES BY MOUTH IN THE MORNING AND 1 IN THE EVENING 270 capsule 4   insulin NPH-regular Human (NOVOLIN 70/30) (70-30) 100 UNIT/ML injection Inject 44 Units into the skin 2 (two) times daily with a meal.     levothyroxine (SYNTHROID)  75 MCG tablet TAKE 1 TABLET BY MOUTH IN THE MORNING ON AN EMPTY STOMACH 30 MINUTES BEFORE MEAL(S) AND OTHER MEDS     metFORMIN (GLUCOPHAGE) 1000 MG tablet Take 500 mg by mouth 2 (two) times daily.     metolazone (ZAROXOLYN) 2.5 MG tablet Take 1 tablet (2.5 mg total) by mouth 2 (two) times a week. Take on Tuesdays and Fridays 30 minutes before taking Torsemide. 25 tablet 3   metoprolol tartrate (LOPRESSOR) 50 MG tablet Take 50 mg by mouth 2 (two) times daily.     Multiple Vitamin (MULTIVITAMIN WITH MINERALS) TABS tablet Take 1 tablet by mouth daily.     mupirocin ointment (BACTROBAN) 2 % Apply 1 application topically 2 (two) times daily. 22 g 0   omeprazole (PRILOSEC) 20 MG capsule Take 20 mg by mouth daily.     ONETOUCH VERIO test strip SMARTSIG:Via Meter     polyethylene glycol powder (MIRALAX) 17 GM/SCOOP powder Please take 6 capfuls of MiraLAX in a 16 oz bottle of Gatorade over 2-4 hour period. The following day take 3 capfuls. On day 3 start taking 1 capful 3 times a day. Slowly cut back as needed until you have normal bowel movements. 255 g 0   potassium chloride (KLOR-CON) 10 MEQ tablet Take 2 tablets (20 mEq total) by mouth daily for 14 days. 28 tablet 0   tamsulosin (FLOMAX) 0.4 MG CAPS capsule Take 0.4 mg by mouth daily.      topiramate (TOPAMAX) 50 MG tablet TAKE 1 TABLET BY MOUTH AT BEDTIME ,  MAY  REPEAT  DOSE  ONE TIME  IF  NEEDED 30 tablet 3   torsemide (DEMADEX) 20 MG tablet Take 3 tablets by mouth once daily 270 tablet 0   venlafaxine XR (EFFEXOR-XR) 75 MG 24 hr capsule Take 75 mg by mouth daily.     No current facility-administered medications for this visit.    REVIEW OF SYSTEMS:   Constitutional: ( - ) fevers, ( - )  chills , ( - ) night sweats Eyes: ( - ) blurriness of vision, ( - ) double vision, ( - ) watery eyes Ears, nose, mouth, throat, and face: ( - ) mucositis, ( - ) sore throat Respiratory: ( - ) cough, ( - ) dyspnea, ( - ) wheezes Cardiovascular: ( - ) palpitation, ( - ) chest discomfort, ( - ) lower extremity swelling Gastrointestinal:  ( - ) nausea, ( - ) heartburn, ( - ) change in bowel habits Skin: ( - ) abnormal skin rashes Lymphatics: ( - ) new lymphadenopathy, ( - ) easy bruising Neurological: ( - ) numbness, ( - ) tingling, ( - ) new weaknesses Behavioral/Psych: ( - ) mood change, ( - ) new changes  All other systems were reviewed with the patient and are negative.  PHYSICAL EXAMINATION: ECOG PERFORMANCE STATUS: 1 - Symptomatic but completely ambulatory  Vitals:   08/08/22 1523  BP: 119/72  Pulse: (!) 52  Resp: 16  Temp: (!) 97.2 F (36.2 C)  SpO2: 95%   Filed Weights   08/08/22 1523  Weight: 263 lb (119.3 kg)    GENERAL: [name]-appearing elderly Caucasian male alert, no distress and comfortable SKIN: skin color, texture, turgor are normal, no rashes or significant lesions EYES: conjunctiva are pink and non-injected, sclera clear LUNGS: clear to auscultation and percussion with normal breathing effort HEART: regular rate & rhythm and no murmurs and no lower extremity edema Musculoskeletal: no cyanosis of digits and no clubbing  PSYCH: alert & oriented x 3, fluent speech NEURO: no focal motor/sensory deficits  LABORATORY DATA:  I have reviewed the data as listed     Latest Ref Rng & Units 08/08/2022    2:38 PM 02/17/2022   12:35 AM 01/27/2022    2:51 PM  CBC  WBC 4.0 - 10.5 K/uL 3.3  6.3  5.7   Hemoglobin 13.0 - 17.0 g/dL 9.8  11.3  11.1   Hematocrit 39.0 - 52.0 % 27.1  30.8  30.9   Platelets 150 - 400 K/uL 406  443  454        Latest Ref Rng & Units 08/08/2022    2:38 PM 02/17/2022   12:35 AM 01/27/2022    2:51 PM  CMP  Glucose 70 - 99 mg/dL 259  88  240   BUN 8 - 23 mg/dL 31  40  28   Creatinine 0.61 - 1.24 mg/dL 1.51  1.60  1.39   Sodium 135 - 145 mmol/L 135  137  137   Potassium 3.5 - 5.1 mmol/L 4.0  2.7  4.0   Chloride 98 - 111 mmol/L 96  92  91   CO2 22 - 32 mmol/L 35  35  32   Calcium 8.9 - 10.3 mg/dL 10.0  10.1  9.8   Total Protein 6.5 - 8.1 g/dL 6.5  6.6  6.2   Total Bilirubin 0.3 - 1.2 mg/dL 0.5  1.0  0.8   Alkaline Phos 38 - 126 U/L 106  93  128   AST 15 - 41 U/L _0 ALT 0 - 44 U/L _1 RADIOGRAPHIC STUDIES: No results found.  ASSESSMENT & PLAN [name] 81 y.o. male with medical history significant for essential thrombocytosis who presents for a follow up visit.   (1) essential thrombocytosis: Started on Hydrea 1000 mg a day 10/06/2017             (a) Hydrea dose increased to 1500 mg daily beginning 06/27/2019   (2) bone marrow biopsy discussed, postponed   (3) 0.7 cm right upper lung nodule incidentally noted on neck CT scans November 2018             (a) no change on repeat CT of the chest with contrast 02/28/2018             (b) chest x-ray 10/13/2018 obtained for evaluation of a cough shows no mention of a mass  # Essential Thrombocytosis JAK2 Positive.  -- Recommend the patient lower his dosage of hydroxyurea to 1 pill in the morning and 1 pill in the evening due to cytopenias. --Labs today show white blood cell count 3.3, hemoglobin 9.8, MCV 123.7, and platelets 406 --Patient currently tolerating medication [name] with no major side effects. --plan for labs in 1 months time with clinic visit in  6 months.   No orders of the defined types were placed in this encounter.   All questions were answered. The patient knows to call the clinic with any problems, questions or concerns.  A total of more than 40 minutes were spent on this encounter with face-to-face time and non-face-to-face time, including preparing to see the patient, ordering tests and/or medications, counseling the patient and coordination of care as outlined above.   Ledell Peoples, MD Department of Hematology/Oncology [place] at [facility] Phone: [phone] Pager: [phone] Email: [email or web address]  08/14/2022 6:11  PM

## 2022-08-10 DIAGNOSIS — H3589 Other specified retinal disorders: Secondary | ICD-10-CM | POA: Diagnosis not present

## 2022-08-10 DIAGNOSIS — Z794 Long term (current) use of insulin: Secondary | ICD-10-CM | POA: Diagnosis not present

## 2022-08-10 DIAGNOSIS — E113513 Type 2 diabetes mellitus with proliferative diabetic retinopathy with macular edema, bilateral: Secondary | ICD-10-CM | POA: Diagnosis not present

## 2022-08-14 NOTE — Progress Notes (Unsigned)
Cardiology Office Note:    Date:  08/15/2022   ID:  [name], DOB [birth year], MRN [id number]  PCP:  [name], MD  Cardiologist:  [name], MD   Referring MD: [name], MD   Chief Complaint  Patient presents with   Atrial Fibrillation   Congestive Heart Failure    History of Present Illness:    [name] is a 81 y.o. male with a hx of  paroxysmal atrial fibrillation on Eliquis, chronic diastolic CHF, CVA, OSA refuses CPAP, HTN, DM2, PAD, PVD, HLD, morbid obesity, medical non compliance     There are no specific complaints.  He denies chest pain.  Wants to know if he is still in A-fib.  No bleeding on Eliquis.  He sleeps in a recliner.  He has chronic lower extremity swelling.  Most of the time is spent in his recliner with legs dominantly angulated.  He never gets in the bed.  Past Medical History:  Diagnosis Date   A-fib [facility])    Anxiety    Arthritis    Basal cell carcinoma 07/18/1991   Left nasal brdige (MOHS)   Basal cell carcinoma 01/23/1992   lower right back-(CX35FU)   Basal cell carcinoma 05/20/2003   sup-left back (CX35FU)   Basal cell carcinoma 07/28/2011   post lower neck   Basal cell carcinoma 08/20/2008   right sideburn(MOHS), sup-Left upper back (CX35FU), sup-mid back (CX35FU), nod-Right lower back )CX35FU), nod-right upperarm (CX35FU)   Basal cell carcinoma 06/08/2016   sup-Left upper back (CX35FU), mid back (CX35FU), right lower back (CX35FU), nod-Right upperarm (CX35FU)   Cancer (HCC)    skin - basil cell   Depression    Diabetes mellitus without complication (HCC)    Dysrhythmia    a-fib   GERD (gastroesophageal reflux disease)    Hyperlipidemia    Hypertension    Neuropathy    Obesity    Paroxysmal atrial fibrillation (HCC)    Peripheral vascular disease (HCC)    diabetic neuropathy in both feet   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   in situ left forearm posterior tx after biopsy    SCCA (squamous  cell carcinoma) of skin 01/20/2021   Right Forearm Posterior (in situ)   SCCA (squamous cell carcinoma) of skin 01/20/2021   Left Forearm Posterior (in situ)   SCCA (squamous cell carcinoma) of skin 01/24/2022   Right Temple Sup. (in situ)   Sleep apnea    uses C-pap machine   Squamous cell carcinoma of skin 02/11/2013   in situ-Right temple (CX35FU)   Squamous cell carcinoma of skin 08/20/2008   in situ- front scalp (CX35FU)   Squamous cell carcinoma of skin 06/08/2016   in situ-front scalp (CX35FU)   Squamous cell carcinoma of skin 02/05/2019   in situ-right sideburn-sup (CX35FU), in situ-right sideburn,inf (CX35FU)   Stroke ([place])    08/09/2017   Superficial basal cell carcinoma (BCC) 01/20/2021   Scalp    Past Surgical History:  Procedure Laterality Date   APPENDECTOMY  1962   BACK SURGERY  00-02-12   x3   BASAL CELL CARCINOMA EXCISION  93/06/10   COLONOSCOPY     KNEE ARTHROSCOPY  005/01/02   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   Procedure: TOTAL LEFT KNEE ARTHROPLASTY;  Surgeon: [name], MD;  Location: [facility];  Service: Orthopedics;  Laterality: Left;   TOTAL KNEE ARTHROPLASTY Right 10/08/2018   Procedure: RIGHT TOTAL KNEE ARTHROPLASTY;  Surgeon: [name],  MD;  Location: [facility];  Service: Orthopedics;  Laterality: Right;  72mn    Current Medications: Current Meds  Medication Sig   ALPRAZolam (XANAX) 0.5 MG tablet Take 0.5 mg by mouth 2 (two) times daily.   apixaban (ELIQUIS) 5 MG TABS tablet Take 1 tablet by mouth twice daily   atorvastatin (LIPITOR) 80 MG tablet Take 1 tablet (80 mg total) by mouth daily at 6 PM.   ergocalciferol (VITAMIN D2) 50000 units capsule Take 50,000 Units by mouth once a week.    gabapentin (NEURONTIN) 600 MG tablet Take 600 mg by mouth daily at 12 noon.   glucose blood (ONETOUCH ULTRA) test strip USE 1 STRIP TO CHECK GLUCOSE 4 TIMES DAILY   insulin NPH-regular Human (NOVOLIN 70/30) (70-30) 100 UNIT/ML injection Inject 44 Units  into the skin 2 (two) times daily with a meal.   levothyroxine (SYNTHROID) 75 MCG tablet TAKE 1 TABLET BY MOUTH IN THE MORNING ON AN EMPTY STOMACH 30 MINUTES BEFORE MEAL(S) AND OTHER MEDS   metFORMIN (GLUCOPHAGE) 1000 MG tablet Take 500 mg by mouth 2 (two) times daily.   metolazone (ZAROXOLYN) 2.5 MG tablet Take 1 tablet (2.5 mg total) by mouth 2 (two) times a week. Take on Tuesdays and Fridays 30 minutes before taking Torsemide.   metoprolol tartrate (LOPRESSOR) 50 MG tablet Take 50 mg by mouth 2 (two) times daily.   Multiple Vitamin (MULTIVITAMIN WITH MINERALS) TABS tablet Take 1 tablet by mouth daily.   omeprazole (PRILOSEC) 20 MG capsule Take 20 mg by mouth daily.   ONETOUCH VERIO test strip SMARTSIG:Via Meter   polyethylene glycol powder (MIRALAX) 17 GM/SCOOP powder Please take 6 capfuls of MiraLAX in a 16 oz bottle of Gatorade over 2-4 hour period. The following day take 3 capfuls. On day 3 start taking 1 capful 3 times a day. Slowly cut back as needed until you have normal bowel movements.   tamsulosin (FLOMAX) 0.4 MG CAPS capsule Take 0.4 mg by mouth daily.   topiramate (TOPAMAX) 50 MG tablet TAKE 1 TABLET BY MOUTH AT BEDTIME ,  MAY  REPEAT  DOSE  ONE TIME  IF  NEEDED   torsemide (DEMADEX) 20 MG tablet Take 3 tablets by mouth once daily   venlafaxine XR (EFFEXOR-XR) 75 MG 24 hr capsule Take 75 mg by mouth daily.     Allergies:   Penicillin g sodium   Social History   Socioeconomic History   Marital status: Married    Spouse name: [name]  Number of children: Not on file   Years of education: Not on file   Highest education level: Not on file  Occupational History   Not on file  Tobacco Use   Smoking status: Former    Packs/day: 1.00    Years: 30.00    Total pack years: 30.00    Types: Cigars, Cigarettes    Quit date: 08/02/1992    Years since quitting: 30.0   Smokeless tobacco: Never  Vaping Use   Vaping Use: Never used  Substance and Sexual Activity   Alcohol use: Yes     Comment: occasionally   Drug use: No   Sexual activity: Not on file  Other Topics Concern   Not on file  Social History Narrative   Lives w wife   R handed   Caffeine: 1 C of coffee a day   Social Determinants of HRadio broadcast assistantStrain: Not on file  Food Insecurity: Not on file  Transportation Needs:  Not on file  Physical Activity: Not on file  Stress: Not on file  Social Connections: Not on file     Family History: The patient's family history includes CVA in his father; Cancer in his mother; Diabetes Mellitus II in his mother; Heart failure in his father; Hypertension in his mother and sister. There is no history of Colon cancer.  ROS:   Please see the history of present illness.    Not repeated a not repeated all other systems reviewed and are negative.  EKGs/Labs/Other Studies Reviewed:    The following studies were reviewed today: 2 D Doppler ECHOCARDIOGRAM 10/16/2020 IMPRESSIONS   1. Left ventricular ejection fraction, by estimation, is 60 to 65%. The  left ventricle has normal function. The left ventricle has no regional  wall motion abnormalities. There is mild concentric left ventricular  hypertrophy. Left ventricular diastolic  parameters are indeterminate.   2. Right ventricular systolic function is normal. The right ventricular  size is mildly enlarged.   3. The mitral valve is normal in structure. No evidence of mitral valve  regurgitation.   4. The aortic valve is tricuspid. There is mild calcification of the  aortic valve. Aortic valve regurgitation is not visualized. No aortic  stenosis is present.   5. Aortic dilatation noted. There is borderline dilatation of the aortic  root, measuring 38 mm. There is borderline dilatation of the ascending  aorta, measuring 39 mm.   Comparison(s): A prior study was performed on 06/02/2020. Prior images  reviewed side by side. Ascending aorta appears slightly larger.   EKG:  EKG not repeated  Recent  Labs: 11/16/2021: Magnesium 1.7; NT-Pro BNP 510 11/25/2021: TSH 1.720 08/08/2022: ALT 13; BUN 31; Creatinine 1.51; Hemoglobin 9.8; Platelet Count 406; Potassium 4.0; Sodium 135  Recent Lipid Panel No results found for: "CHOL", "TRIG", "HDL", "CHOLHDL", "VLDL", "LDLCALC", "LDLDIRECT"  Physical Exam:    VS:  BP 112/60   Pulse 65   Ht '5\' 11"'$  (1.803 m)   Wt 262 lb (118.8 kg)   SpO2 96%   BMI 36.54 kg/m     Wt Readings from Last 3 Encounters:  08/15/22 262 lb (118.8 kg)  08/08/22 263 lb (119.3 kg)  05/05/22 268 lb 4 oz (121.7 kg)     GEN: Obese. No acute distress HEENT: Normal NECK: No JVD. LYMPHATICS: No lymphadenopathy CARDIAC: No murmur. RRR no gallop, or edema. VASCULAR:  Normal Pulses. No bruits. RESPIRATORY:  Clear to auscultation without rales, wheezing or rhonchi  ABDOMEN: Soft, non-tender, non-distended, No pulsatile mass, MUSCULOSKELETAL: No deformity  SKIN: Warm and dry NEUROLOGIC:  Alert and oriented x 3 PSYCHIATRIC:  Normal affect   ASSESSMENT:    1. Persistent atrial fibrillation ([place])   2. Chronic diastolic CHF (congestive heart failure) ([place])   3. Morbid obesity ([name])   4. Hyperlipidemia, unspecified hyperlipidemia type   5. Obstructive sleep apnea   6. Essential hypertension   7. Chronic anticoagulation    PLAN:    In order of problems listed above:  Good rate control noted Not short of breath.  No JVD.  Lungs clear.  No clinical evidence of volume overload. Continues as a problem Continue statin therapy Encouraged CPAP use Blood pressure control is excellent Continue Eliquis.  Watch for bleeding complications.   Medication Adjustments/Labs and Tests Ordered: Current medicines are reviewed at length with the patient today.  Concerns regarding medicines are outlined above.  No orders of the defined types were placed in this encounter.  No orders  of the defined types were placed in this encounter.   Patient Instructions  Medication  Instructions:  Your physician recommends that you continue on your current medications as directed. Please refer to the Current Medication list given to you today.  *If you need a refill on your cardiac medications before your next appointment, please call your pharmacy*  Follow-Up: At [facility], you and your health needs are our priority.  As part of our continuing mission to provide you with exceptional heart care, we have created designated Provider Care Teams.  These Care Teams include your primary Cardiologist (physician) and Advanced Practice Providers (APPs -  Physician Assistants and Nurse Practitioners) who all work together to provide you with the care you need, when you need it.  Your next appointment:   6-7 month(s)  The format for your next appointment:   In Person  Provider:   Candee Furbish, MD or Rudean Haskell, MD or Gwyndolyn Kaufman, MD  Important Information About Sugar         Signed, [name], MD  08/15/2022 2:59 PM    [place]

## 2022-08-15 ENCOUNTER — Telehealth: Payer: Self-pay | Admitting: Hematology and Oncology

## 2022-08-15 ENCOUNTER — Encounter: Payer: Self-pay | Admitting: Interventional Cardiology

## 2022-08-15 ENCOUNTER — Ambulatory Visit: Payer: Medicare HMO | Attending: Interventional Cardiology | Admitting: Interventional Cardiology

## 2022-08-15 VITALS — BP 112/60 | HR 65 | Ht 71.0 in | Wt 262.0 lb

## 2022-08-15 DIAGNOSIS — Z7901 Long term (current) use of anticoagulants: Secondary | ICD-10-CM

## 2022-08-15 DIAGNOSIS — I10 Essential (primary) hypertension: Secondary | ICD-10-CM | POA: Diagnosis not present

## 2022-08-15 DIAGNOSIS — I5032 Chronic diastolic (congestive) heart failure: Secondary | ICD-10-CM

## 2022-08-15 DIAGNOSIS — I4819 Other persistent atrial fibrillation: Secondary | ICD-10-CM | POA: Diagnosis not present

## 2022-08-15 DIAGNOSIS — G4733 Obstructive sleep apnea (adult) (pediatric): Secondary | ICD-10-CM | POA: Diagnosis not present

## 2022-08-15 DIAGNOSIS — E785 Hyperlipidemia, unspecified: Secondary | ICD-10-CM | POA: Diagnosis not present

## 2022-08-15 NOTE — Telephone Encounter (Signed)
Called patient to notify of upcoming appointments. Left voicemail with appointment information

## 2022-08-15 NOTE — Patient Instructions (Signed)
Medication Instructions:  Your physician recommends that you continue on your current medications as directed. Please refer to the Current Medication list given to you today.  *If you need a refill on your cardiac medications before your next appointment, please call your pharmacy*  Follow-Up: At [facility], you and your health needs are our priority.  As part of our continuing mission to provide you with exceptional heart care, we have created designated Provider Care Teams.  These Care Teams include your primary Cardiologist (physician) and Advanced Practice Providers (APPs -  Physician Assistants and Nurse Practitioners) who all work together to provide you with the care you need, when you need it.  Your next appointment:   6-7 month(s)  The format for your next appointment:   In Person  Provider:   Candee Furbish, MD or Rudean Haskell, MD or Gwyndolyn Kaufman, MD  Important Information About Sugar

## 2022-08-16 DIAGNOSIS — R946 Abnormal results of thyroid function studies: Secondary | ICD-10-CM | POA: Diagnosis not present

## 2022-08-16 DIAGNOSIS — Z8673 Personal history of transient ischemic attack (TIA), and cerebral infarction without residual deficits: Secondary | ICD-10-CM | POA: Diagnosis not present

## 2022-08-16 DIAGNOSIS — K592 Neurogenic bowel, not elsewhere classified: Secondary | ICD-10-CM | POA: Diagnosis not present

## 2022-08-16 DIAGNOSIS — Z23 Encounter for immunization: Secondary | ICD-10-CM | POA: Diagnosis not present

## 2022-08-16 DIAGNOSIS — C4490 Unspecified malignant neoplasm of skin, unspecified: Secondary | ICD-10-CM | POA: Diagnosis not present

## 2022-08-16 DIAGNOSIS — I480 Paroxysmal atrial fibrillation: Secondary | ICD-10-CM | POA: Diagnosis not present

## 2022-08-16 DIAGNOSIS — N1831 Chronic kidney disease, stage 3a: Secondary | ICD-10-CM | POA: Diagnosis not present

## 2022-08-16 DIAGNOSIS — I700 Atherosclerosis of aorta: Secondary | ICD-10-CM | POA: Diagnosis not present

## 2022-08-16 DIAGNOSIS — Z7901 Long term (current) use of anticoagulants: Secondary | ICD-10-CM | POA: Diagnosis not present

## 2022-08-16 DIAGNOSIS — I130 Hypertensive heart and chronic kidney disease with heart failure and stage 1 through stage 4 chronic kidney disease, or unspecified chronic kidney disease: Secondary | ICD-10-CM | POA: Diagnosis not present

## 2022-08-16 DIAGNOSIS — I5032 Chronic diastolic (congestive) heart failure: Secondary | ICD-10-CM | POA: Diagnosis not present

## 2022-08-16 DIAGNOSIS — E1140 Type 2 diabetes mellitus with diabetic neuropathy, unspecified: Secondary | ICD-10-CM | POA: Diagnosis not present

## 2022-08-24 DIAGNOSIS — M25552 Pain in left hip: Secondary | ICD-10-CM | POA: Diagnosis not present

## 2022-08-28 ENCOUNTER — Encounter (HOSPITAL_BASED_OUTPATIENT_CLINIC_OR_DEPARTMENT_OTHER): Payer: Self-pay | Admitting: Emergency Medicine

## 2022-08-28 ENCOUNTER — Inpatient Hospital Stay (HOSPITAL_BASED_OUTPATIENT_CLINIC_OR_DEPARTMENT_OTHER)
Admission: EM | Admit: 2022-08-28 | Discharge: 2022-08-31 | DRG: 178 | Disposition: A | Discharge status: Home Health | Payer: Medicare HMO | Attending: Internal Medicine | Admitting: Internal Medicine

## 2022-08-28 ENCOUNTER — Emergency Department (HOSPITAL_BASED_OUTPATIENT_CLINIC_OR_DEPARTMENT_OTHER): Payer: Medicare HMO

## 2022-08-28 ENCOUNTER — Encounter (HOSPITAL_COMMUNITY): Payer: Self-pay

## 2022-08-28 ENCOUNTER — Other Ambulatory Visit: Payer: Self-pay

## 2022-08-28 DIAGNOSIS — Z87891 Personal history of nicotine dependence: Secondary | ICD-10-CM

## 2022-08-28 DIAGNOSIS — E876 Hypokalemia: Secondary | ICD-10-CM | POA: Diagnosis not present

## 2022-08-28 DIAGNOSIS — E669 Obesity, unspecified: Secondary | ICD-10-CM | POA: Diagnosis present

## 2022-08-28 DIAGNOSIS — E039 Hypothyroidism, unspecified: Secondary | ICD-10-CM | POA: Diagnosis present

## 2022-08-28 DIAGNOSIS — Z8249 Family history of ischemic heart disease and other diseases of the circulatory system: Secondary | ICD-10-CM

## 2022-08-28 DIAGNOSIS — I10 Essential (primary) hypertension: Secondary | ICD-10-CM | POA: Diagnosis not present

## 2022-08-28 DIAGNOSIS — Z794 Long term (current) use of insulin: Secondary | ICD-10-CM | POA: Diagnosis not present

## 2022-08-28 DIAGNOSIS — F32A Depression, unspecified: Secondary | ICD-10-CM | POA: Diagnosis not present

## 2022-08-28 DIAGNOSIS — E119 Type 2 diabetes mellitus without complications: Secondary | ICD-10-CM

## 2022-08-28 DIAGNOSIS — U071 COVID-19: Principal | ICD-10-CM | POA: Diagnosis present

## 2022-08-28 DIAGNOSIS — E1151 Type 2 diabetes mellitus with diabetic peripheral angiopathy without gangrene: Secondary | ICD-10-CM | POA: Diagnosis not present

## 2022-08-28 DIAGNOSIS — D539 Nutritional anemia, unspecified: Secondary | ICD-10-CM | POA: Diagnosis not present

## 2022-08-28 DIAGNOSIS — G4733 Obstructive sleep apnea (adult) (pediatric): Secondary | ICD-10-CM | POA: Diagnosis present

## 2022-08-28 DIAGNOSIS — Z7984 Long term (current) use of oral hypoglycemic drugs: Secondary | ICD-10-CM

## 2022-08-28 DIAGNOSIS — Z9181 History of falling: Secondary | ICD-10-CM

## 2022-08-28 DIAGNOSIS — R079 Chest pain, unspecified: Secondary | ICD-10-CM | POA: Diagnosis not present

## 2022-08-28 DIAGNOSIS — Z6836 Body mass index (BMI) 36.0-36.9, adult: Secondary | ICD-10-CM | POA: Diagnosis not present

## 2022-08-28 DIAGNOSIS — M1990 Unspecified osteoarthritis, unspecified site: Secondary | ICD-10-CM | POA: Diagnosis not present

## 2022-08-28 DIAGNOSIS — E1141 Type 2 diabetes mellitus with diabetic mononeuropathy: Secondary | ICD-10-CM | POA: Diagnosis present

## 2022-08-28 DIAGNOSIS — F419 Anxiety disorder, unspecified: Secondary | ICD-10-CM | POA: Diagnosis not present

## 2022-08-28 DIAGNOSIS — E1165 Type 2 diabetes mellitus with hyperglycemia: Secondary | ICD-10-CM | POA: Diagnosis not present

## 2022-08-28 DIAGNOSIS — N400 Enlarged prostate without lower urinary tract symptoms: Secondary | ICD-10-CM | POA: Diagnosis present

## 2022-08-28 DIAGNOSIS — E871 Hypo-osmolality and hyponatremia: Secondary | ICD-10-CM | POA: Diagnosis not present

## 2022-08-28 DIAGNOSIS — E11319 Type 2 diabetes mellitus with unspecified diabetic retinopathy without macular edema: Secondary | ICD-10-CM | POA: Diagnosis present

## 2022-08-28 DIAGNOSIS — I110 Hypertensive heart disease with heart failure: Secondary | ICD-10-CM | POA: Diagnosis present

## 2022-08-28 DIAGNOSIS — I4821 Permanent atrial fibrillation: Secondary | ICD-10-CM | POA: Diagnosis present

## 2022-08-28 DIAGNOSIS — R531 Weakness: Secondary | ICD-10-CM | POA: Diagnosis present

## 2022-08-28 DIAGNOSIS — T380X5A Adverse effect of glucocorticoids and synthetic analogues, initial encounter: Secondary | ICD-10-CM | POA: Diagnosis not present

## 2022-08-28 DIAGNOSIS — I5032 Chronic diastolic (congestive) heart failure: Secondary | ICD-10-CM | POA: Diagnosis not present

## 2022-08-28 DIAGNOSIS — Z7901 Long term (current) use of anticoagulants: Secondary | ICD-10-CM

## 2022-08-28 DIAGNOSIS — Z96653 Presence of artificial knee joint, bilateral: Secondary | ICD-10-CM | POA: Diagnosis present

## 2022-08-28 DIAGNOSIS — K219 Gastro-esophageal reflux disease without esophagitis: Secondary | ICD-10-CM | POA: Diagnosis present

## 2022-08-28 DIAGNOSIS — Z85828 Personal history of other malignant neoplasm of skin: Secondary | ICD-10-CM

## 2022-08-28 DIAGNOSIS — Z880 Allergy status to penicillin: Secondary | ICD-10-CM

## 2022-08-28 DIAGNOSIS — I480 Paroxysmal atrial fibrillation: Secondary | ICD-10-CM | POA: Diagnosis present

## 2022-08-28 DIAGNOSIS — Z833 Family history of diabetes mellitus: Secondary | ICD-10-CM

## 2022-08-28 DIAGNOSIS — E785 Hyperlipidemia, unspecified: Secondary | ICD-10-CM | POA: Diagnosis not present

## 2022-08-28 DIAGNOSIS — I482 Chronic atrial fibrillation, unspecified: Secondary | ICD-10-CM | POA: Diagnosis not present

## 2022-08-28 DIAGNOSIS — R296 Repeated falls: Secondary | ICD-10-CM | POA: Diagnosis not present

## 2022-08-28 DIAGNOSIS — R102 Pelvic and perineal pain: Secondary | ICD-10-CM | POA: Diagnosis not present

## 2022-08-28 DIAGNOSIS — K5909 Other constipation: Secondary | ICD-10-CM | POA: Diagnosis present

## 2022-08-28 DIAGNOSIS — Z79890 Hormone replacement therapy (postmenopausal): Secondary | ICD-10-CM

## 2022-08-28 DIAGNOSIS — Z8673 Personal history of transient ischemic attack (TIA), and cerebral infarction without residual deficits: Secondary | ICD-10-CM

## 2022-08-28 DIAGNOSIS — Z79899 Other long term (current) drug therapy: Secondary | ICD-10-CM

## 2022-08-28 DIAGNOSIS — S2231XA Fracture of one rib, right side, initial encounter for closed fracture: Secondary | ICD-10-CM | POA: Diagnosis not present

## 2022-08-28 LAB — CBC WITH DIFFERENTIAL/PLATELET
Abs Immature Granulocytes: 0.04 10*3/uL (ref 0.00–0.07)
Basophils Absolute: 0 10*3/uL (ref 0.0–0.1)
Basophils Relative: 1 %
Eosinophils Absolute: 0 10*3/uL (ref 0.0–0.5)
Eosinophils Relative: 0 %
HCT: 29.4 % — ABNORMAL LOW (ref 39.0–52.0)
Hemoglobin: 10.3 g/dL — ABNORMAL LOW (ref 13.0–17.0)
Immature Granulocytes: 1 %
Lymphocytes Relative: 7 %
Lymphs Abs: 0.4 10*3/uL — ABNORMAL LOW (ref 0.7–4.0)
MCH: 43.5 pg — ABNORMAL HIGH (ref 26.0–34.0)
MCHC: 35 g/dL (ref 30.0–36.0)
MCV: 124.1 fL — ABNORMAL HIGH (ref 80.0–100.0)
Monocytes Absolute: 0.8 10*3/uL (ref 0.1–1.0)
Monocytes Relative: 12 %
Neutro Abs: 4.9 10*3/uL (ref 1.7–7.7)
Neutrophils Relative %: 79 %
Platelets: 543 10*3/uL — ABNORMAL HIGH (ref 150–400)
RBC: 2.37 MIL/uL — ABNORMAL LOW (ref 4.22–5.81)
RDW: 14.6 % (ref 11.5–15.5)
Smear Review: NORMAL
WBC: 6.1 10*3/uL (ref 4.0–10.5)
nRBC: 0.5 % — ABNORMAL HIGH (ref 0.0–0.2)

## 2022-08-28 LAB — RESP PANEL BY RT-PCR (RSV, FLU A&B, COVID)  RVPGX2
Influenza A by PCR: NEGATIVE
Influenza B by PCR: NEGATIVE
Resp Syncytial Virus by PCR: NEGATIVE
SARS Coronavirus 2 by RT PCR: POSITIVE — AB

## 2022-08-28 LAB — COMPREHENSIVE METABOLIC PANEL
ALT: 49 U/L — ABNORMAL HIGH (ref 0–44)
AST: 175 U/L — ABNORMAL HIGH (ref 15–41)
Albumin: 3.5 g/dL (ref 3.5–5.0)
Alkaline Phosphatase: 90 U/L (ref 38–126)
Anion gap: 6 (ref 5–15)
BUN: 31 mg/dL — ABNORMAL HIGH (ref 8–23)
CO2: 31 mmol/L (ref 22–32)
Calcium: 9 mg/dL (ref 8.9–10.3)
Chloride: 96 mmol/L — ABNORMAL LOW (ref 98–111)
Creatinine, Ser: 1.53 mg/dL — ABNORMAL HIGH (ref 0.61–1.24)
GFR, Estimated: 46 mL/min — ABNORMAL LOW (ref >60)
Glucose, Bld: 133 mg/dL — ABNORMAL HIGH (ref 70–99)
Potassium: 3.5 mmol/L (ref 3.5–5.1)
Sodium: 133 mmol/L — ABNORMAL LOW (ref 135–145)
Total Bilirubin: 1.1 mg/dL (ref 0.3–1.2)
Total Protein: 6.6 g/dL (ref 6.5–8.1)

## 2022-08-28 LAB — URINALYSIS, ROUTINE W REFLEX MICROSCOPIC
Bilirubin Urine: NEGATIVE
Glucose, UA: NEGATIVE mg/dL
Ketones, ur: NEGATIVE mg/dL
Leukocytes,Ua: NEGATIVE
Nitrite: NEGATIVE
Protein, ur: 30 mg/dL — AB
Specific Gravity, Urine: 1.015 (ref 1.005–1.030)
pH: 8.5 — ABNORMAL HIGH (ref 5.0–8.0)

## 2022-08-28 LAB — PROCALCITONIN: Procalcitonin: <0.1 ng/mL

## 2022-08-28 LAB — URINALYSIS, MICROSCOPIC (REFLEX)

## 2022-08-28 LAB — D-DIMER, QUANTITATIVE: D-Dimer, Quant: 0.4 ug/mL-FEU (ref 0.00–0.50)

## 2022-08-28 LAB — TROPONIN I (HIGH SENSITIVITY)
Troponin I (High Sensitivity): 30 ng/L — ABNORMAL HIGH (ref <18)
Troponin I (High Sensitivity): 32 ng/L — ABNORMAL HIGH (ref <18)

## 2022-08-28 LAB — C-REACTIVE PROTEIN: CRP: 1.7 mg/dL — ABNORMAL HIGH (ref <1.0)

## 2022-08-28 LAB — GLUCOSE, CAPILLARY
Glucose-Capillary: 326 mg/dL — ABNORMAL HIGH (ref 70–99)
Glucose-Capillary: 505 mg/dL (ref 70–99)
Glucose-Capillary: 568 mg/dL (ref 70–99)

## 2022-08-28 MED ORDER — ADULT MULTIVITAMIN W/MINERALS CH
1.0000 | ORAL_TABLET | Freq: Every day | ORAL | Status: DC
  Administered 2022-08-29 – 2022-08-31 (×3): 1 via ORAL
  Filled 2022-08-28 (×3): qty 1

## 2022-08-28 MED ORDER — MOLNUPIRAVIR EUA 200MG CAPSULE
4.0000 | ORAL_CAPSULE | Freq: Two times a day (BID) | ORAL | Status: DC
  Administered 2022-08-28 – 2022-08-31 (×6): 800 mg via ORAL
  Filled 2022-08-28: qty 4

## 2022-08-28 MED ORDER — ATORVASTATIN CALCIUM 40 MG PO TABS
80.0000 mg | ORAL_TABLET | Freq: Every day | ORAL | Status: DC
  Administered 2022-08-28 – 2022-08-30 (×3): 80 mg via ORAL
  Filled 2022-08-28 (×3): qty 2

## 2022-08-28 MED ORDER — PANTOPRAZOLE SODIUM 40 MG PO TBEC
40.0000 mg | DELAYED_RELEASE_TABLET | Freq: Every day | ORAL | Status: DC
  Administered 2022-08-29 – 2022-08-31 (×3): 40 mg via ORAL
  Filled 2022-08-28 (×3): qty 1

## 2022-08-28 MED ORDER — METHYLPREDNISOLONE SODIUM SUCC 40 MG IJ SOLR: 40.0000 mg | Freq: Every day | INTRAMUSCULAR | Status: DC

## 2022-08-28 MED ORDER — ZINC SULFATE 220 (50 ZN) MG PO CAPS
220.0000 mg | ORAL_CAPSULE | Freq: Every day | ORAL | Status: DC
  Administered 2022-08-28 – 2022-08-31 (×4): 220 mg via ORAL
  Filled 2022-08-28 (×4): qty 1

## 2022-08-28 MED ORDER — LEVOTHYROXINE SODIUM 75 MCG PO TABS
75.0000 ug | ORAL_TABLET | Freq: Every day | ORAL | Status: DC
  Administered 2022-08-29 – 2022-08-31 (×3): 75 ug via ORAL
  Filled 2022-08-28 (×3): qty 1

## 2022-08-28 MED ORDER — INSULIN ASPART 100 UNIT/ML IJ SOLN
7.0000 [IU] | Freq: Once | INTRAMUSCULAR | Status: AC
  Administered 2022-08-28: 7 [IU] via SUBCUTANEOUS

## 2022-08-28 MED ORDER — ONDANSETRON HCL 4 MG/2ML IJ SOLN: 4.0000 mg | Freq: Four times a day (QID) | INTRAMUSCULAR | Status: DC | PRN

## 2022-08-28 MED ORDER — VENLAFAXINE HCL ER 75 MG PO CP24: 75.0000 mg | ORAL_CAPSULE | Freq: Every day | ORAL | Status: DC

## 2022-08-28 MED ORDER — ACETAMINOPHEN 650 MG RE SUPP: 650.0000 mg | Freq: Four times a day (QID) | RECTAL | Status: DC | PRN

## 2022-08-28 MED ORDER — POLYETHYLENE GLYCOL 3350 17 G PO PACK: 17.0000 g | PACK | Freq: Every day | ORAL | Status: DC | PRN

## 2022-08-28 MED ORDER — VENLAFAXINE HCL ER 150 MG PO CP24
150.0000 mg | ORAL_CAPSULE | Freq: Every day | ORAL | Status: DC
  Administered 2022-08-29 – 2022-08-31 (×3): 150 mg via ORAL
  Filled 2022-08-28 (×3): qty 1

## 2022-08-28 MED ORDER — GUAIFENESIN-DM 100-10 MG/5ML PO SYRP
10.0000 mL | ORAL_SOLUTION | ORAL | Status: DC | PRN
  Administered 2022-08-29: 10 mL via ORAL
  Filled 2022-08-28: qty 10

## 2022-08-28 MED ORDER — METHYLPREDNISOLONE SODIUM SUCC 125 MG IJ SOLR
125.0000 mg | Freq: Once | INTRAMUSCULAR | Status: AC
  Administered 2022-08-28: 125 mg via INTRAVENOUS
  Filled 2022-08-28: qty 2

## 2022-08-28 MED ORDER — ONDANSETRON HCL 4 MG PO TABS: 4.0000 mg | ORAL_TABLET | Freq: Four times a day (QID) | ORAL | Status: DC | PRN

## 2022-08-28 MED ORDER — METOPROLOL TARTRATE 50 MG PO TABS
50.0000 mg | ORAL_TABLET | Freq: Two times a day (BID) | ORAL | Status: DC
  Administered 2022-08-28 – 2022-08-31 (×3): 50 mg via ORAL
  Filled 2022-08-28 (×6): qty 1

## 2022-08-28 MED ORDER — NIRMATRELVIR/RITONAVIR (PAXLOVID) TABLET (RENAL DOSING): 2.0000 | ORAL_TABLET | Freq: Two times a day (BID) | ORAL | Status: DC

## 2022-08-28 MED ORDER — ACETAMINOPHEN 325 MG PO TABS: 650.0000 mg | ORAL_TABLET | Freq: Four times a day (QID) | ORAL | Status: DC | PRN

## 2022-08-28 MED ORDER — TAMSULOSIN HCL 0.4 MG PO CAPS
0.4000 mg | ORAL_CAPSULE | Freq: Every day | ORAL | Status: DC
  Administered 2022-08-28 – 2022-08-31 (×4): 0.4 mg via ORAL
  Filled 2022-08-28 (×4): qty 1

## 2022-08-28 MED ORDER — INSULIN ASPART 100 UNIT/ML IJ SOLN
0.0000 [IU] | Freq: Every day | INTRAMUSCULAR | Status: DC
  Administered 2022-08-28: 5 [IU] via SUBCUTANEOUS
  Administered 2022-08-29 – 2022-08-30 (×2): 3 [IU] via SUBCUTANEOUS

## 2022-08-28 MED ORDER — IPRATROPIUM-ALBUTEROL 20-100 MCG/ACT IN AERS
1.0000 | INHALATION_SPRAY | Freq: Four times a day (QID) | RESPIRATORY_TRACT | Status: DC
  Administered 2022-08-29: 1 via RESPIRATORY_TRACT
  Filled 2022-08-28: qty 4

## 2022-08-28 MED ORDER — SODIUM CHLORIDE 0.9 % IV BOLUS
500.0000 mL | Freq: Once | INTRAVENOUS | Status: AC
  Administered 2022-08-28: 500 mL via INTRAVENOUS

## 2022-08-28 MED ORDER — VITAMIN C 500 MG PO TABS
500.0000 mg | ORAL_TABLET | Freq: Every day | ORAL | Status: DC
  Administered 2022-08-28 – 2022-08-31 (×4): 500 mg via ORAL
  Filled 2022-08-28 (×4): qty 1

## 2022-08-28 MED ORDER — TOPIRAMATE 25 MG PO TABS
50.0000 mg | ORAL_TABLET | Freq: Every day | ORAL | Status: DC
  Administered 2022-08-28 – 2022-08-30 (×3): 50 mg via ORAL
  Filled 2022-08-28 (×3): qty 2

## 2022-08-28 MED ORDER — METHYLPREDNISOLONE SODIUM SUCC 40 MG IJ SOLR
40.0000 mg | Freq: Every day | INTRAMUSCULAR | Status: AC
  Administered 2022-08-29 – 2022-08-31 (×3): 40 mg via INTRAVENOUS
  Filled 2022-08-28 (×3): qty 1

## 2022-08-28 MED ORDER — INSULIN ASPART PROT & ASPART (70-30 MIX) 100 UNIT/ML ~~LOC~~ SUSP
44.0000 [IU] | Freq: Two times a day (BID) | SUBCUTANEOUS | Status: DC
  Filled 2022-08-28: qty 10

## 2022-08-28 MED ORDER — INSULIN ASPART PROT & ASPART (70-30 MIX) 100 UNIT/ML ~~LOC~~ SUSP
26.0000 [IU] | Freq: Two times a day (BID) | SUBCUTANEOUS | Status: DC
  Administered 2022-08-28 – 2022-08-31 (×6): 26 [IU] via SUBCUTANEOUS

## 2022-08-28 MED ORDER — SENNOSIDES-DOCUSATE SODIUM 8.6-50 MG PO TABS
2.0000 | ORAL_TABLET | Freq: Every day | ORAL | Status: DC
  Administered 2022-08-28 – 2022-08-30 (×3): 2 via ORAL
  Filled 2022-08-28 (×3): qty 2

## 2022-08-28 MED ORDER — ALBUTEROL SULFATE HFA 108 (90 BASE) MCG/ACT IN AERS: 2.0000 | INHALATION_SPRAY | RESPIRATORY_TRACT | Status: DC | PRN

## 2022-08-28 MED ORDER — INSULIN ASPART 100 UNIT/ML IJ SOLN
0.0000 [IU] | Freq: Three times a day (TID) | INTRAMUSCULAR | Status: DC
  Administered 2022-08-29: 15 [IU] via SUBCUTANEOUS
  Administered 2022-08-29: 11 [IU] via SUBCUTANEOUS
  Administered 2022-08-29: 20 [IU] via SUBCUTANEOUS
  Administered 2022-08-30: 15 [IU] via SUBCUTANEOUS
  Administered 2022-08-30: 20 [IU] via SUBCUTANEOUS
  Administered 2022-08-30 – 2022-08-31 (×2): 4 [IU] via SUBCUTANEOUS

## 2022-08-28 MED ORDER — APIXABAN 5 MG PO TABS
5.0000 mg | ORAL_TABLET | Freq: Two times a day (BID) | ORAL | Status: DC
  Administered 2022-08-28 – 2022-08-31 (×6): 5 mg via ORAL
  Filled 2022-08-28 (×6): qty 1

## 2022-08-28 MED ORDER — INSULIN ASPART 100 UNIT/ML IJ SOLN: 0.0000 [IU] | Freq: Three times a day (TID) | INTRAMUSCULAR | Status: DC

## 2022-08-28 MED ORDER — TORSEMIDE 20 MG PO TABS
60.0000 mg | ORAL_TABLET | Freq: Every day | ORAL | Status: DC
  Administered 2022-08-29 – 2022-08-31 (×3): 60 mg via ORAL
  Filled 2022-08-28 (×3): qty 3

## 2022-08-28 MED ORDER — ALPRAZOLAM 0.5 MG PO TABS
0.5000 mg | ORAL_TABLET | Freq: Two times a day (BID) | ORAL | Status: DC | PRN
  Administered 2022-08-28 – 2022-08-30 (×3): 0.5 mg via ORAL
  Filled 2022-08-28 (×3): qty 1

## 2022-08-28 NOTE — ED Notes (Signed)
Pt provided with urinal after calling out that he needed to use bathroom. Pt informs this RN that he wet his brief. Primary RN updated, will attempt to go in with a tech and change patients brief

## 2022-08-28 NOTE — ED Triage Notes (Signed)
PT to ER with c/o onset weakness this AM with fall x 2.  Pt had EMS to home to help him up after fall.  Pt c/o right rib pain.  Pt also reports COVID pos at home this AM.

## 2022-08-28 NOTE — H&P (Addendum)
History and Physical  [name] FKC:[id number] DOB: 01-Nov-1940 DOA: 08/28/2022  Referring physician: Accepted by Dr. Lorin Mercy [facility], hospitalist service. PCP: [name], MD  Outpatient Specialists: Cardiology, oncology, neurology. Patient coming from: Home.  Chief Complaint: Generalized weakness, falls x 2.  HPI: [name] is a 81 y.o. male with medical history significant for persistent atrial fibrillation on Eliquis, chronic diastolic CHF, prior CVA, OSA not on CPAP, hypertension, type 2 diabetes, peripheral artery disease, peripheral vascular disease, hyperlipidemia, morbid obesity, who presented to [facility] ED from home due to generalized weakness and falls x 2.  Started feeling weak early this morning around 2 AM while ambulating to his bathroom.  Associated with being off balance.  EMS was activated and he was brought to the ED.  In the ED, he was found to be COVID-19 positive.  He received 500 cc normal saline bolus and 125 mg of IV Solu-Medrol.  He had elevation in his troponin.  Denies any chest pain.  CT head and cervical spine were nonacute.  In the ED, the patient was unable to ambulate.  Due to his generalized weakness EDP requested admission.  The patient was accepted by Dr. Lorin Mercy, [facility], hospitalist service and transferred to [facility] telemetry unit as inpatient status.  ED Course: Tmax 100.3.  BP 159/79, pulse 91, respiration 18, O2 saturation 98% on 2 L.  1 documented reading of 83% on room air.  Lab studies remarkable for serum sodium 133, glucose 133 BUN 31, creatinine 1.53, AST 175, ALT 49, GFR 46.  High-sensitivity troponin 30, 32.  Hemoglobin 10.3.  Platelet count 543.  Review of Systems: Review of systems as noted in the HPI. All other systems reviewed and are negative.   Past Medical History:  Diagnosis Date   Anxiety    Arthritis    Basal cell carcinoma 07/18/1991   Left nasal brdige (MOHS)   Basal cell carcinoma 01/23/1992   lower right  back-(CX35FU)   Basal cell carcinoma 05/20/2003   sup-left back (CX35FU)   Basal cell carcinoma 07/28/2011   post lower neck   Basal cell carcinoma 08/20/2008   right sideburn(MOHS), sup-Left upper back (CX35FU), sup-mid back (CX35FU), nod-Right lower back )CX35FU), nod-right upperarm (CX35FU)   Basal cell carcinoma 06/08/2016   sup-Left upper back (CX35FU), mid back (CX35FU), right lower back (CX35FU), nod-Right upperarm (CX35FU)   Depression    Diabetes mellitus without complication (HCC)    GERD (gastroesophageal reflux disease)    Hyperlipidemia    Hypertension    Neuropathy    Obesity    Paroxysmal atrial fibrillation (HCC)    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 (in situ)   Sleep apnea    uses C-pap machine   Squamous cell carcinoma of skin 02/11/2013   in situ-Right temple (CX35FU)   Squamous cell carcinoma of skin 08/20/2008   in situ- front scalp (CX35FU)   Squamous cell carcinoma of skin 06/08/2016   in situ-front scalp (CX35FU)   Squamous cell carcinoma of skin 02/05/2019   in situ-right sideburn-sup (CX35FU), in situ-right sideburn,inf (CX35FU)   Stroke (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 His smoking use included cigars and cigarettes. He has a 30.00 pack-year smoking history. He has never used smokeless tobacco. He reports current alcohol use. He reports that he does not use drugs.   Allergies  Allergen Reactions   Penicillin G Sodium Other (See Comments)    Patient was told as a child he is allergic    Family History  Problem Relation Age of Onset   Cancer Mother    Diabetes Mellitus II Mother    Hypertension Mother    Heart failure Father    CVA Father    Hypertension Sister    Colon cancer Neg Hx       Prior to Admission medications   Medication Sig Start Date End Date Taking? Authorizing Provider  ALPRAZolam (Duanne Moron 0.5 MG tablet Take 0.5 mg by mouth 2 (two) times daily.   Yes [provider]  apixaban (ELIQUIS) 5 MG TABS tablet Take 1 tablet by mouth twice daily Patient taking differently: Take 5 mg by mouth 2 (two) times daily. 02/21/22  Yes SBelva Crome MD  atorvastatin (LIPITOR) 80 MG tablet Take 1 tablet (80 mg total) by mouth daily at 6 PM. Patient taking differently: Take 80 mg by mouth at bedtime. 08/11/17  Yes NMariel Aloe MD  ergocalciferol (VITAMIN D2) 50000 units capsule Take 50,000 Units by mouth every Sunday.   Yes [provider]  gabapentin (NEURONTIN) 600 MG tablet Take 600 mg by mouth every evening. 05/06/22  Yes [provider]  hydroxyurea (HYDREA) 500 MG capsule Take 500 mg by mouth in the morning and at bedtime. May take with food to minimize GI side effects.   Yes [provider]  levothyroxine (SYNTHROID) 75 MCG tablet Take 75 mcg by mouth daily before breakfast. 03/31/20  Yes [provider]  metFORMIN (GLUCOPHAGE) 1000 MG tablet Take 500 mg by mouth in the morning and at bedtime. 08/27/18  Yes  [provider]  metolazone (ZAROXOLYN) 2.5 MG tablet Take 1 tablet (2.5 mg total) by mouth 2 (two) times a week. Take on Tuesdays and Fridays 30 minutes before taking Torsemide. Patient taking differently: Take 2.5 mg by mouth See admin instructions. Take 2.5 mg by mouth on Tuesdays and Fridays- 30 minutes before taking Torsemide 03/21/22  Yes SBelva Crome MD  metoprolol tartrate (LOPRESSOR) 50 MG tablet Take 50 mg by mouth 2 (two) times daily. 05/13/19  Yes [provider]  Multiple Vitamin (MULTIVITAMIN WITH MINERALS) TABS tablet Take 1 tablet by mouth daily with breakfast.   Yes [provider]  NOVOLIN 70/30 RELION (70-30) 100 UNIT/ML injection Inject 44 Units into the skin in the morning and at bedtime.   Yes [provider]  omeprazole (PRILOSEC OTC) 20 MG tablet Take 20 mg by mouth  daily before breakfast.   Yes [provider]  polyethylene glycol powder (MIRALAX) 17 GM/SCOOP powder Please take 6 capfuls of MiraLAX in a 16 oz bottle of Gatorade over 2-4 hour period. The following day take 3 capfuls. On day 3 start taking 1 capful 3 times a day. Slowly cut back as needed until you have normal bowel movements. Patient taking differently: Take 17 g by mouth daily as needed for mild constipation (mix and drink as directed). 02/17/22  Yes Cardama, Grayce Sessions, MD  potassium chloride (KLOR-CON) 10 MEQ tablet Take 10 mEq by mouth daily.   Yes [provider]  tamsulosin (FLOMAX) 0.4 MG CAPS capsule Take 0.4 mg by mouth daily. 12/06/20  Yes [provider]  topiramate (TOPAMAX) 50 MG tablet TAKE 1 TABLET BY MOUTH AT BEDTIME ,  MAY  REPEAT  DOSE  ONE TIME  IF  NEEDED Patient taking differently: Take 50 mg by mouth at bedtime. 06/06/22  Yes McCue, Janett Billow, NP  torsemide (DEMADEX) 20 MG tablet Take 3 tablets by mouth once daily Patient taking differently: Take 60 mg by mouth in the morning. 06/20/22  Yes Belva Crome, MD  venlafaxine XR  (EFFEXOR-XR) 150 MG 24 hr capsule Take 150 mg by mouth daily with breakfast.   Yes [provider]  glucose blood (ONETOUCH ULTRA) test strip USE 1 STRIP TO CHECK GLUCOSE 4 TIMES DAILY 02/18/20   [provider]  insulin NPH-regular Human (NOVOLIN 70/30) (70-30) 100 UNIT/ML injection Inject 44 Units into the skin 2 (two) times daily with a meal. Patient not taking: Reported on 08/28/2022    [provider]  omeprazole (PRILOSEC) 20 MG capsule Take 20 mg by mouth daily. Patient not taking: Reported on 08/28/2022    [provider]  [facility] VERIO test strip SMARTSIG:Via Meter 07/02/20   [provider]  venlafaxine XR (EFFEXOR-XR) 75 MG 24 hr capsule Take 75 mg by mouth daily. Patient not taking: Reported on 08/28/2022    [provider]    Physical Exam: BP (!) 159/79 (BP Location: Left Arm)   Pulse 91   Temp 98.3 F (36.8 C) (Oral)   Resp 18   Ht '5\' 11"'$  (1.803 m)   Wt 118.8 kg   SpO2 98%   BMI 36.53 kg/m   General: 81 y.o. year-old male well developed well nourished in no acute distress.  Alert and oriented x3. Cardiovascular: Irregular rate and rhythm with no rubs or gallops.  No thyromegaly or JVD noted.  1+ pitting edema in lower extremities bilaterally.   Respiratory: Clear to auscultation with no wheezes or rales. Good inspiratory effort. Abdomen: Soft nontender nondistended with normal bowel sounds x4 quadrants. Muskuloskeletal: No cyanosis or clubbing.  1+ pitting edema in lower extremities bilaterally.   Neuro: CN II-XII intact, strength, sensation, reflexes Skin: No ulcerative lesions noted or rashes Psychiatry: Judgement and insight appear normal. Mood is appropriate for condition and setting          Labs on Admission:  Basic Metabolic Panel: Recent Labs  Lab 08/28/22 1007  NA 133*  K 3.5  CL 96*  CO2 31  GLUCOSE 133*  BUN 31*  CREATININE 1.53*  CALCIUM 9.0   Liver Function Tests: Recent Labs  Lab  08/28/22 1007  AST 175*  ALT 49*  ALKPHOS 90  BILITOT 1.1  PROT 6.6  ALBUMIN 3.5   No results for input(s): "LIPASE", "AMYLASE" in the last 168 hours. No results for input(s): "AMMONIA" in the  last 168 hours. CBC: Recent Labs  Lab 08/28/22 1007  WBC 6.1  NEUTROABS 4.9  HGB 10.3*  HCT 29.4*  MCV 124.1*  PLT 543*   Cardiac Enzymes: No results for input(s): "CKTOTAL", "CKMB", "CKMBINDEX", "TROPONINI" in the last 168 hours.  BNP (last 3 results) No results for input(s): "BNP" in the last 8760 hours.  ProBNP (last 3 results) Recent Labs    11/16/21 1619  PROBNP 510*    CBG: Recent Labs  Lab 08/28/22 1734  GLUCAP 326*    Radiological Exams on Admission: CT Head Wo Contrast  Result Date: 08/28/2022 CLINICAL DATA:  Weakness.  Fall this morning. EXAM: CT HEAD WITHOUT CONTRAST CT CERVICAL SPINE WITHOUT CONTRAST TECHNIQUE: Multidetector CT imaging of the head and cervical spine was performed following the standard protocol without intravenous contrast. Multiplanar CT image reconstructions of the cervical spine were also generated. RADIATION DOSE REDUCTION: This exam was performed according to the departmental dose-optimization program which includes automated exposure control, adjustment of the mA and/or kV according to patient size and/or use of iterative reconstruction technique. COMPARISON:  MRI of the brain October 14, 2018. CT of the brain August 09, 2017. FINDINGS: CT HEAD FINDINGS Brain: Chronic infarcts in the left frontal parietal region left basal ganglia, and right thalamus. White matter changes remain. Cerebellum, brainstem, and basal cisterns are otherwise normal. Ventricles and sulci are prominent but stable. No subdural, epidural, or subarachnoid hemorrhage. No mass effect or midline shift. Vascular: Calcified atherosclerotic changes are identified in the intracranial carotids. Skull: Normal. Negative for fracture or focal lesion. Sinuses/Orbits: Mucosal thickening  is identified in the maxillary sinuses. Other: No other abnormalities. CT CERVICAL SPINE FINDINGS Alignment: Normal. Skull base and vertebrae: No acute fracture. No primary bone lesion or focal pathologic process. Soft tissues and spinal canal: No prevertebral fluid or swelling. No visible canal hematoma. Disc levels: Multilevel degenerative disc disease and facet degenerative changes. Upper chest: Negative. Other: No other abnormalities. IMPRESSION: 1. No acute intracranial abnormalities. Chronic infarcts as above. 2. No fracture or traumatic malalignment in the cervical spine. Multilevel degenerative changes. Electronically Signed   By: Dorise Bullion III M.D.   On: 08/28/2022 10:19   CT Cervical Spine Wo Contrast  Result Date: 08/28/2022 CLINICAL DATA:  Weakness.  Fall this morning. EXAM: CT HEAD WITHOUT CONTRAST CT CERVICAL SPINE WITHOUT CONTRAST TECHNIQUE: Multidetector CT imaging of the head and cervical spine was performed following the standard protocol without intravenous contrast. Multiplanar CT image reconstructions of the cervical spine were also generated. RADIATION DOSE REDUCTION: This exam was performed according to the departmental dose-optimization program which includes automated exposure control, adjustment of the mA and/or kV according to patient size and/or use of iterative reconstruction technique. COMPARISON:  MRI of the brain October 14, 2018. CT of the brain August 09, 2017. FINDINGS: CT HEAD FINDINGS Brain: Chronic infarcts in the left frontal parietal region left basal ganglia, and right thalamus. White matter changes remain. Cerebellum, brainstem, and basal cisterns are otherwise normal. Ventricles and sulci are prominent but stable. No subdural, epidural, or subarachnoid hemorrhage. No mass effect or midline shift. Vascular: Calcified atherosclerotic changes are identified in the intracranial carotids. Skull: Normal. Negative for fracture or focal lesion. Sinuses/Orbits: Mucosal  thickening is identified in the maxillary sinuses. Other: No other abnormalities. CT CERVICAL SPINE FINDINGS Alignment: Normal. Skull base and vertebrae: No acute fracture. No primary bone lesion or focal pathologic process. Soft tissues and spinal canal: No prevertebral fluid or swelling. No visible canal hematoma. Disc  levels: Multilevel degenerative disc disease and facet degenerative changes. Upper chest: Negative. Other: No other abnormalities. IMPRESSION: 1. No acute intracranial abnormalities. Chronic infarcts as above. 2. No fracture or traumatic malalignment in the cervical spine. Multilevel degenerative changes. Electronically Signed   By: Dorise Bullion III M.D.   On: 08/28/2022 10:19   DG Pelvis 1-2 Views  Result Date: 08/28/2022 CLINICAL DATA:  81 year old male with weakness and recurrent falls. Pain. EXAM: PELVIS - 1-2 VIEW COMPARISON:  CT Abdomen and Pelvis 02/17/2022. FINDINGS: Chronic moderate to severe asymmetric left hip joint space loss, osteoarthritis. Femoral heads remain normally located. Pelvis appears intact. Grossly intact proximal femurs. Retained stool otherwise negative lower abdominal and pelvic visceral contours. IMPRESSION: No acute fracture or dislocation identified about the pelvis. Left hip osteoarthritis. Electronically Signed   By: Genevie Ann M.D.   On: 08/28/2022 10:02   DG Chest Port 1 View  Result Date: 08/28/2022 CLINICAL DATA:  81 year old male with weakness and recurrent falls. Pain. EXAM: PORTABLE CHEST 1 VIEW COMPARISON:  Chest radiographs 02/16/2022 and earlier. FINDINGS: Portable AP view at 0948 hours. Lordotic view. This exaggerates the cardiac size. Mediastinal contours remain normal. Visualized tracheal air column is within normal limits. Allowing for portable technique the lungs are clear. No pneumothorax. Chronic right rib fractures. Stable visualized osseous structures. IMPRESSION: No acute cardiopulmonary abnormality or acute traumatic injury identified.  Electronically Signed   By: Genevie Ann M.D.   On: 08/28/2022 10:00    EKG: I independently viewed the EKG done and my findings are as followed: Atrial fibrillation rate of 87.  Nonspecific ST-T changes.  QTc 425.  Assessment/Plan Present on Admission:  COVID-19 virus infection  Principal Problem:   COVID-19 virus infection  COVID-19 viral infection, POA. COVID-19 screening test positive on 08/28/2022. Airborne/contact precautions 7 to 10 days His chest x-ray nonacute. Continue molnupiravir and IV Solu-Medrol Continue supportive care, bronchodilators Vitamin C, zinc Incentive spirometer Monitor inflammatory markers  Acute hypoxic respiratory failure secondary to COVID-19 viral infection. 1 documented reading of 83% on room air Currently on 2 L with O2 saturation of 98% Started on IV Solu-Medrol in the ED, continue, IV Solu-Medrol 40 mg daily x 3 days Wean off oxygen supplementation as tolerated.  Type 2 diabetes with hyperglycemia Obtain hemoglobin A1c Start insulin sliding scale, NovoLog Mix 94/76  Chronic diastolic CHF Resume home cardiac medications Home torsemide restarted Start strict I's and O's and daily weight  Hyperlipidemia Resume home Lipitor  Permanent atrial fibrillation, rate controlled Resume home regimen Eliquis and Lopressor  Hypothyroidism Resume home levothyroxine  BPH Resume home Flomax Monitor urine output  Chronic constipation No bowel movement in 5 days Start bowel regimen  Chronic anxiety Restarted on home Xanax 0.5 mg twice daily as needed  Generalized weakness in the setting of COVID-19 viral infection PT OT assessment Fall precautions.  DVT prophylaxis: Home Eliquis  Code Status: Full code  Family Communication: None at bedside.  Disposition Plan: Admitted to telemetry unit  Consults called: None.  Admission status: Inpatient status.   Status is: Inpatient The patient requires at least 2 midnights for further evaluation  and treatment of present condition.   Kayleen Memos MD [facility] Pager [phone]  If 7PM-7AM, please contact night-coverage [email or web address] Password [email or web address]  08/28/2022, 8:40 PM

## 2022-08-28 NOTE — Progress Notes (Signed)
Plan of Care Note for accepted transfer   Patient: [name] MRN: [id number]   DOA: 08/28/2022  Facility requesting transfer: [facility] Requesting Provider: Long Reason for transfer: weakness from [place] course: Patient with h/o afib on Eliquis, DM, HTN, HLD, OSA, and obesity presenting with weakness and falls this AM, COVID positive.  Started this AM with weakness, recent h/o falls.  Collapsed to the floor, no LOC.  Has COVID, troponin leak - ?myocarditis.  No medication treatments for COVID available at [facility].  Patient will need consideration of COVID treatment as well as PT/OT.  EDP will give steroids for now.  Plan of care: The patient is accepted for observation to Telemetry unit, at either [facility] or [place].   Author: Karmen Bongo, MD 08/28/2022  Check [email or web address] for on-call coverage.  Nursing staff, Please call [place] number on Amion as soon as patient's arrival, so appropriate admitting provider can evaluate the pt.

## 2022-08-28 NOTE — Hospital Course (Addendum)
81 y.o. male with hx of stroke (no residual deficits), paroyxysmal afib on eliquis, diastolic congestive heart failure, prior stroke without deficit, obstructive sleep apnea not on CPAP, hypertension, diabetes mellitus type 2, peripheral artery disease, hyperlipidemia and medication noncompliance presenting to [facility] emergency department via EMS status post fall after the patient has been experiencing weakness as of late.  Upon evaluation in the emergency department patient, while not hypoxic is found to be extremely weak and positive for COVID.  EDP requesting hospitalization.  500 cc normal saline bolus and 125 mg of Solu-Medrol administered in the ED. During workup in the emergency department patient found to have slightly elevated troponin with flat trajectory of elevation on serial troponins, no chest pain and no dynamic ST segment changes noted on EKG.[name]

## 2022-08-28 NOTE — ED Provider Notes (Signed)
[facility] Provider Note   CSN: [id number] Arrival date & time: 08/28/22  1540     History  Chief Complaint  Patient presents with   Weakness   HPI [name] is a 81 y.o. male with A-fib, diabetes, hypertension, history of stroke, lower extremity neuropathy, basal cell and squamous cell carcinoma presenting for weakness.  Started 2 AM this morning.  States he was walking from his recliner to the bathroom.  On his way back from the bathroom he felt extremely weak and stated he could not "take another step" and fell to the ground.  Landed on his right side.  Denies head injury or loss of consciousness.  Denies dizziness, lightheadedness and syncope or near syncope.  On Eliquis for A-fib.  Also endorses some right flank pain after EMS "hoisted him into the truck".  Denies hip pain.  Chest pain shortness of breath.  Son reports that patient has fallen "a couple of times" in the last 6 months which he attributes to his lower extremity bilateral neuropathy.   Weakness      Home Medications Prior to Admission medications   Medication Sig Start Date End Date Taking? Authorizing Provider  ALPRAZolam Duanne Moron) 0.5 MG tablet Take 0.5 mg by mouth 2 (two) times daily.   Yes [provider]  apixaban (ELIQUIS) 5 MG TABS tablet Take 1 tablet by mouth twice daily Patient taking differently: Take 5 mg by mouth 2 (two) times daily. 02/21/22  Yes Belva Crome, MD  atorvastatin (LIPITOR) 80 MG tablet Take 1 tablet (80 mg total) by mouth daily at 6 PM. Patient taking differently: Take 80 mg by mouth at bedtime. 08/11/17  Yes Mariel Aloe, MD  ergocalciferol (VITAMIN D2) 50000 units capsule Take 50,000 Units by mouth every Sunday.   Yes [provider]  gabapentin (NEURONTIN) 600 MG tablet Take 600 mg by mouth every evening. 05/06/22  Yes [provider]  hydroxyurea (HYDREA) 500 MG capsule Take 500 mg by mouth in the morning and at  bedtime. May take with food to minimize GI side effects.   Yes [provider]  levothyroxine (SYNTHROID) 75 MCG tablet Take 75 mcg by mouth daily before breakfast. 03/31/20  Yes [provider]  metFORMIN (GLUCOPHAGE) 1000 MG tablet Take 500 mg by mouth in the morning and at bedtime. 08/27/18  Yes [provider]  metolazone (ZAROXOLYN) 2.5 MG tablet Take 1 tablet (2.5 mg total) by mouth 2 (two) times a week. Take on Tuesdays and Fridays 30 minutes before taking Torsemide. Patient taking differently: Take 2.5 mg by mouth See admin instructions. Take 2.5 mg by mouth on Tuesdays and Fridays- 30 minutes before taking Torsemide 03/21/22  Yes Belva Crome, MD  metoprolol tartrate (LOPRESSOR) 50 MG tablet Take 50 mg by mouth 2 (two) times daily. 05/13/19  Yes [provider]  Multiple Vitamin (MULTIVITAMIN WITH MINERALS) TABS tablet Take 1 tablet by mouth daily with breakfast.   Yes [provider]  NOVOLIN 70/30 RELION (70-30) 100 UNIT/ML injection Inject 44 Units into the skin in the morning and at bedtime.   Yes [provider]  omeprazole (PRILOSEC OTC) 20 MG tablet Take 20 mg by mouth daily before breakfast.   Yes [provider]  polyethylene glycol powder (MIRALAX) 17 GM/SCOOP powder Please take 6 capfuls of MiraLAX in a 16 oz bottle of Gatorade over 2-4 hour period. The following day take 3 capfuls. On day 3 start taking 1  capful 3 times a day. Slowly cut back as needed until you have normal bowel movements. Patient taking differently: Take 17 g by mouth daily as needed for mild constipation (mix and drink as directed). 02/17/22  Yes Cardama, Grayce Sessions, MD  potassium chloride (KLOR-CON) 10 MEQ tablet Take 10 mEq by mouth daily.   Yes [provider]  tamsulosin (FLOMAX) 0.4 MG CAPS capsule Take 0.4 mg by mouth daily. 12/06/20  Yes [provider]  topiramate (TOPAMAX) 50 MG tablet TAKE 1 TABLET BY MOUTH AT BEDTIME ,  MAY   REPEAT  DOSE  ONE TIME  IF  NEEDED Patient taking differently: Take 50 mg by mouth at bedtime. 06/06/22  Yes McCue, Janett Billow, NP  torsemide (DEMADEX) 20 MG tablet Take 3 tablets by mouth once daily Patient taking differently: Take 60 mg by mouth in the morning. 06/20/22  Yes Belva Crome, MD  venlafaxine XR (EFFEXOR-XR) 150 MG 24 hr capsule Take 150 mg by mouth daily with breakfast.   Yes [provider]  glucose blood (ONETOUCH ULTRA) test strip USE 1 STRIP TO CHECK GLUCOSE 4 TIMES DAILY 02/18/20   [provider]  insulin NPH-regular Human (NOVOLIN 70/30) (70-30) 100 UNIT/ML injection Inject 44 Units into the skin 2 (two) times daily with a meal. Patient not taking: Reported on 08/28/2022    [provider]  omeprazole (PRILOSEC) 20 MG capsule Take 20 mg by mouth daily. Patient not taking: Reported on 08/28/2022    [provider]  [facility] VERIO test strip SMARTSIG:Via Meter 07/02/20   [provider]  venlafaxine XR (EFFEXOR-XR) 75 MG 24 hr capsule Take 75 mg by mouth daily. Patient not taking: Reported on 08/28/2022    [provider]      Allergies    Penicillin g sodium    Review of Systems   Review of Systems  Neurological:  Positive for weakness.    Physical Exam Updated Vital Signs BP (!) 141/77 (BP Location: Left Arm)   Pulse 78   Temp 98.8 F (37.1 C) (Oral)   Resp 16   Ht '5\' 11"'$  (1.803 m)   Wt 118.8 kg   SpO2 97%   BMI 36.53 kg/m  Physical Exam Vitals reviewed.  HENT:     Head: Normocephalic and atraumatic.     Comments: No Battle sign, raccoon eyes or hemotympanum.    Mouth/Throat:     Mouth: Mucous membranes are moist.  Eyes:     General:        Right eye: No discharge.        Left eye: No discharge.     Conjunctiva/sclera: Conjunctivae normal.  Cardiovascular:     Rate and Rhythm: Normal rate. Rhythm irregular.     Pulses: Normal pulses.     Heart sounds: Normal heart sounds.  Pulmonary:      Effort: Pulmonary effort is normal. Tachypnea present.     Breath sounds: Normal breath sounds. No decreased breath sounds, wheezing, rhonchi or rales.  Abdominal:     General: Abdomen is flat.     Palpations: Abdomen is soft.     Tenderness: There is no right CVA tenderness or left CVA tenderness.  Musculoskeletal:     Right hip: Normal. No deformity, tenderness or crepitus. Normal range of motion.     Left hip: Normal.  Skin:    General: Skin is warm and dry.     Capillary Refill: Capillary refill takes less than 2 seconds.  Coloration: Skin is pale.  Neurological:     General: No focal deficit present.     Comments: GCS 15. Speech is goal oriented. No deficits appreciated to CN III-XII; symmetric eyebrow raise, no facial drooping, tongue midline. Patient has equal grip strength bilaterally with 5/5 strength against resistance in all major muscle groups bilaterally. Sensation to light touch intact. Patient moves extremities without ataxia. Normal finger-nose-finger.  Psychiatric:        Mood and Affect: Mood normal.     ED Results / Procedures / Treatments   Labs (all labs ordered are listed, but only abnormal results are displayed) Labs Reviewed  RESP PANEL BY RT-PCR (RSV, FLU A&B, COVID)  RVPGX2 - Abnormal; Notable for the following components:      Result Value   SARS Coronavirus 2 by RT PCR POSITIVE (*)    All other components within normal limits  CBC WITH DIFFERENTIAL/PLATELET - Abnormal; Notable for the following components:   RBC 2.37 (*)    Hemoglobin 10.3 (*)    HCT 29.4 (*)    MCV 124.1 (*)    MCH 43.5 (*)    Platelets 543 (*)    nRBC 0.5 (*)    Lymphs Abs 0.4 (*)    All other components within normal limits  COMPREHENSIVE METABOLIC PANEL - Abnormal; Notable for the following components:   Sodium 133 (*)    Chloride 96 (*)    Glucose, Bld 133 (*)    BUN 31 (*)    Creatinine, Ser 1.53 (*)    AST 175 (*)    ALT 49 (*)    GFR, Estimated 46 (*)    All other  components within normal limits  URINALYSIS, ROUTINE W REFLEX MICROSCOPIC - Abnormal; Notable for the following components:   pH 8.5 (*)    Hgb urine dipstick LARGE (*)    Protein, ur 30 (*)    All other components within normal limits  URINALYSIS, MICROSCOPIC (REFLEX) - Abnormal; Notable for the following components:   Bacteria, UA RARE (*)    All other components within normal limits  GLUCOSE, CAPILLARY - Abnormal; Notable for the following components:   Glucose-Capillary 326 (*)    All other components within normal limits  TROPONIN I (HIGH SENSITIVITY) - Abnormal; Notable for the following components:   Troponin I (High Sensitivity) 30 (*)    All other components within normal limits  TROPONIN I (HIGH SENSITIVITY) - Abnormal; Notable for the following components:   Troponin I (High Sensitivity) 32 (*)    All other components within normal limits  COMPREHENSIVE METABOLIC PANEL  MAGNESIUM  CBC WITH DIFFERENTIAL/PLATELET  D-DIMER, QUANTITATIVE  C-REACTIVE PROTEIN  D-DIMER, QUANTITATIVE  PROCALCITONIN  C-REACTIVE PROTEIN  HEMOGLOBIN A1C  CBG MONITORING, ED    EKG EKG Interpretation  Date/Time:  Sunday August 28 2022 09:40:03 EST Ventricular Rate:  85 PR Interval:    QRS Duration: 96 QT Interval:  359 QTC Calculation: 427 R Axis:   13 Text Interpretation: Atrial fibrillation Confirmed by Nanda Quinton [phone]) on 08/28/2022 9:49:08 AM  Radiology CT Head Wo Contrast  Result Date: 08/28/2022 CLINICAL DATA:  Weakness.  Fall this morning. EXAM: CT HEAD WITHOUT CONTRAST CT CERVICAL SPINE WITHOUT CONTRAST TECHNIQUE: Multidetector CT imaging of the head and cervical spine was performed following the standard protocol without intravenous contrast. Multiplanar CT image reconstructions of the cervical spine were also generated. RADIATION DOSE REDUCTION: This exam was performed according to the departmental dose-optimization program which includes automated exposure  control,  adjustment of the mA and/or kV according to patient size and/or use of iterative reconstruction technique. COMPARISON:  MRI of the brain October 14, 2018. CT of the brain August 09, 2017. FINDINGS: CT HEAD FINDINGS Brain: Chronic infarcts in the left frontal parietal region left basal ganglia, and right thalamus. White matter changes remain. Cerebellum, brainstem, and basal cisterns are otherwise normal. Ventricles and sulci are prominent but stable. No subdural, epidural, or subarachnoid hemorrhage. No mass effect or midline shift. Vascular: Calcified atherosclerotic changes are identified in the intracranial carotids. Skull: Normal. Negative for fracture or focal lesion. Sinuses/Orbits: Mucosal thickening is identified in the maxillary sinuses. Other: No other abnormalities. CT CERVICAL SPINE FINDINGS Alignment: Normal. Skull base and vertebrae: No acute fracture. No primary bone lesion or focal pathologic process. Soft tissues and spinal canal: No prevertebral fluid or swelling. No visible canal hematoma. Disc levels: Multilevel degenerative disc disease and facet degenerative changes. Upper chest: Negative. Other: No other abnormalities. IMPRESSION: 1. No acute intracranial abnormalities. Chronic infarcts as above. 2. No fracture or traumatic malalignment in the cervical spine. Multilevel degenerative changes. Electronically Signed   By: Dorise Bullion III M.D.   On: 08/28/2022 10:19   CT Cervical Spine Wo Contrast  Result Date: 08/28/2022 CLINICAL DATA:  Weakness.  Fall this morning. EXAM: CT HEAD WITHOUT CONTRAST CT CERVICAL SPINE WITHOUT CONTRAST TECHNIQUE: Multidetector CT imaging of the head and cervical spine was performed following the standard protocol without intravenous contrast. Multiplanar CT image reconstructions of the cervical spine were also generated. RADIATION DOSE REDUCTION: This exam was performed according to the departmental dose-optimization program which includes automated  exposure control, adjustment of the mA and/or kV according to patient size and/or use of iterative reconstruction technique. COMPARISON:  MRI of the brain October 14, 2018. CT of the brain August 09, 2017. FINDINGS: CT HEAD FINDINGS Brain: Chronic infarcts in the left frontal parietal region left basal ganglia, and right thalamus. White matter changes remain. Cerebellum, brainstem, and basal cisterns are otherwise normal. Ventricles and sulci are prominent but stable. No subdural, epidural, or subarachnoid hemorrhage. No mass effect or midline shift. Vascular: Calcified atherosclerotic changes are identified in the intracranial carotids. Skull: Normal. Negative for fracture or focal lesion. Sinuses/Orbits: Mucosal thickening is identified in the maxillary sinuses. Other: No other abnormalities. CT CERVICAL SPINE FINDINGS Alignment: Normal. Skull base and vertebrae: No acute fracture. No primary bone lesion or focal pathologic process. Soft tissues and spinal canal: No prevertebral fluid or swelling. No visible canal hematoma. Disc levels: Multilevel degenerative disc disease and facet degenerative changes. Upper chest: Negative. Other: No other abnormalities. IMPRESSION: 1. No acute intracranial abnormalities. Chronic infarcts as above. 2. No fracture or traumatic malalignment in the cervical spine. Multilevel degenerative changes. Electronically Signed   By: Dorise Bullion III M.D.   On: 08/28/2022 10:19   DG Pelvis 1-2 Views  Result Date: 08/28/2022 CLINICAL DATA:  81 year old male with weakness and recurrent falls. Pain. EXAM: PELVIS - 1-2 VIEW COMPARISON:  CT Abdomen and Pelvis 02/17/2022. FINDINGS: Chronic moderate to severe asymmetric left hip joint space loss, osteoarthritis. Femoral heads remain normally located. Pelvis appears intact. Grossly intact proximal femurs. Retained stool otherwise negative lower abdominal and pelvic visceral contours. IMPRESSION: No acute fracture or dislocation identified  about the pelvis. Left hip osteoarthritis. Electronically Signed   By: Genevie Ann M.D.   On: 08/28/2022 10:02   DG Chest Port 1 View  Result Date: 08/28/2022 CLINICAL DATA:  81 year old male with weakness and  recurrent falls. Pain. EXAM: PORTABLE CHEST 1 VIEW COMPARISON:  Chest radiographs 02/16/2022 and earlier. FINDINGS: Portable AP view at 0948 hours. Lordotic view. This exaggerates the cardiac size. Mediastinal contours remain normal. Visualized tracheal air column is within normal limits. Allowing for portable technique the lungs are clear. No pneumothorax. Chronic right rib fractures. Stable visualized osseous structures. IMPRESSION: No acute cardiopulmonary abnormality or acute traumatic injury identified. Electronically Signed   By: Genevie Ann M.D.   On: 08/28/2022 10:00    Procedures Procedures    Medications Ordered in ED Medications  acetaminophen (TYLENOL) tablet 650 mg (has no administration in time range)    Or  acetaminophen (TYLENOL) suppository 650 mg (has no administration in time range)  polyethylene glycol (MIRALAX / GLYCOLAX) packet 17 g (has no administration in time range)  ondansetron (ZOFRAN) tablet 4 mg (has no administration in time range)    Or  ondansetron (ZOFRAN) injection 4 mg (has no administration in time range)  albuterol (VENTOLIN HFA) 108 (90 Base) MCG/ACT inhaler 2 puff (has no administration in time range)  guaiFENesin-dextromethorphan (ROBITUSSIN DM) 100-10 MG/5ML syrup 10 mL (has no administration in time range)  ascorbic acid (VITAMIN C) tablet 500 mg (has no administration in time range)  zinc sulfate capsule 220 mg (has no administration in time range)  apixaban (ELIQUIS) tablet 5 mg (has no administration in time range)  atorvastatin (LIPITOR) tablet 80 mg (has no administration in time range)  ALPRAZolam (XANAX) tablet 0.5 mg (has no administration in time range)  insulin aspart protamine- aspart (NOVOLOG MIX 70/30) injection 44 Units (has no  administration in time range)  levothyroxine (SYNTHROID) tablet 75 mcg (has no administration in time range)  metoprolol tartrate (LOPRESSOR) tablet 50 mg (has no administration in time range)  venlafaxine XR (EFFEXOR-XR) 24 hr capsule 75 mg (has no administration in time range)  torsemide (DEMADEX) tablet 60 mg (has no administration in time range)  topiramate (TOPAMAX) tablet 50 mg (has no administration in time range)  tamsulosin (FLOMAX) capsule 0.4 mg (has no administration in time range)  pantoprazole (PROTONIX) EC tablet 40 mg (has no administration in time range)  multivitamin with minerals tablet 1 tablet (has no administration in time range)  insulin aspart (novoLOG) injection 0-10 Units (has no administration in time range)  molnupiravir EUA (LAGEVRIO) capsule 800 mg (has no administration in time range)  sodium chloride 0.9 % bolus 500 mL (0 mLs Intravenous Stopped 08/28/22 1123)  methylPREDNISolone sodium succinate (SOLU-MEDROL) 125 mg/2 mL injection 125 mg (125 mg Intravenous Given 08/28/22 1259)    ED Course/ Medical Decision Making/ A&P                           Medical Decision Making Amount and/or Complexity of Data Reviewed Labs: ordered. Radiology: ordered. ECG/medicine tests: ordered.  Risk Prescription drug management. Decision regarding hospitalization.   Initial Impression and Ddx 81 yo male who well appearing, non toxic and hemodynamically stable presenting for weakness that began this morning. PE exam notable for irregular rhythm but no FND. Differential diagnosis includes ICH, stroke, skull fracture, arrhythmia genic syncope, ACS, electrolyte derangement, and COVID. Patient PMH that increases complexity of ED encounter: A-fib on Eliquis, prior stroke, DM, peripheral vascular disease and lower extremity neuropathy  Interpretation of Diagnostics I independent reviewed and interpreted the labs as followed: elevated troponin, hematuria, elevated  BUN/creatinine  - I independently visualized the following imaging with scope of interpretation limited to determining  acute life threatening conditions related to emergency care: CT head which revealed no acute intracranial pathology, CT cervical spine without fracture or malalignment, pelvic x-ray without acute process but did show left hip osteoarthritis, chest x-ray negative.  Patient Reassessment and Ultimate Disposition/Management Considered ICH and stroke unlikely given results of CT scan and no FND.  Also considered ACS but unlikely given reassuring EKG and no chest pain or shortness of breath.  Considered electrolyte derangement but CMP reassuring.  Weakness could be related to new onset COVID infection in setting of his existing comorbidities and possible dehydration.  Volume resuscitated with half liter of normal saline.  Upon reevaluation.  Patient was still unable to ambulate and was reluctant to get out of bed stating he was too weak to do so.  Persistence of his weakness prompted admission to the hospital.  Hospital attending advised to start Solu-Medrol and agreed to admit.  Patient management required discussion with the following services or consulting groups:  Hospitalist Service  Complexity of Problems Addressed Acute complicated illness or Injury  Additional Data Reviewed and Analyzed Further history obtained from: Further history from spouse/family member, Recent PCP notes, and Recent Consult notes  Patient Encounter Risk Assessment Consideration of hospitalization         Final Clinical Impression(s) / ED Diagnoses Final diagnoses:  COVID  Weakness    Rx / DC Orders ED Discharge Orders     None         Harriet Pho, PA-C 08/28/22 Duanne Limerick, MD 09/04/22 [phone]

## 2022-08-29 DIAGNOSIS — R531 Weakness: Secondary | ICD-10-CM | POA: Diagnosis not present

## 2022-08-29 DIAGNOSIS — U071 COVID-19: Secondary | ICD-10-CM | POA: Diagnosis not present

## 2022-08-29 LAB — CBC WITH DIFFERENTIAL/PLATELET
Abs Immature Granulocytes: 0.03 10*3/uL (ref 0.00–0.07)
Basophils Absolute: 0 10*3/uL (ref 0.0–0.1)
Basophils Relative: 0 %
Eosinophils Absolute: 0 10*3/uL (ref 0.0–0.5)
Eosinophils Relative: 0 %
HCT: 27.3 % — ABNORMAL LOW (ref 39.0–52.0)
Hemoglobin: 9.4 g/dL — ABNORMAL LOW (ref 13.0–17.0)
Immature Granulocytes: 1 %
Lymphocytes Relative: 7 %
Lymphs Abs: 0.3 10*3/uL — ABNORMAL LOW (ref 0.7–4.0)
MCH: 43.5 pg — ABNORMAL HIGH (ref 26.0–34.0)
MCHC: 34.4 g/dL (ref 30.0–36.0)
MCV: 126.4 fL — ABNORMAL HIGH (ref 80.0–100.0)
Monocytes Absolute: 0.7 10*3/uL (ref 0.1–1.0)
Monocytes Relative: 14 %
Neutro Abs: 3.9 10*3/uL (ref 1.7–7.7)
Neutrophils Relative %: 78 %
Platelets: 489 10*3/uL — ABNORMAL HIGH (ref 150–400)
RBC: 2.16 MIL/uL — ABNORMAL LOW (ref 4.22–5.81)
RDW: 14.6 % (ref 11.5–15.5)
WBC: 4.9 10*3/uL (ref 4.0–10.5)
nRBC: 1.2 % — ABNORMAL HIGH (ref 0.0–0.2)

## 2022-08-29 LAB — BASIC METABOLIC PANEL
Anion gap: 8 (ref 5–15)
BUN: 36 mg/dL — ABNORMAL HIGH (ref 8–23)
CO2: 28 mmol/L (ref 22–32)
Calcium: 8.9 mg/dL (ref 8.9–10.3)
Chloride: 98 mmol/L (ref 98–111)
Creatinine, Ser: 1.28 mg/dL — ABNORMAL HIGH (ref 0.61–1.24)
GFR, Estimated: 57 mL/min — ABNORMAL LOW (ref >60)
Glucose, Bld: 285 mg/dL — ABNORMAL HIGH (ref 70–99)
Potassium: 3.4 mmol/L — ABNORMAL LOW (ref 3.5–5.1)
Sodium: 134 mmol/L — ABNORMAL LOW (ref 135–145)

## 2022-08-29 LAB — GLUCOSE, CAPILLARY
Glucose-Capillary: 276 mg/dL — ABNORMAL HIGH (ref 70–99)
Glucose-Capillary: 293 mg/dL — ABNORMAL HIGH (ref 70–99)
Glucose-Capillary: 293 mg/dL — ABNORMAL HIGH (ref 70–99)
Glucose-Capillary: 407 mg/dL — ABNORMAL HIGH (ref 70–99)
Glucose-Capillary: 470 mg/dL — ABNORMAL HIGH (ref 70–99)
Glucose-Capillary: 508 mg/dL (ref 70–99)

## 2022-08-29 LAB — COMPREHENSIVE METABOLIC PANEL
ALT: 61 U/L — ABNORMAL HIGH (ref 0–44)
AST: 176 U/L — ABNORMAL HIGH (ref 15–41)
Albumin: 3.1 g/dL — ABNORMAL LOW (ref 3.5–5.0)
Alkaline Phosphatase: 91 U/L (ref 38–126)
Anion gap: 8 (ref 5–15)
BUN: 37 mg/dL — ABNORMAL HIGH (ref 8–23)
CO2: 25 mmol/L (ref 22–32)
Calcium: 8.8 mg/dL — ABNORMAL LOW (ref 8.9–10.3)
Chloride: 101 mmol/L (ref 98–111)
Creatinine, Ser: 1.43 mg/dL — ABNORMAL HIGH (ref 0.61–1.24)
GFR, Estimated: 50 mL/min — ABNORMAL LOW (ref >60)
Glucose, Bld: 431 mg/dL — ABNORMAL HIGH (ref 70–99)
Potassium: 3.6 mmol/L (ref 3.5–5.1)
Sodium: 134 mmol/L — ABNORMAL LOW (ref 135–145)
Total Bilirubin: 0.6 mg/dL (ref 0.3–1.2)
Total Protein: 6.2 g/dL — ABNORMAL LOW (ref 6.5–8.1)

## 2022-08-29 LAB — C-REACTIVE PROTEIN: CRP: 2.7 mg/dL — ABNORMAL HIGH (ref <1.0)

## 2022-08-29 LAB — D-DIMER, QUANTITATIVE: D-Dimer, Quant: 0.39 ug/mL-FEU (ref 0.00–0.50)

## 2022-08-29 LAB — MAGNESIUM: Magnesium: 2.3 mg/dL (ref 1.7–2.4)

## 2022-08-29 MED ORDER — BISACODYL 5 MG PO TBEC
10.0000 mg | DELAYED_RELEASE_TABLET | Freq: Once | ORAL | Status: AC
  Administered 2022-08-29: 10 mg via ORAL
  Filled 2022-08-29: qty 2

## 2022-08-29 MED ORDER — INSULIN ASPART 100 UNIT/ML IJ SOLN
4.0000 [IU] | Freq: Three times a day (TID) | INTRAMUSCULAR | Status: DC
  Administered 2022-08-29 – 2022-08-31 (×6): 4 [IU] via SUBCUTANEOUS

## 2022-08-29 MED ORDER — INSULIN ASPART 100 UNIT/ML IJ SOLN
3.0000 [IU] | Freq: Once | INTRAMUSCULAR | Status: AC
  Administered 2022-08-29: 3 [IU] via SUBCUTANEOUS

## 2022-08-29 MED ORDER — IPRATROPIUM-ALBUTEROL 20-100 MCG/ACT IN AERS: 1.0000 | INHALATION_SPRAY | Freq: Four times a day (QID) | RESPIRATORY_TRACT | Status: DC | PRN

## 2022-08-29 MED ORDER — HYDRALAZINE HCL 25 MG PO TABS: 25.0000 mg | ORAL_TABLET | Freq: Three times a day (TID) | ORAL | Status: DC | PRN

## 2022-08-29 MED ORDER — BISACODYL 10 MG RE SUPP: 10.0000 mg | Freq: Every day | RECTAL | Status: DC | PRN

## 2022-08-29 MED ORDER — POLYETHYLENE GLYCOL 3350 17 G PO PACK
17.0000 g | PACK | Freq: Two times a day (BID) | ORAL | Status: DC
  Administered 2022-08-29 – 2022-08-31 (×5): 17 g via ORAL
  Filled 2022-08-29 (×5): qty 1

## 2022-08-29 NOTE — Progress Notes (Signed)
[facility]                                                                               [name], is a 81 y.o. male, DOB - [birth year], YIR:[id number] Admit date - 08/28/2022    Outpatient Primary MD for the patient is [name], MD  LOS - 1  days    Brief summary   81 y.o. male with hx of stroke (no residual deficits), paroyxysmal afib on eliquis, diastolic congestive heart failure, prior stroke without deficit, obstructive sleep apnea not on CPAP, hypertension, diabetes mellitus type 2, peripheral artery disease, hyperlipidemia and medication noncompliance presenting to [facility] emergency department via EMS status post fall after the patient has been experiencing weakness as of late.  Upon evaluation in the emergency department patient, while not hypoxic is found to be extremely weak and positive for COVID.  EDP requesting hospitalization.  500 cc normal saline bolus and 125 mg of Solu-Medrol administered in the ED. During workup in the emergency department patient found to have slightly elevated troponin with flat trajectory of elevation on serial troponins, no chest pain and no dynamic ST segment changes noted on EKG..   Assessment & Plan    Assessment and Plan:   Acute respiratory failure with hypoxia sec to COVID 19 infection.  - improving.continue with IV solumedrol and molnupiravir ,  - follow inflammatory markers.  - cxr is unremarkable.  - therapy evaluation.      Constipation;  Senna, colace, dulcolax and miralax ordered.     Type 2 DM UNCONTROLLED with hyperglycemia sec to steroids.  CBG (last 3)  Recent Labs    08/29/22 0731 08/29/22 1153 08/29/22 1658  GLUCAP 407* 293* 276*   Resume SSI and add novolog TIDAC.    Permanent atrial fib  Rate controlled.  Continue with lopressor and eliquis.    Hypothyroidism  Resume levothyroxine.    BPH  Resume flomax.    Chronic diastolic CHF Resume home meds.   Continue with strict intake and output.     Diabetic neuropathy:  Resume gabapentin.   Hypokalemia:  Replaced.    Hyponatremia:  Mild , continue to monitor.     Macrocytic anemia:  Check anemia panel.     Estimated body mass index is 36.53 kg/m as calculated from the following:   Height as of this encounter: '5\' 11"'$  (1.803 m).   Weight as of this encounter: 118.8 kg.  Code Status: full code.  DVT Prophylaxis:   apixaban (ELIQUIS) tablet 5 mg   Level of Care: Level of care: Telemetry Family Communication: none at bedside.   Disposition Plan:     Remains inpatient appropriate:  IV steroids.   Procedures:  None.   Consultants:   None.   Antimicrobials:   Anti-infectives (From admission, onward)    Start     Dose/Rate Route Frequency Ordered Stop   08/28/22 2200  nirmatrelvir/ritonavir EUA (renal dosing) (PAXLOVID) 2 tablet  Status:  Discontinued        2 tablet Oral 2 times daily 08/28/22 1727 08/28/22 1737   08/28/22 2200  molnupiravir EUA (LAGEVRIO) capsule 800 mg  4 capsule Oral 2 times daily 08/28/22 1738 09/02/22 2159        Medications  Scheduled Meds:  apixaban  5 mg Oral BID   vitamin C  500 mg Oral Daily   atorvastatin  80 mg Oral q1800   insulin aspart  0-20 Units Subcutaneous TID WC   insulin aspart  0-5 Units Subcutaneous QHS   insulin aspart  4 Units Subcutaneous TID WC   insulin aspart protamine- aspart  26 Units Subcutaneous BID WC   levothyroxine  75 mcg Oral Q0600   methylPREDNISolone (SOLU-MEDROL) injection  40 mg Intravenous Daily   metoprolol tartrate  50 mg Oral BID   molnupiravir EUA  4 capsule Oral BID   multivitamin with minerals  1 tablet Oral Daily   pantoprazole  40 mg Oral Daily   polyethylene glycol  17 g Oral BID   senna-docusate  2 tablet Oral QHS   tamsulosin  0.4 mg Oral Daily   topiramate  50 mg Oral QHS   torsemide  60 mg Oral Daily   venlafaxine XR  150 mg Oral Daily   zinc sulfate  220 mg Oral Daily    Continuous Infusions: PRN Meds:.acetaminophen **OR** acetaminophen, albuterol, ALPRAZolam, bisacodyl, guaiFENesin-dextromethorphan, Ipratropium-Albuterol, ondansetron **OR** ondansetron (ZOFRAN) IV    Subjective:   [name] was seen and examined today.  Requesting for neuropathy  meds.  Constipated.  Breathing is the same.   Objective:   Vitals:   08/28/22 2222 08/29/22 0050 08/29/22 0618 08/29/22 1509  BP: (!) 138/54 (!) 144/53 (!) 192/76   Pulse: 74 (!) 53 (!) 54   Resp:  18 17   Temp:  98.7 F (37.1 C) (!) 97.5 F (36.4 C)   TempSrc:  Oral Oral   SpO2:  97% 100% 98%  Weight:      Height:        Intake/Output Summary (Last 24 hours) at 08/29/2022 1646 Last data filed at 08/29/2022 1035 Gross per 24 hour  Intake 827 ml  Output 1550 ml  Net -723 ml   Filed Weights   08/28/22 1749  Weight: 118.8 kg     Exam General exam: Elderly gentleman, on 2 lit of [place] oxygen , not in distress.  Respiratory system: Clear to auscultation. Respiratory effort normal. Cardiovascular system: S1 & S2 heard, RRR. No JVD,  No pedal edema. Gastrointestinal system: Abdomen is nondistended, soft and nontender.  Central nervous system: Alert and oriented. No focal neurological deficits. Extremities: Symmetric 5 x 5 power. Skin: No rashes,  Psychiatry:  Mood & affect appropriate.    Data Reviewed:  I have personally reviewed following labs and imaging studies   CBC Lab Results  Component Value Date   WBC 4.9 08/29/2022   RBC 2.16 (L) 08/29/2022   HGB 9.4 (L) 08/29/2022   HCT 27.3 (L) 08/29/2022   MCV 126.4 (H) 08/29/2022   MCH 43.5 (H) 08/29/2022   PLT 489 (H) 08/29/2022   MCHC 34.4 08/29/2022   RDW 14.6 08/29/2022   LYMPHSABS 0.3 (L) 08/29/2022   MONOABS 0.7 08/29/2022   EOSABS 0.0 08/29/2022   BASOSABS 0.0 81/44/8185     Last metabolic panel Lab Results  Component Value Date   NA 134 (L) 08/29/2022   K 3.4 (L) 08/29/2022   CL 98 08/29/2022   CO2 28  08/29/2022   BUN 36 (H) 08/29/2022   CREATININE 1.28 (H) 08/29/2022   GLUCOSE 285 (H) 08/29/2022   GFRNONAA 57 (L) 08/29/2022   GFRAA  89 08/12/2020   CALCIUM 8.9 08/29/2022   PROT 6.2 (L) 08/29/2022   ALBUMIN 3.1 (L) 08/29/2022   LABGLOB 2.3 01/27/2022   AGRATIO 1.7 01/27/2022   BILITOT 0.6 08/29/2022   ALKPHOS 91 08/29/2022   AST 176 (H) 08/29/2022   ALT 61 (H) 08/29/2022   ANIONGAP 8 08/29/2022    CBG (last 3)  Recent Labs    08/29/22 0306 08/29/22 0731 08/29/22 1153  GLUCAP 470* 407* 293*      Coagulation Profile: No results for input(s): "INR", "PROTIME" in the last 168 hours.   Radiology Studies: CT Head Wo Contrast  Result Date: 08/28/2022 CLINICAL DATA:  Weakness.  Fall this morning. EXAM: CT HEAD WITHOUT CONTRAST CT CERVICAL SPINE WITHOUT CONTRAST TECHNIQUE: Multidetector CT imaging of the head and cervical spine was performed following the standard protocol without intravenous contrast. Multiplanar CT image reconstructions of the cervical spine were also generated. RADIATION DOSE REDUCTION: This exam was performed according to the departmental dose-optimization program which includes automated exposure control, adjustment of the mA and/or kV according to patient size and/or use of iterative reconstruction technique. COMPARISON:  MRI of the brain October 14, 2018. CT of the brain August 09, 2017. FINDINGS: CT HEAD FINDINGS Brain: Chronic infarcts in the left frontal parietal region left basal ganglia, and right thalamus. White matter changes remain. Cerebellum, brainstem, and basal cisterns are otherwise normal. Ventricles and sulci are prominent but stable. No subdural, epidural, or subarachnoid hemorrhage. No mass effect or midline shift. Vascular: Calcified atherosclerotic changes are identified in the intracranial carotids. Skull: Normal. Negative for fracture or focal lesion. Sinuses/Orbits: Mucosal thickening is identified in the maxillary sinuses. Other: No other  abnormalities. CT CERVICAL SPINE FINDINGS Alignment: Normal. Skull base and vertebrae: No acute fracture. No primary bone lesion or focal pathologic process. Soft tissues and spinal canal: No prevertebral fluid or swelling. No visible canal hematoma. Disc levels: Multilevel degenerative disc disease and facet degenerative changes. Upper chest: Negative. Other: No other abnormalities. IMPRESSION: 1. No acute intracranial abnormalities. Chronic infarcts as above. 2. No fracture or traumatic malalignment in the cervical spine. Multilevel degenerative changes. Electronically Signed   By: Dorise Bullion III M.D.   On: 08/28/2022 10:19   CT Cervical Spine Wo Contrast  Result Date: 08/28/2022 CLINICAL DATA:  Weakness.  Fall this morning. EXAM: CT HEAD WITHOUT CONTRAST CT CERVICAL SPINE WITHOUT CONTRAST TECHNIQUE: Multidetector CT imaging of the head and cervical spine was performed following the standard protocol without intravenous contrast. Multiplanar CT image reconstructions of the cervical spine were also generated. RADIATION DOSE REDUCTION: This exam was performed according to the departmental dose-optimization program which includes automated exposure control, adjustment of the mA and/or kV according to patient size and/or use of iterative reconstruction technique. COMPARISON:  MRI of the brain October 14, 2018. CT of the brain August 09, 2017. FINDINGS: CT HEAD FINDINGS Brain: Chronic infarcts in the left frontal parietal region left basal ganglia, and right thalamus. White matter changes remain. Cerebellum, brainstem, and basal cisterns are otherwise normal. Ventricles and sulci are prominent but stable. No subdural, epidural, or subarachnoid hemorrhage. No mass effect or midline shift. Vascular: Calcified atherosclerotic changes are identified in the intracranial carotids. Skull: Normal. Negative for fracture or focal lesion. Sinuses/Orbits: Mucosal thickening is identified in the maxillary sinuses.  Other: No other abnormalities. CT CERVICAL SPINE FINDINGS Alignment: Normal. Skull base and vertebrae: No acute fracture. No primary bone lesion or focal pathologic process. Soft tissues and spinal canal: No prevertebral fluid or  swelling. No visible canal hematoma. Disc levels: Multilevel degenerative disc disease and facet degenerative changes. Upper chest: Negative. Other: No other abnormalities. IMPRESSION: 1. No acute intracranial abnormalities. Chronic infarcts as above. 2. No fracture or traumatic malalignment in the cervical spine. Multilevel degenerative changes. Electronically Signed   By: Dorise Bullion III M.D.   On: 08/28/2022 10:19   DG Pelvis 1-2 Views  Result Date: 08/28/2022 CLINICAL DATA:  81 year old male with weakness and recurrent falls. Pain. EXAM: PELVIS - 1-2 VIEW COMPARISON:  CT Abdomen and Pelvis 02/17/2022. FINDINGS: Chronic moderate to severe asymmetric left hip joint space loss, osteoarthritis. Femoral heads remain normally located. Pelvis appears intact. Grossly intact proximal femurs. Retained stool otherwise negative lower abdominal and pelvic visceral contours. IMPRESSION: No acute fracture or dislocation identified about the pelvis. Left hip osteoarthritis. Electronically Signed   By: Genevie Ann M.D.   On: 08/28/2022 10:02   DG Chest Port 1 View  Result Date: 08/28/2022 CLINICAL DATA:  81 year old male with weakness and recurrent falls. Pain. EXAM: PORTABLE CHEST 1 VIEW COMPARISON:  Chest radiographs 02/16/2022 and earlier. FINDINGS: Portable AP view at 0948 hours. Lordotic view. This exaggerates the cardiac size. Mediastinal contours remain normal. Visualized tracheal air column is within normal limits. Allowing for portable technique the lungs are clear. No pneumothorax. Chronic right rib fractures. Stable visualized osseous structures. IMPRESSION: No acute cardiopulmonary abnormality or acute traumatic injury identified. Electronically Signed   By: Genevie Ann M.D.   On:  08/28/2022 10:00       Hosie Poisson M.D. [facility] 08/29/2022, 4:46 PM  Available via Epic secure chat 7am-7pm After 7 pm, please refer to night coverage provider listed on amion.

## 2022-08-29 NOTE — Evaluation (Signed)
Physical Therapy Evaluation Patient Details Name: [name] MRN: [id number] DOB: [birth year] Today's Date: 08/29/2022  History of Present Illness  81 year old male admit due to weakness and 2 recent falls found to be COVID 19 + . PMH:  persistent atrial fibrillation on Eliquis, chronic diastolic CHF, prior CVA, OSA not on CPAP, hypertension, type 2 diabetes, peripheral artery disease, peripheral vascular disease, hyperlipidemia, morbid obesity. Pt also stated he had a recent Left hip injection last week that seems to be improving his L hip pain.  Clinical Impression  Pt was a bit fearful to try anything at first, however did fairly well with sit to stand and mobility ambulating in the room with the RW. I feel he surprised himself as well. From the history intake , seems patient has become more weak and deconditioned over the past few months. Him and his wife moved into Assisted Living this past August 2023, however at this time seems like he is not getting any " assistance" for anything. He has gotten 1 visit of HHPT . I recommended some rehab to get stronger prior to heading back, however pt is refusing SNF at this time, and feel he could transition likley back to his apartment if he continues to show improvement with HHPT and [place]. Will continue to follow while here and also have mobility Specialist work with pt as well.      Recommendations for follow up therapy are one component of a multi-disciplinary discharge planning process, led by the attending physician.  Recommendations may be updated based on patient status, additional functional criteria and insurance authorization.  Follow Up Recommendations Skilled nursing-short term rehab (<3 hours/day) Can patient physically be transported by private vehicle: Yes    Assistance Recommended at Discharge Intermittent Supervision/Assistance  Patient can return home with the following  A little help with walking and/or transfers;Help with stairs  or ramp for entrance    Equipment Recommendations None recommended by PT  Recommendations for Other Services       Functional Status Assessment Patient has had a recent decline in their functional status and demonstrates the ability to make significant improvements in function in a reasonable and predictable amount of time.     Precautions / Restrictions Precautions Precautions: Fall Precaution Comments: pt stated he fell 2x in last week. Fire departmetn had to get him up and his son had to come get him up as well. He could not get up on his own.      Mobility  Bed Mobility Overal bed mobility: Needs Assistance Bed Mobility: Supine to Sit, Sit to Supine     Supine to sit: Min guard          Transfers Overall transfer level: Needs assistance Equipment used: Rolling walker (2 wheels) Transfers: Sit to/from Stand Sit to Stand: Min guard           General transfer comment: cues to come edge of surfac3 and push up with his hands on surface instead of the recliner. This learning did transfer to the next transfer he performed .    Ambulation/Gait Ambulation/Gait assistance: Min guard Gait Distance (Feet): 40 Feet Assistive device: Rolling walker (2 wheels) Gait Pattern/deviations: Step-through pattern       General Gait Details: small steps , flexed posture  Stairs            Wheelchair Mobility    Modified Rankin (Stroke Patients Only)       Balance Overall balance assessment: Needs assistance Sitting-balance  support: Bilateral upper extremity supported, Feet supported Sitting balance-Leahy Scale: Good     Standing balance support: Bilateral upper extremity supported, During functional activity, Reliant on assistive device for balance Standing balance-Leahy Scale: Fair                               Pertinent Vitals/Pain Pain Assessment Pain Assessment: Faces Faces Pain Scale: Hurts a little bit Pain Location: R lateral rib area  where firemen picked him up off the floor. Pain Descriptors / Indicators: Sore Pain Intervention(s): Limited activity within patient's tolerance, Monitored during session    Home Living Family/patient expects to be discharged to:: Assisted living Living Arrangements: Spouse/significant other               Home Equipment: Other (comment) (lift chair  ( recliner))      Prior Function Prior Level of Function : Needs assist  Cognitive Assist : Mobility (cognitive)           Mobility Comments: Pt uses rollator to walk to / from dining hall ( about 20 meters away) , also uses in in their apartment. He used to ask wife for help with ADLs but sheencoaurges him to do them on his on, so he is trying hard to come up with methods that work for him. He has had OT but sounds like he wants to do things his way. he states he still drives ( ?) However he has had a gradual decline in his ability to mobilize and perform ADLS on his own. Pt also now sleeps in the recliner and has a lift chair.       Hand Dominance        Extremity/Trunk Assessment        Lower Extremity Assessment Lower Extremity Assessment: Generalized weakness       Communication   Communication: No difficulties  Cognition Arousal/Alertness: Awake/alert Behavior During Therapy: WFL for tasks assessed/performed Overall Cognitive Status: Within Functional Limits for tasks assessed                                          General Comments      Exercises     Assessment/Plan    PT Assessment Patient needs continued PT services  PT Problem List Decreased strength;Decreased range of motion;Decreased activity tolerance;Decreased balance;Decreased mobility       PT Treatment Interventions DME instruction;Gait training;Functional mobility training;Therapeutic activities;Therapeutic exercise;Patient/family education    PT Goals (Current goals can be found in the Care Plan section)  Acute Rehab PT  Goals Patient Stated Goal: I want to feel better and get back to my apartment PT Goal Formulation: With patient Time For Goal Achievement: 09/12/22 Potential to Achieve Goals: Good    Frequency Min 3X/week     Co-evaluation               AM-PAC PT "6 Clicks" Mobility  Outcome Measure Help needed turning from your back to your side while in a flat bed without using bedrails?: A Little Help needed moving from lying on your back to sitting on the side of a flat bed without using bedrails?: A Little Help needed moving to and from a bed to a chair (including a wheelchair)?: A Little Help needed standing up from a chair using your arms (e.g., wheelchair or bedside chair)?: A  Little Help needed to walk in hospital room?: A Little Help needed climbing 3-5 steps with a railing? : A Little 6 Click Score: 18    End of Session Equipment Utilized During Treatment: Gait belt Activity Tolerance: Patient tolerated treatment well Patient left: in chair;with chair alarm set;with family/visitor present Nurse Communication: Mobility status PT Visit Diagnosis: Repeated falls (R29.6);Muscle weakness (generalized) (M62.81)    Time: 1660-6004 PT Time Calculation (min) (ACUTE ONLY): 25 min   Charges:   PT Evaluation $PT Eval Low Complexity: 1 Low PT Treatments $Therapeutic Activity: 8-22 mins        Gatha Mayer, PT, MPT Acute Rehabilitation Services Office: [phone] If a weekend: [facility] w/e pager [phone] 08/29/2022   Clide Dales 08/29/2022, 3:21 PM

## 2022-08-29 NOTE — Evaluation (Signed)
Occupational Therapy Evaluation Patient Details Name: [name] MRN: [id number] DOB: 1941/04/23 Today's Date: 08/29/2022   History of Present Illness Patient is a 81 year old male admit due to weakness and 2 recent falls found to be COVID 19 + . PMH:  persistent atrial fibrillation on Eliquis, chronic diastolic CHF, prior CVA, OSA not on CPAP, hypertension, type 2 diabetes, peripheral artery disease, peripheral vascular disease, hyperlipidemia, morbid obesity. Pt also stated he had a recent Left hip injection last week that seems to be improving his L hip pain.   Clinical Impression    Patient is a 81 year old male who was admitted for above. Patient lives at [place] with unclear amount of support at baseline.  Patient was min A for transfer with consistent cues for safety and sequencing of task. Patient was noted to have decreased functional activity tolerance, decreased endurance, decreased standing balance, decreased safety awareness, and decreased knowledge of AD/AE impacting participation in ADLs. Patient would continue to benefit from skilled OT services at this time while admitted and after d/c to address noted deficits in order to improve overall safety and independence in ADLs.       Recommendations for follow up therapy are one component of a multi-disciplinary discharge planning process, led by the attending physician.  Recommendations may be updated based on patient status, additional functional criteria and insurance authorization.   Follow Up Recommendations  Home health OT     Assistance Recommended at Discharge Frequent or constant Supervision/Assistance  Patient can return home with the following A little help with walking and/or transfers;A little help with bathing/dressing/bathroom;Direct supervision/assist for financial management;Help with stairs or ramp for entrance;Assist for transportation;Direct supervision/assist for medications management;Assistance with  cooking/housework    Functional Status Assessment  Patient has had a recent decline in their functional status and demonstrates the ability to make significant improvements in function in a reasonable and predictable amount of time.  Equipment Recommendations  None recommended by OT       Precautions / Restrictions Precautions Precautions: Fall Precaution Comments: pt stated he fell 2x in last week. Fire departmetn had to get him up and his son had to come get him up as well. He could not get up on his own. Restrictions Weight Bearing Restrictions: No      Mobility Bed Mobility Overal bed mobility: Needs Assistance Bed Mobility: Sit to Supine       Sit to supine: Min guard   General bed mobility comments: with cues for sequencing of task       Balance Overall balance assessment: Needs assistance Sitting-balance support: Bilateral upper extremity supported, Feet supported Sitting balance-Leahy Scale: Good     Standing balance support: Bilateral upper extremity supported, During functional activity, Reliant on assistive device for balance Standing balance-Leahy Scale: Poor             ADL either performed or assessed with clinical judgement   ADL Overall ADL's : Needs assistance/impaired Eating/Feeding: Modified independent;Sitting   Grooming: Set up;Sitting   Upper Body Bathing: Set up;Sitting   Lower Body Bathing: Maximal assistance;Sitting/lateral leans   Upper Body Dressing : Minimal assistance;Sitting   Lower Body Dressing: Maximal assistance;Sitting/lateral leans   Toilet Transfer: Minimal assistance;Ambulation;Rolling walker (2 wheels) Toilet Transfer Details (indicate cue type and reason): Patient is a min guard to transfer with RW with consistent cues for sequencing of task. Toileting- Clothing Manipulation and Hygiene: Maximal assistance;Sit to/from stand Toileting - Clothing Manipulation Details (indicate cue type and reason):  unable to let go of  walker in standing for simulated task         Vision   Vision Assessment?: No apparent visual deficits            Pertinent Vitals/Pain Pain Assessment Pain Assessment: Faces Faces Pain Scale: Hurts a little bit Pain Location: R ribs when coughing or movement Pain Descriptors / Indicators: Discomfort, Grimacing Pain Intervention(s): Limited activity within patient's tolerance, Monitored during session     Hand Dominance Right   Extremity/Trunk Assessment Upper Extremity Assessment Upper Extremity Assessment: Overall WFL for tasks assessed   Lower Extremity Assessment Lower Extremity Assessment: Defer to PT evaluation   Cervical / Trunk Assessment Cervical / Trunk Assessment: Normal   Communication Communication Communication: No difficulties   Cognition Arousal/Alertness: Awake/alert Behavior During Therapy: WFL for tasks assessed/performed Overall Cognitive Status: Within Functional Limits for tasks assessed           General Comments: noted to have some confusion but appropirate overall during session.                Home Living Family/patient expects to be discharged to:: Assisted living Living Arrangements: Spouse/significant other             Home Equipment: Other (comment)   Additional Comments: lift chair      Prior Functioning/Environment Prior Level of Function : Needs assist  Cognitive Assist : Mobility (cognitive)           Mobility Comments: Pt uses rollator to walk to / from dining hall ( about 20 meters away) , also uses in in their apartment. He used to ask wife for help with ADLs but sheencoaurges him to do them on his on, so he is trying hard to come up with methods that work for him. He has had OT but sounds like he wants to do things his way. he states he still drives ( ?) However he has had a gradual decline in his ability to mobilize and perform ADLS on his own. Pt also now sleeps in the recliner and has a lift chair. ADLs  Comments: normally able to do bathing and dressing tasks when feeling good. no family in room to assist with determining PLOF        OT Problem List: Decreased strength;Decreased activity tolerance;Decreased coordination;Impaired balance (sitting and/or standing);Decreased safety awareness;Decreased knowledge of use of DME or AE;Decreased knowledge of precautions;Cardiopulmonary status limiting activity      OT Treatment/Interventions: Self-care/ADL training;Energy conservation;Therapeutic exercise;DME and/or AE instruction;Therapeutic activities;Patient/family education;Balance training    OT Goals(Current goals can be found in the care plan section) Acute Rehab OT Goals Patient Stated Goal: to get in bed OT Goal Formulation: Patient unable to participate in goal setting Time For Goal Achievement: 09/12/22 Potential to Achieve Goals: Fair  OT Frequency: Min 2X/week       AM-PAC OT "6 Clicks" Daily Activity     Outcome Measure Help from another person eating meals?: None Help from another person taking care of personal grooming?: A Little Help from another person toileting, which includes using toliet, bedpan, or urinal?: A Lot Help from another person bathing (including washing, rinsing, drying)?: A Lot Help from another person to put on and taking off regular upper body clothing?: A Little Help from another person to put on and taking off regular lower body clothing?: A Lot 6 Click Score: 16   End of Session Equipment Utilized During Treatment: Gait belt;Rolling walker (2 wheels) Nurse Communication: Mobility status  Activity Tolerance: Patient tolerated treatment well Patient left: in bed;with call bell/phone within reach;with bed alarm set  OT Visit Diagnosis: Unsteadiness on feet (R26.81);Other abnormalities of gait and mobility (R26.89);Muscle weakness (generalized) (M62.81)                Time: 8257-4935 OT Time Calculation (min): 18 min Charges:  OT General Charges $OT  Visit: 1 Visit OT Evaluation $OT Eval Moderate Complexity: 1 Mod  Mikeya Tomasetti OTR/L, MS Acute Rehabilitation Department Office# [phone]   Willa Rough 08/29/2022, 4:55 PM

## 2022-08-30 DIAGNOSIS — E785 Hyperlipidemia, unspecified: Secondary | ICD-10-CM | POA: Diagnosis not present

## 2022-08-30 DIAGNOSIS — I10 Essential (primary) hypertension: Secondary | ICD-10-CM

## 2022-08-30 DIAGNOSIS — E1165 Type 2 diabetes mellitus with hyperglycemia: Secondary | ICD-10-CM

## 2022-08-30 DIAGNOSIS — I482 Chronic atrial fibrillation, unspecified: Secondary | ICD-10-CM

## 2022-08-30 DIAGNOSIS — R531 Weakness: Secondary | ICD-10-CM | POA: Diagnosis not present

## 2022-08-30 DIAGNOSIS — U071 COVID-19: Secondary | ICD-10-CM | POA: Diagnosis not present

## 2022-08-30 DIAGNOSIS — Z794 Long term (current) use of insulin: Secondary | ICD-10-CM

## 2022-08-30 LAB — BASIC METABOLIC PANEL
Anion gap: 8 (ref 5–15)
BUN: 31 mg/dL — ABNORMAL HIGH (ref 8–23)
CO2: 26 mmol/L (ref 22–32)
Calcium: 8.7 mg/dL — ABNORMAL LOW (ref 8.9–10.3)
Chloride: 101 mmol/L (ref 98–111)
Creatinine, Ser: 1.26 mg/dL — ABNORMAL HIGH (ref 0.61–1.24)
GFR, Estimated: 58 mL/min — ABNORMAL LOW (ref >60)
Glucose, Bld: 205 mg/dL — ABNORMAL HIGH (ref 70–99)
Potassium: 3.7 mmol/L (ref 3.5–5.1)
Sodium: 135 mmol/L (ref 135–145)

## 2022-08-30 LAB — CBC WITH DIFFERENTIAL/PLATELET
Abs Immature Granulocytes: 0.02 10*3/uL (ref 0.00–0.07)
Basophils Absolute: 0 10*3/uL (ref 0.0–0.1)
Basophils Relative: 0 %
Eosinophils Absolute: 0 10*3/uL (ref 0.0–0.5)
Eosinophils Relative: 0 %
HCT: 30.8 % — ABNORMAL LOW (ref 39.0–52.0)
Hemoglobin: 10.7 g/dL — ABNORMAL LOW (ref 13.0–17.0)
Immature Granulocytes: 0 %
Lymphocytes Relative: 22 %
Lymphs Abs: 1 10*3/uL (ref 0.7–4.0)
MCH: 43.5 pg — ABNORMAL HIGH (ref 26.0–34.0)
MCHC: 34.7 g/dL (ref 30.0–36.0)
MCV: 125.2 fL — ABNORMAL HIGH (ref 80.0–100.0)
Monocytes Absolute: 0.5 10*3/uL (ref 0.1–1.0)
Monocytes Relative: 12 %
Neutro Abs: 2.9 10*3/uL (ref 1.7–7.7)
Neutrophils Relative %: 66 %
Platelets: 528 10*3/uL — ABNORMAL HIGH (ref 150–400)
RBC: 2.46 MIL/uL — ABNORMAL LOW (ref 4.22–5.81)
RDW: 14.2 % (ref 11.5–15.5)
WBC: 4.5 10*3/uL (ref 4.0–10.5)
nRBC: 0 % (ref 0.0–0.2)

## 2022-08-30 LAB — IRON AND TIBC
Iron: 100 ug/dL (ref 45–182)
Saturation Ratios: 42 % — ABNORMAL HIGH (ref 17.9–39.5)
TIBC: 240 ug/dL — ABNORMAL LOW (ref 250–450)
UIBC: 140 ug/dL

## 2022-08-30 LAB — RETICULOCYTES
Immature Retic Fract: 15.4 % (ref 2.3–15.9)
RBC.: 2.44 MIL/uL — ABNORMAL LOW (ref 4.22–5.81)
Retic Count, Absolute: 31.5 10*3/uL (ref 19.0–186.0)
Retic Ct Pct: 1.3 % (ref 0.4–3.1)

## 2022-08-30 LAB — HEMOGLOBIN A1C
Hgb A1c MFr Bld: 6.9 % — ABNORMAL HIGH (ref 4.8–5.6)
Mean Plasma Glucose: 151 mg/dL

## 2022-08-30 LAB — GLUCOSE, CAPILLARY
Glucose-Capillary: 190 mg/dL — ABNORMAL HIGH (ref 70–99)
Glucose-Capillary: 301 mg/dL — ABNORMAL HIGH (ref 70–99)
Glucose-Capillary: 348 mg/dL — ABNORMAL HIGH (ref 70–99)
Glucose-Capillary: 285 mg/dL — ABNORMAL HIGH (ref 70–99)

## 2022-08-30 LAB — FERRITIN: Ferritin: 245 ng/mL (ref 24–336)

## 2022-08-30 LAB — VITAMIN B12: Vitamin B-12: 563 pg/mL (ref 180–914)

## 2022-08-30 LAB — FOLATE: Folate: 15.7 ng/mL (ref >5.9)

## 2022-08-30 NOTE — Inpatient Diabetes Management (Addendum)
Inpatient Diabetes Program Recommendations  AACE/ADA: New Consensus Statement on Inpatient Glycemic Control (2015)  Target Ranges:  Prepandial:   less than 140 mg/dL      Peak postprandial:   less than 180 mg/dL (1-2 hours)      Critically ill patients:  140 - 180 mg/dL   Lab Results  Component Value Date   GLUCAP 348 (H) 08/30/2022   HGBA1C 6.9 (H) 08/29/2022    Review of Glycemic Control  Diabetes history: DM2 Outpatient Diabetes medications: Novolin 70/30 44 units BID, metformin 500 BID Current orders for Inpatient glycemic control: Novolog 70/30 26 units BID, Novolog 0-20 TID with meals and 0-5 HS + 4 units TID  HgbA1C - 6.9%  Inpatient Diabetes Program Recommendations:    Consider discontinuing 70/30 insulin in hospital, per ADA recommendations  Consider adding Semglee 22 units BID  Increase Novolog to 8 units TID with meals if eating > 50%  Titrate daily while on steroids.  Continue to follow.  Thank you. Lorenda Peck, RD, LDN, [place] Inpatient Diabetes Coordinator [phone]

## 2022-08-30 NOTE — TOC Initial Note (Addendum)
Transition of Care [facility]) - Initial/Assessment Note    Patient Details  Name: [name] MRN: [id number] Date of Birth: 1940/12/12  Transition of Care [facility]) CM/SW Contact:    Vassie Moselle, LCSW Phone Number: 08/30/2022, 1:26 PM  Clinical Narrative:                 Met with pt to discuss SNF vs HH recommendation. Pt shares his wife has also tested positive for COVID and he would like to return to his ILF apartment at the [place] to be with her. Pt shares he has receive PT once at the wellness center at the [facility] however, did not go back after his first visit due to "having a lot going on."  Pt is agreeable to having [place] services arranged at discharge. Pt shares that he has a walker at home that was his MIL's. He states it is old and does not work properly. Pt declines having RW ordered in the hospital as he would like to pick out the walker he uses as he has a preference for the color.  CSW contacted the [facility] and confirmed pt is able to return at discharge. Pt will have to complete isolation upon return.  HHPT/OT arranged with [facility]. [place] orders will need to be placed and faxed to [facility] at discharge.   Expected Discharge Plan: [place] Barriers to Discharge: No Barriers Identified   Patient Goals and CMS Choice Patient states their goals for this hospitalization and ongoing recovery are:: To return to the [facility].gov Compare Post Acute Care list provided to:: Patient Choice offered to / list presented to : Patient  Expected Discharge Plan and Services Expected Discharge Plan: [place] In-house Referral: NA Discharge Planning Services: CM Consult Post Acute Care Choice: [place] arrangements for the past 2 months: [place]                 DME Arranged: N/A DME Agency: NA       HH Arranged: PT, OT [place] Agency: Other - See comment Secondary school teacher) Date HH Agency Contacted: 08/30/22 Time [place]: 1497 Representative spoke with at [place]: Rollene Fare  Prior Living Arrangements/Services Living arrangements for the past 2 months: Materials engineer Lives with:: Spouse Patient language and need for interpreter reviewed:: Yes Do you feel safe going back to the place where you live?: Yes      Need for Family Participation in Patient Care: No (Comment) Care giver support system in place?: No (comment) Current home services: DME (RW) Criminal Activity/Legal Involvement Pertinent to Current Situation/Hospitalization: No - Comment as needed  Activities of Daily Living Home Assistive Devices/Equipment: None ADL Screening (condition at time of admission) Patient's cognitive ability adequate to safely complete daily activities?: Yes Is the patient deaf or have difficulty hearing?: No Does the patient have difficulty seeing, even when wearing glasses/contacts?: No Does the patient have difficulty concentrating, remembering, or making decisions?: No Patient able to express need for assistance with ADLs?: Yes Does the patient have difficulty dressing or bathing?: No Independently performs ADLs?: Yes (appropriate for developmental age) Does the patient have difficulty walking or climbing stairs?: No Weakness of Legs: Both Weakness of Arms/Hands: None  Permission Sought/Granted Permission sought to share information with : Facility Sport and exercise psychologist, Case Optician, dispensing granted to share information with : Yes, Verbal Permission Granted     Permission granted to share info w AGENCY: [facility]        Emotional Assessment Appearance:: Appears stated age Attitude/Demeanor/Rapport: Engaged Affect (typically observed): Pleasant Orientation: : Oriented to Self, Oriented to Place, Oriented to  Time, Oriented to Situation Alcohol / Substance Use: Not Applicable Psych Involvement: No (comment)  Admission diagnosis:  COVID-19 virus infection  [U07.1] Patient Active Problem List   Diagnosis Date Noted   COVID-19 virus infection 08/28/2022   Dysarthria 10/14/2018   Atrial fibrillation with RVR ([place]) 10/13/2018   Nodule of upper lobe of right lung 01/31/2018   Diabetic retinopathy ([place]) 10/06/2017   Diabetic polyneuropathy ([place]) 10/06/2017   Morbid obesity ([place]) 09/25/2017   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   Coagulopathy ([place]) 09/25/2017   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   HLD (hyperlipidemia) 08/09/2017   Anxiety 08/09/2017   Chronic diastolic CHF (congestive heart failure) ([place]) 08/09/2017   OA (osteoarthritis) of knee 11/02/2015   Hx of adenomatous colonic polyps 11/03/2014   Paroxysmal atrial fibrillation ([place]) 11/03/2014   Long term current use of anticoagulant therapy 11/03/2014   Obesity (BMI 30-39.9) 07/15/2014   HTN (hypertension) 07/15/2014   Chronic anticoagulation 06/20/2014   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   Obstructive sleep apnea 03/04/2014   DUODENITIS WITHOUT MENTION OF HEMORRHAGE 06/24/2009   Diabetes mellitus type 2, insulin dependent ([place]) 05/22/2009   GERD 05/22/2009   PCP:  [name], MD Pharmacy:   [facility], [address] [place] [address] Phone: [phone] Fax: [phone]     Social Determinants of Health (SDOH) Interventions    Readmission Risk Interventions    08/30/2022    1:24 PM  Readmission Risk Prevention Plan  Transportation Screening Complete  PCP or Specialist Appt within 5-7 Days Complete  Home Care Screening Complete  Medication Review (RN CM) Complete

## 2022-08-30 NOTE — Progress Notes (Signed)
[facility]                                                                               [name], is a 81 y.o. male, DOB - [birth year], TKP:[id number] Admit date - 08/28/2022    Outpatient Primary MD for the patient is Charles Bowen, MD  LOS - 2  days    Brief summary   81 y.o. male with hx of stroke (no residual deficits), paroyxysmal afib on eliquis, diastolic congestive heart failure, prior stroke without deficit, obstructive sleep apnea not on CPAP, hypertension, diabetes mellitus type 2, peripheral artery disease, hyperlipidemia and medication noncompliance presenting to [facility] emergency department via EMS status post fall after the patient has been experiencing weakness as of late.  Upon evaluation in the emergency department patient, while not hypoxic is found to be extremely weak and positive for COVID.  EDP requesting hospitalization.  500 cc normal saline bolus and 125 mg of Solu-Medrol administered in the ED. During workup in the emergency department patient found to have slightly elevated troponin with flat trajectory of elevation on serial troponins, no chest pain and no dynamic ST segment changes noted on EKG.. Pt denies any chest pain.   Assessment & Plan    Assessment and Plan:   Acute respiratory failure with hypoxia sec to COVID 19 infection.  - improving.continue with IV solumedrol and molnupiravir , weaned off oxygen.  - no wheezing on exam today. Cough has improved.  - follow inflammatory markers. Check CRP in am. Procalcitonion is less than 0.1 - cxr is unremarkable.  - PT eval recommending SNF.  - Toc Aware.      Constipation;  No BM yet, will order dulcolax suppository.  Senna, colace, dulcolax and miralax ordered.     Type 2 DM  uncontrolled due to hyperglycemia from steroids.  CBG (last 3)  Recent Labs    08/29/22 2140 08/30/22 0741 08/30/22 1138  GLUCAP 293* 190* 301*    Resume SSI and add novolog  TIDAC. Patient on 44 units of Novolog 70/30    Permanent atrial fib  Rate controlled.  Continue with lopressor and eliquis.    Hypothyroidism  Resume levothyroxine.    BPH  Resume flomax.    Chronic diastolic CHF Resume home meds.  Continue with strict intake and output.     Diabetic neuropathy:  Resume gabapentin.   Hypokalemia:  Replaced.    Hyponatremia:  Mild ,resolved.     Macrocytic anemia:  Check anemia panel.  Hemoglobin stable around 10.     Estimated body mass index is 36.53 kg/m as calculated from the following:   Height as of this encounter: '5\' 11"'$  (1.803 m).   Weight as of this encounter: 118.8 kg.  Code Status: full code.  DVT Prophylaxis:   apixaban (ELIQUIS) tablet 5 mg   Level of Care: Level of care: Telemetry Family Communication: none at bedside.   Disposition Plan:     Remains inpatient appropriate:  IV steroids. Possible d.c in am back to ALF vs SNF.   Procedures:  None.   Consultants:   None.   Antimicrobials:   Anti-infectives (From admission, onward)  Start     Dose/Rate Route Frequency Ordered Stop   08/28/22 2200  nirmatrelvir/ritonavir EUA (renal dosing) (PAXLOVID) 2 tablet  Status:  Discontinued        2 tablet Oral 2 times daily 08/28/22 1727 08/28/22 1737   08/28/22 2200  molnupiravir EUA (LAGEVRIO) capsule 800 mg        4 capsule Oral 2 times daily 08/28/22 1738 09/02/22 2159        Medications  Scheduled Meds:  apixaban  5 mg Oral BID   vitamin C  500 mg Oral Daily   atorvastatin  80 mg Oral q1800   insulin aspart  0-20 Units Subcutaneous TID WC   insulin aspart  0-5 Units Subcutaneous QHS   insulin aspart  4 Units Subcutaneous TID WC   insulin aspart protamine- aspart  26 Units Subcutaneous BID WC   levothyroxine  75 mcg Oral Q0600   methylPREDNISolone (SOLU-MEDROL) injection  40 mg Intravenous Daily   metoprolol tartrate  50 mg Oral BID   molnupiravir EUA  4 capsule Oral BID   multivitamin with  minerals  1 tablet Oral Daily   pantoprazole  40 mg Oral Daily   polyethylene glycol  17 g Oral BID   senna-docusate  2 tablet Oral QHS   tamsulosin  0.4 mg Oral Daily   topiramate  50 mg Oral QHS   torsemide  60 mg Oral Daily   venlafaxine XR  150 mg Oral Daily   zinc sulfate  220 mg Oral Daily   Continuous Infusions: PRN Meds:.acetaminophen **OR** acetaminophen, albuterol, ALPRAZolam, bisacodyl, guaiFENesin-dextromethorphan, hydrALAZINE, Ipratropium-Albuterol, ondansetron **OR** ondansetron (ZOFRAN) IV    Subjective:   [name] was seen and examined today. Reports feeling much better, no chest pain. Cough is improving.   Objective:   Vitals:   08/29/22 1509 08/29/22 2012 08/29/22 2153 08/30/22 0529  BP:  (!) 157/79 (!) 151/79 130/68  Pulse:  65 79 64  Resp:  18  18  Temp:  98.8 F (37.1 C)  98.9 F (37.2 C)  TempSrc:  Oral  Oral  SpO2: 98% 95%  96%  Weight:      Height:        Intake/Output Summary (Last 24 hours) at 08/30/2022 1229 Last data filed at 08/30/2022 0745 Gross per 24 hour  Intake 709 ml  Output 3300 ml  Net -2591 ml    Filed Weights   08/28/22 1749  Weight: 118.8 kg     Exam General exam: Appears calm and comfortable on RA.  Respiratory system: Clear to auscultation. Respiratory effort normal. Cardiovascular system: S1 & S2 heard, RRR. No JVD,  Gastrointestinal system: Abdomen is nondistended, soft and nontender.  Central nervous system: Alert and oriented. No focal neurological deficits. Extremities: Symmetric 5 x 5 power. Skin: No rashes, lesions or ulcers Psychiatry:  Mood & affect appropriate.     Data Reviewed:  I have personally reviewed following labs and imaging studies   CBC Lab Results  Component Value Date   WBC 4.5 08/30/2022   RBC 2.46 (L) 08/30/2022   HGB 10.7 (L) 08/30/2022   HCT 30.8 (L) 08/30/2022   MCV 125.2 (H) 08/30/2022   MCH 43.5 (H) 08/30/2022   PLT 528 (H) 08/30/2022   MCHC 34.7 08/30/2022   RDW 14.2  08/30/2022   LYMPHSABS 1.0 08/30/2022   MONOABS 0.5 08/30/2022   EOSABS 0.0 08/30/2022   BASOSABS 0.0 29/47/6546     Last metabolic panel Lab Results  Component Value Date  NA 135 08/30/2022   K 3.7 08/30/2022   CL 101 08/30/2022   CO2 26 08/30/2022   BUN 31 (H) 08/30/2022   CREATININE 1.26 (H) 08/30/2022   GLUCOSE 205 (H) 08/30/2022   GFRNONAA 58 (L) 08/30/2022   GFRAA 89 08/12/2020   CALCIUM 8.7 (L) 08/30/2022   PROT 6.2 (L) 08/29/2022   ALBUMIN 3.1 (L) 08/29/2022   LABGLOB 2.3 01/27/2022   AGRATIO 1.7 01/27/2022   BILITOT 0.6 08/29/2022   ALKPHOS 91 08/29/2022   AST 176 (H) 08/29/2022   ALT 61 (H) 08/29/2022   ANIONGAP 8 08/30/2022    CBG (last 3)  Recent Labs    08/29/22 2140 08/30/22 0741 08/30/22 1138  GLUCAP 293* 190* 301*       Coagulation Profile: No results for input(s): "INR", "PROTIME" in the last 168 hours.   Radiology Studies: No results found.     Hosie Poisson M.D. [facility] 08/30/2022, 12:29 PM  Available via Epic secure chat 7am-7pm After 7 pm, please refer to night coverage provider listed on amion.

## 2022-08-31 DIAGNOSIS — E119 Type 2 diabetes mellitus without complications: Secondary | ICD-10-CM

## 2022-08-31 DIAGNOSIS — U071 COVID-19: Secondary | ICD-10-CM | POA: Diagnosis not present

## 2022-08-31 DIAGNOSIS — Z794 Long term (current) use of insulin: Secondary | ICD-10-CM | POA: Diagnosis not present

## 2022-08-31 DIAGNOSIS — R531 Weakness: Secondary | ICD-10-CM | POA: Diagnosis not present

## 2022-08-31 LAB — CBC WITH DIFFERENTIAL/PLATELET
Abs Immature Granulocytes: 0.03 10*3/uL (ref 0.00–0.07)
Basophils Absolute: 0 10*3/uL (ref 0.0–0.1)
Basophils Relative: 0 %
Eosinophils Absolute: 0 10*3/uL (ref 0.0–0.5)
Eosinophils Relative: 1 %
HCT: 31.5 % — ABNORMAL LOW (ref 39.0–52.0)
Hemoglobin: 11 g/dL — ABNORMAL LOW (ref 13.0–17.0)
Immature Granulocytes: 1 %
Lymphocytes Relative: 24 %
Lymphs Abs: 1 10*3/uL (ref 0.7–4.0)
MCH: 42.8 pg — ABNORMAL HIGH (ref 26.0–34.0)
MCHC: 34.9 g/dL (ref 30.0–36.0)
MCV: 122.6 fL — ABNORMAL HIGH (ref 80.0–100.0)
Monocytes Absolute: 0.4 10*3/uL (ref 0.1–1.0)
Monocytes Relative: 10 %
Neutro Abs: 2.8 10*3/uL (ref 1.7–7.7)
Neutrophils Relative %: 64 %
Platelets: 519 10*3/uL — ABNORMAL HIGH (ref 150–400)
RBC: 2.57 MIL/uL — ABNORMAL LOW (ref 4.22–5.81)
RDW: 14 % (ref 11.5–15.5)
WBC: 4.4 10*3/uL (ref 4.0–10.5)
nRBC: 0.5 % — ABNORMAL HIGH (ref 0.0–0.2)

## 2022-08-31 LAB — BASIC METABOLIC PANEL
Anion gap: 10 (ref 5–15)
BUN: 32 mg/dL — ABNORMAL HIGH (ref 8–23)
CO2: 28 mmol/L (ref 22–32)
Calcium: 8.8 mg/dL — ABNORMAL LOW (ref 8.9–10.3)
Chloride: 101 mmol/L (ref 98–111)
Creatinine, Ser: 1.34 mg/dL — ABNORMAL HIGH (ref 0.61–1.24)
GFR, Estimated: 54 mL/min — ABNORMAL LOW (ref >60)
Glucose, Bld: 175 mg/dL — ABNORMAL HIGH (ref 70–99)
Potassium: 3.5 mmol/L (ref 3.5–5.1)
Sodium: 139 mmol/L (ref 135–145)

## 2022-08-31 LAB — GLUCOSE, CAPILLARY
Glucose-Capillary: 162 mg/dL — ABNORMAL HIGH (ref 70–99)
Glucose-Capillary: 190 mg/dL — ABNORMAL HIGH (ref 70–99)
Glucose-Capillary: 308 mg/dL — ABNORMAL HIGH (ref 70–99)

## 2022-08-31 LAB — C-REACTIVE PROTEIN: CRP: 0.5 mg/dL (ref <1.0)

## 2022-08-31 MED ORDER — SENNOSIDES-DOCUSATE SODIUM 8.6-50 MG PO TABS: 2.0000 | ORAL_TABLET | Freq: Every evening | ORAL | 0 refills | Status: AC | PRN

## 2022-08-31 MED ORDER — MOLNUPIRAVIR EUA 200MG CAPSULE: 4.0000 | ORAL_CAPSULE | Freq: Two times a day (BID) | ORAL | 0 refills | Status: AC

## 2022-08-31 MED ORDER — ASCORBIC ACID 500 MG PO TABS: 500.0000 mg | ORAL_TABLET | Freq: Every day | ORAL | 0 refills | Status: AC

## 2022-08-31 MED ORDER — ZINC SULFATE 220 (50 ZN) MG PO CAPS: 220.0000 mg | ORAL_CAPSULE | Freq: Every day | ORAL | 0 refills | Status: DC

## 2022-08-31 MED ORDER — ALBUTEROL SULFATE HFA 108 (90 BASE) MCG/ACT IN AERS: 2.0000 | INHALATION_SPRAY | RESPIRATORY_TRACT | 0 refills | Status: DC | PRN

## 2022-08-31 MED ORDER — ACETAMINOPHEN 325 MG PO TABS: 650.0000 mg | ORAL_TABLET | Freq: Four times a day (QID) | ORAL | Status: AC | PRN

## 2022-08-31 NOTE — TOC Transition Note (Signed)
Transition of Care [facility]) - CM/SW Discharge Note   Patient Details  Name: [name] MRN: [id number] Date of Birth: 1941/03/26  [facility]) CM/SW Contact:  Vassie Moselle, LCSW Phone Number: 08/31/2022, 11:19 AM   Clinical Narrative:    Pt is to [facility] to [facility] and will be receiving HHPT/OT through [employer]. HH orders have been faxed to [facility]. Pt recommended for W/C and RW however, pt declining both. Pt agreeable to rollator. Rollator has been ordered through [employer] and will be delivered to pt's room prior to discharge. Pt's son to provide transportation at discharge.    Final next level of care: [place] Barriers to Discharge: No Barriers Identified   Patient Goals and CMS Choice Patient states their goals for this hospitalization and ongoing recovery are:: To return to the [facility].gov Compare Post Acute Care list provided to:: Patient Choice offered to / list presented to : Patient    Discharge Placement                       Discharge Plan and Services In-house Referral: NA Discharge Planning Services: CM Consult Post Acute Care Choice: Home Health          DME Arranged: Walker rolling with seat DME Agency: [employer] Date DME Agency Contacted: 08/31/22 Time DME Agency Contacted: 1119 Representative spoke with at DME Agency: Brenton Grills HH Arranged: PT, OT [place] Agency: Other - See comment Secondary school teacher) Date [place]: 08/30/22 Time [place]: 0962 Representative spoke with at [place]: [place] ([place]) Interventions     Readmission Risk Interventions    08/31/2022   11:18 AM 08/30/2022    1:24 PM  Readmission Risk Prevention Plan  Transportation Screening Complete Complete  PCP or Specialist Appt within 5-7 Days Complete Complete  Home Care Screening Complete Complete  Medication Review (RN CM) Complete Complete

## 2022-08-31 NOTE — Plan of Care (Signed)
  Problem: Nutrition: Goal: Adequate nutrition will be maintained Outcome: Progressing   Problem: Pain Managment: Goal: General experience of comfort will improve Outcome: Progressing   Problem: Safety: Goal: Ability to remain free from injury will improve Outcome: Progressing   

## 2022-08-31 NOTE — Progress Notes (Signed)
Inpatient Diabetes Program Recommendations  AACE/ADA: New Consensus Statement on Inpatient Glycemic Control (2015)  Target Ranges:  Prepandial:   less than 140 mg/dL      Peak postprandial:   less than 180 mg/dL (1-2 hours)      Critically ill patients:  140 - 180 mg/dL   Lab Results  Component Value Date   GLUCAP 190 (H) 08/31/2022   HGBA1C 6.9 (H) 08/29/2022    Review of Glycemic Control  Latest Reference Range & Units 08/30/22 07:41 08/30/22 11:38 08/30/22 16:44 08/30/22 21:57 08/31/22 03:56 08/31/22 07:51  Glucose-Capillary 70 - 99 mg/dL 190 (H) 301 (H) 348 (H) 285 (H) 162 (H) 190 (H)   Diabetes history: DM 2 Outpatient Diabetes medications: Novolin 70/30 44 units BID, metformin 500 BID Current orders for Inpatient glycemic control: Novolog 70/30 26 units BID, Novolog 0-20 TID with meals and 0-5 HS + 4 units TID  Inpatient Diabetes Program Recommendations:    Consider d/c of Novolog meal coverage 4 units and instead increase 70/30 to 30 units bid.   Thanks,  Adah Perl, RN, BC-ADM Inpatient Diabetes Coordinator Pager [phone]  (8a-5p)

## 2022-08-31 NOTE — Discharge Summary (Signed)
Physician Discharge Summary  [name] MBW:[id number] DOB: Jan 26, 1941 DOA: 08/28/2022  PCP: [name], MD  Admit date: 08/28/2022 Discharge date: 08/31/2022 Discharging to: Independent living facility     Discharge Diagnoses:   Principal Problem:   COVID-19 virus infection Active Problems:   Diabetes mellitus type 2, insulin dependent (HCC)   Chronic anticoagulation   Paroxysmal atrial fibrillation (HCC)   Long term current use of anticoagulant therapy   Chronic diastolic CHF (congestive heart failure) (HCC)   Morbid obesity ([place])   Diabetic retinopathy [facility])     Hospital Course:  This is a 81 year old male with paroxysmal atrial fibrillation, CVA, chronic diastolic heart failure, OSA, hypertension and diabetes mellitus who presents from independent living for severe weakness. In the ED he was noted to be positive for COVID.  He was started on IV steroids and also given a 500 cc normal saline bolus.   Principal Problem:   COVID-19 virus infection -The patient will complete a course of molnupiravir - he does not need any further steroids -Of note, his wife has also tested positive for COVID  Severe generalized weakness - Physical therapy has recommended SNF however, the patient has declined this and will be going back to independent living with home health  Morbid obesity  Body mass index is 36.53 kg/m.   Diabetes mellitus type 2, controlled - He is to continue 70/30 insulin Hemoglobin A1C    Component Value Date/Time   HGBA1C 6.9 (H) 08/29/2022 0177             Discharge Instructions  Discharge Instructions     Diet - low sodium heart healthy   Complete by: As directed    Diet Carb Modified   Complete by: As directed    Increase activity slowly   Complete by: As directed       Allergies as of 08/31/2022       Reactions   Penicillin G Sodium Other (See Comments)   Patient was told as a child he is allergic        Medication List      STOP taking these medications    omeprazole 20 MG capsule Commonly known as: PRILOSEC       TAKE these medications    acetaminophen 325 MG tablet Commonly known as: TYLENOL Take 2 tablets (650 mg total) by mouth every 6 (six) hours as needed for mild pain (or Fever >/= 101).   albuterol 108 (90 Base) MCG/ACT inhaler Commonly known as: VENTOLIN HFA Inhale 2 puffs into the lungs every 4 (four) hours as needed for wheezing or shortness of breath.   ALPRAZolam 0.5 MG tablet Commonly known as: XANAX Take 0.5 mg by mouth 2 (two) times daily.   ascorbic acid 500 MG tablet Commonly known as: VITAMIN C Take 1 tablet (500 mg total) by mouth daily.   atorvastatin 80 MG tablet Commonly known as: LIPITOR Take 1 tablet (80 mg total) by mouth daily at 6 PM. What changed: when to take this   Eliquis 5 MG Tabs tablet Generic drug: apixaban Take 1 tablet by mouth twice daily What changed: how much to take   ergocalciferol 1.25 MG (50000 UT) capsule Commonly known as: VITAMIN D2 Take 50,000 Units by mouth every Sunday.   gabapentin 600 MG tablet Commonly known as: NEURONTIN Take 600 mg by mouth every evening.   hydroxyurea 500 MG capsule Commonly known as: HYDREA Take 500 mg by mouth in the morning and at bedtime. May take  with food to minimize GI side effects.   levothyroxine 75 MCG tablet Commonly known as: SYNTHROID Take 75 mcg by mouth daily before breakfast.   metFORMIN 1000 MG tablet Commonly known as: GLUCOPHAGE Take 500 mg by mouth in the morning and at bedtime.   metolazone 2.5 MG tablet Commonly known as: ZAROXOLYN Take 1 tablet (2.5 mg total) by mouth 2 (two) times a week. Take on Tuesdays and Fridays 30 minutes before taking Torsemide. What changed:  when to take this additional instructions   metoprolol tartrate 50 MG tablet Commonly known as: LOPRESSOR Take 50 mg by mouth 2 (two) times daily.   molnupiravir EUA 200 mg Caps capsule Commonly known  as: LAGEVRIO Take 4 capsules (800 mg total) by mouth 2 (two) times daily for 5 days.   multivitamin with minerals Tabs tablet Take 1 tablet by mouth daily with breakfast.   NovoLIN 70/30 ReliOn (70-30) 100 UNIT/ML injection Generic drug: insulin NPH-regular Human Inject 44 Units into the skin in the morning and at bedtime. What changed: Another medication with the same name was removed. Continue taking this medication, and follow the directions you see here.   omeprazole 20 MG tablet Commonly known as: PRILOSEC OTC Take 20 mg by mouth daily before breakfast.   OneTouch Ultra test strip Generic drug: glucose blood USE 1 STRIP TO CHECK GLUCOSE 4 TIMES DAILY   OneTouch Verio test strip Generic drug: glucose blood SMARTSIG:Via Meter   polyethylene glycol powder 17 GM/SCOOP powder Commonly known as: MiraLax Please take 6 capfuls of MiraLAX in a 16 oz bottle of Gatorade over 2-4 hour period. The following day take 3 capfuls. On day 3 start taking 1 capful 3 times a day. Slowly cut back as needed until you have normal bowel movements. What changed:  how much to take how to take this when to take this reasons to take this additional instructions   potassium chloride 10 MEQ tablet Commonly known as: KLOR-CON Take 10 mEq by mouth daily.   senna-docusate 8.6-50 MG tablet Commonly known as: Senokot-S Take 2 tablets by mouth at bedtime as needed for mild constipation.   tamsulosin 0.4 MG Caps capsule Commonly known as: FLOMAX Take 0.4 mg by mouth daily.   topiramate 50 MG tablet Commonly known as: TOPAMAX TAKE 1 TABLET BY MOUTH AT BEDTIME ,  MAY  REPEAT  DOSE  ONE TIME  IF  NEEDED What changed: See the new instructions.   torsemide 20 MG tablet Commonly known as: DEMADEX Take 3 tablets by mouth once daily What changed: when to take this   venlafaxine XR 150 MG 24 hr capsule Commonly known as: EFFEXOR-XR Take 150 mg by mouth daily with breakfast. What changed: Another  medication with the same name was removed. Continue taking this medication, and follow the directions you see here.   zinc sulfate 220 (50 Zn) MG capsule Take 1 capsule (220 mg total) by mouth daily.               Durable Medical Equipment  (From admission, onward)           Start     Ordered   08/31/22 1102  For home use only DME 4 wheeled rolling walker with seat  Once       Question:  Patient needs a walker to treat with the following condition  Answer:  Muscular deconditioning   08/31/22 1101  The results of significant diagnostics from this hospitalization (including imaging, microbiology, ancillary and laboratory) are listed below for reference.    CT Head Wo Contrast  Result Date: 08/28/2022 CLINICAL DATA:  Weakness.  Fall this morning. EXAM: CT HEAD WITHOUT CONTRAST CT CERVICAL SPINE WITHOUT CONTRAST TECHNIQUE: Multidetector CT imaging of the head and cervical spine was performed following the standard protocol without intravenous contrast. Multiplanar CT image reconstructions of the cervical spine were also generated. RADIATION DOSE REDUCTION: This exam was performed according to the departmental dose-optimization program which includes automated exposure control, adjustment of the mA and/or kV according to patient size and/or use of iterative reconstruction technique. COMPARISON:  MRI of the brain October 14, 2018. CT of the brain August 09, 2017. FINDINGS: CT HEAD FINDINGS Brain: Chronic infarcts in the left frontal parietal region left basal ganglia, and right thalamus. White matter changes remain. Cerebellum, brainstem, and basal cisterns are otherwise normal. Ventricles and sulci are prominent but stable. No subdural, epidural, or subarachnoid hemorrhage. No mass effect or midline shift. Vascular: Calcified atherosclerotic changes are identified in the intracranial carotids. Skull: Normal. Negative for fracture or focal lesion. Sinuses/Orbits:  Mucosal thickening is identified in the maxillary sinuses. Other: No other abnormalities. CT CERVICAL SPINE FINDINGS Alignment: Normal. Skull base and vertebrae: No acute fracture. No primary bone lesion or focal pathologic process. Soft tissues and spinal canal: No prevertebral fluid or swelling. No visible canal hematoma. Disc levels: Multilevel degenerative disc disease and facet degenerative changes. Upper chest: Negative. Other: No other abnormalities. IMPRESSION: 1. No acute intracranial abnormalities. Chronic infarcts as above. 2. No fracture or traumatic malalignment in the cervical spine. Multilevel degenerative changes. Electronically Signed   By: Dorise Bullion III M.D.   On: 08/28/2022 10:19   CT Cervical Spine Wo Contrast  Result Date: 08/28/2022 CLINICAL DATA:  Weakness.  Fall this morning. EXAM: CT HEAD WITHOUT CONTRAST CT CERVICAL SPINE WITHOUT CONTRAST TECHNIQUE: Multidetector CT imaging of the head and cervical spine was performed following the standard protocol without intravenous contrast. Multiplanar CT image reconstructions of the cervical spine were also generated. RADIATION DOSE REDUCTION: This exam was performed according to the departmental dose-optimization program which includes automated exposure control, adjustment of the mA and/or kV according to patient size and/or use of iterative reconstruction technique. COMPARISON:  MRI of the brain October 14, 2018. CT of the brain August 09, 2017. FINDINGS: CT HEAD FINDINGS Brain: Chronic infarcts in the left frontal parietal region left basal ganglia, and right thalamus. White matter changes remain. Cerebellum, brainstem, and basal cisterns are otherwise normal. Ventricles and sulci are prominent but stable. No subdural, epidural, or subarachnoid hemorrhage. No mass effect or midline shift. Vascular: Calcified atherosclerotic changes are identified in the intracranial carotids. Skull: Normal. Negative for fracture or focal lesion.  Sinuses/Orbits: Mucosal thickening is identified in the maxillary sinuses. Other: No other abnormalities. CT CERVICAL SPINE FINDINGS Alignment: Normal. Skull base and vertebrae: No acute fracture. No primary bone lesion or focal pathologic process. Soft tissues and spinal canal: No prevertebral fluid or swelling. No visible canal hematoma. Disc levels: Multilevel degenerative disc disease and facet degenerative changes. Upper chest: Negative. Other: No other abnormalities. IMPRESSION: 1. No acute intracranial abnormalities. Chronic infarcts as above. 2. No fracture or traumatic malalignment in the cervical spine. Multilevel degenerative changes. Electronically Signed   By: Dorise Bullion III M.D.   On: 08/28/2022 10:19   DG Pelvis 1-2 Views  Result Date: 08/28/2022 CLINICAL DATA:  81 year old male with weakness and recurrent  falls. Pain. EXAM: PELVIS - 1-2 VIEW COMPARISON:  CT Abdomen and Pelvis 02/17/2022. FINDINGS: Chronic moderate to severe asymmetric left hip joint space loss, osteoarthritis. Femoral heads remain normally located. Pelvis appears intact. Grossly intact proximal femurs. Retained stool otherwise negative lower abdominal and pelvic visceral contours. IMPRESSION: No acute fracture or dislocation identified about the pelvis. Left hip osteoarthritis. Electronically Signed   By: Genevie Ann M.D.   On: 08/28/2022 10:02   DG Chest Port 1 View  Result Date: 08/28/2022 CLINICAL DATA:  81 year old male with weakness and recurrent falls. Pain. EXAM: PORTABLE CHEST 1 VIEW COMPARISON:  Chest radiographs 02/16/2022 and earlier. FINDINGS: Portable AP view at 0948 hours. Lordotic view. This exaggerates the cardiac size. Mediastinal contours remain normal. Visualized tracheal air column is within normal limits. Allowing for portable technique the lungs are clear. No pneumothorax. Chronic right rib fractures. Stable visualized osseous structures. IMPRESSION: No acute cardiopulmonary abnormality or acute  traumatic injury identified. Electronically Signed   By: Genevie Ann M.D.   On: 08/28/2022 10:00   Labs:   Basic Metabolic Panel: Recent Labs  Lab 08/28/22 1007 08/29/22 0613 08/29/22 1234 08/30/22 0701 08/31/22 0611  NA 133* 134* 134* 135 139  K 3.5 3.6 3.4* 3.7 3.5  CL 96* 101 98 101 101  CO2 '31 25 28 26 28  '$ GLUCOSE 133* 431* 285* 205* 175*  BUN 31* 37* 36* 31* 32*  CREATININE 1.53* 1.43* 1.28* 1.26* 1.34*  CALCIUM 9.0 8.8* 8.9 8.7* 8.8*  MG  --  2.3  --   --   --      CBC: Recent Labs  Lab 08/28/22 1007 08/29/22 0613 08/30/22 0701 08/31/22 0611  WBC 6.1 4.9 4.5 4.4  NEUTROABS 4.9 3.9 2.9 2.8  HGB 10.3* 9.4* 10.7* 11.0*  HCT 29.4* 27.3* 30.8* 31.5*  MCV 124.1* 126.4* 125.2* 122.6*  PLT 543* 489* 528* 519*         SIGNED:   Debbe Odea, MD  [facility] 08/31/2022, 12:01 PM

## 2022-09-02 ENCOUNTER — Other Ambulatory Visit: Payer: Self-pay | Admitting: Interventional Cardiology

## 2022-09-02 DIAGNOSIS — I480 Paroxysmal atrial fibrillation: Secondary | ICD-10-CM

## 2022-09-08 ENCOUNTER — Inpatient Hospital Stay: Payer: Medicare HMO | Attending: Hematology and Oncology

## 2022-09-08 ENCOUNTER — Other Ambulatory Visit: Payer: Self-pay | Admitting: Hematology and Oncology

## 2022-09-08 ENCOUNTER — Other Ambulatory Visit: Payer: Self-pay

## 2022-09-08 DIAGNOSIS — D473 Essential (hemorrhagic) thrombocythemia: Secondary | ICD-10-CM

## 2022-09-14 ENCOUNTER — Other Ambulatory Visit: Payer: Self-pay | Admitting: *Deleted

## 2022-09-14 MED ORDER — HYDROXYUREA 500 MG PO CAPS: 500.0000 mg | ORAL_CAPSULE | Freq: Two times a day (BID) | ORAL | 5 refills | Status: DC

## 2022-09-18 ENCOUNTER — Other Ambulatory Visit: Payer: Self-pay | Admitting: Interventional Cardiology

## 2022-10-20 ENCOUNTER — Encounter (HOSPITAL_COMMUNITY): Payer: Self-pay | Admitting: *Deleted

## 2022-10-24 ENCOUNTER — Emergency Department (HOSPITAL_BASED_OUTPATIENT_CLINIC_OR_DEPARTMENT_OTHER)
Admission: EM | Admit: 2022-10-24 | Discharge: 2022-10-24 | Disposition: A | Discharge status: Home / Self Care | Payer: Medicare HMO | Attending: Emergency Medicine | Admitting: Emergency Medicine

## 2022-10-24 ENCOUNTER — Emergency Department (HOSPITAL_BASED_OUTPATIENT_CLINIC_OR_DEPARTMENT_OTHER): Payer: Medicare HMO

## 2022-10-24 ENCOUNTER — Other Ambulatory Visit: Payer: Self-pay

## 2022-10-24 DIAGNOSIS — K5641 Fecal impaction: Secondary | ICD-10-CM

## 2022-10-24 DIAGNOSIS — K5900 Constipation, unspecified: Secondary | ICD-10-CM | POA: Diagnosis not present

## 2022-10-24 LAB — BASIC METABOLIC PANEL
Anion gap: 9 (ref 5–15)
BUN: 26 mg/dL — ABNORMAL HIGH (ref 8–23)
CO2: 29 mmol/L (ref 22–32)
Calcium: 9.1 mg/dL (ref 8.9–10.3)
Chloride: 97 mmol/L — ABNORMAL LOW (ref 98–111)
Creatinine, Ser: 1.53 mg/dL — ABNORMAL HIGH (ref 0.61–1.24)
GFR, Estimated: 46 mL/min — ABNORMAL LOW (ref >60)
Glucose, Bld: 188 mg/dL — ABNORMAL HIGH (ref 70–99)
Potassium: 3.5 mmol/L (ref 3.5–5.1)
Sodium: 135 mmol/L (ref 135–145)

## 2022-10-24 LAB — CBC WITH DIFFERENTIAL/PLATELET
Basophils Absolute: 0.2 10*3/uL — ABNORMAL HIGH (ref 0.0–0.1)
Basophils Relative: 2 %
Eosinophils Absolute: 0.2 10*3/uL (ref 0.0–0.5)
Eosinophils Relative: 2 %
HCT: 36.5 % — ABNORMAL LOW (ref 39.0–52.0)
Hemoglobin: 12.1 g/dL — ABNORMAL LOW (ref 13.0–17.0)
Lymphocytes Relative: 12 %
Lymphs Abs: 1.1 10*3/uL (ref 0.7–4.0)
MCH: 35.7 pg — ABNORMAL HIGH (ref 26.0–34.0)
MCHC: 33.2 g/dL (ref 30.0–36.0)
MCV: 107.7 fL — ABNORMAL HIGH (ref 80.0–100.0)
Monocytes Absolute: 0.8 10*3/uL (ref 0.1–1.0)
Monocytes Relative: 9 %
Neutro Abs: 6.7 10*3/uL (ref 1.7–7.7)
Neutrophils Relative %: 75 %
Platelets: 1009 10*3/uL (ref 150–400)
RBC: 3.39 MIL/uL — ABNORMAL LOW (ref 4.22–5.81)
RDW: 18.3 % — ABNORMAL HIGH (ref 11.5–15.5)
WBC: 9.1 10*3/uL (ref 4.0–10.5)
nRBC: 0.2 % (ref 0.0–0.2)

## 2022-10-24 MED ORDER — FLEET ENEMA 7-19 GM/118ML RE ENEM
1.0000 | ENEMA | Freq: Once | RECTAL | Status: AC
  Administered 2022-10-24: 1 via RECTAL
  Filled 2022-10-24: qty 1

## 2022-10-24 NOTE — ED Triage Notes (Signed)
Pt arrives with c/o constipation that has been going on for about a week. Pt denies n/v. Pt endorse rectal pain. Pt denies ABD pain. Pt used miralax without relief.

## 2022-10-24 NOTE — Discharge Instructions (Signed)
Take 8 scoops of miralax in 32oz of whatever you would like to drink.(Gatorade comes in this size) You can also use a fleets enema which you can buy over the counter at the pharmacy.  Return for worsening abdominal pain, vomiting or fever. ? ?

## 2022-10-24 NOTE — ED Provider Notes (Signed)
[facility] [facility] Provider Note   CSN: [id number] Arrival date & time: 10/24/22  1734     History  Chief Complaint  Patient presents with   Constipation    [name] is a 82 y.o. male.  82 yo M with a chief complaint of constipation.  Tells me that he has been struggling with this off and on for months.  He is not sure why it is occurring to him all of a sudden.  He has not seen his GI doctor in a while but tells me his PCP is try to work on an appointment.  He has tried MiraLAX at home without significant improvement.  He denies significant abdominal pain.  Denies vomiting.   Constipation      Home Medications Prior to Admission medications   Medication Sig Start Date End Date Taking? Authorizing Provider  acetaminophen (TYLENOL) 325 MG tablet Take 2 tablets (650 mg total) by mouth every 6 (six) hours as needed for mild pain (or Fever >/= 101). 08/31/22   Debbe Odea, MD  albuterol (VENTOLIN HFA) 108 (90 Base) MCG/ACT inhaler Inhale 2 puffs into the lungs every 4 (four) hours as needed for wheezing or shortness of breath. 08/31/22   Debbe Odea, MD  ALPRAZolam Duanne Moron) 0.5 MG tablet Take 0.5 mg by mouth 2 (two) times daily.    [provider]  apixaban Arne Cleveland) 5 MG TABS tablet Take 1 tablet by mouth twice daily 09/05/22   Belva Crome, MD  ascorbic acid (VITAMIN C) 500 MG tablet Take 1 tablet (500 mg total) by mouth daily. 08/31/22   Debbe Odea, MD  atorvastatin (LIPITOR) 80 MG tablet Take 1 tablet (80 mg total) by mouth daily at 6 PM. Patient taking differently: Take 80 mg by mouth at bedtime. 08/11/17   Mariel Aloe, MD  benazepril (LOTENSIN) 40 MG tablet Take 1 tablet by mouth once daily for 90    [provider]  ergocalciferol (VITAMIN D2) 50000 units capsule Take 50,000 Units by mouth every Sunday.    [provider]  gabapentin (NEURONTIN) 600 MG tablet Take 600 mg by mouth every evening.  05/06/22   [provider]  glucose blood (ONETOUCH ULTRA) test strip USE 1 STRIP TO CHECK GLUCOSE 4 TIMES DAILY 02/18/20   [provider]  hydroxyurea (HYDREA) 500 MG capsule Take 1 capsule (500 mg total) by mouth in the morning and at bedtime. May take with food to minimize GI side effects. 09/14/22   Orson Slick, MD  levothyroxine (SYNTHROID) 75 MCG tablet Take 75 mcg by mouth daily before breakfast. 03/31/20   [provider]  metFORMIN (GLUCOPHAGE) 1000 MG tablet Take 500 mg by mouth in the morning and at bedtime. 08/27/18   [provider]  metolazone (ZAROXOLYN) 2.5 MG tablet Take 1 tablet (2.5 mg total) by mouth 2 (two) times a week. Take on Tuesdays and Fridays 30 minutes before taking Torsemide. Patient taking differently: Take 2.5 mg by mouth See admin instructions. Take 2.5 mg by mouth on Tuesdays and Fridays- 30 minutes before taking Torsemide 03/21/22   Belva Crome, MD  metoprolol tartrate (LOPRESSOR) 50 MG tablet Take 50 mg by mouth 2 (two) times daily. 05/13/19   [provider]  Multiple Vitamin (MULTI VITAMIN DAILY PO) Take one tablet by mouth every day Oral 09/18/09   [provider]  Multiple Vitamin (MULTIVITAMIN WITH MINERALS) TABS tablet Take 1 tablet by mouth  daily with breakfast.    [provider]  NOVOLIN 70/30 RELION (70-30) 100 UNIT/ML injection Inject 44 Units into the skin in the morning and at bedtime.    [provider]  omeprazole (PRILOSEC OTC) 20 MG tablet Take 20 mg by mouth daily before breakfast.    [provider]  ONETOUCH VERIO test strip SMARTSIG:Via Meter 07/02/20   [provider]  polyethylene glycol powder (MIRALAX) 17 GM/SCOOP powder Please take 6 capfuls of MiraLAX in a 16 oz bottle of Gatorade over 2-4 hour period. The following day take 3 capfuls. On day 3 start taking 1 capful 3 times a day. Slowly cut back as needed until you have normal bowel movements. Patient  taking differently: Take 17 g by mouth daily as needed for mild constipation (mix and drink as directed). 02/17/22   Cardama, Grayce Sessions, MD  potassium chloride (KLOR-CON) 10 MEQ tablet Take 10 mEq by mouth daily.    [provider]  senna-docusate (SENOKOT-S) 8.6-50 MG tablet Take 2 tablets by mouth at bedtime as needed for mild constipation. 08/31/22   Debbe Odea, MD  tamsulosin (FLOMAX) 0.4 MG CAPS capsule Take 0.4 mg by mouth daily. 12/06/20   [provider]  topiramate (TOPAMAX) 50 MG tablet TAKE 1 TABLET BY MOUTH AT BEDTIME ,  MAY  REPEAT  DOSE  ONE TIME  IF  NEEDED Patient taking differently: Take 50 mg by mouth at bedtime. 06/06/22   Frann Rider, NP  torsemide (DEMADEX) 20 MG tablet Take 3 tablets by mouth once daily 09/19/22   Belva Crome, MD  venlafaxine XR (EFFEXOR-XR) 150 MG 24 hr capsule Take 150 mg by mouth daily with breakfast.    [provider]  zinc sulfate 220 (50 Zn) MG capsule Take 1 capsule (220 mg total) by mouth daily. 08/31/22   Debbe Odea, MD      Allergies    Penicillin g sodium    Review of Systems   Review of Systems  Gastrointestinal:  Positive for constipation.    Physical Exam Updated Vital Signs BP (!) 115/58 (BP Location: Left Arm)   Pulse 72   Temp 98.5 F (36.9 C) (Oral)   Resp 18   Wt 117.9 kg   SpO2 95%   BMI 36.26 kg/m  Physical Exam Vitals and nursing note reviewed.  Constitutional:      Appearance: He is well-developed.  HENT:     Head: Normocephalic and atraumatic.  Eyes:     Pupils: Pupils are equal, round, and reactive to light.  Neck:     Vascular: No JVD.  Cardiovascular:     Rate and Rhythm: Normal rate and regular rhythm.     Heart sounds: No murmur heard.    No friction rub. No gallop.  Pulmonary:     Effort: No respiratory distress.     Breath sounds: No wheezing.  Abdominal:     General: There is no distension.     Tenderness: There is no abdominal tenderness. There is no guarding  or rebound.  Genitourinary:    Comments: Large amounts of hard stool in the rectum Musculoskeletal:        General: Normal range of motion.     Cervical back: Normal range of motion and neck supple.  Skin:    Coloration: Skin is not pale.     Findings: No rash.  Neurological:     Mental Status: He is alert and oriented to person, place, and time.  Psychiatric:        Behavior: Behavior normal.     ED Results / Procedures / Treatments   Labs (all labs ordered are listed, but only abnormal results are displayed) Labs Reviewed  CBC WITH DIFFERENTIAL/PLATELET - Abnormal; Notable for the following components:      Result Value   RBC 3.39 (*)    Hemoglobin 12.1 (*)    HCT 36.5 (*)    MCV 107.7 (*)    MCH 35.7 (*)    RDW 18.3 (*)    Platelets 1,009 (*)    Basophils Absolute 0.2 (*)    All other components within normal limits  BASIC METABOLIC PANEL - Abnormal; Notable for the following components:   Chloride 97 (*)    Glucose, Bld 188 (*)    BUN 26 (*)    Creatinine, Ser 1.53 (*)    GFR, Estimated 46 (*)    All other components within normal limits  PATHOLOGIST SMEAR REVIEW    EKG None  Radiology DG Abdomen 1 View  Result Date: 10/24/2022 CLINICAL DATA:  Constipation EXAM: ABDOMEN - 1 VIEW COMPARISON:  CT 02/17/2022.  Pelvic x-ray 08/28/2022. FINDINGS: Moderate diffuse colonic stool. Gas is seen in non dilated loops of small and large bowel. No obstruction. Degenerative changes seen along the spine and pelvis. Particularly of the lumbar spine in the left hip. Curvature of the spine. Nodular density in the right upper quadrant may represent bowel debris. This was not seen on the prior CT. No obstruction or free air. Of note the lateral hemi abdominal edges are clipped off the edge of the film IMPRESSION: 1. Moderate diffuse colonic stool. No obstruction. 2. Nodular density in the right upper quadrant may represent bowel debris. This was not seen on the prior CT. Electronically  Signed   By: Jill Side M.D.   On: 10/24/2022 18:18    Procedures Fecal disimpaction  Date/Time: 10/24/2022 7:01 PM  Performed by: Deno Etienne, DO Authorized by: Deno Etienne, DO  Consent: Verbal consent obtained. Risks and benefits: risks, benefits and alternatives were discussed Consent given by: patient Patient understanding: patient states understanding of the procedure being performed Patient identity confirmed: verbally with patient Time out: Immediately prior to procedure a "time out" was called to verify the correct patient, procedure, equipment, support staff and site/side marked as required. Preparation: Patient was prepped and draped in the usual sterile fashion. Local anesthesia used: no  Anesthesia: Local anesthesia used: no  Sedation: Patient sedated: no  Patient tolerance: patient tolerated the procedure well with no immediate complications       Medications Ordered in ED Medications  sodium phosphate (FLEET) 7-19 GM/118ML enema 1 enema (1 enema Rectal Given 10/24/22 1911)    ED Course/ Medical Decision Making/ A&P                             Medical Decision Making Amount and/or Complexity of Data Reviewed Labs: ordered. Radiology: ordered.  Risk OTC drugs.   82 yo M with a chief complaint of constipation.  This been going on for a little bit over a week now.  He is actually had this problem off and on for months.  He is been seen by his family doctor as well as been to the ER for this previously.  He denies significant abdominal pain denies vomiting.  Pain mostly to the rectum.  No significant electrolyte derangement.  Patient's platelets are significantly  elevated which is probably had chronically.  Feels like he had run out of his medications and started taking them about 3 days ago.  There is a plain film of the abdomen ordered through the triage process, no free air seen on my independent interpretation.  Patient feeling much better after an  enema here.  Would like to go home.  Cleanout at home with MiraLAX.  8:13 PM:  I have discussed the diagnosis/risks/treatment options with the patient.  Evaluation and diagnostic testing in the emergency department does not suggest an emergent condition requiring admission or immediate intervention beyond what has been performed at this time.  They will follow up with PCP, GI. We also discussed returning to the ED immediately if new or worsening sx occur. We discussed the sx which are most concerning (e.g., sudden worsening pain, fever, inability to tolerate by mouth) that necessitate immediate return. Medications administered to the patient during their visit and any new prescriptions provided to the patient are listed below.  Medications given during this visit Medications  sodium phosphate (FLEET) 7-19 GM/118ML enema 1 enema (1 enema Rectal Given 10/24/22 1911)     The patient appears reasonably screen and/or stabilized for discharge and I doubt any other medical condition or other [facility] requiring further screening, evaluation, or treatment in the ED at this time prior to discharge.          Final Clinical Impression(s) / ED Diagnoses Final diagnoses:  Fecal impaction in rectum [facility])    Rx / DC Orders ED Discharge Orders     None         Deno Etienne, DO 10/24/22 2013

## 2022-10-25 ENCOUNTER — Encounter: Payer: Self-pay | Admitting: Internal Medicine

## 2022-10-26 LAB — PATHOLOGIST SMEAR REVIEW: Path Review: INCREASED

## 2022-10-28 ENCOUNTER — Telehealth: Payer: Self-pay | Admitting: Neurology

## 2022-10-28 ENCOUNTER — Other Ambulatory Visit: Payer: Self-pay | Admitting: Neurology

## 2022-10-28 MED ORDER — TOPIRAMATE 50 MG PO TABS: 50.0000 mg | ORAL_TABLET | Freq: Every day | ORAL | 3 refills | Status: DC

## 2022-10-28 MED ORDER — TOPIRAMATE 50 MG PO TABS: ORAL_TABLET | ORAL | 3 refills | Status: DC

## 2022-10-28 NOTE — Telephone Encounter (Signed)
Patient of Dr Clydene Fake , needs TOPIRAMATE called to [place]- 50 mg

## 2022-11-03 NOTE — Progress Notes (Deleted)
[name] [address] [place]. [place] [address] [phone]       OFFICE FOLLOW-UP NOTE  [name] Date of Birth:  1941/05/29 Medical Record Number:  [id number]   Reason for visit: Stroke follow-up [facility] provider: Dr. Leonie Man   No chief complaint on file.    HPI:  Stroke admission 07/2017 PS: [name] is a 82 y.o. male with a history of afib on Xarelto who has been having difficulty speaking since awakening this morning. He states that it seems worse at times, butthese episodes of worsening are only for a few seconds. He has also had two episodes of right sided numbness lasting a few seconds as well. 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 .LKW: 11/27 prior to bed. tpa given?: no, out of window.CT scan of the head showed acute small left frontal MCA territory nonhemorrhagic infarct and moderate changes of small vessel disease. 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 CT angiogram of the brain showed moderate stenosis of left M2 and proximal right posterior cerebral arteries.MRI scan of the brain confirmed a small foci of acute infarcts in the posterior left MCA territory involving posterior frontal and posterior parietal lobes likely emboli. Patient had known history of atrial fibrillation and was on Xarelto and yet had breakthrough infarcts.hemoglobin A1c was elevated at 7.8. Patient was changed from Xarelto to eliquis for second stroke prevention.patient had elevated platelet count of 881,000 which was up from a year ago from 623,000. 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 Bone marrow biopsy was discussed but not done.   10/11/17 visit PS: Patient has been started on hydroxyurea by oncology. Patient states that he still has some intermittent numbness and tingling in his right hand but it is getting  better it occurs once or twice a week and last only 30 seconds. This is often triggered by having his neck or arms in strange positions like stretching backwards. He is tolerating eliquis well without bleeding or bruising. He states his blood pressure is well controlled and today it is 130/79. He continues to have trouble with his sugars which remained high and last hemoglobin A1c was 8.1. Patient has chronic right knee pain and actually had scheduled right knee surgery with Dr. Juliette Alcide in February that now is willing to wait for 6 months since his stroke. He does also have sleep apnea but he has not been compliant with CPAP as he cannot tolerate it. He does have chronic paresthesias in his feet from diabetic neuropathy which is stable   Update 04/17/2018 JM: Patient is being seen today for routine stroke follow-up appointment and overall is doing well from a stroke standpoint.  He continues to take Eliquis without bleeding or bruising for his atrial fibrillation and is managed by his cardiologist.  Continues to take Lipitor without side effects of myalgias.  Blood pressure today satisfactory 128/60.  He does have history of bilateral lower extremity diabetic neuropathy for which he takes gabapentin 300 mg at night.  He continues to have neuropathy pain along with possibly worsening neuropathy with numbness and tingling going up into his calfs.  He has been compliant with gabapentin 300 mg at night but states he does not notice a difference with his neuropathy pain.  He has had a recent fall approximately 1 week ago where he was try  to let his dog outside and when he bent over to help him out the door, he fell forward and landed on his right side.  He denies hitting his head but does have mild residual right sided pain. Patient states his knee limits him from prolonged activity and standing for any length of time along with increased difficulty ambulating.  Patient also has complaints of bilateral lower extremity  swelling.  Patient did speak with cardiologist in regards to this and recommended compression stockings but per notes, he was refusing his treatment and no additional intervention needed.  Patient continues to be noncompliant with CPAP stating he is unable to tolerate machine despite risks of not having OSA treated.  Denies new or worsening stroke/TIA symptoms.  Update 05/22/2019 JM: Mr. Coffing is being seen today for stroke follow-up.  He has been doing well from a stroke standpoint without residual deficits or reoccurring symptoms.  He continues on Eliquis for atrial fibrillation secondary stroke prevention without side effects.  Continues on atorvastatin without myalgias.  Blood pressure today 119/60.  He did undergo right knee arthroplasty on [id number] without complication.  He recently completed physical therapy but is considering participating in additional sessions.  Glucose levels have been stable. Denies new or worsening stroke/TIA symptoms. He has multiple other complaints including blurred vision, leg swelling, and insomnia.  He is routinely being followed by ophthalmology, cardiology and PCP in regards to these concerns.  Update 01/27/2022 : Patient has been referred back to see me today by Constance Holster, PA-C for new complaints of hand tremors and jerkiness.  Patient states this has been going on for a few months.  There is this is not constant but intermittently when he raises his hands he notices sudden jerk and on occasions he is throwing objects he has been holding from his hand like a pill bottle.  This happens mostly in the morning when he is sitting at the breakfast table and trying to hold objects but can occur off and on all day.  He denies any voice tremor or similar jerking in the legs.  Is also noticed increasing falls and has had 5 falls in the last 3 weeks with sustaining bruises on his elbows and hips and thighs.  He does have longstanding diabetic neuropathy with paresthesias with poor  balance as well as some residual right leg weakness from his previous stroke which could also contribute to his balance.  He has started using a cane but does not find it very helpful and feels he may fall even with a cane or walker if he had 1 in the falls or so sudden.  He does take gabapentin 600 mg at supper and quite often ends up taking another 600 mg at night if he is bothered by the paresthesias.  He has not had any recent lab work that I can see in the last lab a month ago had shown borderline creatinine and elevated potassium.  He does have thrombocytosis but this seems to be well controlled on hydroxyurea.  He remains on Eliquis which is tolerating well we will without significant bleeding but does bruise easily and has multiple bruises on his body.  Patient states he has been to physical therapy multiple times and does not find it helpful and is refusing for another referral to improve his gait and balance   UPDATE 05/05/2022 JM: patient returns for follow up after prior visit with Dr. Leonie Man 3 months ago regarding hand tremors and jerkiness. He is accompanied  by his son. Discontinued gabapentin and switched to topamax 78m nightly. Reports improvement of tremors since starting topamax but still has some tremor upon waking but does improve as day progresses. Does topamax in the morning. Denies side effects.  Son has other concerns regarding difficulty falling asleep at night and sleeping til mid afternoon, memory issues with gradual decline over time and continued imbalance.  [facility] [place] [facility] [place]. Feels he has been sleeping better but also does not yet have TV in his room. He does have hx of sleep apnea intolerant to CPAP. Continues to refuse CPAP treatment.  Short term memory forgetting recent conversations or events. Has difficulty remembering his grandchildren's birthdays.  MMSE today 26/30. Previously using cane, family has been trying to encourage  use of rollator walker for chronic multifactoral gait impairment. Denies any worsening since prior visit.    Update 11/07/2022 JM: Patient returns for 63-monthollow-up accompanied by his son.  Reports tremors *** Continues on topiramate 50 mg nightly  Cognition ***         ROS:   14 system review of systems is positive for those listed in HPI and all other systems negative  PMH:  Past Medical History:  Diagnosis Date   Anxiety    Arthritis    Basal cell carcinoma 07/18/1991   Left nasal brdige (MOHS)   Basal cell carcinoma 01/23/1992   lower right back-(CX35FU)   Basal cell carcinoma 05/20/2003   sup-left back (CX35FU)   Basal cell carcinoma 07/28/2011   post lower neck   Basal cell carcinoma 08/20/2008   right sideburn(MOHS), sup-Left upper back (CX35FU), sup-mid back (CX35FU), nod-Right lower back )CX35FU), nod-right upperarm (CX35FU)   Basal cell carcinoma 06/08/2016   sup-Left upper back (CX35FU), mid back (CX35FU), right lower back (CX35FU), nod-Right upperarm (CX35FU)   Depression    Diabetes mellitus without complication (HCC)    GERD (gastroesophageal reflux disease)    Hyperlipidemia    Hypertension    Neuropathy    Obesity    Paroxysmal atrial fibrillation (HCC)    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 (in situ)   Sleep apnea    uses C-pap machine   Squamous cell carcinoma of skin 02/11/2013   in situ-Right temple (CX35FU)   Squamous cell carcinoma of skin 08/20/2008   in situ- front scalp (CX35FU)   Squamous cell carcinoma of skin 06/08/2016   in situ-front scalp (CX35FU)   Squamous cell carcinoma of skin 02/05/2019   in situ-right  sideburn-sup (CX35FU), in situ-right sideburn,inf (CX35FU)   [place]   08/09[place]2017   Superficial basal cell carcinoma (BCC) 01/20/2021   Scalp    Social History:  Social History   Socioeconomic History   Marital status: Married    Spouse name: AnWebb Silversmith Number of children: Not on file   Years of education: Not on file   Highest education level: Not on file  Occupational History   Not on file  Tobacco Use   Smoking status: Former    Packs/day: 1.00    Years: 30.00    Total pack years: 30.00  Types: Cigars, Cigarettes    Quit date: 08/02/1992    Years since quitting: 30.2   Smokeless tobacco: Never  Vaping Use   Vaping Use: Never used  Substance and Sexual Activity   Alcohol use: Yes    Comment: occasionally   Drug use: No   Sexual activity: Not on file  Other Topics Concern   Not on file  Social History Narrative   Lives w wife   R handed   Caffeine: 1 C of coffee a day   Social Determinants of Health   Financial Resource Strain: Not on file  Food Insecurity: No Food Insecurity (08/28/2022)   Hunger Vital Sign    Worried About Running Out of Food in the Last Year: Never true    Ran Out of Food in the Last Year: Never true  Transportation Needs: No Transportation Needs (08/28/2022)   PRAPARE - Hydrologist (Medical): No    Lack of Transportation (Non-Medical): No  Physical Activity: Not on file  Stress: Not on file  Social Connections: Not on file  Intimate Partner Violence: Not At Risk (08/28/2022)   Humiliation, Afraid, Rape, and Kick questionnaire    Fear of Current or Ex-Partner: No    Emotionally Abused: No    Physically Abused: No    Sexually Abused: No    Medications:   Current Outpatient Medications on File Prior to Visit  Medication Sig Dispense Refill   acetaminophen (TYLENOL) 325 MG tablet Take 2 tablets (650 mg total) by mouth every 6 (six) hours as needed for mild pain (or Fever >/= 101).     albuterol  (VENTOLIN HFA) 108 (90 Base) MCG/ACT inhaler Inhale 2 puffs into the lungs every 4 (four) hours as needed for wheezing or shortness of breath. 1 each 0   ALPRAZolam (XANAX) 0.5 MG tablet Take 0.5 mg by mouth 2 (two) times daily.     apixaban (ELIQUIS) 5 MG TABS tablet Take 1 tablet by mouth twice daily 180 tablet 1   ascorbic acid (VITAMIN C) 500 MG tablet Take 1 tablet (500 mg total) by mouth daily. 14 tablet 0   atorvastatin (LIPITOR) 80 MG tablet Take 1 tablet (80 mg total) by mouth daily at 6 PM. (Patient taking differently: Take 80 mg by mouth at bedtime.) 30 tablet 0   benazepril (LOTENSIN) 40 MG tablet Take 1 tablet by mouth once daily for 90     ergocalciferol (VITAMIN D2) 50000 units capsule Take 50,000 Units by mouth every Sunday.     gabapentin (NEURONTIN) 600 MG tablet Take 600 mg by mouth every evening.     glucose blood (ONETOUCH ULTRA) test strip USE 1 STRIP TO CHECK GLUCOSE 4 TIMES DAILY     hydroxyurea (HYDREA) 500 MG capsule Take 1 capsule (500 mg total) by mouth in the morning and at bedtime. May take with food to minimize GI side effects. 60 capsule 5   levothyroxine (SYNTHROID) 75 MCG tablet Take 75 mcg by mouth daily before breakfast.     metFORMIN (GLUCOPHAGE) 1000 MG tablet Take 500 mg by mouth in the morning and at bedtime.     metolazone (ZAROXOLYN) 2.5 MG tablet Take 1 tablet (2.5 mg total) by mouth 2 (two) times a week. Take on Tuesdays and Fridays 30 minutes before taking Torsemide. (Patient taking differently: Take 2.5 mg by mouth See admin instructions. Take 2.5 mg by mouth on Tuesdays and Fridays- 30 minutes before taking Torsemide) 25 tablet 3  metoprolol tartrate (LOPRESSOR) 50 MG tablet Take 50 mg by mouth 2 (two) times daily.     Multiple Vitamin (MULTI VITAMIN DAILY PO) Take one tablet by mouth every day Oral     Multiple Vitamin (MULTIVITAMIN WITH MINERALS) TABS tablet Take 1 tablet by mouth daily with breakfast.     NOVOLIN 70/30 RELION (70-30) 100 UNIT/ML  injection Inject 44 Units into the skin in the morning and at bedtime.     omeprazole (PRILOSEC OTC) 20 MG tablet Take 20 mg by mouth daily before breakfast.     ONETOUCH VERIO test strip SMARTSIG:Via Meter     polyethylene glycol powder (MIRALAX) 17 GM/SCOOP powder Please take 6 capfuls of MiraLAX in a 16 oz bottle of Gatorade over 2-4 hour period. The following day take 3 capfuls. On day 3 start taking 1 capful 3 times a day. Slowly cut back as needed until you have normal bowel movements. (Patient taking differently: Take 17 g by mouth daily as needed for mild constipation (mix and drink as directed).) 255 g 0   potassium chloride (KLOR-CON) 10 MEQ tablet Take 10 mEq by mouth daily.     senna-docusate (SENOKOT-S) 8.6-50 MG tablet Take 2 tablets by mouth at bedtime as needed for mild constipation. 30 tablet 0   tamsulosin (FLOMAX) 0.4 MG CAPS capsule Take 0.4 mg by mouth daily.     topiramate (TOPAMAX) 50 MG tablet Take once a day , may use a second dose if needed for tremor 180 tablet 3   topiramate (TOPAMAX) 50 MG tablet Take 1 tablet (50 mg total) by mouth at bedtime. 30 tablet 3   torsemide (DEMADEX) 20 MG tablet Take 3 tablets by mouth once daily 270 tablet 0   venlafaxine XR (EFFEXOR-XR) 150 MG 24 hr capsule Take 150 mg by mouth daily with breakfast.     zinc sulfate 220 (50 Zn) MG capsule Take 1 capsule (220 mg total) by mouth daily. 14 capsule 0   No current facility-administered medications on file prior to visit.    Allergies:   Allergies  Allergen Reactions   Penicillin G Sodium Other (See Comments)    Patient was told as a child he is allergic    There were no vitals filed for this visit.  There is no height or weight on file to calculate BMI.  Physical Exam General: Obese elderly Caucasian male, , seated, in no evident distress Head: head normocephalic and atraumatic.  Neck: supple with no carotid or supraclavicular bruits Cardiovascular: regular rate and rhythm, no  murmurs;  Musculoskeletal: no deformity Vascular:  Normal pulses all extremities   Neurologic Exam Mental Status: Awake and fully alert.  Fluent speech and language.  Oriented to place and time. Recent impaired and remote memory intact. Attention span, concentration and fund of knowledge appropriate. Mood and affect appropriate.     05/05/2022    3:21 PM  MMSE - Mini Mental State Exam  Orientation to time 4  Orientation to Place 4  Registration 3  Attention/ Calculation 4  Recall 2  Language- name 2 objects 2  Language- repeat 1  Language- follow 3 step command 3  Language- read & follow direction 1  Write a sentence 1  Copy design 1  Total score 26   Cranial Nerves: Pupils equal, briskly reactive to light. Extraocular movements full without nystagmus. Visual fields full to confrontation. Hearing intact. Facial sensation intact. Face, tongue, palate moves normally and symmetrically.  Motor: Normal bulk and tone. Normal  strength in all tested extremity muscles except mild weakness of right ankle dorsiflexors and plantar flexors.. Sensory.:  Decreased sensation to touch ,pinprick and vibratory sensation in bilateral lower extremities distally.   Coordination: Rapid alternating movements normal in all extremities. Finger-to-nose performed accurately and mild difficulty performing heel-to-shin due to pain Gait and Station: Arises from chair with  difficulty.  Gait demonstrates slightly favoring right knee but otherwise broad-based gait with slight imbalance with use of RW Reflexes: 1+ and symmetric except ankle jerks are depressed. Toes downgoing.       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 He also has essential thrombocytosis which which appears well controlled on hydroxyurea.  Vascular risk factors of diabetes, hyperlipidemia, obesity, OSA noncompliant with CPAP,atrial fibrillation  and essential thrombocytosis.  New complaints of upper extremity tremors and myoclonic jerks likely due to metabolic encephalopathy from his mild renal and electrolyte dysfunction as well as medication effect from gabapentin.  Increasing gait and balance difficulties and frequent falls due to combination of underlying diabetic neuropathy as well as damage from stroke and medication effect.  Family notes mild cognitive impairment    - continue topamax 47m but advised to try taking nightly. Advised if he starts to notice worsening tremors midafternoon, may need to consider adding morning dosage -Highly encouraged use of rollator walker for fall prevention, minimal benefit with use of cane -Referral placed for neurocognitive evaluation but suspect vascular etiology contributing to memory concerns -Highly encourage consideration of CPAP for sleep apnea treatment as there are other sleep-related concerns and could also be contributing to memory concerns but unfortunately he continues to decline interest in treatment -Continue to follow closely with PCP and cardiology for aggressive stroke risk factor management and continuation of Eliquis and atorvastatin for secondary stroke prevention measures    Follow-up in 6 months or call earlier if needed    CC:  SReynold Bowen MD    I spent a prolonged 46  minutes of face-to-face and non-face-to-face time with patient and son.  This included previsit chart review, lab review, study review, order entry, electronic health record documentation, patient and son education and discussion regarding above diagnoses and treatment plan and answered all other questions to patient's satisfaction   JFrann Rider ACor[facility] GBar[facility] [address] [id number] Phone [phone] [phone]: This document was prepared with digital dictation and possible smart phrase technology. Any transcriptional errors  that result from this process are unintentional.

## 2022-11-07 ENCOUNTER — Ambulatory Visit: Payer: Medicare HMO | Admitting: Adult Health

## 2022-11-07 ENCOUNTER — Encounter: Payer: Self-pay | Admitting: Adult Health

## 2022-11-09 NOTE — Progress Notes (Unsigned)
[name] [address] [place]. [place] [address] [phone]       OFFICE FOLLOW-UP NOTE  [name] Date of Birth:  1941/04/20 Medical Record Number:  [id number]   Reason for visit: Stroke follow-up [facility] provider: Dr. Leonie Man   No chief complaint on file.    HPI:  Stroke admission 07/2017 PS: [name] is a 82 y.o. male with a history of afib on Xarelto who has been having difficulty speaking since awakening this morning. He states that it seems worse at times, butthese episodes of worsening are only for a few seconds. He has also had two episodes of right sided numbness lasting a few seconds as well. 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 .LKW: 11/27 prior to bed. tpa given?: no, out of window.CT scan of the head showed acute small left frontal MCA territory nonhemorrhagic infarct and moderate changes of small vessel disease. 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 CT angiogram of the brain showed moderate stenosis of left M2 and proximal right posterior cerebral arteries.MRI scan of the brain confirmed a small foci of acute infarcts in the posterior left MCA territory involving posterior frontal and posterior parietal lobes likely emboli. Patient had known history of atrial fibrillation and was on Xarelto and yet had breakthrough infarcts.hemoglobin A1c was elevated at 7.8. Patient was changed from Xarelto to eliquis for second stroke prevention.patient had elevated platelet count of 881,000 which was up from a year ago from 623,000. 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 Bone marrow biopsy was discussed but not done.   10/11/17 visit PS: Patient has been started on hydroxyurea by oncology. Patient states that he still has some intermittent numbness and tingling in his right hand but it is getting  better it occurs once or twice a week and last only 30 seconds. This is often triggered by having his neck or arms in strange positions like stretching backwards. He is tolerating eliquis well without bleeding or bruising. He states his blood pressure is well controlled and today it is 130/79. He continues to have trouble with his sugars which remained high and last hemoglobin A1c was 8.1. Patient has chronic right knee pain and actually had scheduled right knee surgery with Dr. Juliette Alcide in February that now is willing to wait for 6 months since his stroke. He does also have sleep apnea but he has not been compliant with CPAP as he cannot tolerate it. He does have chronic paresthesias in his feet from diabetic neuropathy which is stable   Update 04/17/2018 JM: Patient is being seen today for routine stroke follow-up appointment and overall is doing well from a stroke standpoint.  He continues to take Eliquis without bleeding or bruising for his atrial fibrillation and is managed by his cardiologist.  Continues to take Lipitor without side effects of myalgias.  Blood pressure today satisfactory 128/60.  He does have history of bilateral lower extremity diabetic neuropathy for which he takes gabapentin 300 mg at night.  He continues to have neuropathy pain along with possibly worsening neuropathy with numbness and tingling going up into his calfs.  He has been compliant with gabapentin 300 mg at night but states he does not notice a difference with his neuropathy pain.  He has had a recent fall approximately 1 week ago where he was try  to let his dog outside and when he bent over to help him out the door, he fell forward and landed on his right side.  He denies hitting his head but does have mild residual right sided pain. Patient states his knee limits him from prolonged activity and standing for any length of time along with increased difficulty ambulating.  Patient also has complaints of bilateral lower extremity  swelling.  Patient did speak with cardiologist in regards to this and recommended compression stockings but per notes, he was refusing his treatment and no additional intervention needed.  Patient continues to be noncompliant with CPAP stating he is unable to tolerate machine despite risks of not having OSA treated.  Denies new or worsening stroke/TIA symptoms.  Update 05/22/2019 JM: [name] is being seen today for stroke follow-up.  He has been doing well from a stroke standpoint without residual deficits or reoccurring symptoms.  He continues on Eliquis for atrial fibrillation secondary stroke prevention without side effects.  Continues on atorvastatin without myalgias.  Blood pressure today 119/60.  He did undergo right knee arthroplasty on [id number] without complication.  He recently completed physical therapy but is considering participating in additional sessions.  Glucose levels have been stable. Denies new or worsening stroke/TIA symptoms. He has multiple other complaints including blurred vision, leg swelling, and insomnia.  He is routinely being followed by ophthalmology, cardiology and PCP in regards to these concerns.  Update 01/27/2022 : Patient has been referred back to see me today by Constance Holster, PA-C for new complaints of hand tremors and jerkiness.  Patient states this has been going on for a few months.  There is this is not constant but intermittently when he raises his hands he notices sudden jerk and on occasions he is throwing objects he has been holding from his hand like a pill bottle.  This happens mostly in the morning when he is sitting at the breakfast table and trying to hold objects but can occur off and on all day.  He denies any voice tremor or similar jerking in the legs.  Is also noticed increasing falls and has had 5 falls in the last 3 weeks with sustaining bruises on his elbows and hips and thighs.  He does have longstanding diabetic neuropathy with paresthesias with poor  balance as well as some residual right leg weakness from his previous stroke which could also contribute to his balance.  He has started using a cane but does not find it very helpful and feels he may fall even with a cane or walker if he had 1 in the falls or so sudden.  He does take gabapentin 600 mg at supper and quite often ends up taking another 600 mg at night if he is bothered by the paresthesias.  He has not had any recent lab work that I can see in the last lab a month ago had shown borderline creatinine and elevated potassium.  He does have thrombocytosis but this seems to be well controlled on hydroxyurea.  He remains on Eliquis which is tolerating well we will without significant bleeding but does bruise easily and has multiple bruises on his body.  Patient states he has been to physical therapy multiple times and does not find it helpful and is refusing for another referral to improve his gait and balance   UPDATE 05/05/2022 JM: patient returns for follow up after prior visit with Dr. Leonie Man 3 months ago regarding hand tremors and jerkiness. He is accompanied  by his son. Discontinued gabapentin and switched to topamax '50mg'$  nightly. Reports improvement of tremors since starting topamax but still has some tremor upon waking but does improve as day progresses. Does topamax in the morning. Denies side effects.  Son has other concerns regarding difficulty falling asleep at night and sleeping til mid afternoon, memory issues with gradual decline over time and continued imbalance.  He and his wife moved to [facility] senior living last week. Feels he has been sleeping better but also does not yet have TV in his room. He does have hx of sleep apnea intolerant to CPAP. Continues to refuse CPAP treatment.  Short term memory forgetting recent conversations or events. Has difficulty remembering his grandchildren's birthdays.  MMSE today 26/30. Previously using cane, family has been trying to encourage  use of rollator walker for chronic multifactoral gait impairment. Denies any worsening since prior visit.    Update 11/09/2022 JM: Patient returns for 46-monthfollow-up accompanied by his son.  Reports tremors *** Continues on topiramate 50 mg nightly  Cognition ***         ROS:   14 system review of systems is positive for those listed in HPI and all other systems negative  PMH:  Past Medical History:  Diagnosis Date   Anxiety    Arthritis    Basal cell carcinoma 07/18/1991   Left nasal brdige (MOHS)   Basal cell carcinoma 01/23/1992   lower right back-(CX35FU)   Basal cell carcinoma 05/20/2003   sup-left back (CX35FU)   Basal cell carcinoma 07/28/2011   post lower neck   Basal cell carcinoma 08/20/2008   right sideburn(MOHS), sup-Left upper back (CX35FU), sup-mid back (CX35FU), nod-Right lower back )CX35FU), nod-right upperarm (CX35FU)   Basal cell carcinoma 06/08/2016   sup-Left upper back (CX35FU), mid back (CX35FU), right lower back (CX35FU), nod-Right upperarm (CX35FU)   Depression    Diabetes mellitus without complication (HCC)    GERD (gastroesophageal reflux disease)    Hyperlipidemia    Hypertension    Neuropathy    Obesity    Paroxysmal atrial fibrillation (HCC)    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 (in situ)   Sleep apnea    uses C-pap machine   Squamous cell carcinoma of skin 02/11/2013   in situ-Right temple (CX35FU)   Squamous cell carcinoma of skin 08/20/2008   in situ- front scalp (CX35FU)   Squamous cell carcinoma of skin 06/08/2016   in situ-front scalp (CX35FU)   Squamous cell carcinoma of skin 02/05/2019   in situ-right  sideburn-sup (CX35FU), in situ-right sideburn,inf (CX35FU)   [place]   Superficial basal cell carcinoma (BCC) 01/20/2021   Scalp    Social History:  Social History   Socioeconomic History   Marital status: Married    Spouse name: [name]  Number of children: Not on file   Years of education: Not on file   Highest education level: Not on file  Occupational History   Not on file  Tobacco Use   Smoking status: Former    Packs/day: 1.00    Years: 30.00    Total pack years: 30.00  Types: Cigars, Cigarettes    Quit date: 08/02/1992    Years since quitting: 30.2   Smokeless tobacco: Never  Vaping Use   Vaping Use: Never used  Substance and Sexual Activity   Alcohol use: Yes    Comment: occasionally   Drug use: No   Sexual activity: Not on file  Other Topics Concern   Not on file  Social History Narrative   Lives w wife   R handed   Caffeine: 1 C of coffee a day   Social Determinants of Health   Financial Resource Strain: Not on file  Food Insecurity: No Food Insecurity (08/28/2022)   Hunger Vital Sign    Worried About Running Out of Food in the Last Year: Never true    Ran Out of Food in the Last Year: Never true  Transportation Needs: No Transportation Needs (08/28/2022)   PRAPARE - Hydrologist (Medical): No    Lack of Transportation (Non-Medical): No  Physical Activity: Not on file  Stress: Not on file  Social Connections: Not on file  Intimate Partner Violence: Not At Risk (08/28/2022)   Humiliation, Afraid, Rape, and Kick questionnaire    Fear of Current or Ex-Partner: No    Emotionally Abused: No    Physically Abused: No    Sexually Abused: No    Medications:   Current Outpatient Medications on File Prior to Visit  Medication Sig Dispense Refill   acetaminophen (TYLENOL) 325 MG tablet Take 2 tablets (650 mg total) by mouth every 6 (six) hours as needed for mild pain (or Fever >/= 101).     albuterol  (VENTOLIN HFA) 108 (90 Base) MCG/ACT inhaler Inhale 2 puffs into the lungs every 4 (four) hours as needed for wheezing or shortness of breath. 1 each 0   ALPRAZolam (XANAX) 0.5 MG tablet Take 0.5 mg by mouth 2 (two) times daily.     apixaban (ELIQUIS) 5 MG TABS tablet Take 1 tablet by mouth twice daily 180 tablet 1   ascorbic acid (VITAMIN C) 500 MG tablet Take 1 tablet (500 mg total) by mouth daily. 14 tablet 0   atorvastatin (LIPITOR) 80 MG tablet Take 1 tablet (80 mg total) by mouth daily at 6 PM. (Patient taking differently: Take 80 mg by mouth at bedtime.) 30 tablet 0   benazepril (LOTENSIN) 40 MG tablet Take 1 tablet by mouth once daily for 90     ergocalciferol (VITAMIN D2) 50000 units capsule Take 50,000 Units by mouth every Sunday.     gabapentin (NEURONTIN) 600 MG tablet Take 600 mg by mouth every evening.     glucose blood (ONETOUCH ULTRA) test strip USE 1 STRIP TO CHECK GLUCOSE 4 TIMES DAILY     hydroxyurea (HYDREA) 500 MG capsule Take 1 capsule (500 mg total) by mouth in the morning and at bedtime. May take with food to minimize GI side effects. 60 capsule 5   levothyroxine (SYNTHROID) 75 MCG tablet Take 75 mcg by mouth daily before breakfast.     metFORMIN (GLUCOPHAGE) 1000 MG tablet Take 500 mg by mouth in the morning and at bedtime.     metolazone (ZAROXOLYN) 2.5 MG tablet Take 1 tablet (2.5 mg total) by mouth 2 (two) times a week. Take on Tuesdays and Fridays 30 minutes before taking Torsemide. (Patient taking differently: Take 2.5 mg by mouth See admin instructions. Take 2.5 mg by mouth on Tuesdays and Fridays- 30 minutes before taking Torsemide) 25 tablet 3  metoprolol tartrate (LOPRESSOR) 50 MG tablet Take 50 mg by mouth 2 (two) times daily.     Multiple Vitamin (MULTI VITAMIN DAILY PO) Take one tablet by mouth every day Oral     Multiple Vitamin (MULTIVITAMIN WITH MINERALS) TABS tablet Take 1 tablet by mouth daily with breakfast.     NOVOLIN 70/30 RELION (70-30) 100 UNIT/ML  injection Inject 44 Units into the skin in the morning and at bedtime.     omeprazole (PRILOSEC OTC) 20 MG tablet Take 20 mg by mouth daily before breakfast.     ONETOUCH VERIO test strip SMARTSIG:Via Meter     polyethylene glycol powder (MIRALAX) 17 GM/SCOOP powder Please take 6 capfuls of MiraLAX in a 16 oz bottle of Gatorade over 2-4 hour period. The following day take 3 capfuls. On day 3 start taking 1 capful 3 times a day. Slowly cut back as needed until you have normal bowel movements. (Patient taking differently: Take 17 g by mouth daily as needed for mild constipation (mix and drink as directed).) 255 g 0   potassium chloride (KLOR-CON) 10 MEQ tablet Take 10 mEq by mouth daily.     senna-docusate (SENOKOT-S) 8.6-50 MG tablet Take 2 tablets by mouth at bedtime as needed for mild constipation. 30 tablet 0   tamsulosin (FLOMAX) 0.4 MG CAPS capsule Take 0.4 mg by mouth daily.     topiramate (TOPAMAX) 50 MG tablet Take once a day , may use a second dose if needed for tremor 180 tablet 3   topiramate (TOPAMAX) 50 MG tablet Take 1 tablet (50 mg total) by mouth at bedtime. 30 tablet 3   torsemide (DEMADEX) 20 MG tablet Take 3 tablets by mouth once daily 270 tablet 0   venlafaxine XR (EFFEXOR-XR) 150 MG 24 hr capsule Take 150 mg by mouth daily with breakfast.     zinc sulfate 220 (50 Zn) MG capsule Take 1 capsule (220 mg total) by mouth daily. 14 capsule 0   No current facility-administered medications on file prior to visit.    Allergies:   Allergies  Allergen Reactions   Penicillin G Sodium Other (See Comments)    Patient was told as a child he is allergic    There were no vitals filed for this visit.  There is no height or weight on file to calculate BMI.  Physical Exam General: Obese elderly Caucasian male, , seated, in no evident distress Head: head normocephalic and atraumatic.  Neck: supple with no carotid or supraclavicular bruits Cardiovascular: regular rate and rhythm, no  murmurs;  Musculoskeletal: no deformity Vascular:  Normal pulses all extremities   Neurologic Exam Mental Status: Awake and fully alert.  Fluent speech and language.  Oriented to place and time. Recent impaired and remote memory intact. Attention span, concentration and fund of knowledge appropriate. Mood and affect appropriate.     05/05/2022    3:21 PM  MMSE - Mini Mental State Exam  Orientation to time 4  Orientation to Place 4  Registration 3  Attention/ Calculation 4  Recall 2  Language- name 2 objects 2  Language- repeat 1  Language- follow 3 step command 3  Language- read & follow direction 1  Write a sentence 1  Copy design 1  Total score 26   Cranial Nerves: Pupils equal, briskly reactive to light. Extraocular movements full without nystagmus. Visual fields full to confrontation. Hearing intact. Facial sensation intact. Face, tongue, palate moves normally and symmetrically.  Motor: Normal bulk and tone. Normal  strength in all tested extremity muscles except mild weakness of right ankle dorsiflexors and plantar flexors.. Sensory.:  Decreased sensation to touch ,pinprick and vibratory sensation in bilateral lower extremities distally.   Coordination: Rapid alternating movements normal in all extremities. Finger-to-nose performed accurately and mild difficulty performing heel-to-shin due to pain Gait and Station: Arises from chair with  difficulty.  Gait demonstrates slightly favoring right knee but otherwise broad-based gait with slight imbalance with use of RW Reflexes: 1+ and symmetric except ankle jerks are depressed. Toes downgoing.       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 He also has essential thrombocytosis which which appears well controlled on hydroxyurea.  Vascular risk factors of diabetes, hyperlipidemia, obesity, OSA noncompliant with CPAP,atrial fibrillation  and essential thrombocytosis.  New complaints of upper extremity tremors and myoclonic jerks likely due to metabolic encephalopathy from his mild renal and electrolyte dysfunction as well as medication effect from gabapentin.  Increasing gait and balance difficulties and frequent falls due to combination of underlying diabetic neuropathy as well as damage from stroke and medication effect.  Family notes mild cognitive impairment    - continue topamax '50mg'$  but advised to try taking nightly. Advised if he starts to notice worsening tremors midafternoon, may need to consider adding morning dosage -Highly encouraged use of rollator walker for fall prevention, minimal benefit with use of cane -Referral placed for neurocognitive evaluation but suspect vascular etiology contributing to memory concerns -Highly encourage consideration of CPAP for sleep apnea treatment as there are other sleep-related concerns and could also be contributing to memory concerns but unfortunately he continues to decline interest in treatment -Continue to follow closely with PCP and cardiology for aggressive stroke risk factor management and continuation of Eliquis and atorvastatin for secondary stroke prevention measures    Follow-up in 6 months or call earlier if needed    CC:  [name], MD    I spent a prolonged 46  minutes of face-to-face and non-face-to-face time with patient and son.  This included previsit chart review, lab review, study review, order entry, electronic health record documentation, patient and son education and discussion regarding above diagnoses and treatment plan and answered all other questions to patient's satisfaction   Frann Rider, [facility]  [facility] [address] [place], [place] [address]  Phone [phone] Fax [phone] Note: This document was prepared with digital dictation and possible smart phrase technology. Any transcriptional errors  that result from this process are unintentional.

## 2022-11-10 ENCOUNTER — Ambulatory Visit (INDEPENDENT_AMBULATORY_CARE_PROVIDER_SITE_OTHER): Payer: Medicare HMO | Admitting: Adult Health

## 2022-11-10 ENCOUNTER — Encounter: Payer: Self-pay | Admitting: Adult Health

## 2022-11-10 VITALS — BP 112/64 | HR 62 | Ht 71.75 in

## 2022-11-10 DIAGNOSIS — R4189 Other symptoms and signs involving cognitive functions and awareness: Secondary | ICD-10-CM | POA: Diagnosis not present

## 2022-11-10 DIAGNOSIS — I63512 Cerebral infarction due to unspecified occlusion or stenosis of left middle cerebral artery: Secondary | ICD-10-CM

## 2022-11-10 DIAGNOSIS — R269 Unspecified abnormalities of gait and mobility: Secondary | ICD-10-CM

## 2022-11-10 DIAGNOSIS — R251 Tremor, unspecified: Secondary | ICD-10-CM

## 2022-11-10 NOTE — Patient Instructions (Addendum)
Your Plan:  Continue topiramate 50 mg nightly for tremor management  -please let me know if tremors or jerks should worsen and we can discuss increasing topamax dosage      Follow up in 1 year or call earlier if needed      Thank you for coming to see [place] at [facility]. I hope we have been able to provide you high quality care today.  You may receive a patient satisfaction survey over the next few weeks. We would appreciate your feedback and comments so that we may continue to improve ourselves and the health of our patients.

## 2022-11-16 DIAGNOSIS — E1142 Type 2 diabetes mellitus with diabetic polyneuropathy: Secondary | ICD-10-CM | POA: Diagnosis not present

## 2022-11-16 DIAGNOSIS — L97311 Non-pressure chronic ulcer of right ankle limited to breakdown of skin: Secondary | ICD-10-CM | POA: Diagnosis not present

## 2022-11-16 DIAGNOSIS — M25473 Effusion, unspecified ankle: Secondary | ICD-10-CM | POA: Diagnosis not present

## 2022-12-07 ENCOUNTER — Ambulatory Visit: Payer: Medicare HMO | Admitting: Internal Medicine

## 2022-12-08 DIAGNOSIS — L570 Actinic keratosis: Secondary | ICD-10-CM | POA: Diagnosis not present

## 2022-12-08 DIAGNOSIS — D2261 Melanocytic nevi of right upper limb, including shoulder: Secondary | ICD-10-CM | POA: Diagnosis not present

## 2022-12-08 DIAGNOSIS — D485 Neoplasm of uncertain behavior of skin: Secondary | ICD-10-CM | POA: Diagnosis not present

## 2022-12-08 DIAGNOSIS — D2262 Melanocytic nevi of left upper limb, including shoulder: Secondary | ICD-10-CM | POA: Diagnosis not present

## 2022-12-08 DIAGNOSIS — L821 Other seborrheic keratosis: Secondary | ICD-10-CM | POA: Diagnosis not present

## 2022-12-08 DIAGNOSIS — D0462 Carcinoma in situ of skin of left upper limb, including shoulder: Secondary | ICD-10-CM | POA: Diagnosis not present

## 2022-12-12 DIAGNOSIS — Z794 Long term (current) use of insulin: Secondary | ICD-10-CM | POA: Diagnosis not present

## 2022-12-12 DIAGNOSIS — M25473 Effusion, unspecified ankle: Secondary | ICD-10-CM | POA: Diagnosis not present

## 2022-12-12 DIAGNOSIS — L97311 Non-pressure chronic ulcer of right ankle limited to breakdown of skin: Secondary | ICD-10-CM | POA: Diagnosis not present

## 2022-12-12 DIAGNOSIS — E1142 Type 2 diabetes mellitus with diabetic polyneuropathy: Secondary | ICD-10-CM | POA: Diagnosis not present

## 2022-12-12 DIAGNOSIS — Z7984 Long term (current) use of oral hypoglycemic drugs: Secondary | ICD-10-CM | POA: Diagnosis not present

## 2022-12-26 ENCOUNTER — Other Ambulatory Visit: Payer: Self-pay

## 2022-12-26 DIAGNOSIS — E1142 Type 2 diabetes mellitus with diabetic polyneuropathy: Secondary | ICD-10-CM | POA: Diagnosis not present

## 2022-12-26 DIAGNOSIS — M25471 Effusion, right ankle: Secondary | ICD-10-CM | POA: Diagnosis not present

## 2022-12-26 DIAGNOSIS — L97311 Non-pressure chronic ulcer of right ankle limited to breakdown of skin: Secondary | ICD-10-CM | POA: Diagnosis not present

## 2022-12-26 MED ORDER — TORSEMIDE 20 MG PO TABS: 60.0000 mg | ORAL_TABLET | Freq: Every day | ORAL | 2 refills | Status: DC

## 2023-01-18 DIAGNOSIS — E1142 Type 2 diabetes mellitus with diabetic polyneuropathy: Secondary | ICD-10-CM | POA: Diagnosis not present

## 2023-01-18 DIAGNOSIS — Z96652 Presence of left artificial knee joint: Secondary | ICD-10-CM | POA: Diagnosis not present

## 2023-01-18 DIAGNOSIS — R600 Localized edema: Secondary | ICD-10-CM | POA: Diagnosis not present

## 2023-01-18 DIAGNOSIS — L97311 Non-pressure chronic ulcer of right ankle limited to breakdown of skin: Secondary | ICD-10-CM | POA: Diagnosis not present

## 2023-01-18 DIAGNOSIS — M25552 Pain in left hip: Secondary | ICD-10-CM | POA: Diagnosis not present

## 2023-01-20 ENCOUNTER — Ambulatory Visit (INDEPENDENT_AMBULATORY_CARE_PROVIDER_SITE_OTHER)
Admission: RE | Admit: 2023-01-20 | Discharge: 2023-01-20 | Disposition: A | Discharge status: Home / Self Care | Payer: Medicare HMO | Source: Ambulatory Visit | Attending: Nurse Practitioner | Admitting: Nurse Practitioner

## 2023-01-20 ENCOUNTER — Encounter: Payer: Self-pay | Admitting: Nurse Practitioner

## 2023-01-20 ENCOUNTER — Ambulatory Visit: Payer: Medicare HMO | Admitting: Nurse Practitioner

## 2023-01-20 VITALS — BP 120/60 | HR 70 | Ht 71.0 in | Wt 277.0 lb

## 2023-01-20 DIAGNOSIS — K5900 Constipation, unspecified: Secondary | ICD-10-CM

## 2023-01-20 NOTE — Progress Notes (Signed)
01/20/2023 [name] [id number] [birth year]   CHIEF COMPLAINT: Constipation   HISTORY OF PRESENT ILLNESS: [name]. Reigner is a 82 year old male with a past medical history of anxiety, depression, hypertension, hyperlipidemia, paroxysmal atrial fibrillation on Eliquis, diastolic CHF, CVA 2018, essential thrombocytosis, chronic macrocytic anemia, OSA (does not use CPAP), numerous squamous and basal cell skin lesions s/p excisions, diverticulosis and colon polyps. He presents to our office today for further evaluation regarding constipation.  He is concerned about having a blockage in his colon.  He presented to the ED 10/24/2022 due to having constipation. Rectal exam by the ED physician identified a large amount of stool in the rectum. He received an enema and he was manually disimpacted and discharged home.  Since then, he intermittently passes hard balls of stool but for the past week he has passed a normal formed bowel movement no longer feels constipated.  He denies having any nausea or vomiting.  No abdominal pain or unusual abdominal distention.  He takes MiraLAX if he does not pass a bowel movement in 2 days.  He typically does not see any blood with his bowel movements but he passed a bowel movement today then used a diaper wipe and saw a few specks of blood on it.  He lives in assisted living with his wife and 2 meals daily are prepared.  He admits to drinking insufficient quantity of water per day. His last colonoscopy was 05/12/2015 identified a single small tubular adenomatous polyp removed from the ascending colon mild diverticulosis in the left colon otherwise was normal.  No further colon polyp surveillance colonoscopies were recommended due to age.  No GERD symptoms on Omeprazole 20 mg daily.  In review of his Epic  records, he was seen in the ED 02/17/2022 with constipation and was assessed to have a mild fecal impaction that was manually removed.      Latest Ref Rng & Units  10/24/2022    5:46 PM 08/31/2022    6:11 AM 08/30/2022    7:01 AM  CBC  WBC 4.0 - 10.5 K/uL 9.1  4.4  4.5   Hemoglobin 13.0 - 17.0 g/dL 40.9  81.1  91.4   Hematocrit 39.0 - 52.0 % 36.5  31.5  30.8   Platelets 150 - 400 K/uL 1,009  519  528        Latest Ref Rng & Units 10/24/2022    5:46 PM 08/31/2022    6:11 AM 08/30/2022    7:01 AM  CMP  Glucose 70 - 99 mg/dL 782  956  213   BUN 8 - 23 mg/dL 26  32  31   Creatinine 0.61 - 1.24 mg/dL 0.86  5.78  4.69   Sodium 135 - 145 mmol/L 135  139  135   Potassium 3.5 - 5.1 mmol/L 3.5  3.5  3.7   Chloride 98 - 111 mmol/L 97  101  101   CO2 22 - 32 mmol/L 29  28  26    Calcium 8.9 - 10.3 mg/dL 9.1  8.8  8.7      Past Medical History:  Diagnosis Date   Anxiety    Arthritis    Basal cell carcinoma 07/18/1991   Left nasal brdige (MOHS)   Basal cell carcinoma 01/23/1992   lower right back-(CX35FU)   Basal cell carcinoma 05/20/2003   sup-left back (CX35FU)   Basal cell carcinoma 07/28/2011   post lower neck   Basal cell carcinoma 08/20/2008  right sideburn(MOHS), sup-Left upper back (CX35FU), sup-mid back (CX35FU), nod-Right lower back )CX35FU), nod-right upperarm (CX35FU)   Basal cell carcinoma 06/08/2016   sup-Left upper back (CX35FU), mid back (CX35FU), right lower back (CX35FU), nod-Right upperarm (CX35FU)   Depression    Diabetes mellitus without complication (HCC)    GERD (gastroesophageal reflux disease)    Hyperlipidemia    Hypertension    Neuropathy    Obesity    Paroxysmal atrial fibrillation (HCC)    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 (in situ)   Sleep apnea    uses C-pap machine   Squamous cell carcinoma of  skin 02/11/2013   in situ-Right temple (CX35FU)   Squamous cell carcinoma of skin 08/20/2008   in situ- front scalp (CX35FU)   Squamous cell carcinoma of skin 06/08/2016   in situ-front scalp (CX35FU)   Squamous cell carcinoma of skin 02/05/2019   in situ-right sideburn-sup (CX35FU), in situ-right sideburn,inf (CX35FU)   Stroke (HCC)    08/09/2017   Superficial basal cell carcinoma (BCC) 01/20/2021   Scalp   Past Surgical History:  Procedure Laterality Date   APPENDECTOMY  1962   BACK SURGERY  00-02-12   x3   BASAL CELL CARCINOMA EXCISION  93/06/10   COLONOSCOPY     KNEE ARTHROSCOPY  005/01/02   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 His smoking use included cigars and cigarettes. He has a 30.00 pack-year smoking history. He has never used smokeless tobacco. He reports current alcohol use. He reports that he does not use drugs.  Family History: family history includes CVA in his father; Cancer in his mother; Diabetes Mellitus II in his mother; Heart failure in his father; Hypertension in his mother and sister.   Allergies  Allergen Reactions   Penicillin G Sodium Other (See Comments)    Patient was told as a child he is allergic      Outpatient Encounter Medications as of 01/20/2023  Medication Sig   acetaminophen (TYLENOL) 325 MG tablet Take 2 tablets (650 mg total) by mouth every 6 (six) hours as needed for mild pain (or Fever >/= 101).   ALPRAZolam (XANAX) 0.5 MG tablet Take 0.5 mg by mouth 2 (two) times daily.   apixaban (ELIQUIS) 5 MG TABS tablet Take 1 tablet by mouth twice daily    ascorbic acid (VITAMIN C) 500 MG tablet Take 1 tablet (500 mg total) by mouth daily.   atorvastatin (LIPITOR) 80 MG tablet Take 1 tablet (80 mg total) by mouth daily at 6 PM. (Patient taking differently: Take 80 mg by mouth at bedtime.)   benazepril (LOTENSIN) 40 MG tablet Take 1 tablet by mouth once daily for 90   ergocalciferol (VITAMIN D2) 50000 units capsule Take 50,000 Units by mouth every Sunday.  gabapentin (NEURONTIN) 600 MG tablet Take 600 mg by mouth every evening.   glucose blood (ONETOUCH ULTRA) test strip USE 1 STRIP TO CHECK GLUCOSE 4 TIMES DAILY   hydroxyurea (HYDREA) 500 MG capsule Take 1 capsule (500 mg total) by mouth in the morning and at bedtime. May take with food to minimize GI side effects.   levothyroxine (SYNTHROID) 75 MCG tablet Take 75 mcg by mouth daily before breakfast.   metFORMIN (GLUCOPHAGE) 1000 MG tablet Take 500 mg by mouth in the morning and at bedtime.   metolazone (ZAROXOLYN) 2.5 MG tablet Take 1 tablet (2.5 mg total) by mouth 2 (two) times a week. Take on Tuesdays and Fridays 30 minutes before taking Torsemide. (Patient taking differently: Take 2.5 mg by mouth See admin instructions. Take 2.5 mg by mouth on Tuesdays and Fridays- 30 minutes before taking Torsemide)   metoprolol tartrate (LOPRESSOR) 50 MG tablet Take 50 mg by mouth 2 (two) times daily.   Multiple Vitamin (MULTIVITAMIN WITH MINERALS) TABS tablet Take 1 tablet by mouth daily with breakfast.   NOVOLIN 70/30 RELION (70-30) 100 UNIT/ML injection Inject 44 Units into the skin in the morning and at bedtime.   omeprazole (PRILOSEC OTC) 20 MG tablet Take 20 mg by mouth daily before breakfast.   ONETOUCH VERIO test strip SMARTSIG:Via Meter   polyethylene glycol powder (MIRALAX) 17 GM/SCOOP powder Please take 6 capfuls of MiraLAX in a 16 oz bottle of Gatorade over 2-4 hour period. The following day take 3 capfuls. On day 3 start taking 1 capful 3 times a day. Slowly cut back as needed until you have normal  bowel movements. (Patient taking differently: Take 17 g by mouth daily as needed for mild constipation (mix and drink as directed).)   potassium chloride (KLOR-CON) 10 MEQ tablet Take 10 mEq by mouth daily.   senna-docusate (SENOKOT-S) 8.6-50 MG tablet Take 2 tablets by mouth at bedtime as needed for mild constipation.   tamsulosin (FLOMAX) 0.4 MG CAPS capsule Take 0.4 mg by mouth daily.   topiramate (TOPAMAX) 50 MG tablet Take 1 tablet (50 mg total) by mouth at bedtime.   torsemide (DEMADEX) 20 MG tablet Take 3 tablets (60 mg total) by mouth daily.   venlafaxine XR (EFFEXOR-XR) 150 MG 24 hr capsule Take 150 mg by mouth daily with breakfast.   Multiple Vitamin (MULTI VITAMIN DAILY PO) Take one tablet by mouth every day Oral (Patient not taking: Reported on 01/20/2023)   [DISCONTINUED] topiramate (TOPAMAX) 50 MG tablet Take once a day , may use a second dose if needed for tremor (Patient not taking: Reported on 01/20/2023)   No facility-administered encounter medications on file as of 01/20/2023.    REVIEW OF SYSTEMS:  Gen: Denies fever, sweats or chills. No weight loss.  CV: Denies chest pain, palpitations or edema. Resp: Denies cough, shortness of breath of hemoptysis.  GI: Denies heartburn, dysphagia, stomach or lower abdominal pain. No diarrhea or constipation.  GU :+ Urinary incontinence, wears a depends undergarment.  MS: + Bilateral knee pain. Derm: Denies rash, itchiness, skin lesions or unhealing ulcers. Psych: Denies depression, anxiety, memory loss or confusion. Heme: Denies bruising, easy bleeding. Neuro:  Denies headaches, dizziness or paresthesias. Endo:  Denies any problems with DM, thyroid or adrenal function.  PHYSICAL EXAM: BP 120/60   Pulse 70   Ht 5\' 11"  (1.803 m)   Wt 277 lb (125.6 kg)   SpO2 98%   BMI 38.63 kg/m  General: 82 year old frail appearing obese  male in no acute distress. Slow gait, ambulating with the assistance of a walker.  Head: Normocephalic and  atraumatic. Eyes:  Sclerae non-icteric, conjunctive pink. Ears: Normal auditory acuity. Mouth: Dentition intact. No ulcers or lesions.  Neck: Supple, no lymphadenopathy or thyromegaly.  Lungs: Clear bilaterally to auscultation without wheezes, crackles or rhonchi. Heart: Regular rate and rhythm. No murmur, rub or gallop appreciated.  Abdomen: Soft, nontender, nondistended. No masses. No hepatosplenomegaly. Normoactive bowel sounds x 4 quadrants.  Rectal: Patient declined exam.  Musculoskeletal: Symmetrical with no gross deformities. Skin: Warm and dry. No rash or lesions on visible extremities. Extremities: No edema. Neurological: Alert oriented x 4, no focal deficits.  Psychological:  Alert and cooperative. Normal mood and affect.  ASSESSMENT AND PLAN:  82 year old male chronic constipation with recurrent fecal impaction. -Patient encouraged to take MiraLAX nightly to avoid hard stools and to facilitate daily stool output -Dulcolax OTC suppository 1 PR q. third night as needed -Drink 4 to 6 glasses of water daily -Dietary fiber as tolerated -Abdominal x-ray to rule out stool-filled colon, low suspicion for obstruction  History of a hyperplastic polyp per colonoscopy in 2016 -No further colonoscopies recommended due to age  Atrial fib on Eliquis   GERD, stable on Omeprazole 20mg  QD  Essential thrombocytosis followed by hematology  Chronic macrocytic anemia.  Hemoglobin 12.1.  B12 level 563. Iron 100, saturation ratios 42  with TIBC 240 indicates anemia of chronic disease.   CC:  Adrian Prince, MD

## 2023-01-20 NOTE — Progress Notes (Deleted)
Referring Provider: Adrian Prince, MD Primary Care Physician:  Adrian Prince, MD Primary Gastroenterologist:  Dr.  Jaquita Rector for Consultation:  ***  HPI: [name] is a 82 y.o. male   GI PROCEDURES:  Past Medical History:  Diagnosis Date   Anxiety    Arthritis    Basal cell carcinoma 07/18/1991   Left nasal brdige (MOHS)   Basal cell carcinoma 01/23/1992   lower right back-(CX35FU)   Basal cell carcinoma 05/20/2003   sup-left back (CX35FU)   Basal cell carcinoma 07/28/2011   post lower neck   Basal cell carcinoma 08/20/2008   right sideburn(MOHS), sup-Left upper back (CX35FU), sup-mid back (CX35FU), nod-Right lower back )CX35FU), nod-right upperarm (CX35FU)   Basal cell carcinoma 06/08/2016   sup-Left upper back (CX35FU), mid back (CX35FU), right lower back (CX35FU), nod-Right upperarm (CX35FU)   Depression    Diabetes mellitus without complication (HCC)    GERD (gastroesophageal reflux disease)    Hyperlipidemia    Hypertension    Neuropathy    Obesity    Paroxysmal atrial fibrillation (HCC)    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 (in situ)   Sleep apnea    uses C-pap machine   Squamous cell carcinoma of skin 02/11/2013   in situ-Right temple (CX35FU)   Squamous cell carcinoma of skin 08/20/2008   in situ- front scalp (CX35FU)   Squamous cell carcinoma of skin 06/08/2016   in situ-front scalp (CX35FU)   Squamous cell carcinoma of skin 02/05/2019   in situ-right sideburn-sup (CX35FU), in situ-right sideburn,inf (CX35FU)   Stroke (HCC)    08/09/2017   Superficial basal cell carcinoma (BCC) 01/20/2021   Scalp    Past Surgical History:  Procedure  Laterality Date   APPENDECTOMY  1962   BACK SURGERY  00-02-12   x3   BASAL CELL CARCINOMA EXCISION  93/06/10   COLONOSCOPY     KNEE ARTHROSCOPY  005/01/02   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 Authorizing Provider  acetaminophen (TYLENOL) 325 MG tablet Take 2 tablets (650 mg total) by mouth every 6 (six) hours as needed for mild pain (or Fever >/= 101). 08/31/22  Yes Calvert Cantor, MD  ALPRAZolam Prudy Feeler) 0.5 MG tablet Take 0.5 mg by mouth 2 (two) times daily.   Yes [provider]  apixaban (ELIQUIS) 5 MG TABS tablet Take 1 tablet by mouth twice daily 09/05/22  Yes Lyn Records, MD  ascorbic acid (VITAMIN C) 500 MG tablet Take 1 tablet (500 mg total) by mouth daily. 08/31/22  Yes Calvert Cantor, MD  atorvastatin (LIPITOR) 80 MG tablet Take 1 tablet (80 mg total) by mouth daily at 6 PM. Patient taking differently: Take 80 mg by mouth at bedtime. 08/11/17  Yes Narda Bonds, MD  benazepril (  LOTENSIN) 40 MG tablet Take 1 tablet by mouth once daily for 90   Yes [provider]  ergocalciferol (VITAMIN D2) 50000 units capsule Take 50,000 Units by mouth every Sunday.   Yes [provider]  gabapentin (NEURONTIN) 600 MG tablet Take 600 mg by mouth every evening. 05/06/22  Yes [provider]  glucose blood (ONETOUCH ULTRA) test strip USE 1 STRIP TO CHECK GLUCOSE 4 TIMES DAILY 02/18/20  Yes [provider]  hydroxyurea (HYDREA) 500 MG capsule Take 1 capsule (500 mg total) by mouth in the morning and at bedtime. May take with food to minimize GI side effects.  09/14/22  Yes Jaci Standard, MD  levothyroxine (SYNTHROID) 75 MCG tablet Take 75 mcg by mouth daily before breakfast. 03/31/20  Yes [provider]  metFORMIN (GLUCOPHAGE) 1000 MG tablet Take 500 mg by mouth in the morning and at bedtime. 08/27/18  Yes [provider]  metolazone (ZAROXOLYN) 2.5 MG tablet Take 1 tablet (2.5 mg total) by mouth 2 (two) times a week. Take on Tuesdays and Fridays 30 minutes before taking Torsemide. Patient taking differently: Take 2.5 mg by mouth See admin instructions. Take 2.5 mg by mouth on Tuesdays and Fridays- 30 minutes before taking Torsemide 03/21/22  Yes Lyn Records, MD  metoprolol tartrate (LOPRESSOR) 50 MG tablet Take 50 mg by mouth 2 (two) times daily. 05/13/19  Yes [provider]  Multiple Vitamin (MULTIVITAMIN WITH MINERALS) TABS tablet Take 1 tablet by mouth daily with breakfast.   Yes [provider]  NOVOLIN 70/30 RELION (70-30) 100 UNIT/ML injection Inject 44 Units into the skin in the morning and at bedtime.   Yes [provider]  omeprazole (PRILOSEC OTC) 20 MG tablet Take 20 mg by mouth daily before breakfast.   Yes [provider]  ONETOUCH VERIO test strip SMARTSIG:Via Meter 07/02/20  Yes [provider]  polyethylene glycol powder (MIRALAX) 17 GM/SCOOP powder Please take 6 capfuls of MiraLAX in a 16 oz bottle of Gatorade over 2-4 hour period. The following day take 3 capfuls. On day 3 start taking 1 capful 3 times a day. Slowly cut back as needed until you have normal bowel movements. Patient taking differently: Take 17 g by mouth daily as needed for mild constipation (mix and drink as directed). 02/17/22  Yes Cardama, Amadeo Garnet, MD  potassium chloride (KLOR-CON) 10 MEQ tablet Take 10 mEq by mouth daily.   Yes [provider]  senna-docusate (SENOKOT-S) 8.6-50 MG tablet Take 2 tablets by mouth at bedtime as needed for mild constipation. 08/31/22  Yes Calvert Cantor, MD   tamsulosin (FLOMAX) 0.4 MG CAPS capsule Take 0.4 mg by mouth daily. 12/06/20  Yes [provider]  topiramate (TOPAMAX) 50 MG tablet Take 1 tablet (50 mg total) by mouth at bedtime. 10/28/22  Yes Micki Riley, MD  torsemide (DEMADEX) 20 MG tablet Take 3 tablets (60 mg total) by mouth daily. 12/26/22  Yes Janetta Hora, PA-C  venlafaxine XR (EFFEXOR-XR) 150 MG 24 hr capsule Take 150 mg by mouth daily with breakfast.   Yes [provider]  Multiple Vitamin (MULTI VITAMIN DAILY PO) Take one tablet by mouth every day Oral Patient not taking: Reported on 01/20/2023 09/18/09   [provider]    Current Outpatient Medications  Medication Sig Dispense Refill   acetaminophen (TYLENOL) 325 MG tablet Take 2 tablets (650 mg total) by mouth every 6 (six) hours as needed for mild pain (or Fever >/=  101).     ALPRAZolam (XANAX) 0.5 MG tablet Take 0.5 mg by mouth 2 (two) times daily.     apixaban (ELIQUIS) 5 MG TABS tablet Take 1 tablet by mouth twice daily 180 tablet 1   ascorbic acid (VITAMIN C) 500 MG tablet Take 1 tablet (500 mg total) by mouth daily. 14 tablet 0   atorvastatin (LIPITOR) 80 MG tablet Take 1 tablet (80 mg total) by mouth daily at 6 PM. (Patient taking differently: Take 80 mg by mouth at bedtime.) 30 tablet 0   benazepril (LOTENSIN) 40 MG tablet Take 1 tablet by mouth once daily for 90     ergocalciferol (VITAMIN D2) 50000 units capsule Take 50,000 Units by mouth every Sunday.     gabapentin (NEURONTIN) 600 MG tablet Take 600 mg by mouth every evening.     glucose blood (ONETOUCH ULTRA) test strip USE 1 STRIP TO CHECK GLUCOSE 4 TIMES DAILY     hydroxyurea (HYDREA) 500 MG capsule Take 1 capsule (500 mg total) by mouth in the morning and at bedtime. May take with food to minimize GI side effects. 60 capsule 5   levothyroxine (SYNTHROID) 75 MCG tablet Take 75 mcg by mouth daily before breakfast.     metFORMIN (GLUCOPHAGE) 1000 MG tablet Take 500 mg by mouth in  the morning and at bedtime.     metolazone (ZAROXOLYN) 2.5 MG tablet Take 1 tablet (2.5 mg total) by mouth 2 (two) times a week. Take on Tuesdays and Fridays 30 minutes before taking Torsemide. (Patient taking differently: Take 2.5 mg by mouth See admin instructions. Take 2.5 mg by mouth on Tuesdays and Fridays- 30 minutes before taking Torsemide) 25 tablet 3   metoprolol tartrate (LOPRESSOR) 50 MG tablet Take 50 mg by mouth 2 (two) times daily.     Multiple Vitamin (MULTIVITAMIN WITH MINERALS) TABS tablet Take 1 tablet by mouth daily with breakfast.     NOVOLIN 70/30 RELION (70-30) 100 UNIT/ML injection Inject 44 Units into the skin in the morning and at bedtime.     omeprazole (PRILOSEC OTC) 20 MG tablet Take 20 mg by mouth daily before breakfast.     ONETOUCH VERIO test strip SMARTSIG:Via Meter     polyethylene glycol powder (MIRALAX) 17 GM/SCOOP powder Please take 6 capfuls of MiraLAX in a 16 oz bottle of Gatorade over 2-4 hour period. The following day take 3 capfuls. On day 3 start taking 1 capful 3 times a day. Slowly cut back as needed until you have normal bowel movements. (Patient taking differently: Take 17 g by mouth daily as needed for mild constipation (mix and drink as directed).) 255 g 0   potassium chloride (KLOR-CON) 10 MEQ tablet Take 10 mEq by mouth daily.     senna-docusate (SENOKOT-S) 8.6-50 MG tablet Take 2 tablets by mouth at bedtime as needed for mild constipation. 30 tablet 0   tamsulosin (FLOMAX) 0.4 MG CAPS capsule Take 0.4 mg by mouth daily.     topiramate (TOPAMAX) 50 MG tablet Take 1 tablet (50 mg total) by mouth at bedtime. 30 tablet 3   torsemide (DEMADEX) 20 MG tablet Take 3 tablets (60 mg total) by mouth daily. 270 tablet 2   venlafaxine XR (EFFEXOR-XR) 150 MG 24 hr capsule Take 150 mg by mouth daily with breakfast.     Multiple Vitamin (MULTI VITAMIN DAILY PO) Take one tablet by mouth every day Oral (Patient not taking: Reported on 01/20/2023)     No current  facility-administered medications  for this visit.    Allergies as of 01/20/2023 - Review Complete 01/20/2023  Allergen Reaction Noted   Penicillin g sodium Other (See Comments) 12/28/2020    Family History  Problem Relation Age of Onset   Cancer Mother    Diabetes Mellitus II Mother    Hypertension Mother    Heart failure Father    CVA Father    Hypertension Sister    Colon cancer Neg Hx     Social History   Socioeconomic History   Marital status: Married    Spouse name: Thurston Hole   Number of children: Not on file   Years of education: Not on file   Highest education level: Not on file  Occupational History   Not on file  Tobacco Use   Smoking status: Former    Packs/day: 1.00    Years: 30.00    Additional pack years: 0.00    Total pack years: 30.00    Types: Cigars, Cigarettes    Quit date: 08/02/1992    Years since quitting: 30.4   Smokeless tobacco: Never  Vaping Use   Vaping Use: Never used  Substance and Sexual Activity   Alcohol use: Yes    Comment: occasionally   Drug use: No   Sexual activity: Not on file  Other Topics Concern   Not on file  Social History Narrative   Lives w wife   R handed   Caffeine: 1 C of coffee a day   Social Determinants of Health   Financial Resource Strain: Not on file  Food Insecurity: No Food Insecurity (08/28/2022)   Hunger Vital Sign    Worried About Running Out of Food in the Last Year: Never true    Ran Out of Food in the Last Year: Never true  Transportation Needs: No Transportation Needs (08/28/2022)   PRAPARE - Administrator, Civil Service (Medical): No    Lack of Transportation (Non-Medical): No  Physical Activity: Not on file  Stress: Not on file  Social Connections: Not on file  Intimate Partner Violence: Not At Risk (08/28/2022)   Humiliation, Afraid, Rape, and Kick questionnaire    Fear of Current or Ex-Partner: No    Emotionally Abused: No    Physically Abused: No    Sexually Abused: No     Review of Systems: Gen: Denies fever, sweats or chills. No weight loss.  CV: Denies chest pain, palpitations or edema. Resp: Denies cough, shortness of breath of hemoptysis.  GI: Denies heartburn, dysphagia, stomach or lower abdominal pain. No diarrhea or constipation. No rectal bleeding or melena.   GU : Denies urinary burning, blood in urine, increased urinary frequency or incontinence. MS: Denies joint pain, muscles aches or weakness. Derm: Denies rash, itchiness, skin lesions or unhealing ulcers. Psych: Denies depression, anxiety, memory loss or confusion. Heme: Denies easy bruising, bleeding. Neuro:  Denies headaches, dizziness or paresthesias. Endo:  Denies any problems with DM, thyroid or adrenal function.  Physical Exam: Vital signs in last 24 hours: @VSRANGES @   General:  Alert, well-developed, well-nourished, pleasant and cooperative in NAD. Head:  Normocephalic and atraumatic. Eyes:  No scleral icterus. Conjunctiva pink. Ears:  Normal auditory acuity. Nose:  No deformity, discharge or lesions. Mouth:  Dentition intact. No ulcers or lesions.  Neck:  Supple. No lymphadenopathy or thyromegaly.  Lungs: Breath sounds clear throughout. No wheezes, rhonchi or crackles.  Heart:   Abdomen:   Rectal: Deferred. Musculoskeletal:  Symmetrical without gross deformities.  Pulses:  Normal pulses noted. Extremities:  Without clubbing or edema. Neurologic:  Alert and  oriented x 4. No focal deficits.  Skin:  Intact without significant lesions or rashes. Psych:  Alert and cooperative. Normal mood and affect.  Intake/Output from previous day: No intake/output data recorded. Intake/Output this shift: @IOTHISSHIFT @  Lab Results: No results for input(s): "WBC", "HGB", "HCT", "PLT" in the last 72 hours. BMET No results for input(s): "NA", "K", "CL", "CO2", "GLUCOSE", "BUN", "CREATININE", "CALCIUM" in the last 72 hours. LFT No results for input(s): "PROT", "ALBUMIN", "AST",  "ALT", "ALKPHOS", "BILITOT", "BILIDIR", "IBILI" in the last 72 hours. PT/INR No results for input(s): "LABPROT", "INR" in the last 72 hours. Hepatitis Panel No results for input(s): "HEPBSAG", "HCVAB", "HEPAIGM", "HEPBIGM" in the last 72 hours.    Studies/Results: No results found.  IMPRESSION/PLAN:   Arnaldo Natal  01/20/2023, 3:05 PM

## 2023-01-20 NOTE — Patient Instructions (Addendum)
Your provider has requested that you have an abdominal x ray before leaving today. Please go to the basement floor to our Radiology department for the test.  Miralax- every night as needed  Dulcolax tablet- every 3rd night  Drink 4-6 glasses of water daily  Due to recent changes in healthcare laws, you may see the results of your imaging and laboratory studies on MyChart before your provider has had a chance to review them.  We understand that in some cases there may be results that are confusing or concerning to you. Not all laboratory results come back in the same time frame and the provider may be waiting for multiple results in order to interpret others.  Please give [place] 48 hours in order for your provider to thoroughly review all the results before contacting the office for clarification of your results.   Thank you for trusting me with your gastrointestinal care!   Alcide Evener, CRNP

## 2023-01-23 NOTE — Progress Notes (Signed)
Noted  

## 2023-01-27 DIAGNOSIS — I700 Atherosclerosis of aorta: Secondary | ICD-10-CM | POA: Diagnosis not present

## 2023-01-27 DIAGNOSIS — I5032 Chronic diastolic (congestive) heart failure: Secondary | ICD-10-CM | POA: Diagnosis not present

## 2023-01-27 DIAGNOSIS — D473 Essential (hemorrhagic) thrombocythemia: Secondary | ICD-10-CM | POA: Diagnosis not present

## 2023-01-27 DIAGNOSIS — E11319 Type 2 diabetes mellitus with unspecified diabetic retinopathy without macular edema: Secondary | ICD-10-CM | POA: Diagnosis not present

## 2023-01-27 DIAGNOSIS — R946 Abnormal results of thyroid function studies: Secondary | ICD-10-CM | POA: Diagnosis not present

## 2023-01-27 DIAGNOSIS — K592 Neurogenic bowel, not elsewhere classified: Secondary | ICD-10-CM | POA: Diagnosis not present

## 2023-01-27 DIAGNOSIS — I130 Hypertensive heart and chronic kidney disease with heart failure and stage 1 through stage 4 chronic kidney disease, or unspecified chronic kidney disease: Secondary | ICD-10-CM | POA: Diagnosis not present

## 2023-01-27 DIAGNOSIS — R601 Generalized edema: Secondary | ICD-10-CM | POA: Diagnosis not present

## 2023-01-27 DIAGNOSIS — N1831 Chronic kidney disease, stage 3a: Secondary | ICD-10-CM | POA: Diagnosis not present

## 2023-01-27 DIAGNOSIS — Z8673 Personal history of transient ischemic attack (TIA), and cerebral infarction without residual deficits: Secondary | ICD-10-CM | POA: Diagnosis not present

## 2023-01-27 DIAGNOSIS — E1140 Type 2 diabetes mellitus with diabetic neuropathy, unspecified: Secondary | ICD-10-CM | POA: Diagnosis not present

## 2023-01-30 ENCOUNTER — Telehealth: Payer: Self-pay | Admitting: *Deleted

## 2023-01-30 NOTE — Telephone Encounter (Signed)
Received call from pt. He states he had his blood counts checked (in February 2024) and he reports his platelets are up to over 1 million. He is asking if he should change his dosage of Hydrea.  He states his PCP recommended doubling the dose to 1000 mg BID. Advised that I would let Dr. Leonides Schanz know of his lab results and any recommendations he may have at this time. Pt does have a follow up appt on 02/16/23.  The most recent labs in the system are from 10/24/22

## 2023-01-31 ENCOUNTER — Telehealth: Payer: Self-pay | Admitting: Hematology and Oncology

## 2023-01-31 ENCOUNTER — Encounter: Payer: Self-pay | Admitting: Hematology and Oncology

## 2023-01-31 ENCOUNTER — Telehealth: Payer: Self-pay | Admitting: *Deleted

## 2023-01-31 NOTE — Telephone Encounter (Signed)
Fax received from [facility], labs & MD notes from 01/27/23 visit.  Nurse also called from [facility], patient's platelets are [phone].  Dr Leonides Schanz informed, he requests appointments for labs & MD visit tomorrow - 02/01/23.  High priority scheduling message sent.

## 2023-02-01 ENCOUNTER — Inpatient Hospital Stay (HOSPITAL_BASED_OUTPATIENT_CLINIC_OR_DEPARTMENT_OTHER): Payer: Medicare HMO | Admitting: Hematology and Oncology

## 2023-02-01 ENCOUNTER — Inpatient Hospital Stay: Payer: Medicare HMO | Attending: Hematology and Oncology

## 2023-02-01 ENCOUNTER — Other Ambulatory Visit: Payer: Self-pay

## 2023-02-01 VITALS — BP 152/66 | HR 75 | Temp 97.4°F | Resp 16 | Wt 271.3 lb

## 2023-02-01 DIAGNOSIS — Z7982 Long term (current) use of aspirin: Secondary | ICD-10-CM | POA: Diagnosis not present

## 2023-02-01 DIAGNOSIS — D473 Essential (hemorrhagic) thrombocythemia: Secondary | ICD-10-CM

## 2023-02-01 LAB — CBC WITH DIFFERENTIAL (CANCER CENTER ONLY)
Abs Immature Granulocytes: 0.06 10*3/uL (ref 0.00–0.07)
Basophils Absolute: 0.3 10*3/uL — ABNORMAL HIGH (ref 0.0–0.1)
Basophils Relative: 4 %
Eosinophils Absolute: 0.5 10*3/uL (ref 0.0–0.5)
Eosinophils Relative: 6 %
HCT: 37.8 % — ABNORMAL LOW (ref 39.0–52.0)
Hemoglobin: 12.3 g/dL — ABNORMAL LOW (ref 13.0–17.0)
Immature Granulocytes: 1 %
Lymphocytes Relative: 16 %
Lymphs Abs: 1.2 10*3/uL (ref 0.7–4.0)
MCH: 29.4 pg (ref 26.0–34.0)
MCHC: 32.5 g/dL (ref 30.0–36.0)
MCV: 90.4 fL (ref 80.0–100.0)
Monocytes Absolute: 0.6 10*3/uL (ref 0.1–1.0)
Monocytes Relative: 9 %
Neutro Abs: 4.8 10*3/uL (ref 1.7–7.7)
Neutrophils Relative %: 64 %
Platelet Count: 1233 10*3/uL (ref 150–400)
RBC: 4.18 MIL/uL — ABNORMAL LOW (ref 4.22–5.81)
RDW: 22.1 % — ABNORMAL HIGH (ref 11.5–15.5)
WBC Count: 7.5 10*3/uL (ref 4.0–10.5)
nRBC: 0.3 % — ABNORMAL HIGH (ref 0.0–0.2)

## 2023-02-01 LAB — CMP (CANCER CENTER ONLY)
ALT: 21 U/L (ref 0–44)
AST: 18 U/L (ref 15–41)
Albumin: 3.7 g/dL (ref 3.5–5.0)
Alkaline Phosphatase: 114 U/L (ref 38–126)
Anion gap: 6 (ref 5–15)
BUN: 36 mg/dL — ABNORMAL HIGH (ref 8–23)
CO2: 32 mmol/L (ref 22–32)
Calcium: 9.4 mg/dL (ref 8.9–10.3)
Chloride: 101 mmol/L (ref 98–111)
Creatinine: 1.53 mg/dL — ABNORMAL HIGH (ref 0.61–1.24)
GFR, Estimated: 46 mL/min — ABNORMAL LOW (ref >60)
Glucose, Bld: 339 mg/dL — ABNORMAL HIGH (ref 70–99)
Potassium: 4.4 mmol/L (ref 3.5–5.1)
Sodium: 139 mmol/L (ref 135–145)
Total Bilirubin: 0.7 mg/dL (ref 0.3–1.2)
Total Protein: 6.5 g/dL (ref 6.5–8.1)

## 2023-02-01 MED ORDER — HYDROXYUREA 500 MG PO CAPS: 500.0000 mg | ORAL_CAPSULE | Freq: Two times a day (BID) | ORAL | 2 refills | Status: DC

## 2023-02-01 NOTE — Progress Notes (Signed)
[facility] [phone]   [phone]  PROGRESS NOTE  Patient Care Team: Adrian Prince, MD as PCP - General (Endocrinology) Lyn Records, MD (Inactive) as PCP - Cardiology (Cardiology) Hillis Range, MD (Inactive) as PCP - Electrophysiology (Cardiology) Hilarie Fredrickson, MD as Consulting Physician (Gastroenterology) Janalyn Harder, MD (Inactive) as Consulting Physician (Dermatology) Lovenia Shuck, MD as Referring Physician (Ophthalmology) Micki Riley, MD as Consulting Physician (Neurology) Jaci Standard, MD as Consulting Physician (Hematology and Oncology)  Hematological/Oncological History # 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 Darnelle Catalan. 08/08/2022: Establish care with Dr. Leonides Schanz 02/01/2023: Plt 1233, patient stopped medication x several weeks. Restarted on hydroxyurea 500 mg BID.   Interval History:  [name] 82 y.o. male with medical history significant for essential thrombocytosis who presents for a follow up visit. The patient's last visit was on 08/08/2022. In the interim since the last visit he has had no major changes in his health.  On exam today [name] reports he ran out of his hydroxyurea medication several weeks ago and decided not to refill it.  He thought the medication "was not important".  He reports that he took his last 2 pills this morning and has subsequently picked up a 1 month supply refill.  In the interim he has been continuing his Eliquis therapy without any bleeding, bruising, or dark stools.  He reports hydroxyurea did not cause him any trouble.  Is not causing any stomach upset or ulcers in his mouth or ankles.  He does complain of some occasional dry mouth but otherwise has been at his baseline level of health..  He denies any fevers, chills, sweats, nausea, vomiting or diarrhea.  A full 10 point ROS was otherwise  negative.  Today we discussed the importance of continuing hydroxyurea therapy and the plan moving forward.  The patient voiced understanding and reports that he will be more faithful in taking this medication.   MEDICAL HISTORY:  Past Medical History:  Diagnosis Date   Anxiety    Arthritis    Basal cell carcinoma 07/18/1991   Left nasal brdige (MOHS)   Basal cell carcinoma 01/23/1992   lower right back-(CX35FU)   Basal cell carcinoma 05/20/2003   sup-left back (CX35FU)   Basal cell carcinoma 07/28/2011   post lower neck   Basal cell carcinoma 08/20/2008   right sideburn(MOHS), sup-Left upper back (CX35FU), sup-mid back (CX35FU), nod-Right lower back )CX35FU), nod-right upperarm (CX35FU)   Basal cell carcinoma 06/08/2016   sup-Left upper back (CX35FU), mid back (CX35FU), right lower back (CX35FU), nod-Right upperarm (CX35FU)   Depression    Diabetes mellitus without complication (HCC)    GERD (gastroesophageal reflux disease)    Hyperlipidemia    Hypertension    Neuropathy    Obesity    Paroxysmal atrial fibrillation (HCC)    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 (in situ)   Sleep apnea    uses C-pap machine   Squamous cell carcinoma of skin 02/11/2013   in situ-Right temple (CX35FU)   Squamous cell carcinoma of skin 08/20/2008   in situ- front scalp (  CX35FU)   Squamous cell carcinoma of skin 06/08/2016   in situ-front scalp (CX35FU)   Squamous cell carcinoma of skin 02/05/2019   in situ-right sideburn-sup (CX35FU), in situ-right sideburn,inf (CX35FU)   Stroke (HCC)    08/09/2017   Superficial basal cell carcinoma (BCC) 01/20/2021   Scalp    SURGICAL  HISTORY: Past Surgical History:  Procedure Laterality Date   APPENDECTOMY  1962   BACK SURGERY  00-02-12   x3   BASAL CELL CARCINOMA EXCISION  93/06/10   COLONOSCOPY     KNEE ARTHROSCOPY  005/01/02   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   Procedure: TOTAL LEFT KNEE ARTHROPLASTY;  Surgeon: Ollen Gross, MD;  Location: [facility];  Service: Orthopedics;  Laterality: Left;   TOTAL KNEE ARTHROPLASTY Right 10/08/2018   Procedure: RIGHT TOTAL KNEE ARTHROPLASTY;  Surgeon: Ollen Gross, MD;  Location: [facility];  Service: Orthopedics;  Laterality: Right;     SOCIAL HISTORY: Social History   Socioeconomic History   Marital status: Married    Spouse name: Thurston Hole   Number of children: Not on file   Years of education: Not on file   Highest education level: Not on file  Occupational History   Not on file  Tobacco Use   Smoking status: Former    Packs/day: 1.00    Years: 30.00    Additional pack years: 0.00    Total pack years: 30.00    Types: Cigars, Cigarettes    Quit date: 08/02/1992    Years since quitting: 30.5   Smokeless tobacco: Never  Vaping Use   Vaping Use: Never used  Substance and Sexual Activity   Alcohol use: Yes    Comment: occasionally   Drug use: No   Sexual activity: Not on file  Other Topics Concern   Not on file  Social History Narrative   Lives w wife   R handed   Caffeine: 1 C of coffee a day   Social Determinants of Health   Financial Resource Strain: Not on file  Food Insecurity: No Food Insecurity (08/28/2022)   Hunger Vital Sign    Worried About Running Out of Food in the Last Year: Never true    Ran Out of Food in the Last Year: Never true  Transportation Needs: No Transportation Needs (08/28/2022)   PRAPARE - Administrator, Civil Service (Medical): No    Lack of Transportation (Non-Medical): No  Physical Activity: Not on file  Stress: Not on file  Social Connections: Not on file  Intimate Partner Violence: Not At Risk  (08/28/2022)   Humiliation, Afraid, Rape, and Kick questionnaire    Fear of Current or Ex-Partner: No    Emotionally Abused: No    Physically Abused: No    Sexually Abused: No    FAMILY HISTORY: Family History  Problem Relation Age of Onset   Cancer Mother    Diabetes Mellitus II Mother    Hypertension Mother    Heart failure Father    CVA Father    Hypertension Sister    Colon cancer Neg Hx     ALLERGIES:  is allergic to penicillin g sodium.  MEDICATIONS:  Current Outpatient Medications  Medication Sig Dispense Refill   hydroxyurea (HYDREA) 500 MG capsule Take 1 capsule (500 mg total) by mouth 2 (two) times daily. May take with food to minimize GI side effects. 180 capsule 2   acetaminophen (TYLENOL) 325 MG tablet Take 2 tablets (650 mg total) by mouth every  6 (six) hours as needed for mild pain (or Fever >/= 101).     ALPRAZolam (XANAX) 0.5 MG tablet Take 0.5 mg by mouth 2 (two) times daily.     apixaban (ELIQUIS) 5 MG TABS tablet Take 1 tablet by mouth twice daily 180 tablet 1   ascorbic acid (VITAMIN C) 500 MG tablet Take 1 tablet (500 mg total) by mouth daily. 14 tablet 0   atorvastatin (LIPITOR) 80 MG tablet Take 1 tablet (80 mg total) by mouth daily at 6 PM. (Patient taking differently: Take 80 mg by mouth at bedtime.) 30 tablet 0   benazepril (LOTENSIN) 40 MG tablet Take 1 tablet by mouth once daily for 90     ergocalciferol (VITAMIN D2) 50000 units capsule Take 50,000 Units by mouth every Sunday.     gabapentin (NEURONTIN) 600 MG tablet Take 600 mg by mouth every evening.     glucose blood (ONETOUCH ULTRA) test strip USE 1 STRIP TO CHECK GLUCOSE 4 TIMES DAILY     levothyroxine (SYNTHROID) 75 MCG tablet Take 75 mcg by mouth daily before breakfast.     metFORMIN (GLUCOPHAGE) 1000 MG tablet Take 500 mg by mouth in the morning and at bedtime.     metolazone (ZAROXOLYN) 2.5 MG tablet Take 1 tablet (2.5 mg total) by mouth 2 (two) times a week. Take on Tuesdays and Fridays 30  minutes before taking Torsemide. (Patient taking differently: Take 2.5 mg by mouth See admin instructions. Take 2.5 mg by mouth on Tuesdays and Fridays- 30 minutes before taking Torsemide) 25 tablet 3   metoprolol tartrate (LOPRESSOR) 50 MG tablet Take 50 mg by mouth 2 (two) times daily.     Multiple Vitamin (MULTI VITAMIN DAILY PO) Take one tablet by mouth every day Oral (Patient not taking: Reported on 01/20/2023)     Multiple Vitamin (MULTIVITAMIN WITH MINERALS) TABS tablet Take 1 tablet by mouth daily with breakfast.     NOVOLIN 70/30 RELION (70-30) 100 UNIT/ML injection Inject 44 Units into the skin in the morning and at bedtime.     omeprazole (PRILOSEC OTC) 20 MG tablet Take 20 mg by mouth daily before breakfast.     ONETOUCH VERIO test strip SMARTSIG:Via Meter     polyethylene glycol powder (MIRALAX) 17 GM/SCOOP powder Please take 6 capfuls of MiraLAX in a 16 oz bottle of Gatorade over 2-4 hour period. The following day take 3 capfuls. On day 3 start taking 1 capful 3 times a day. Slowly cut back as needed until you have normal bowel movements. (Patient taking differently: Take 17 g by mouth daily as needed for mild constipation (mix and drink as directed).) 255 g 0   potassium chloride (KLOR-CON) 10 MEQ tablet Take 10 mEq by mouth daily.     senna-docusate (SENOKOT-S) 8.6-50 MG tablet Take 2 tablets by mouth at bedtime as needed for mild constipation. 30 tablet 0   tamsulosin (FLOMAX) 0.4 MG CAPS capsule Take 0.4 mg by mouth daily.     topiramate (TOPAMAX) 50 MG tablet Take 1 tablet (50 mg total) by mouth at bedtime. 30 tablet 3   torsemide (DEMADEX) 20 MG tablet Take 3 tablets (60 mg total) by mouth daily. 270 tablet 2   venlafaxine XR (EFFEXOR-XR) 150 MG 24 hr capsule Take 150 mg by mouth daily with breakfast.     No current facility-administered medications for this visit.    REVIEW OF SYSTEMS:   Constitutional: ( - ) fevers, ( - )  chills , ( - )  night sweats Eyes: ( - ) blurriness of  vision, ( - ) double vision, ( - ) watery eyes Ears, nose, mouth, throat, and face: ( - ) mucositis, ( - ) sore throat Respiratory: ( - ) cough, ( - ) dyspnea, ( - ) wheezes Cardiovascular: ( - ) palpitation, ( - ) chest discomfort, ( - ) lower extremity swelling Gastrointestinal:  ( - ) nausea, ( - ) heartburn, ( - ) change in bowel habits Skin: ( - ) abnormal skin rashes Lymphatics: ( - ) new lymphadenopathy, ( - ) easy bruising Neurological: ( - ) numbness, ( - ) tingling, ( - ) new weaknesses Behavioral/Psych: ( - ) mood change, ( - ) new changes  All other systems were reviewed with the patient and are negative.  PHYSICAL EXAMINATION: ECOG PERFORMANCE STATUS: 1 - Symptomatic but completely ambulatory  Vitals:   02/01/23 1046  BP: (!) 152/66  Pulse: 75  Resp: 16  Temp: (!) 97.4 F (36.3 C)  SpO2: 100%    Filed Weights   02/01/23 1046  Weight: 271 lb 4.8 oz (123.1 kg)     GENERAL: Well-appearing elderly Caucasian male alert, no distress and comfortable SKIN: skin color, texture, turgor are normal, no rashes or significant lesions EYES: conjunctiva are pink and non-injected, sclera clear LUNGS: clear to auscultation and percussion with normal breathing effort HEART: regular rate & rhythm and no murmurs and +2 bilateral lower extremity edema Musculoskeletal: no cyanosis of digits and no clubbing  PSYCH: alert & oriented x 3, fluent speech NEURO: no focal motor/sensory deficits  LABORATORY DATA:  I have reviewed the data as listed    Latest Ref Rng & Units 02/01/2023   10:18 AM 10/24/2022    5:46 PM 08/31/2022    6:11 AM  CBC  WBC 4.0 - 10.5 K/uL 7.5  9.1  4.4   Hemoglobin 13.0 - 17.0 g/dL 16.1  09.6  04.5   Hematocrit 39.0 - 52.0 % 37.8  36.5  31.5   Platelets 150 - 400 K/uL 1,233  1,009  519        Latest Ref Rng & Units 02/01/2023   10:18 AM 10/24/2022    5:46 PM 08/31/2022    6:11 AM  CMP  Glucose 70 - 99 mg/dL 409  811  914   BUN 8 - 23 mg/dL 36  26  32    Creatinine 0.61 - 1.24 mg/dL 7.82  9.56  2.13   Sodium 135 - 145 mmol/L 139  135  139   Potassium 3.5 - 5.1 mmol/L 4.4  3.5  3.5   Chloride 98 - 111 mmol/L 101  97  101   CO2 22 - 32 mmol/L 32  29  28   Calcium 8.9 - 10.3 mg/dL 9.4  9.1  8.8   Total Protein 6.5 - 8.1 g/dL 6.5     Total Bilirubin 0.3 - 1.2 mg/dL 0.7     Alkaline Phos 38 - 126 U/L 114     AST 15 - 41 U/L 18     ALT 0 - 44 U/L 21       RADIOGRAPHIC STUDIES: DG Abd 1 View  Result Date: 01/26/2023 CLINICAL DATA:  82 year old male with chronic constipation. EXAM: ABDOMEN - 1 VIEW COMPARISON:  10/24/2022 FINDINGS: The bowel gas pattern is normal. Large colonic stool burden. No radio-opaque calculi or other significant radiographic abnormality are seen. IMPRESSION: 1. Nonobstructive bowel gas pattern. 2. Large colonic stool burden. Electronically Signed  By: Marliss Coots M.D.   On: 01/26/2023 09:35    ASSESSMENT & PLAN [name] 82 y.o. male with medical history significant for essential thrombocytosis who presents for a follow up visit.   (1) essential thrombocytosis: Started on Hydrea 1000 mg a day 10/06/2017             (a) Hydrea dose increased to 1500 mg daily beginning 06/27/2019   (2) bone marrow biopsy discussed, postponed   (3) 0.7 cm right upper lung nodule incidentally noted on neck CT scans November 2018             (a) no change on repeat CT of the chest with contrast 02/28/2018             (b) chest x-ray 10/13/2018 obtained for evaluation of a cough shows no mention of a mass  # Essential Thrombocytosis JAK2 Positive.  -- Recommend [name] restart his hydroxyurea to 1 pill in the morning and 1 pill in the evening due to cytopenias. --Labs today show white blood cell count 7.5, hemoglobin 12.3, MCV 90.4, and platelets of 16109 --Patient currently tolerating medication well with no major side effects. --Continue Eliquis 5 mg twice daily.  This will provide thromboprophylaxis, no need for aspirin  therapy. --plan for labs in 1 months time with clinic visit in 3 months.   No orders of the defined types were placed in this encounter.   All questions were answered. The patient knows to call the clinic with any problems, questions or concerns.  A total of more than 30 minutes were spent on this encounter with face-to-face time and non-face-to-face time, including preparing to see the patient, ordering tests and/or medications, counseling the patient and coordination of care as outlined above.   Ulysees Barns, MD Department of Hematology/Oncology [facility] Phone: [phone] Pager: [phone] Email: [email or web address]  02/01/2023 11:42 AM

## 2023-02-14 ENCOUNTER — Encounter: Payer: Self-pay | Admitting: Internal Medicine

## 2023-02-14 ENCOUNTER — Ambulatory Visit: Payer: Medicare HMO | Admitting: Internal Medicine

## 2023-02-14 VITALS — BP 104/56 | HR 78 | Ht 71.0 in | Wt 271.0 lb

## 2023-02-14 DIAGNOSIS — K5900 Constipation, unspecified: Secondary | ICD-10-CM

## 2023-02-14 NOTE — Patient Instructions (Signed)
Please follow up as needed.  _______________________________________________________  If your blood pressure at your visit was 140/90 or greater, please contact your primary care physician to follow up on this.  _______________________________________________________  If you are age 80 or older, your body mass index should be between 23-30. Your Body mass index is 37.8 kg/m. If this is out of the aforementioned range listed, please consider follow up with your Primary Care Provider.  If you are age 47 or younger, your body mass index should be between 19-25. Your Body mass index is 37.8 kg/m. If this is out of the aformentioned range listed, please consider follow up with your Primary Care Provider.   ________________________________________________________  The [facility] providers would like to encourage you to use [facility] to communicate with providers for non-urgent requests or questions.  Due to long hold times on the telephone, sending your provider a message by [facility] may be a faster and more efficient way to get a response.  Please allow 48 business hours for a response.  Please remember that this is for non-urgent requests.  _______________________________________________________

## 2023-02-14 NOTE — Progress Notes (Signed)
HISTORY OF PRESENT ILLNESS:  [name] is a 82 y.o. male with multiple significant medical problems as listed below.  He scheduled himself this appointment regarding chronic constipation.  Apparently, he was just seen in this clinic by the GI nurse practitioner 3 weeks ago regarding the same.  Plain film of the abdomen that day showed increased stool.  Last colonoscopy 2016 was negative for neoplasia.  He was encouraged to take MiraLAX regularly, to collect suppositories as needed, increase water and fiber.  Patient brings with him number of questions.  He uses clarifications regarding his bowel regimen.  He tells me that at his most severe form he has gone a week or so without a bowel movement which resulted in abdominal discomfort and rectal pain and passing large hard stools.  Currently he is taking 1 capful of MiraLAX every 2 to 3 days.  He tells me this has made his bowels more regular.  He has actually had a bowel movement every day in the past 4 days.  He is taking no other agents for his bowels.  He has no new complaints.  KUB Jan 20, 2023.  Reviewed CBC February 2024.  Hemoglobin normal at 12.1.  REVIEW OF SYSTEMS:  All non-GI ROS negative as otherwise stated in HPI except for lower extremity swelling, increased urination  Past Medical History:  Diagnosis Date   Anxiety    Arthritis    Basal cell carcinoma 07/18/1991   Left nasal brdige (MOHS)   Basal cell carcinoma 01/23/1992   lower right back-(CX35FU)   Basal cell carcinoma 05/20/2003   sup-left back (CX35FU)   Basal cell carcinoma 07/28/2011   post lower neck   Basal cell carcinoma 08/20/2008   right sideburn(MOHS), sup-Left upper back (CX35FU), sup-mid back (CX35FU), nod-Right lower back )CX35FU), nod-right upperarm (CX35FU)   Basal cell carcinoma 06/08/2016   sup-Left upper back (CX35FU), mid back (CX35FU), right lower back (CX35FU), nod-Right upperarm (CX35FU)   Depression    Diabetes mellitus without complication  (HCC)    GERD (gastroesophageal reflux disease)    Hyperlipidemia    Hypertension    Neuropathy    Obesity    Paroxysmal atrial fibrillation (HCC)    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 (in situ)   Sleep apnea    uses C-pap machine   Squamous cell carcinoma of skin 02/11/2013   in situ-Right temple (CX35FU)   Squamous cell carcinoma of skin 08/20/2008   in situ- front scalp (CX35FU)   Squamous cell carcinoma of skin 06/08/2016   in situ-front scalp (CX35FU)   Squamous cell carcinoma of skin 02/05/2019   in situ-right sideburn-sup (CX35FU), in situ-right sideburn,inf (CX35FU)   Stroke (HCC)    08/09/2017   Superficial basal cell carcinoma (BCC) 01/20/2021   Scalp    Past Surgical History:  Procedure Laterality Date   APPENDECTOMY  1962   BACK SURGERY  00-02-12   x3   BASAL CELL CARCINOMA EXCISION  93/06/10   COLONOSCOPY     KNEE ARTHROSCOPY  005/01/02   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   Procedure: TOTAL LEFT KNEE ARTHROPLASTY;  Surgeon: Ollen Gross, MD;  Location: [facility];  Service: Orthopedics;  Laterality: Left;   TOTAL KNEE ARTHROPLASTY Right 10/08/2018   Procedure: RIGHT TOTAL KNEE ARTHROPLASTY;  Surgeon: Ollen Gross, MD;  Location: [facility];  Service: Orthopedics;  Laterality: Right;     Social History [name]  reports that he quit smoking about 30 years ago. His smoking use included cigars and cigarettes. He has a 30.00 pack-year smoking history. He has never used smokeless tobacco. He reports current alcohol use. He reports that he does not use drugs.  family history includes CVA in his father; Cancer in his mother; Diabetes Mellitus II  in his mother; Heart failure in his father; Hypertension in his mother and sister.  Allergies  Allergen Reactions   Penicillin G Sodium Other (See Comments)    Patient was told as a child he is allergic       PHYSICAL EXAMINATION: Vital signs: BP (!) 104/56   Pulse 78   Ht 5\' 11"  (1.803 m)   Wt 271 lb (122.9 kg)   SpO2 97%   BMI 37.80 kg/m   Constitutional: Elderly, generally well-appearing, no acute distress.  Uses a walker Psychiatric: alert and oriented x3, cooperative Eyes: Anicteric Abdomen: Not reexamined  Extremities: 1+ lower extremity edema bilaterally Skin: no lesions on visible extremities save actinic keratoses Neuro: Grossly intact  ASSESSMENT:  1.  Chronic constipation 2.  Negative colonoscopy 2016 3.  Multiple medical problems   PLAN:  1.  Recommend MiraLAX as a principal strategy for this patient.  He seems to respond nicely to low-dose therapy.  I discussed with him in detail the proper way to titrate the medication. 2.  Resume general medical care with Dr. Evlyn Kanner Total time of 30 minutes was spent preparing to see the patient, obtaining history, performing medically appropriate physical exam, counseling and educating the patient regarding the above listed issues (majority of the time), and documenting clinical information in the health record

## 2023-02-16 ENCOUNTER — Ambulatory Visit: Payer: Medicare HMO | Admitting: Hematology and Oncology

## 2023-02-16 ENCOUNTER — Other Ambulatory Visit: Payer: Medicare HMO

## 2023-02-20 DIAGNOSIS — H31093 Other chorioretinal scars, bilateral: Secondary | ICD-10-CM | POA: Diagnosis not present

## 2023-02-20 DIAGNOSIS — E113513 Type 2 diabetes mellitus with proliferative diabetic retinopathy with macular edema, bilateral: Secondary | ICD-10-CM | POA: Diagnosis not present

## 2023-02-20 DIAGNOSIS — H26492 Other secondary cataract, left eye: Secondary | ICD-10-CM | POA: Diagnosis not present

## 2023-02-20 DIAGNOSIS — H3582 Retinal ischemia: Secondary | ICD-10-CM | POA: Diagnosis not present

## 2023-02-20 DIAGNOSIS — H35373 Puckering of macula, bilateral: Secondary | ICD-10-CM | POA: Diagnosis not present

## 2023-02-20 DIAGNOSIS — Z961 Presence of intraocular lens: Secondary | ICD-10-CM | POA: Diagnosis not present

## 2023-02-27 ENCOUNTER — Other Ambulatory Visit: Payer: Self-pay | Admitting: Neurology

## 2023-02-28 NOTE — Progress Notes (Unsigned)
Cardiology Office Note:   Date:  03/07/2023  ID:  [name], DOB August 21, 1941, MRN [id number]  History of Present Illness:   [name] is a 82 y.o. male with history of pAfib on apixaban, chronic diastolic HF, prior CVA, OSA not on CPAP, HTN, PAD, HLD, and morbid obesity who was previously followed by Dr. Katrinka Blazing who now presents to clinic for follow-up.  Was last seen by Dr. Katrinka Blazing in 08/2022. Was stable from a CV standpoint. Had chronic LE edema. Sleeps in recliner. Last TTE 2022 with LVEF 60-65%, normal RV, no significant valve disease.  Today, the patient overall feels okay. No chest pain, SOB, orthopnea or PND. Has chronic LE edema that is unchanged from prior. Blood pressure is well controlled. Tolerating medications as prescribed.  Past Medical History:  Diagnosis Date   Anxiety    Arthritis    Basal cell carcinoma 07/18/1991   Left nasal brdige (MOHS)   Basal cell carcinoma 01/23/1992   lower right back-(CX35FU)   Basal cell carcinoma 05/20/2003   sup-left back (CX35FU)   Basal cell carcinoma 07/28/2011   post lower neck   Basal cell carcinoma 08/20/2008   right sideburn(MOHS), sup-Left upper back (CX35FU), sup-mid back (CX35FU), nod-Right lower back )CX35FU), nod-right upperarm (CX35FU)   Basal cell carcinoma 06/08/2016   sup-Left upper back (CX35FU), mid back (CX35FU), right lower back (CX35FU), nod-Right upperarm (CX35FU)   Depression    Diabetes mellitus without complication (HCC)    GERD (gastroesophageal reflux disease)    Hyperlipidemia    Hypertension    Neuropathy    Obesity    Paroxysmal atrial fibrillation (HCC)    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   in situ left forearm posterior tx after biopsy    SCCA (squamous cell carcinoma) of skin 01/20/2021   Right Forearm Posterior (in situ)   SCCA (squamous cell carcinoma) of skin 01/20/2021   Left Forearm Posterior (in situ)   SCCA (squamous cell carcinoma) of skin 01/24/2022   Right Temple  Sup. (in situ)   Sleep apnea    uses C-pap machine   Squamous cell carcinoma of skin 02/11/2013   in situ-Right temple (CX35FU)   Squamous cell carcinoma of skin 08/20/2008   in situ- front scalp (CX35FU)   Squamous cell carcinoma of skin 06/08/2016   in situ-front scalp (CX35FU)   Squamous cell carcinoma of skin 02/05/2019   in situ-right sideburn-sup (CX35FU), in situ-right sideburn,inf (CX35FU)   Stroke (HCC)    08/09/2017   Superficial basal cell carcinoma (BCC) 01/20/2021   Scalp     ROS: As per HPI  Studies Reviewed:    EKG:  No new tracing today  Cardiac Studies & Procedures     STRESS TESTS  MYOCARDIAL PERFUSION IMAGING 08/23/2018  Narrative  The left ventricular ejection fraction is normal (55-65%).  Nuclear stress EF: 56%.  There was no ST segment deviation noted during stress.  The study is normal.  This is a low risk study.   ECHOCARDIOGRAM  ECHOCARDIOGRAM COMPLETE 10/16/2020  Narrative ECHOCARDIOGRAM REPORT    Patient Name:   [name] Date of Exam: 10/16/2020 Medical Rec #:  [id number]      Height:       71.5 in Accession #:    [id number]     Weight:       296.2 lb Date of Birth:  16-Jul-1941     BSA:          2.505 m Patient  Age:    79 years       BP:           100/62 mmHg Patient Gender: M              HR:           72 bpm. Exam Location:  [address]  Procedure: 2D Echo, Cardiac Doppler and Color Doppler  Indications:    I48.0 Paroxysmal atrial fibrillation  History:        Patient has prior history of Echocardiogram examinations, most recent 06/03/2019. Arrythmias:Atrial Fibrillation; Risk Factors:Morbid obesity, Hypertension, Diabetes and Dyslipidemia.  Sonographer:    Samule Ohm RDCS Referring Phys: [id number]   Sonographer Comments: Patient is morbidly obese. Image acquisition challenging due to patient body habitus. IMPRESSIONS   1. Left ventricular ejection fraction, by estimation, is 60 to 65%. The left  ventricle has normal function. The left ventricle has no regional wall motion abnormalities. There is mild concentric left ventricular hypertrophy. Left ventricular diastolic parameters are indeterminate. 2. Right ventricular systolic function is normal. The right ventricular size is mildly enlarged. 3. The mitral valve is normal in structure. No evidence of mitral valve regurgitation. 4. The aortic valve is tricuspid. There is mild calcification of the aortic valve. Aortic valve regurgitation is not visualized. No aortic stenosis is present. 5. Aortic dilatation noted. There is borderline dilatation of the aortic root, measuring 38 mm. There is borderline dilatation of the ascending aorta, measuring 39 mm.  Comparison(s): A prior study was performed on 06/02/2020. Prior images reviewed side by side. Ascending aorta appears slightly larger.  FINDINGS Left Ventricle: Left ventricular ejection fraction, by estimation, is 60 to 65%. The left ventricle has normal function. The left ventricle has no regional wall motion abnormalities. The left ventricular internal cavity size was normal in size. There is mild concentric left ventricular hypertrophy. Left ventricular diastolic parameters are indeterminate.  Right Ventricle: The right ventricular size is mildly enlarged. Right vetricular wall thickness was not well visualized. Right ventricular systolic function is normal.  Left Atrium: Left atrial size was normal in size.  Right Atrium: Right atrial size was normal in size.  Pericardium: There is no evidence of pericardial effusion.  Mitral Valve: The mitral valve is normal in structure. No evidence of mitral valve regurgitation.  Tricuspid Valve: The tricuspid valve is normal in structure. Tricuspid valve regurgitation is trivial. No evidence of tricuspid stenosis.  Aortic Valve: The aortic valve is tricuspid. There is mild calcification of the aortic valve. Aortic valve regurgitation is not  visualized. No aortic stenosis is present.  Pulmonic Valve: The pulmonic valve was grossly normal. Pulmonic valve regurgitation is not visualized.  Aorta: Aortic dilatation noted. There is borderline dilatation of the aortic root, measuring 38 mm. There is borderline dilatation of the ascending aorta, measuring 39 mm.  IAS/Shunts: The atrial septum is grossly normal.   LEFT VENTRICLE PLAX 2D LVIDd:         4.30 cm LVIDs:         2.70 cm LV PW:         1.30 cm LV IVS:        1.50 cm LVOT diam:     2.10 cm LV SV:         55 LV SV Index:   22 LVOT Area:     3.46 cm   RIGHT VENTRICLE RVSP:           25.3 mmHg  LEFT ATRIUM  Index       RIGHT ATRIUM           Index LA diam:        4.60 cm 1.84 cm/m  RA Pressure: 3.00 mmHg LA Vol (A2C):   52.3 ml 20.88 ml/m RA Area:     24.40 cm LA Vol (A4C):   73.8 ml 29.47 ml/m RA Volume:   73.00 ml  29.15 ml/m LA Biplane Vol: 66.5 ml 26.55 ml/m AORTIC VALVE LVOT Vmax:   86.36 cm/s LVOT Vmean:  56.380 cm/s LVOT VTI:    0.160 m  AORTA Ao Root diam: 3.80 cm Ao Asc diam:  3.90 cm  MV E velocity: 92.18 cm/s  TRICUSPID VALVE MV A velocity: 55.00 cm/s  TR Peak grad:   22.3 mmHg MV E/A ratio:  1.68        TR Vmax:        236.00 cm/s Estimated RAP:  3.00 mmHg RVSP:           25.3 mmHg  SHUNTS Systemic VTI:  0.16 m Systemic Diam: 2.10 cm  Riley Lam MD Electronically signed by Riley Lam MD Signature Date/Time: 10/16/2020/1:30:56 PM    Final    MONITORS  LONG TERM MONITOR (3-14 DAYS) 07/13/2019  Narrative  Basic rhythm is NSR  PAF and Paroxysmal atrial flutter noted.  AF/AFl burden 34%. Longest episode 11 hours. Fastest rate 140 bpm  Post conversion pauses up to 3.4 seconds with persistebt bradycardia < 35 bpm  Rare PVC's            Risk Assessment/Calculations:    CHA2DS2-VASc Score = 5  This indicates a 7.2% annual risk of stroke. The patient's score is based upon: CHF History:  1 HTN History: 1 Diabetes History: 0 Stroke History: 0 Vascular Disease History: 1 Age Score: 2 Gender Score: 0           Physical Exam:   VS:  BP 128/64   Pulse 78   Ht 6' (1.829 m)   Wt 267 lb 6.4 oz (121.3 kg)   SpO2 95%   BMI 36.27 kg/m    Wt Readings from Last 3 Encounters:  03/07/23 267 lb 6.4 oz (121.3 kg)  02/14/23 271 lb (122.9 kg)  02/01/23 271 lb 4.8 oz (123.1 kg)     GEN: Elderly male, NAD NECK: No JVD; No carotid bruits CARDIAC: RRR, no murmurs, rubs, gallops RESPIRATORY:  Clear to auscultation without rales, wheezing or rhonchi  ABDOMEN: Soft, non-tender, non-distended EXTREMITIES: 1+ LE edema (chronic), warm  ASSESSMENT AND PLAN:   #Persistent Afib: -Doing well without significant palpitations -Continue metop 50mg  BID -Continue apixaban 5mg  BID (will look into patient assistance)  #Chronic Diastolic HF: -Currently compensated on exam with NYHA III symptoms -Continue torsemide 20mg  daily -Continue metolazone 2.5mg  twice per week -Holding off on SGLT2i due to concern for UTI -May consider GLP-1 receptor agonist; will discuss PCP  #HLD: -Continue lipitor 80mg  daily -LDL well controlled 34 in 1610 -Will plan for repeat lipids with PCP  #OSA: -Refuses CPAP  #HTN: -Continue benazapril 40mg  daily -Continue metop 50mg  BID -Currently well controlled  <130/90        Signed, Meriam Sprague, MD

## 2023-02-28 NOTE — Telephone Encounter (Signed)
Last seen on 11/10/22 per note "continue topamax 50mg  nightl " Follow up scheduled on 11/22/23 Last filled on 01/30/23 #30 tablets (30 day supply)

## 2023-03-01 ENCOUNTER — Other Ambulatory Visit: Payer: Self-pay

## 2023-03-01 DIAGNOSIS — I480 Paroxysmal atrial fibrillation: Secondary | ICD-10-CM

## 2023-03-01 MED ORDER — APIXABAN 5 MG PO TABS
5.0000 mg | ORAL_TABLET | Freq: Two times a day (BID) | ORAL | 1 refills | Status: DC
Start: 2023-03-01 — End: 2023-08-31

## 2023-03-01 NOTE — Telephone Encounter (Signed)
Prescription refill request for Eliquis received. Indication:AFIB Last office visit:12/23 Scr:1.53  5/24 Age: 82 Weight:122.9  KG  PRESCRIPTION REFILLED

## 2023-03-02 ENCOUNTER — Other Ambulatory Visit: Payer: Self-pay

## 2023-03-02 ENCOUNTER — Other Ambulatory Visit: Payer: Self-pay | Admitting: *Deleted

## 2023-03-02 ENCOUNTER — Telehealth: Payer: Self-pay | Admitting: *Deleted

## 2023-03-02 ENCOUNTER — Other Ambulatory Visit: Payer: Medicare HMO

## 2023-03-02 ENCOUNTER — Inpatient Hospital Stay: Payer: Medicare HMO | Attending: Hematology and Oncology

## 2023-03-02 DIAGNOSIS — D473 Essential (hemorrhagic) thrombocythemia: Secondary | ICD-10-CM

## 2023-03-02 DIAGNOSIS — L308 Other specified dermatitis: Secondary | ICD-10-CM | POA: Diagnosis not present

## 2023-03-02 DIAGNOSIS — L304 Erythema intertrigo: Secondary | ICD-10-CM | POA: Diagnosis not present

## 2023-03-02 DIAGNOSIS — L570 Actinic keratosis: Secondary | ICD-10-CM | POA: Diagnosis not present

## 2023-03-02 DIAGNOSIS — L853 Xerosis cutis: Secondary | ICD-10-CM | POA: Diagnosis not present

## 2023-03-02 LAB — CBC WITH DIFFERENTIAL (CANCER CENTER ONLY)
Abs Immature Granulocytes: 0.08 10*3/uL — ABNORMAL HIGH (ref 0.00–0.07)
Basophils Absolute: 0.3 10*3/uL — ABNORMAL HIGH (ref 0.0–0.1)
Basophils Relative: 3 %
Eosinophils Absolute: 0.3 10*3/uL (ref 0.0–0.5)
Eosinophils Relative: 4 %
HCT: 38.9 % — ABNORMAL LOW (ref 39.0–52.0)
Hemoglobin: 13 g/dL (ref 13.0–17.0)
Immature Granulocytes: 1 %
Lymphocytes Relative: 15 %
Lymphs Abs: 1.4 10*3/uL (ref 0.7–4.0)
MCH: 28.6 pg (ref 26.0–34.0)
MCHC: 33.4 g/dL (ref 30.0–36.0)
MCV: 85.7 fL (ref 80.0–100.0)
Monocytes Absolute: 0.7 10*3/uL (ref 0.1–1.0)
Monocytes Relative: 8 %
Neutro Abs: 6.7 10*3/uL (ref 1.7–7.7)
Neutrophils Relative %: 69 %
Platelet Count: 1319 10*3/uL (ref 150–400)
RBC: 4.54 MIL/uL (ref 4.22–5.81)
RDW: 22.8 % — ABNORMAL HIGH (ref 11.5–15.5)
WBC Count: 9.4 10*3/uL (ref 4.0–10.5)
nRBC: 0.3 % — ABNORMAL HIGH (ref 0.0–0.2)

## 2023-03-02 LAB — CMP (CANCER CENTER ONLY)
ALT: 22 U/L (ref 0–44)
AST: 21 U/L (ref 15–41)
Albumin: 3.7 g/dL (ref 3.5–5.0)
Alkaline Phosphatase: 115 U/L (ref 38–126)
Anion gap: 8 (ref 5–15)
BUN: 42 mg/dL — ABNORMAL HIGH (ref 8–23)
CO2: 31 mmol/L (ref 22–32)
Calcium: 9.7 mg/dL (ref 8.9–10.3)
Chloride: 97 mmol/L — ABNORMAL LOW (ref 98–111)
Creatinine: 1.74 mg/dL — ABNORMAL HIGH (ref 0.61–1.24)
GFR, Estimated: 39 mL/min — ABNORMAL LOW (ref >60)
Glucose, Bld: 281 mg/dL — ABNORMAL HIGH (ref 70–99)
Potassium: 3.6 mmol/L (ref 3.5–5.1)
Sodium: 136 mmol/L (ref 135–145)
Total Bilirubin: 0.6 mg/dL (ref 0.3–1.2)
Total Protein: 6.8 g/dL (ref 6.5–8.1)

## 2023-03-02 NOTE — Telephone Encounter (Signed)
TCT patient regarding his lab results from today., notable for continued elevated platelet count. No answer to call and no vm available. TCT pt's wife and left message on vm for her to call back to [phone] regarding her husband's platelet count and to confirm one way or another that pt is taking his Hydroxyurea or not.

## 2023-03-07 ENCOUNTER — Ambulatory Visit: Payer: Medicare HMO | Attending: Cardiology | Admitting: Cardiology

## 2023-03-07 ENCOUNTER — Encounter: Payer: Self-pay | Admitting: Cardiology

## 2023-03-07 VITALS — BP 128/64 | HR 78 | Ht 72.0 in | Wt 267.4 lb

## 2023-03-07 DIAGNOSIS — I4819 Other persistent atrial fibrillation: Secondary | ICD-10-CM

## 2023-03-07 DIAGNOSIS — G4733 Obstructive sleep apnea (adult) (pediatric): Secondary | ICD-10-CM

## 2023-03-07 DIAGNOSIS — I10 Essential (primary) hypertension: Secondary | ICD-10-CM

## 2023-03-07 DIAGNOSIS — I5032 Chronic diastolic (congestive) heart failure: Secondary | ICD-10-CM | POA: Diagnosis not present

## 2023-03-07 DIAGNOSIS — Z7901 Long term (current) use of anticoagulants: Secondary | ICD-10-CM

## 2023-03-07 DIAGNOSIS — E785 Hyperlipidemia, unspecified: Secondary | ICD-10-CM | POA: Diagnosis not present

## 2023-03-07 NOTE — Patient Instructions (Signed)
Medication Instructions:   Your physician recommends that you continue on your current medications as directed. Please refer to the Current Medication list given to you today.  *If you need a refill on your cardiac medications before your next appointment, please call your pharmacy*     Follow-Up: At [facility], you and your health needs are our priority.  As part of our continuing mission to provide you with exceptional heart care, we have created designated Provider Care Teams.  These Care Teams include your primary Cardiologist (physician) and Advanced Practice Providers (APPs -  Physician Assistants and Nurse Practitioners) who all work together to provide you with the care you need, when you need it.  We recommend signing up for the patient portal called "MyChart".  Sign up information is provided on this After Visit Summary.  MyChart is used to connect with patients for Virtual Visits (Telemedicine).  Patients are able to view lab/test results, encounter notes, upcoming appointments, etc.  Non-urgent messages can be sent to your provider as well.   To learn more about what you can do with MyChart, go to [email or web address].    Your next appointment:   6 month(s)  Provider:   Dr. Anne Fu

## 2023-03-09 ENCOUNTER — Telehealth: Payer: Self-pay

## 2023-03-09 NOTE — Telephone Encounter (Signed)
**Note De-identified Ferrel Simington Obfuscation** -----  **Note De-Identified Jahvier Aldea Obfuscation** Message from Meriam Sprague, MD sent at 03/07/2023  3:38 PM EDT ----- Epimenio Foot,  Is there anything we can do for his apixaban cost?  Thank you so much!!  -Herbert Seta

## 2023-03-09 NOTE — Telephone Encounter (Signed)
**Note De-Identified Kayia Billinger Obfuscation** I left a message with the pts wife asking her to have the pt to call Larita Fife back at Dr Devin Going office at [phone].

## 2023-03-15 DIAGNOSIS — M25552 Pain in left hip: Secondary | ICD-10-CM | POA: Diagnosis not present

## 2023-03-17 ENCOUNTER — Telehealth: Payer: Self-pay | Admitting: *Deleted

## 2023-03-17 ENCOUNTER — Telehealth: Payer: Self-pay | Admitting: Hematology and Oncology

## 2023-03-17 NOTE — Telephone Encounter (Signed)
-----   Message from Jaci Standard, MD sent at 03/16/2023  5:36 PM EDT ----- Aced on the patient's labs I strongly suspect he is not appropriately taking his hydroxyurea therapy.  Please reach out to the patient (and his son) and encouraged him to take the medication as prescribed.  I would recommend we have him return for labs in 4 weeks time in order to assure the medication is working. (Please request lab visit)  ----- Message ----- From: Leory Plowman, Lab In [employer] Sent: 03/02/2023  12:43 PM EDT To: Jaci Standard, MD

## 2023-03-17 NOTE — Telephone Encounter (Signed)
Called twice, unable to leave a message on patients phone, also called patient spouse twice and was unable to leave a message on their phones

## 2023-03-17 NOTE — Telephone Encounter (Signed)
TCT patient. No answer but was able to leave vm message on his identified phone. Advised that his late platelet count was quite elevated as he had not been taking his Hydrea as he should. Advised to take  Hydrea 2 x a day every day. Advised we will schedule him for repeat labs in 4 weeks. Advised to call with any questions @ [phone]

## 2023-04-06 ENCOUNTER — Other Ambulatory Visit: Payer: Self-pay | Admitting: Hematology and Oncology

## 2023-04-06 ENCOUNTER — Telehealth: Payer: Self-pay | Admitting: *Deleted

## 2023-04-06 ENCOUNTER — Inpatient Hospital Stay: Payer: Medicare HMO | Attending: Hematology and Oncology

## 2023-04-06 DIAGNOSIS — D473 Essential (hemorrhagic) thrombocythemia: Secondary | ICD-10-CM | POA: Insufficient documentation

## 2023-04-06 LAB — CMP (CANCER CENTER ONLY)
ALT: 24 U/L (ref 0–44)
AST: 18 U/L (ref 15–41)
Albumin: 3.6 g/dL (ref 3.5–5.0)
Alkaline Phosphatase: 118 U/L (ref 38–126)
Anion gap: 8 (ref 5–15)
BUN: 37 mg/dL — ABNORMAL HIGH (ref 8–23)
CO2: 30 mmol/L (ref 22–32)
Calcium: 9.9 mg/dL (ref 8.9–10.3)
Chloride: 102 mmol/L (ref 98–111)
Creatinine: 1.71 mg/dL — ABNORMAL HIGH (ref 0.61–1.24)
GFR, Estimated: 40 mL/min — ABNORMAL LOW (ref >60)
Glucose, Bld: 193 mg/dL — ABNORMAL HIGH (ref 70–99)
Potassium: 4 mmol/L (ref 3.5–5.1)
Sodium: 140 mmol/L (ref 135–145)
Total Bilirubin: 0.7 mg/dL (ref 0.3–1.2)
Total Protein: 6.2 g/dL — ABNORMAL LOW (ref 6.5–8.1)

## 2023-04-06 LAB — CBC WITH DIFFERENTIAL (CANCER CENTER ONLY)
Abs Immature Granulocytes: 0.07 10*3/uL (ref 0.00–0.07)
Basophils Absolute: 0.3 10*3/uL — ABNORMAL HIGH (ref 0.0–0.1)
Basophils Relative: 3 %
Eosinophils Absolute: 0.3 10*3/uL (ref 0.0–0.5)
Eosinophils Relative: 3 %
HCT: 39.3 % (ref 39.0–52.0)
Hemoglobin: 13 g/dL (ref 13.0–17.0)
Immature Granulocytes: 1 %
Lymphocytes Relative: 17 %
Lymphs Abs: 1.4 10*3/uL (ref 0.7–4.0)
MCH: 29.1 pg (ref 26.0–34.0)
MCHC: 33.1 g/dL (ref 30.0–36.0)
MCV: 87.9 fL (ref 80.0–100.0)
Monocytes Absolute: 0.7 10*3/uL (ref 0.1–1.0)
Monocytes Relative: 8 %
Neutro Abs: 5.8 10*3/uL (ref 1.7–7.7)
Neutrophils Relative %: 68 %
Platelet Count: 994 10*3/uL (ref 150–400)
RBC: 4.47 MIL/uL (ref 4.22–5.81)
RDW: 24.9 % — ABNORMAL HIGH (ref 11.5–15.5)
WBC Count: 8.5 10*3/uL (ref 4.0–10.5)
nRBC: 0.5 % — ABNORMAL HIGH (ref 0.0–0.2)

## 2023-04-06 NOTE — Telephone Encounter (Signed)
CRITICAL VALUE STICKER  CRITICAL VALUE: Platelet count 994k  RECEIVER (on-site recipient of call): Binnie Rail, RN  DATE & TIME NOTIFIED: 04/06/23 12:45 pm  MESSENGER (representative from lab): Hilda Lias  MD NOTIFIED: Dr. Leonides Schanz  TIME OF NOTIFICATION: 1300  RESPONSE:  MD aware. Continue current treatment

## 2023-04-27 DIAGNOSIS — E785 Hyperlipidemia, unspecified: Secondary | ICD-10-CM | POA: Diagnosis not present

## 2023-04-27 DIAGNOSIS — N1831 Chronic kidney disease, stage 3a: Secondary | ICD-10-CM | POA: Diagnosis not present

## 2023-04-27 DIAGNOSIS — Z794 Long term (current) use of insulin: Secondary | ICD-10-CM | POA: Diagnosis not present

## 2023-04-27 DIAGNOSIS — I130 Hypertensive heart and chronic kidney disease with heart failure and stage 1 through stage 4 chronic kidney disease, or unspecified chronic kidney disease: Secondary | ICD-10-CM | POA: Diagnosis not present

## 2023-04-27 DIAGNOSIS — E1140 Type 2 diabetes mellitus with diabetic neuropathy, unspecified: Secondary | ICD-10-CM | POA: Diagnosis not present

## 2023-05-04 ENCOUNTER — Inpatient Hospital Stay: Payer: Medicare HMO | Admitting: Hematology and Oncology

## 2023-05-04 ENCOUNTER — Other Ambulatory Visit: Payer: Self-pay | Admitting: Hematology and Oncology

## 2023-05-04 ENCOUNTER — Inpatient Hospital Stay: Payer: Medicare HMO | Attending: Hematology and Oncology

## 2023-05-04 VITALS — BP 154/64 | HR 58 | Temp 97.5°F | Resp 16 | Wt 266.2 lb

## 2023-05-04 DIAGNOSIS — Z7901 Long term (current) use of anticoagulants: Secondary | ICD-10-CM | POA: Diagnosis not present

## 2023-05-04 DIAGNOSIS — D473 Essential (hemorrhagic) thrombocythemia: Secondary | ICD-10-CM | POA: Diagnosis not present

## 2023-05-04 DIAGNOSIS — Z85828 Personal history of other malignant neoplasm of skin: Secondary | ICD-10-CM | POA: Diagnosis not present

## 2023-05-04 DIAGNOSIS — Z87891 Personal history of nicotine dependence: Secondary | ICD-10-CM | POA: Insufficient documentation

## 2023-05-04 LAB — CBC WITH DIFFERENTIAL (CANCER CENTER ONLY)
Abs Immature Granulocytes: 0.07 10*3/uL (ref 0.00–0.07)
Basophils Absolute: 0.2 10*3/uL — ABNORMAL HIGH (ref 0.0–0.1)
Basophils Relative: 2 %
Eosinophils Absolute: 0.2 10*3/uL (ref 0.0–0.5)
Eosinophils Relative: 3 %
HCT: 36.3 % — ABNORMAL LOW (ref 39.0–52.0)
Hemoglobin: 12.4 g/dL — ABNORMAL LOW (ref 13.0–17.0)
Immature Granulocytes: 1 %
Lymphocytes Relative: 11 %
Lymphs Abs: 1 10*3/uL (ref 0.7–4.0)
MCH: 30.5 pg (ref 26.0–34.0)
MCHC: 34.2 g/dL (ref 30.0–36.0)
MCV: 89.4 fL (ref 80.0–100.0)
Monocytes Absolute: 0.6 10*3/uL (ref 0.1–1.0)
Monocytes Relative: 7 %
Neutro Abs: 6.3 10*3/uL (ref 1.7–7.7)
Neutrophils Relative %: 76 %
Platelet Count: 1082 10*3/uL (ref 150–400)
RBC: 4.06 MIL/uL — ABNORMAL LOW (ref 4.22–5.81)
RDW: 24 % — ABNORMAL HIGH (ref 11.5–15.5)
WBC Count: 8.3 10*3/uL (ref 4.0–10.5)
nRBC: 0.2 % (ref 0.0–0.2)

## 2023-05-04 LAB — CMP (CANCER CENTER ONLY)
ALT: 23 U/L (ref 0–44)
AST: 22 U/L (ref 15–41)
Albumin: 3.6 g/dL (ref 3.5–5.0)
Alkaline Phosphatase: 101 U/L (ref 38–126)
Anion gap: 7 (ref 5–15)
BUN: 36 mg/dL — ABNORMAL HIGH (ref 8–23)
CO2: 32 mmol/L (ref 22–32)
Calcium: 9.2 mg/dL (ref 8.9–10.3)
Chloride: 99 mmol/L (ref 98–111)
Creatinine: 1.72 mg/dL — ABNORMAL HIGH (ref 0.61–1.24)
GFR, Estimated: 40 mL/min — ABNORMAL LOW (ref >60)
Glucose, Bld: 357 mg/dL — ABNORMAL HIGH (ref 70–99)
Potassium: 4.1 mmol/L (ref 3.5–5.1)
Sodium: 138 mmol/L (ref 135–145)
Total Bilirubin: 0.5 mg/dL (ref 0.3–1.2)
Total Protein: 6.7 g/dL (ref 6.5–8.1)

## 2023-05-04 MED ORDER — HYDROXYUREA 500 MG PO CAPS: ORAL_CAPSULE | ORAL | 1 refills | Status: DC

## 2023-05-04 NOTE — Progress Notes (Signed)
[facility] Telephone:([phone]) [phone]   [phone]  PROGRESS NOTE  Patient Care Team: Adrian Prince, MD as PCP - General (Endocrinology) Lyn Records, MD (Inactive) as PCP - Cardiology (Cardiology) Hillis Range, MD (Inactive) as PCP - Electrophysiology (Cardiology) Hilarie Fredrickson, MD as Consulting Physician (Gastroenterology) Janalyn Harder, MD (Inactive) as Consulting Physician (Dermatology) Lovenia Shuck, MD as Referring Physician (Ophthalmology) Micki Riley, MD as Consulting Physician (Neurology) Jaci Standard, MD as Consulting Physician (Hematology and Oncology)  Hematological/Oncological History # 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 Darnelle Catalan. 08/08/2022: Establish care with Dr. Leonides Schanz 02/01/2023: Plt 1233, patient stopped medication x several weeks. Restarted on hydroxyurea 500 mg BID.  05/04/2023: WBC 8.3, Hgb 12.4, MCV 89.4, Plt 1082, increased hydroxyurea to 500 in the morning and 1000 at night.  Interval History:  [name] 82 y.o. male with medical history significant for essential thrombocytosis who presents for a follow up visit. The patient's last visit was on 02/01/2023. In the interim since the last visit he has had no major changes in his health.  On exam today [name] reports he has been well overall in the interim since her last visit.  He reports that he is taking his hydroxyurea medication faithfully as prescribed.  He reports no missed dosages.  He notes he is not having any stomach upset and reports that he is not having any mouth ulcers.  He notes his energy levels are good and his appetite is strong.  He eats about 2 meals a day and generally skips breakfast.  He reports that he currently lives in an independent living facility in [place].  Otherwise he does not have any questions concerns or complaints today.[name] Kitchen  He denies any fevers, chills,  sweats, nausea, vomiting or diarrhea.  A full 10 point ROS was otherwise negative.  Today we again discussed the importance of continuing hydroxyurea therapy and the plan moving forward.  The patient voiced understanding and reports that he is faithfully taking his Eliquis and hydroxyurea as prescribed.   MEDICAL HISTORY:  Past Medical History:  Diagnosis Date   Anxiety    Arthritis    Basal cell carcinoma 07/18/1991   Left nasal brdige (MOHS)   Basal cell carcinoma 01/23/1992   lower right back-(CX35FU)   Basal cell carcinoma 05/20/2003   sup-left back (CX35FU)   Basal cell carcinoma 07/28/2011   post lower neck   Basal cell carcinoma 08/20/2008   right sideburn(MOHS), sup-Left upper back (CX35FU), sup-mid back (CX35FU), nod-Right lower back )CX35FU), nod-right upperarm (CX35FU)   Basal cell carcinoma 06/08/2016   sup-Left upper back (CX35FU), mid back (CX35FU), right lower back (CX35FU), nod-Right upperarm (CX35FU)   Depression    Diabetes mellitus without complication (HCC)    GERD (gastroesophageal reflux disease)    Hyperlipidemia    Hypertension    Neuropathy    Obesity    Paroxysmal atrial fibrillation (HCC)    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 (in situ)   Sleep apnea    uses C-pap machine   Squamous cell carcinoma of skin 02/11/2013  in situ-Right temple (CX35FU)   Squamous cell carcinoma of skin 08/20/2008   in situ- front scalp (CX35FU)   Squamous cell carcinoma of skin 06/08/2016   in situ-front scalp (CX35FU)   Squamous cell carcinoma of skin 02/05/2019   in situ-right sideburn-sup (CX35FU), in situ-right sideburn,inf (CX35FU)   Stroke (HCC)     08/09/2017   Superficial basal cell carcinoma (BCC) 01/20/2021   Scalp    SURGICAL HISTORY: Past Surgical History:  Procedure Laterality Date   APPENDECTOMY  1962   BACK SURGERY  00-02-12   x3   BASAL CELL CARCINOMA EXCISION  93/06/10   COLONOSCOPY     KNEE ARTHROSCOPY  005/01/02   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   Procedure: TOTAL LEFT KNEE ARTHROPLASTY;  Surgeon: Ollen Gross, MD;  Location: [facility];  Service: Orthopedics;  Laterality: Left;   TOTAL KNEE ARTHROPLASTY Right 10/08/2018   Procedure: RIGHT TOTAL KNEE ARTHROPLASTY;  Surgeon: Ollen Gross, MD;  Location: [facility];  Service: Orthopedics;  Laterality: Right;     SOCIAL HISTORY: Social History   Socioeconomic History   Marital status: Married    Spouse name: Thurston Hole   Number of children: Not on file   Years of education: Not on file   Highest education level: Not on file  Occupational History   Not on file  Tobacco Use   Smoking status: Former    Current packs/day: 0.00    Average packs/day: 1 pack/day for 30.0 years (30.0 ttl pk-yrs)    Types: Cigars, Cigarettes    Start date: 08/02/1962    Quit date: 08/02/1992    Years since quitting: 30.7   Smokeless tobacco: Never  Vaping Use   Vaping status: Never Used  Substance and Sexual Activity   Alcohol use: Yes    Comment: occasionally   Drug use: No   Sexual activity: Not on file  Other Topics Concern   Not on file  Social History Narrative   Lives w wife   R handed   Caffeine: 1 C of coffee a day   Social Determinants of Health   Financial Resource Strain: Not on file  Food Insecurity: No Food Insecurity (08/28/2022)   Hunger Vital Sign    Worried About Running Out of Food in the Last Year: Never true    Ran Out of Food in the Last Year: Never true  Transportation Needs: No Transportation Needs (08/28/2022)   PRAPARE - Administrator, Civil Service (Medical): No    Lack of Transportation (Non-Medical): No  Physical Activity:  Not on file  Stress: Not on file  Social Connections: Not on file  Intimate Partner Violence: Not At Risk (08/28/2022)   Humiliation, Afraid, Rape, and Kick questionnaire    Fear of Current or Ex-Partner: No    Emotionally Abused: No    Physically Abused: No    Sexually Abused: No    FAMILY HISTORY: Family History  Problem Relation Age of Onset   Cancer Mother        unknown   Diabetes Mellitus II Mother    Hypertension Mother    Heart failure Father    CVA Father    Hypertension Sister    Colon cancer Neg Hx    Pancreatic cancer Neg Hx    Esophageal cancer Neg Hx    Stomach cancer Neg Hx     ALLERGIES:  is allergic to penicillin g sodium.  MEDICATIONS:  Current Outpatient Medications  Medication Sig Dispense Refill  acetaminophen (TYLENOL) 325 MG tablet Take 2 tablets (650 mg total) by mouth every 6 (six) hours as needed for mild pain (or Fever >/= 101).     ALPRAZolam (XANAX) 0.5 MG tablet Take 0.5 mg by mouth 2 (two) times daily.     apixaban (ELIQUIS) 5 MG TABS tablet Take 1 tablet (5 mg total) by mouth 2 (two) times daily. 180 tablet 1   ascorbic acid (VITAMIN C) 500 MG tablet Take 1 tablet (500 mg total) by mouth daily. 14 tablet 0   atorvastatin (LIPITOR) 80 MG tablet Take 1 tablet (80 mg total) by mouth daily at 6 PM. (Patient taking differently: Take 80 mg by mouth at bedtime.) 30 tablet 0   benazepril (LOTENSIN) 40 MG tablet Take 1 tablet by mouth once daily for 90     ergocalciferol (VITAMIN D2) 50000 units capsule Take 50,000 Units by mouth every Sunday.     gabapentin (NEURONTIN) 600 MG tablet Take 600 mg by mouth every evening.     glucose blood (ONETOUCH ULTRA) test strip USE 1 STRIP TO CHECK GLUCOSE 4 TIMES DAILY     hydroxyurea (HYDREA) 500 MG capsule Take 1 capsule (500 mg total) by mouth in the morning AND 2 capsules (1,000 mg total) at bedtime. May take with food to minimize GI side effects.. 270 capsule 1   levothyroxine (SYNTHROID) 75 MCG tablet Take  75 mcg by mouth daily before breakfast.     metFORMIN (GLUCOPHAGE) 1000 MG tablet Take 500 mg by mouth in the morning and at bedtime.     metolazone (ZAROXOLYN) 2.5 MG tablet Take 1 tablet (2.5 mg total) by mouth 2 (two) times a week. Take on Tuesdays and Fridays 30 minutes before taking Torsemide. (Patient taking differently: Take 2.5 mg by mouth See admin instructions. Take 2.5 mg by mouth on Tuesdays and Fridays- 30 minutes before taking Torsemide) 25 tablet 3   metoprolol tartrate (LOPRESSOR) 50 MG tablet Take 50 mg by mouth 2 (two) times daily.     Multiple Vitamin (MULTI VITAMIN DAILY PO)      Multiple Vitamin (MULTIVITAMIN WITH MINERALS) TABS tablet Take 1 tablet by mouth daily with breakfast.     NOVOLIN 70/30 RELION (70-30) 100 UNIT/ML injection Inject 44 Units into the skin in the morning and at bedtime.     omeprazole (PRILOSEC OTC) 20 MG tablet Take 20 mg by mouth daily before breakfast.     ONETOUCH VERIO test strip SMARTSIG:Via Meter     polyethylene glycol powder (MIRALAX) 17 GM/SCOOP powder Please take 6 capfuls of MiraLAX in a 16 oz bottle of Gatorade over 2-4 hour period. The following day take 3 capfuls. On day 3 start taking 1 capful 3 times a day. Slowly cut back as needed until you have normal bowel movements. (Patient taking differently: Take 17 g by mouth daily as needed for mild constipation (mix and drink as directed).) 255 g 0   potassium chloride (KLOR-CON) 10 MEQ tablet Take 10 mEq by mouth daily.     senna-docusate (SENOKOT-S) 8.6-50 MG tablet Take 2 tablets by mouth at bedtime as needed for mild constipation. 30 tablet 0   tamsulosin (FLOMAX) 0.4 MG CAPS capsule Take 0.4 mg by mouth daily.     topiramate (TOPAMAX) 50 MG tablet TAKE 1 TABLET BY MOUTH AT BEDTIME 30 tablet 9   torsemide (DEMADEX) 20 MG tablet Take 3 tablets (60 mg total) by mouth daily. 270 tablet 2   venlafaxine XR (EFFEXOR-XR) 150 MG 24  hr capsule Take 150 mg by mouth daily with breakfast.     No  current facility-administered medications for this visit.    REVIEW OF SYSTEMS:   Constitutional: ( - ) fevers, ( - )  chills , ( - ) night sweats Eyes: ( - ) blurriness of vision, ( - ) double vision, ( - ) watery eyes Ears, nose, mouth, throat, and face: ( - ) mucositis, ( - ) sore throat Respiratory: ( - ) cough, ( - ) dyspnea, ( - ) wheezes Cardiovascular: ( - ) palpitation, ( - ) chest discomfort, ( - ) lower extremity swelling Gastrointestinal:  ( - ) nausea, ( - ) heartburn, ( - ) change in bowel habits Skin: ( - ) abnormal skin rashes Lymphatics: ( - ) new lymphadenopathy, ( - ) easy bruising Neurological: ( - ) numbness, ( - ) tingling, ( - ) new weaknesses Behavioral/Psych: ( - ) mood change, ( - ) new changes  All other systems were reviewed with the patient and are negative.  PHYSICAL EXAMINATION: ECOG PERFORMANCE STATUS: 1 - Symptomatic but completely ambulatory  Vitals:   05/04/23 1520  BP: (!) 154/64  Pulse: (!) 58  Resp: 16  Temp: (!) 97.5 F (36.4 C)  SpO2: 100%     Filed Weights   05/04/23 1520  Weight: 266 lb 3.2 oz (120.7 kg)      GENERAL: Well-appearing elderly Caucasian male alert, no distress and comfortable SKIN: skin color, texture, turgor are normal, no rashes or significant lesions EYES: conjunctiva are pink and non-injected, sclera clear LUNGS: clear to auscultation and percussion with normal breathing effort HEART: regular rate & rhythm and no murmurs and +2 bilateral lower extremity edema Musculoskeletal: no cyanosis of digits and no clubbing  PSYCH: alert & oriented x 3, fluent speech NEURO: no focal motor/sensory deficits  LABORATORY DATA:  I have reviewed the data as listed    Latest Ref Rng & Units 05/04/2023    2:56 PM 04/06/2023   12:10 PM 03/02/2023   12:26 PM  CBC  WBC 4.0 - 10.5 K/uL 8.3  8.5  9.4   Hemoglobin 13.0 - 17.0 g/dL 40.9  81.1  91.4   Hematocrit 39.0 - 52.0 % 36.3  39.3  38.9   Platelets 150 - 400 K/uL 1,082  994   1,319        Latest Ref Rng & Units 05/04/2023    2:56 PM 04/06/2023   12:10 PM 03/02/2023   12:26 PM  CMP  Glucose 70 - 99 mg/dL 782  956  213   BUN 8 - 23 mg/dL 36  37  42   Creatinine 0.61 - 1.24 mg/dL 0.86  5.78  4.69   Sodium 135 - 145 mmol/L 138  140  136   Potassium 3.5 - 5.1 mmol/L 4.1  4.0  3.6   Chloride 98 - 111 mmol/L 99  102  97   CO2 22 - 32 mmol/L 32  30  31   Calcium 8.9 - 10.3 mg/dL 9.2  9.9  9.7   Total Protein 6.5 - 8.1 g/dL 6.7  6.2  6.8   Total Bilirubin 0.3 - 1.2 mg/dL 0.5  0.7  0.6   Alkaline Phos 38 - 126 U/L 101  118  115   AST 15 - 41 U/L 22  18  21    ALT 0 - 44 U/L 23  24  22      RADIOGRAPHIC STUDIES: No results found.  ASSESSMENT & PLAN [name] 82 y.o. male with medical history significant for essential thrombocytosis who presents for a follow up visit.   (1) essential thrombocytosis: Started on Hydrea 1000 mg a day 10/06/2017             (a) Hydrea dose increased to 1500 mg daily beginning 06/27/2019   (2) bone marrow biopsy discussed, postponed   (3) 0.7 cm right upper lung nodule incidentally noted on neck CT scans November 2018             (a) no change on repeat CT of the chest with contrast 02/28/2018             (b) chest x-ray 10/13/2018 obtained for evaluation of a cough shows no mention of a mass  # Essential Thrombocytosis JAK2 Positive.  -- Recommend [name] increase his hydroxyurea to 1 pill in the morning and 2 pills in the evening --Labs today show white blood cell count 8.3, Hgb 12.4, MCV 89.4, Plt 1082 --Given the poor response and platelets and the lack of an elevation of the MCV I am concerned the patient may not be taking his medication as prescribed. --Patient currently tolerating medication well with no major side effects. --Continue Eliquis 5 mg twice daily.  This will provide thromboprophylaxis, no need for aspirin therapy. --plan for labs in 1 and 2 months time with clinic visit in 3 months.   No orders of the  defined types were placed in this encounter.   All questions were answered. The patient knows to call the clinic with any problems, questions or concerns.  A total of more than 30 minutes were spent on this encounter with face-to-face time and non-face-to-face time, including preparing to see the patient, ordering tests and/or medications, counseling the patient and coordination of care as outlined above.   Ulysees Barns, MD Department of Hematology/Oncology [facility] Phone: [phone] Pager: [phone] Email: [email or web address]  05/04/2023 4:32 PM

## 2023-05-27 ENCOUNTER — Other Ambulatory Visit: Payer: Self-pay | Admitting: Hematology and Oncology

## 2023-05-27 DIAGNOSIS — D473 Essential (hemorrhagic) thrombocythemia: Secondary | ICD-10-CM

## 2023-05-29 ENCOUNTER — Inpatient Hospital Stay: Payer: Medicare HMO | Attending: Hematology and Oncology

## 2023-05-29 DIAGNOSIS — D473 Essential (hemorrhagic) thrombocythemia: Secondary | ICD-10-CM | POA: Insufficient documentation

## 2023-05-29 LAB — CBC WITH DIFFERENTIAL (CANCER CENTER ONLY)
Abs Immature Granulocytes: 0.02 10*3/uL (ref 0.00–0.07)
Basophils Absolute: 0.1 10*3/uL (ref 0.0–0.1)
Basophils Relative: 3 %
Eosinophils Absolute: 0.3 10*3/uL (ref 0.0–0.5)
Eosinophils Relative: 5 %
HCT: 36.5 % — ABNORMAL LOW (ref 39.0–52.0)
Hemoglobin: 11.8 g/dL — ABNORMAL LOW (ref 13.0–17.0)
Immature Granulocytes: 0 %
Lymphocytes Relative: 18 %
Lymphs Abs: 1 10*3/uL (ref 0.7–4.0)
MCH: 29.9 pg (ref 26.0–34.0)
MCHC: 32.3 g/dL (ref 30.0–36.0)
MCV: 92.6 fL (ref 80.0–100.0)
Monocytes Absolute: 0.3 10*3/uL (ref 0.1–1.0)
Monocytes Relative: 6 %
Neutro Abs: 3.8 10*3/uL (ref 1.7–7.7)
Neutrophils Relative %: 68 %
Platelet Count: 815 10*3/uL — ABNORMAL HIGH (ref 150–400)
RBC: 3.94 MIL/uL — ABNORMAL LOW (ref 4.22–5.81)
RDW: 23.3 % — ABNORMAL HIGH (ref 11.5–15.5)
WBC Count: 5.5 10*3/uL (ref 4.0–10.5)
nRBC: 0 % (ref 0.0–0.2)

## 2023-05-29 LAB — CMP (CANCER CENTER ONLY)
ALT: 22 U/L (ref 0–44)
AST: 21 U/L (ref 15–41)
Albumin: 3.7 g/dL (ref 3.5–5.0)
Alkaline Phosphatase: 102 U/L (ref 38–126)
Anion gap: 4 — ABNORMAL LOW (ref 5–15)
BUN: 37 mg/dL — ABNORMAL HIGH (ref 8–23)
CO2: 34 mmol/L — ABNORMAL HIGH (ref 22–32)
Calcium: 9.4 mg/dL (ref 8.9–10.3)
Chloride: 99 mmol/L (ref 98–111)
Creatinine: 1.7 mg/dL — ABNORMAL HIGH (ref 0.61–1.24)
GFR, Estimated: 41 mL/min — ABNORMAL LOW (ref >60)
Glucose, Bld: 352 mg/dL — ABNORMAL HIGH (ref 70–99)
Potassium: 4.6 mmol/L (ref 3.5–5.1)
Sodium: 137 mmol/L (ref 135–145)
Total Bilirubin: 0.6 mg/dL (ref 0.3–1.2)
Total Protein: 6.7 g/dL (ref 6.5–8.1)

## 2023-06-14 DIAGNOSIS — E11621 Type 2 diabetes mellitus with foot ulcer: Secondary | ICD-10-CM | POA: Diagnosis not present

## 2023-06-14 DIAGNOSIS — I872 Venous insufficiency (chronic) (peripheral): Secondary | ICD-10-CM | POA: Diagnosis not present

## 2023-06-14 DIAGNOSIS — L089 Local infection of the skin and subcutaneous tissue, unspecified: Secondary | ICD-10-CM | POA: Diagnosis not present

## 2023-06-14 DIAGNOSIS — L97511 Non-pressure chronic ulcer of other part of right foot limited to breakdown of skin: Secondary | ICD-10-CM | POA: Diagnosis not present

## 2023-06-14 DIAGNOSIS — K5909 Other constipation: Secondary | ICD-10-CM | POA: Diagnosis not present

## 2023-06-14 DIAGNOSIS — Z23 Encounter for immunization: Secondary | ICD-10-CM | POA: Diagnosis not present

## 2023-06-14 DIAGNOSIS — E1140 Type 2 diabetes mellitus with diabetic neuropathy, unspecified: Secondary | ICD-10-CM | POA: Diagnosis not present

## 2023-06-14 DIAGNOSIS — L03039 Cellulitis of unspecified toe: Secondary | ICD-10-CM | POA: Diagnosis not present

## 2023-06-16 ENCOUNTER — Other Ambulatory Visit: Payer: Self-pay

## 2023-06-16 MED ORDER — METOLAZONE 2.5 MG PO TABS: 2.5000 mg | ORAL_TABLET | ORAL | 0 refills | Status: DC

## 2023-06-22 DIAGNOSIS — D224 Melanocytic nevi of scalp and neck: Secondary | ICD-10-CM | POA: Diagnosis not present

## 2023-06-22 DIAGNOSIS — L821 Other seborrheic keratosis: Secondary | ICD-10-CM | POA: Diagnosis not present

## 2023-06-22 DIAGNOSIS — L570 Actinic keratosis: Secondary | ICD-10-CM | POA: Diagnosis not present

## 2023-06-26 ENCOUNTER — Inpatient Hospital Stay: Payer: Medicare HMO | Attending: Hematology and Oncology

## 2023-06-26 ENCOUNTER — Telehealth: Payer: Self-pay

## 2023-06-26 ENCOUNTER — Other Ambulatory Visit: Payer: Self-pay

## 2023-06-26 ENCOUNTER — Telehealth: Payer: Self-pay | Admitting: *Deleted

## 2023-06-26 ENCOUNTER — Other Ambulatory Visit: Payer: Self-pay | Admitting: Hematology and Oncology

## 2023-06-26 DIAGNOSIS — D473 Essential (hemorrhagic) thrombocythemia: Secondary | ICD-10-CM | POA: Insufficient documentation

## 2023-06-26 LAB — CMP (CANCER CENTER ONLY)
ALT: 20 U/L (ref 0–44)
AST: 17 U/L (ref 15–41)
Albumin: 3.6 g/dL (ref 3.5–5.0)
Alkaline Phosphatase: 95 U/L (ref 38–126)
Anion gap: 4 — ABNORMAL LOW (ref 5–15)
BUN: 29 mg/dL — ABNORMAL HIGH (ref 8–23)
CO2: 27 mmol/L (ref 22–32)
Calcium: 9.3 mg/dL (ref 8.9–10.3)
Chloride: 104 mmol/L (ref 98–111)
Creatinine: 1.48 mg/dL — ABNORMAL HIGH (ref 0.61–1.24)
GFR, Estimated: 48 mL/min — ABNORMAL LOW (ref >60)
Glucose, Bld: 288 mg/dL — ABNORMAL HIGH (ref 70–99)
Potassium: 4.3 mmol/L (ref 3.5–5.1)
Sodium: 135 mmol/L (ref 135–145)
Total Bilirubin: 0.6 mg/dL (ref 0.3–1.2)
Total Protein: 6.5 g/dL (ref 6.5–8.1)

## 2023-06-26 LAB — CBC WITH DIFFERENTIAL (CANCER CENTER ONLY)
Abs Immature Granulocytes: 0.02 10*3/uL (ref 0.00–0.07)
Basophils Absolute: 0.1 10*3/uL (ref 0.0–0.1)
Basophils Relative: 2 %
Eosinophils Absolute: 0.2 10*3/uL (ref 0.0–0.5)
Eosinophils Relative: 4 %
HCT: 31.9 % — ABNORMAL LOW (ref 39.0–52.0)
Hemoglobin: 10.3 g/dL — ABNORMAL LOW (ref 13.0–17.0)
Immature Granulocytes: 0 %
Lymphocytes Relative: 17 %
Lymphs Abs: 1.1 10*3/uL (ref 0.7–4.0)
MCH: 29.9 pg (ref 26.0–34.0)
MCHC: 32.3 g/dL (ref 30.0–36.0)
MCV: 92.5 fL (ref 80.0–100.0)
Monocytes Absolute: 0.4 10*3/uL (ref 0.1–1.0)
Monocytes Relative: 6 %
Neutro Abs: 4.3 10*3/uL (ref 1.7–7.7)
Neutrophils Relative %: 71 %
Platelet Count: 956 10*3/uL (ref 150–400)
RBC: 3.45 MIL/uL — ABNORMAL LOW (ref 4.22–5.81)
RDW: 23.2 % — ABNORMAL HIGH (ref 11.5–15.5)
WBC Count: 6.1 10*3/uL (ref 4.0–10.5)
nRBC: 0 % (ref 0.0–0.2)

## 2023-06-26 NOTE — Telephone Encounter (Signed)
CRITICAL VALUE STICKER  CRITICAL VALUE: Platelet 756 (improving)  RECEIVER (on-site recipient of call): Creola Corn, RN  DATE & TIME NOTIFIED: 06/26/2023 @ 12:38 pm  MESSENGER (representative from lab): Cordelia Pen  MD NOTIFIED: Leonides Schanz  TIME OF NOTIFICATION: 12:40 pm  RESPONSE: he will communicate instructions to his nurse

## 2023-06-26 NOTE — Telephone Encounter (Signed)
This RN called pt and LVM to inform him that his Hydroxyurea prescription should be available for pick up for three pills per day at his requested pharmacy. This RN informed pt he may need to call the pharmacy to be sure it is ready but a refill is available. Call back number [phone] provided.

## 2023-07-03 DIAGNOSIS — D473 Essential (hemorrhagic) thrombocythemia: Secondary | ICD-10-CM | POA: Diagnosis not present

## 2023-07-03 DIAGNOSIS — Z794 Long term (current) use of insulin: Secondary | ICD-10-CM | POA: Diagnosis not present

## 2023-07-03 DIAGNOSIS — I700 Atherosclerosis of aorta: Secondary | ICD-10-CM | POA: Diagnosis not present

## 2023-07-03 DIAGNOSIS — I130 Hypertensive heart and chronic kidney disease with heart failure and stage 1 through stage 4 chronic kidney disease, or unspecified chronic kidney disease: Secondary | ICD-10-CM | POA: Diagnosis not present

## 2023-07-03 DIAGNOSIS — K592 Neurogenic bowel, not elsewhere classified: Secondary | ICD-10-CM | POA: Diagnosis not present

## 2023-07-03 DIAGNOSIS — N1831 Chronic kidney disease, stage 3a: Secondary | ICD-10-CM | POA: Diagnosis not present

## 2023-07-03 DIAGNOSIS — I480 Paroxysmal atrial fibrillation: Secondary | ICD-10-CM | POA: Diagnosis not present

## 2023-07-03 DIAGNOSIS — I5032 Chronic diastolic (congestive) heart failure: Secondary | ICD-10-CM | POA: Diagnosis not present

## 2023-07-03 DIAGNOSIS — C4490 Unspecified malignant neoplasm of skin, unspecified: Secondary | ICD-10-CM | POA: Diagnosis not present

## 2023-07-03 DIAGNOSIS — E1140 Type 2 diabetes mellitus with diabetic neuropathy, unspecified: Secondary | ICD-10-CM | POA: Diagnosis not present

## 2023-07-03 DIAGNOSIS — E11319 Type 2 diabetes mellitus with unspecified diabetic retinopathy without macular edema: Secondary | ICD-10-CM | POA: Diagnosis not present

## 2023-07-04 DIAGNOSIS — E11649 Type 2 diabetes mellitus with hypoglycemia without coma: Secondary | ICD-10-CM | POA: Diagnosis not present

## 2023-07-04 DIAGNOSIS — G319 Degenerative disease of nervous system, unspecified: Secondary | ICD-10-CM | POA: Diagnosis not present

## 2023-07-04 DIAGNOSIS — Z743 Need for continuous supervision: Secondary | ICD-10-CM | POA: Diagnosis not present

## 2023-07-04 DIAGNOSIS — N179 Acute kidney failure, unspecified: Secondary | ICD-10-CM | POA: Diagnosis not present

## 2023-07-04 DIAGNOSIS — Z043 Encounter for examination and observation following other accident: Secondary | ICD-10-CM | POA: Diagnosis not present

## 2023-07-04 DIAGNOSIS — I6782 Cerebral ischemia: Secondary | ICD-10-CM | POA: Diagnosis not present

## 2023-07-04 DIAGNOSIS — R4182 Altered mental status, unspecified: Secondary | ICD-10-CM | POA: Diagnosis not present

## 2023-07-04 DIAGNOSIS — R0989 Other specified symptoms and signs involving the circulatory and respiratory systems: Secondary | ICD-10-CM | POA: Diagnosis not present

## 2023-07-04 DIAGNOSIS — E162 Hypoglycemia, unspecified: Secondary | ICD-10-CM | POA: Diagnosis not present

## 2023-07-04 DIAGNOSIS — Z7984 Long term (current) use of oral hypoglycemic drugs: Secondary | ICD-10-CM | POA: Diagnosis not present

## 2023-07-04 DIAGNOSIS — W19XXXA Unspecified fall, initial encounter: Secondary | ICD-10-CM | POA: Diagnosis not present

## 2023-07-04 DIAGNOSIS — E1122 Type 2 diabetes mellitus with diabetic chronic kidney disease: Secondary | ICD-10-CM | POA: Diagnosis not present

## 2023-07-04 DIAGNOSIS — R001 Bradycardia, unspecified: Secondary | ICD-10-CM | POA: Diagnosis not present

## 2023-07-04 DIAGNOSIS — T383X1A Poisoning by insulin and oral hypoglycemic [antidiabetic] drugs, accidental (unintentional), initial encounter: Secondary | ICD-10-CM | POA: Diagnosis not present

## 2023-07-04 DIAGNOSIS — R29818 Other symptoms and signs involving the nervous system: Secondary | ICD-10-CM | POA: Diagnosis not present

## 2023-07-04 DIAGNOSIS — Z7901 Long term (current) use of anticoagulants: Secondary | ICD-10-CM | POA: Diagnosis not present

## 2023-07-04 DIAGNOSIS — R42 Dizziness and giddiness: Secondary | ICD-10-CM | POA: Diagnosis not present

## 2023-07-04 DIAGNOSIS — J329 Chronic sinusitis, unspecified: Secondary | ICD-10-CM | POA: Diagnosis not present

## 2023-07-04 DIAGNOSIS — I517 Cardiomegaly: Secondary | ICD-10-CM | POA: Diagnosis not present

## 2023-07-04 DIAGNOSIS — Z794 Long term (current) use of insulin: Secondary | ICD-10-CM | POA: Diagnosis not present

## 2023-07-04 DIAGNOSIS — X58XXXA Exposure to other specified factors, initial encounter: Secondary | ICD-10-CM | POA: Diagnosis not present

## 2023-07-04 DIAGNOSIS — I509 Heart failure, unspecified: Secondary | ICD-10-CM | POA: Diagnosis not present

## 2023-07-05 DIAGNOSIS — E11649 Type 2 diabetes mellitus with hypoglycemia without coma: Secondary | ICD-10-CM | POA: Diagnosis not present

## 2023-07-05 DIAGNOSIS — N179 Acute kidney failure, unspecified: Secondary | ICD-10-CM | POA: Diagnosis not present

## 2023-07-05 DIAGNOSIS — T383X1A Poisoning by insulin and oral hypoglycemic [antidiabetic] drugs, accidental (unintentional), initial encounter: Secondary | ICD-10-CM | POA: Diagnosis not present

## 2023-07-05 DIAGNOSIS — R4182 Altered mental status, unspecified: Secondary | ICD-10-CM | POA: Diagnosis not present

## 2023-07-17 DIAGNOSIS — H3582 Retinal ischemia: Secondary | ICD-10-CM | POA: Diagnosis not present

## 2023-07-17 DIAGNOSIS — H02423 Myogenic ptosis of bilateral eyelids: Secondary | ICD-10-CM | POA: Diagnosis not present

## 2023-07-17 DIAGNOSIS — H35373 Puckering of macula, bilateral: Secondary | ICD-10-CM | POA: Diagnosis not present

## 2023-07-17 DIAGNOSIS — E113513 Type 2 diabetes mellitus with proliferative diabetic retinopathy with macular edema, bilateral: Secondary | ICD-10-CM | POA: Diagnosis not present

## 2023-07-27 DIAGNOSIS — Z794 Long term (current) use of insulin: Secondary | ICD-10-CM | POA: Diagnosis not present

## 2023-07-27 DIAGNOSIS — E1140 Type 2 diabetes mellitus with diabetic neuropathy, unspecified: Secondary | ICD-10-CM | POA: Diagnosis not present

## 2023-07-27 DIAGNOSIS — N1831 Chronic kidney disease, stage 3a: Secondary | ICD-10-CM | POA: Diagnosis not present

## 2023-07-27 DIAGNOSIS — I130 Hypertensive heart and chronic kidney disease with heart failure and stage 1 through stage 4 chronic kidney disease, or unspecified chronic kidney disease: Secondary | ICD-10-CM | POA: Diagnosis not present

## 2023-07-27 DIAGNOSIS — E785 Hyperlipidemia, unspecified: Secondary | ICD-10-CM | POA: Diagnosis not present

## 2023-07-31 ENCOUNTER — Other Ambulatory Visit: Payer: Self-pay | Admitting: Hematology and Oncology

## 2023-07-31 ENCOUNTER — Inpatient Hospital Stay: Payer: Medicare HMO | Admitting: Hematology and Oncology

## 2023-07-31 ENCOUNTER — Inpatient Hospital Stay: Payer: Medicare HMO | Attending: Hematology and Oncology

## 2023-07-31 VITALS — BP 139/61 | HR 55 | Temp 97.1°F | Resp 16 | Wt 260.9 lb

## 2023-07-31 DIAGNOSIS — Z87891 Personal history of nicotine dependence: Secondary | ICD-10-CM | POA: Diagnosis not present

## 2023-07-31 DIAGNOSIS — Z85828 Personal history of other malignant neoplasm of skin: Secondary | ICD-10-CM | POA: Diagnosis not present

## 2023-07-31 DIAGNOSIS — D473 Essential (hemorrhagic) thrombocythemia: Secondary | ICD-10-CM

## 2023-07-31 DIAGNOSIS — Z7901 Long term (current) use of anticoagulants: Secondary | ICD-10-CM | POA: Insufficient documentation

## 2023-07-31 DIAGNOSIS — H538 Other visual disturbances: Secondary | ICD-10-CM | POA: Diagnosis not present

## 2023-07-31 DIAGNOSIS — D75839 Thrombocytosis, unspecified: Secondary | ICD-10-CM | POA: Diagnosis not present

## 2023-07-31 LAB — CMP (CANCER CENTER ONLY)
ALT: 24 U/L (ref 0–44)
AST: 22 U/L (ref 15–41)
Albumin: 3.9 g/dL (ref 3.5–5.0)
Alkaline Phosphatase: 93 U/L (ref 38–126)
Anion gap: 6 (ref 5–15)
BUN: 42 mg/dL — ABNORMAL HIGH (ref 8–23)
CO2: 36 mmol/L — ABNORMAL HIGH (ref 22–32)
Calcium: 10 mg/dL (ref 8.9–10.3)
Chloride: 98 mmol/L (ref 98–111)
Creatinine: 1.78 mg/dL — ABNORMAL HIGH (ref 0.61–1.24)
GFR, Estimated: 38 mL/min — ABNORMAL LOW (ref >60)
Glucose, Bld: 128 mg/dL — ABNORMAL HIGH (ref 70–99)
Potassium: 3.7 mmol/L (ref 3.5–5.1)
Sodium: 140 mmol/L (ref 135–145)
Total Bilirubin: 0.9 mg/dL (ref <1.2)
Total Protein: 7.1 g/dL (ref 6.5–8.1)

## 2023-07-31 LAB — CBC WITH DIFFERENTIAL (CANCER CENTER ONLY)
Abs Immature Granulocytes: 0.02 10*3/uL (ref 0.00–0.07)
Basophils Absolute: 0.1 10*3/uL (ref 0.0–0.1)
Basophils Relative: 2 %
Eosinophils Absolute: 0.1 10*3/uL (ref 0.0–0.5)
Eosinophils Relative: 2 %
HCT: 34.3 % — ABNORMAL LOW (ref 39.0–52.0)
Hemoglobin: 11.9 g/dL — ABNORMAL LOW (ref 13.0–17.0)
Immature Granulocytes: 0 %
Lymphocytes Relative: 15 %
Lymphs Abs: 0.8 10*3/uL (ref 0.7–4.0)
MCH: 33.1 pg (ref 26.0–34.0)
MCHC: 34.7 g/dL (ref 30.0–36.0)
MCV: 95.3 fL (ref 80.0–100.0)
Monocytes Absolute: 0.3 10*3/uL (ref 0.1–1.0)
Monocytes Relative: 6 %
Neutro Abs: 3.8 10*3/uL (ref 1.7–7.7)
Neutrophils Relative %: 75 %
Platelet Count: 765 10*3/uL — ABNORMAL HIGH (ref 150–400)
RBC: 3.6 MIL/uL — ABNORMAL LOW (ref 4.22–5.81)
RDW: 24.6 % — ABNORMAL HIGH (ref 11.5–15.5)
WBC Count: 5.1 10*3/uL (ref 4.0–10.5)
nRBC: 0 % (ref 0.0–0.2)

## 2023-07-31 MED ORDER — HYDROXYUREA 500 MG PO CAPS: 1000.0000 mg | ORAL_CAPSULE | Freq: Two times a day (BID) | ORAL | 3 refills | Status: DC

## 2023-07-31 MED ORDER — HYDROXYUREA 500 MG PO CAPS: 1000.0000 mg | ORAL_CAPSULE | Freq: Two times a day (BID) | ORAL | 1 refills | Status: DC

## 2023-07-31 NOTE — Progress Notes (Signed)
[facility] [phone]   [phone]  PROGRESS NOTE  Patient Care Team: Adrian Prince, MD as PCP - General (Endocrinology) Lyn Records, MD (Inactive) as PCP - Cardiology (Cardiology) Hillis Range, MD (Inactive) as PCP - Electrophysiology (Cardiology) Hilarie Fredrickson, MD as Consulting Physician (Gastroenterology) Janalyn Harder, MD (Inactive) as Consulting Physician (Dermatology) Lovenia Shuck, MD as Referring Physician (Ophthalmology) Micki Riley, MD as Consulting Physician (Neurology) Jaci Standard, MD as Consulting Physician (Hematology and Oncology)  Hematological/Oncological History # 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 Darnelle Catalan. 08/08/2022: Establish care with Dr. Leonides Schanz 02/01/2023: Plt 1233, patient stopped medication x several weeks. Restarted on hydroxyurea 500 mg BID.  05/04/2023: WBC 8.3, Hgb 12.4, MCV 89.4, Plt 1082, increased hydroxyurea to 500 in the morning and 1000 at night.  Interval History:  CONN BUNDICK 82 y.o. male with medical history significant for essential thrombocytosis who presents for a follow up visit. The patient's last visit was on 05/04/2023. In the interim since the last visit he has had no major changes in his health.  On exam today Mr. Wooters reports he has been feeling okay in the interim since our last visit.  He reports been having some trouble with his eyes with some blurring in shading.  He notes he has been seen by his ophthalmologist and PCP who are investigating this.  He reports that he thinks there may be a correlation with his blood sugars and is working on a different type of insulin.  The patient reports that he is faithfully taking his hydroxyurea once in the morning and twice at night.  It is not causing any nausea, vomiting, or diarrhea.  He denies any ulcers in the mouth or on the ankles..  Otherwise he does  not have any questions concerns or complaints today.  He denies any fevers, chills, sweats, nausea, vomiting or diarrhea.  A full 10 point ROS was otherwise negative.  We discussed the importance of continuing hydroxyurea therapy and the plan moving forward.  The patient voiced understanding and reports that he is faithfully taking his Eliquis and hydroxyurea as prescribed.   MEDICAL HISTORY:  Past Medical History:  Diagnosis Date   Anxiety    Arthritis    Basal cell carcinoma 07/18/1991   Left nasal brdige (MOHS)   Basal cell carcinoma 01/23/1992   lower right back-(CX35FU)   Basal cell carcinoma 05/20/2003   sup-left back (CX35FU)   Basal cell carcinoma 07/28/2011   post lower neck   Basal cell carcinoma 08/20/2008   right sideburn(MOHS), sup-Left upper back (CX35FU), sup-mid back (CX35FU), nod-Right lower back )CX35FU), nod-right upperarm (CX35FU)   Basal cell carcinoma 06/08/2016   sup-Left upper back (CX35FU), mid back (CX35FU), right lower back (CX35FU), nod-Right upperarm (CX35FU)   Depression    Diabetes mellitus without complication (HCC)    GERD (gastroesophageal reflux disease)    Hyperlipidemia    Hypertension    Neuropathy    Obesity    Paroxysmal atrial fibrillation (HCC)    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 (in situ)   Sleep apnea  uses C-pap machine   Squamous cell carcinoma of skin 02/11/2013   in situ-Right temple (CX35FU)   Squamous cell carcinoma of skin 08/20/2008   in situ- front scalp (CX35FU)   Squamous cell carcinoma of skin 06/08/2016   in situ-front scalp (CX35FU)   Squamous cell carcinoma of skin 02/05/2019   in situ-right  sideburn-sup (CX35FU), in situ-right sideburn,inf (CX35FU)   Stroke (HCC)    08/09/2017   Superficial basal cell carcinoma (BCC) 01/20/2021   Scalp    SURGICAL HISTORY: Past Surgical History:  Procedure Laterality Date   APPENDECTOMY  1962   BACK SURGERY  00-02-12   x3   BASAL CELL CARCINOMA EXCISION  93/06/10   COLONOSCOPY     KNEE ARTHROSCOPY  005/01/02   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   Procedure: TOTAL LEFT KNEE ARTHROPLASTY;  Surgeon: Ollen Gross, MD;  Location: [facility];  Service: Orthopedics;  Laterality: Left;   TOTAL KNEE ARTHROPLASTY Right 10/08/2018   Procedure: RIGHT TOTAL KNEE ARTHROPLASTY;  Surgeon: Ollen Gross, MD;  Location: [facility];  Service: Orthopedics;  Laterality: Right;     SOCIAL HISTORY: Social History   Socioeconomic History   Marital status: Married    Spouse name: Thurston Hole   Number of children: Not on file   Years of education: Not on file   Highest education level: Not on file  Occupational History   Not on file  Tobacco Use   Smoking status: Former    Current packs/day: 0.00    Average packs/day: 1 pack/day for 30.0 years (30.0 ttl pk-yrs)    Types: Cigars, Cigarettes    Start date: 08/02/1962    Quit date: 08/02/1992    Years since quitting: 31.0   Smokeless tobacco: Never  Vaping Use   Vaping status: Never Used  Substance and Sexual Activity   Alcohol use: Yes    Comment: occasionally   Drug use: No   Sexual activity: Not on file  Other Topics Concern   Not on file  Social History Narrative   Lives w wife   R handed   Caffeine: 1 C of coffee a day   Social Determinants of Health   Financial Resource Strain: Not on file  Food Insecurity: No Food Insecurity (08/28/2022)   Hunger Vital Sign    Worried About Running Out of Food in the Last Year: Never true    Ran Out of Food in the Last Year: Never true  Transportation Needs: No Transportation Needs (08/28/2022)   PRAPARE - Administrator, Civil Service  (Medical): No    Lack of Transportation (Non-Medical): No  Physical Activity: Not on file  Stress: Not on file  Social Connections: Not on file  Intimate Partner Violence: Not At Risk (08/28/2022)   Humiliation, Afraid, Rape, and Kick questionnaire    Fear of Current or Ex-Partner: No    Emotionally Abused: No    Physically Abused: No    Sexually Abused: No    FAMILY HISTORY: Family History  Problem Relation Age of Onset   Cancer Mother        unknown   Diabetes Mellitus II Mother    Hypertension Mother    Heart failure Father    CVA Father    Hypertension Sister    Colon cancer Neg Hx    Pancreatic cancer Neg Hx    Esophageal cancer Neg Hx    Stomach cancer Neg Hx     ALLERGIES:  is allergic to penicillin  g sodium.  MEDICATIONS:  Current Outpatient Medications  Medication Sig Dispense Refill   acetaminophen (TYLENOL) 325 MG tablet Take 2 tablets (650 mg total) by mouth every 6 (six) hours as needed for mild pain (or Fever >/= 101).     ALPRAZolam (XANAX) 0.5 MG tablet Take 0.5 mg by mouth 2 (two) times daily.     apixaban (ELIQUIS) 5 MG TABS tablet Take 1 tablet (5 mg total) by mouth 2 (two) times daily. 180 tablet 1   ascorbic acid (VITAMIN C) 500 MG tablet Take 1 tablet (500 mg total) by mouth daily. 14 tablet 0   atorvastatin (LIPITOR) 80 MG tablet Take 1 tablet (80 mg total) by mouth daily at 6 PM. (Patient taking differently: Take 80 mg by mouth at bedtime.) 30 tablet 0   benazepril (LOTENSIN) 40 MG tablet Take 1 tablet by mouth once daily for 90     ergocalciferol (VITAMIN D2) 50000 units capsule Take 50,000 Units by mouth every Sunday.     gabapentin (NEURONTIN) 600 MG tablet Take 600 mg by mouth every evening.     glucose blood (ONETOUCH ULTRA) test strip USE 1 STRIP TO CHECK GLUCOSE 4 TIMES DAILY     hydroxyurea (HYDREA) 500 MG capsule Take 2 capsules (1,000 mg total) by mouth 2 (two) times daily. May take with food to minimize GI side effects. 360 capsule 1    levothyroxine (SYNTHROID) 75 MCG tablet Take 75 mcg by mouth daily before breakfast.     metFORMIN (GLUCOPHAGE) 1000 MG tablet Take 500 mg by mouth in the morning and at bedtime.     metolazone (ZAROXOLYN) 2.5 MG tablet Take 1 tablet (2.5 mg total) by mouth 2 (two) times a week. Take on Tuesdays and Fridays 30 minutes before taking Torsemide. 25 tablet 0   metoprolol tartrate (LOPRESSOR) 50 MG tablet Take 50 mg by mouth 2 (two) times daily.     Multiple Vitamin (MULTI VITAMIN DAILY PO)      Multiple Vitamin (MULTIVITAMIN WITH MINERALS) TABS tablet Take 1 tablet by mouth daily with breakfast.     NOVOLIN 70/30 RELION (70-30) 100 UNIT/ML injection Inject 44 Units into the skin in the morning and at bedtime.     omeprazole (PRILOSEC OTC) 20 MG tablet Take 20 mg by mouth daily before breakfast.     ONETOUCH VERIO test strip SMARTSIG:Via Meter     polyethylene glycol powder (MIRALAX) 17 GM/SCOOP powder Please take 6 capfuls of MiraLAX in a 16 oz bottle of Gatorade over 2-4 hour period. The following day take 3 capfuls. On day 3 start taking 1 capful 3 times a day. Slowly cut back as needed until you have normal bowel movements. (Patient taking differently: Take 17 g by mouth daily as needed for mild constipation (mix and drink as directed).) 255 g 0   potassium chloride (KLOR-CON) 10 MEQ tablet Take 10 mEq by mouth daily.     senna-docusate (SENOKOT-S) 8.6-50 MG tablet Take 2 tablets by mouth at bedtime as needed for mild constipation. 30 tablet 0   tamsulosin (FLOMAX) 0.4 MG CAPS capsule Take 0.4 mg by mouth daily.     topiramate (TOPAMAX) 50 MG tablet TAKE 1 TABLET BY MOUTH AT BEDTIME 30 tablet 9   torsemide (DEMADEX) 20 MG tablet Take 3 tablets (60 mg total) by mouth daily. 270 tablet 2   venlafaxine XR (EFFEXOR-XR) 150 MG 24 hr capsule Take 150 mg by mouth daily with breakfast.     No current facility-administered  medications for this visit.    REVIEW OF SYSTEMS:   Constitutional: ( - ) fevers, (  - )  chills , ( - ) night sweats Eyes: ( - ) blurriness of vision, ( - ) double vision, ( - ) watery eyes Ears, nose, mouth, throat, and face: ( - ) mucositis, ( - ) sore throat Respiratory: ( - ) cough, ( - ) dyspnea, ( - ) wheezes Cardiovascular: ( - ) palpitation, ( - ) chest discomfort, ( - ) lower extremity swelling Gastrointestinal:  ( - ) nausea, ( - ) heartburn, ( - ) change in bowel habits Skin: ( - ) abnormal skin rashes Lymphatics: ( - ) new lymphadenopathy, ( - ) easy bruising Neurological: ( - ) numbness, ( - ) tingling, ( - ) new weaknesses Behavioral/Psych: ( - ) mood change, ( - ) new changes  All other systems were reviewed with the patient and are negative.  PHYSICAL EXAMINATION: ECOG PERFORMANCE STATUS: 1 - Symptomatic but completely ambulatory  Vitals:   07/31/23 1443  BP: 139/61  Pulse: (!) 55  Resp: 16  Temp: (!) 97.1 F (36.2 C)  SpO2: 96%     Filed Weights   07/31/23 1443  Weight: 260 lb 14.4 oz (118.3 kg)      GENERAL: Well-appearing elderly Caucasian male alert, no distress and comfortable SKIN: skin color, texture, turgor are normal, no rashes or significant lesions EYES: conjunctiva are pink and non-injected, sclera clear LUNGS: clear to auscultation and percussion with normal breathing effort HEART: regular rate & rhythm and no murmurs and +2 bilateral lower extremity edema Musculoskeletal: no cyanosis of digits and no clubbing  PSYCH: alert & oriented x 3, fluent speech NEURO: no focal motor/sensory deficits  LABORATORY DATA:  I have reviewed the data as listed    Latest Ref Rng & Units 07/31/2023    2:16 PM 06/26/2023   12:08 PM 05/29/2023    1:30 PM  CBC  WBC 4.0 - 10.5 K/uL 5.1  6.1  5.5   Hemoglobin 13.0 - 17.0 g/dL 86.5  78.4  69.6   Hematocrit 39.0 - 52.0 % 34.3  31.9  36.5   Platelets 150 - 400 K/uL 765  956  815        Latest Ref Rng & Units 07/31/2023    2:16 PM 06/26/2023   12:08 PM 05/29/2023    1:30 PM  CMP  Glucose  70 - 99 mg/dL 295  284  132   BUN 8 - 23 mg/dL 42  29  37   Creatinine 0.61 - 1.24 mg/dL 4.40  1.02  7.25   Sodium 135 - 145 mmol/L 140  135  137   Potassium 3.5 - 5.1 mmol/L 3.7  4.3  4.6   Chloride 98 - 111 mmol/L 98  104  99   CO2 22 - 32 mmol/L 36  27  34   Calcium 8.9 - 10.3 mg/dL 36.6  9.3  9.4   Total Protein 6.5 - 8.1 g/dL 7.1  6.5  6.7   Total Bilirubin <1.2 mg/dL 0.9  0.6  0.6   Alkaline Phos 38 - 126 U/L 93  95  102   AST 15 - 41 U/L 22  17  21    ALT 0 - 44 U/L 24  20  22      RADIOGRAPHIC STUDIES: No results found.  ASSESSMENT & PLAN CLAYTEN EUGENIO 82 y.o. male with medical history significant for essential thrombocytosis who presents  for a follow up visit.   (1) essential thrombocytosis: Started on Hydrea 1000 mg a day 10/06/2017             (a) Hydrea dose increased to 1500 mg daily beginning 06/27/2019   (2) bone marrow biopsy discussed, postponed   (3) 0.7 cm right upper lung nodule incidentally noted on neck CT scans November 2018             (a) no change on repeat CT of the chest with contrast 02/28/2018             (b) chest x-ray 10/13/2018 obtained for evaluation of a cough shows no mention of a mass  # Essential Thrombocytosis JAK2 Positive.  -- Recommend Mr. Papesh increase his hydroxyurea to 2 pills in the morning and 2 pills in the evening --Labs today show white blood cell count 5.1, Hgb 11.9, MCV 95.3, Plt 765 --Given the poor response and platelets and the lack of an elevation of the MCV I am concerned the patient may not be taking his medication as prescribed. --Patient currently tolerating medication well with no major side effects. --Continue Eliquis 5 mg twice daily.  This will provide thromboprophylaxis, no need for aspirin therapy. --plan for labs in 1 and 2 months time with clinic visit in 3 months.   Orders Placed This Encounter  Procedures   CBC with Differential (Cancer Center Only)    Standing Status:   Standing    Number of Occurrences:    12    Standing Expiration Date:   07/30/2024   CMP (Cancer Center only)    Standing Status:   Standing    Number of Occurrences:   12    Standing Expiration Date:   07/30/2024    All questions were answered. The patient knows to call the clinic with any problems, questions or concerns.  A total of more than 30 minutes were spent on this encounter with face-to-face time and non-face-to-face time, including preparing to see the patient, ordering tests and/or medications, counseling the patient and coordination of care as outlined above.   Ulysees Barns, MD Department of Hematology/Oncology [facility] Phone: [phone] Pager: [phone] Email: [email or web address]  07/31/2023 4:44 PM

## 2023-08-21 DIAGNOSIS — H52203 Unspecified astigmatism, bilateral: Secondary | ICD-10-CM | POA: Diagnosis not present

## 2023-08-21 DIAGNOSIS — E119 Type 2 diabetes mellitus without complications: Secondary | ICD-10-CM | POA: Diagnosis not present

## 2023-08-21 DIAGNOSIS — H524 Presbyopia: Secondary | ICD-10-CM | POA: Diagnosis not present

## 2023-08-21 DIAGNOSIS — E113512 Type 2 diabetes mellitus with proliferative diabetic retinopathy with macular edema, left eye: Secondary | ICD-10-CM | POA: Diagnosis not present

## 2023-08-21 DIAGNOSIS — Z794 Long term (current) use of insulin: Secondary | ICD-10-CM | POA: Diagnosis not present

## 2023-08-21 DIAGNOSIS — Z7984 Long term (current) use of oral hypoglycemic drugs: Secondary | ICD-10-CM | POA: Diagnosis not present

## 2023-08-21 DIAGNOSIS — H35043 Retinal micro-aneurysms, unspecified, bilateral: Secondary | ICD-10-CM | POA: Diagnosis not present

## 2023-08-21 DIAGNOSIS — H5203 Hypermetropia, bilateral: Secondary | ICD-10-CM | POA: Diagnosis not present

## 2023-08-21 DIAGNOSIS — H26493 Other secondary cataract, bilateral: Secondary | ICD-10-CM | POA: Diagnosis not present

## 2023-08-21 DIAGNOSIS — Z961 Presence of intraocular lens: Secondary | ICD-10-CM | POA: Diagnosis not present

## 2023-08-21 DIAGNOSIS — E113551 Type 2 diabetes mellitus with stable proliferative diabetic retinopathy, right eye: Secondary | ICD-10-CM | POA: Diagnosis not present

## 2023-08-21 DIAGNOSIS — Z0100 Encounter for examination of eyes and vision without abnormal findings: Secondary | ICD-10-CM | POA: Diagnosis not present

## 2023-08-28 ENCOUNTER — Other Ambulatory Visit: Payer: Medicare HMO

## 2023-08-28 ENCOUNTER — Telehealth: Payer: Self-pay | Admitting: Hematology and Oncology

## 2023-08-28 NOTE — Telephone Encounter (Signed)
Rescheduled appointment per patients request. Patient is aware of the changes made to his upcoming appointment.

## 2023-08-29 ENCOUNTER — Inpatient Hospital Stay: Payer: Medicare HMO | Attending: Hematology and Oncology

## 2023-08-29 DIAGNOSIS — D473 Essential (hemorrhagic) thrombocythemia: Secondary | ICD-10-CM | POA: Insufficient documentation

## 2023-08-29 LAB — CMP (CANCER CENTER ONLY)
ALT: 17 U/L (ref 0–44)
AST: 16 U/L (ref 15–41)
Albumin: 3.5 g/dL (ref 3.5–5.0)
Alkaline Phosphatase: 98 U/L (ref 38–126)
Anion gap: 5 (ref 5–15)
BUN: 30 mg/dL — ABNORMAL HIGH (ref 8–23)
CO2: 31 mmol/L (ref 22–32)
Calcium: 9.3 mg/dL (ref 8.9–10.3)
Chloride: 104 mmol/L (ref 98–111)
Creatinine: 1.37 mg/dL — ABNORMAL HIGH (ref 0.61–1.24)
GFR, Estimated: 52 mL/min — ABNORMAL LOW (ref >60)
Glucose, Bld: 246 mg/dL — ABNORMAL HIGH (ref 70–99)
Potassium: 4.6 mmol/L (ref 3.5–5.1)
Sodium: 140 mmol/L (ref 135–145)
Total Bilirubin: 0.6 mg/dL (ref <1.2)
Total Protein: 6.2 g/dL — ABNORMAL LOW (ref 6.5–8.1)

## 2023-08-29 LAB — CBC WITH DIFFERENTIAL (CANCER CENTER ONLY)
Abs Immature Granulocytes: 0.01 10*3/uL (ref 0.00–0.07)
Basophils Absolute: 0 10*3/uL (ref 0.0–0.1)
Basophils Relative: 1 %
Eosinophils Absolute: 0 10*3/uL (ref 0.0–0.5)
Eosinophils Relative: 1 %
HCT: 27.7 % — ABNORMAL LOW (ref 39.0–52.0)
Hemoglobin: 9.6 g/dL — ABNORMAL LOW (ref 13.0–17.0)
Immature Granulocytes: 0 %
Lymphocytes Relative: 15 %
Lymphs Abs: 0.5 10*3/uL — ABNORMAL LOW (ref 0.7–4.0)
MCH: 35.7 pg — ABNORMAL HIGH (ref 26.0–34.0)
MCHC: 34.7 g/dL (ref 30.0–36.0)
MCV: 103 fL — ABNORMAL HIGH (ref 80.0–100.0)
Monocytes Absolute: 0.1 10*3/uL (ref 0.1–1.0)
Monocytes Relative: 4 %
Neutro Abs: 2.6 10*3/uL (ref 1.7–7.7)
Neutrophils Relative %: 79 %
Platelet Count: 456 10*3/uL — ABNORMAL HIGH (ref 150–400)
RBC: 2.69 MIL/uL — ABNORMAL LOW (ref 4.22–5.81)
RDW: 24.9 % — ABNORMAL HIGH (ref 11.5–15.5)
WBC Count: 3.3 10*3/uL — ABNORMAL LOW (ref 4.0–10.5)
nRBC: 0 % (ref 0.0–0.2)

## 2023-08-30 ENCOUNTER — Other Ambulatory Visit: Payer: Self-pay | Admitting: Student

## 2023-08-30 DIAGNOSIS — I480 Paroxysmal atrial fibrillation: Secondary | ICD-10-CM

## 2023-08-31 NOTE — Telephone Encounter (Signed)
Eliquis 5mg  refill request received. Patient is 82 years old, weight-118.3kg, Crea-1.37 on 08/29/23, Diagnosis-Afib, and last seen by Dr. Shari Prows on 03/07/23 (recall for Dr. Anne Fu). Dose is appropriate based on dosing criteria. Will send in refill to requested pharmacy.

## 2023-09-11 ENCOUNTER — Telehealth: Payer: Self-pay | Admitting: Cardiology

## 2023-09-11 NOTE — Telephone Encounter (Signed)
Pt c/o medication issue:  1. Name of Medication:   apixaban (ELIQUIS) 5 MG TABS tablet   2. How are you currently taking this medication (dosage and times per day)?   Taking 1 tablet once daily  3. Are you having a reaction (difficulty breathing--STAT)?  No  4. What is your medication issue?   Patient stated this medication is too expensive and wants to get alternate medication.  Patient stated he has currently reduced the dosage he is taking.

## 2023-09-19 ENCOUNTER — Other Ambulatory Visit (HOSPITAL_COMMUNITY): Payer: Self-pay

## 2023-09-20 DIAGNOSIS — M25552 Pain in left hip: Secondary | ICD-10-CM | POA: Diagnosis not present

## 2023-09-25 ENCOUNTER — Other Ambulatory Visit (HOSPITAL_COMMUNITY): Payer: Self-pay

## 2023-09-25 ENCOUNTER — Inpatient Hospital Stay: Payer: Medicare HMO

## 2023-09-25 NOTE — Telephone Encounter (Signed)
 Patient picked up his Eliquis on 09/11/23

## 2023-09-26 ENCOUNTER — Other Ambulatory Visit: Payer: Self-pay | Admitting: Physician Assistant

## 2023-09-27 ENCOUNTER — Other Ambulatory Visit: Payer: Self-pay

## 2023-09-27 MED ORDER — TORSEMIDE 20 MG PO TABS: 60.0000 mg | ORAL_TABLET | Freq: Every day | ORAL | 0 refills | Status: DC

## 2023-10-04 DIAGNOSIS — L817 Pigmented purpuric dermatosis: Secondary | ICD-10-CM | POA: Diagnosis not present

## 2023-10-04 DIAGNOSIS — C4442 Squamous cell carcinoma of skin of scalp and neck: Secondary | ICD-10-CM | POA: Diagnosis not present

## 2023-10-04 DIAGNOSIS — D485 Neoplasm of uncertain behavior of skin: Secondary | ICD-10-CM | POA: Diagnosis not present

## 2023-10-04 DIAGNOSIS — D1801 Hemangioma of skin and subcutaneous tissue: Secondary | ICD-10-CM | POA: Diagnosis not present

## 2023-10-04 DIAGNOSIS — L570 Actinic keratosis: Secondary | ICD-10-CM | POA: Diagnosis not present

## 2023-10-06 DIAGNOSIS — Z743 Need for continuous supervision: Secondary | ICD-10-CM | POA: Diagnosis not present

## 2023-10-06 DIAGNOSIS — W19XXXA Unspecified fall, initial encounter: Secondary | ICD-10-CM | POA: Diagnosis not present

## 2023-10-06 DIAGNOSIS — R55 Syncope and collapse: Secondary | ICD-10-CM | POA: Diagnosis not present

## 2023-10-06 DIAGNOSIS — D72819 Decreased white blood cell count, unspecified: Secondary | ICD-10-CM | POA: Diagnosis not present

## 2023-10-06 DIAGNOSIS — Z136 Encounter for screening for cardiovascular disorders: Secondary | ICD-10-CM | POA: Diagnosis not present

## 2023-10-06 DIAGNOSIS — S0191XA Laceration without foreign body of unspecified part of head, initial encounter: Secondary | ICD-10-CM | POA: Diagnosis not present

## 2023-10-06 DIAGNOSIS — Z7901 Long term (current) use of anticoagulants: Secondary | ICD-10-CM | POA: Diagnosis not present

## 2023-10-06 DIAGNOSIS — I4891 Unspecified atrial fibrillation: Secondary | ICD-10-CM | POA: Diagnosis not present

## 2023-10-06 DIAGNOSIS — I672 Cerebral atherosclerosis: Secondary | ICD-10-CM | POA: Diagnosis not present

## 2023-10-06 DIAGNOSIS — Y92091 Bathroom in other non-institutional residence as the place of occurrence of the external cause: Secondary | ICD-10-CM | POA: Diagnosis not present

## 2023-10-06 DIAGNOSIS — S0101XA Laceration without foreign body of scalp, initial encounter: Secondary | ICD-10-CM | POA: Diagnosis not present

## 2023-10-06 DIAGNOSIS — I499 Cardiac arrhythmia, unspecified: Secondary | ICD-10-CM | POA: Diagnosis not present

## 2023-10-06 DIAGNOSIS — S01112A Laceration without foreign body of left eyelid and periocular area, initial encounter: Secondary | ICD-10-CM | POA: Diagnosis not present

## 2023-10-06 DIAGNOSIS — E162 Hypoglycemia, unspecified: Secondary | ICD-10-CM | POA: Diagnosis not present

## 2023-10-06 DIAGNOSIS — E161 Other hypoglycemia: Secondary | ICD-10-CM | POA: Diagnosis not present

## 2023-10-06 DIAGNOSIS — R42 Dizziness and giddiness: Secondary | ICD-10-CM | POA: Diagnosis not present

## 2023-10-06 DIAGNOSIS — Y939 Activity, unspecified: Secondary | ICD-10-CM | POA: Diagnosis not present

## 2023-10-10 ENCOUNTER — Ambulatory Visit: Payer: Medicare HMO | Attending: Cardiology | Admitting: Cardiology

## 2023-10-10 ENCOUNTER — Encounter: Payer: Self-pay | Admitting: Cardiology

## 2023-10-10 VITALS — BP 110/60 | HR 88 | Ht 72.0 in | Wt 254.4 lb

## 2023-10-10 DIAGNOSIS — I10 Essential (primary) hypertension: Secondary | ICD-10-CM | POA: Diagnosis not present

## 2023-10-10 DIAGNOSIS — I4819 Other persistent atrial fibrillation: Secondary | ICD-10-CM

## 2023-10-10 DIAGNOSIS — I5032 Chronic diastolic (congestive) heart failure: Secondary | ICD-10-CM

## 2023-10-10 DIAGNOSIS — G4733 Obstructive sleep apnea (adult) (pediatric): Secondary | ICD-10-CM | POA: Diagnosis not present

## 2023-10-10 NOTE — Patient Instructions (Signed)
Medication Instructions:  The current medical regimen is effective;  continue present plan and medications.  *If you need a refill on your cardiac medications before your next appointment, please call your pharmacy*   Follow-Up: At [facility], you and your health needs are our priority.  As part of our continuing mission to provide you with exceptional heart care, we have created designated Provider Care Teams.  These Care Teams include your primary Cardiologist (physician) and Advanced Practice Providers (APPs -  Physician Assistants and Nurse Practitioners) who all work together to provide you with the care you need, when you need it.  We recommend signing up for the patient portal called "MyChart".  Sign up information is provided on this After Visit Summary.  MyChart is used to connect with patients for Virtual Visits (Telemedicine).  Patients are able to view lab/test results, encounter notes, upcoming appointments, etc.  Non-urgent messages can be sent to your provider as well.   To learn more about what you can do with MyChart, go to [email or web address].    Your next appointment:   1 year(s)  Provider:   Tereso Newcomer, PA-C

## 2023-10-10 NOTE — Progress Notes (Signed)
Cardiology Office Note:  .   Date:  10/10/2023  ID:  [name], DOB 1941/03/29, MRN [id number] PCP: Adrian Prince, MD  [facility] Providers Cardiologist:  Lesleigh Noe, MD (Inactive) Electrophysiologist:  Hillis Range, MD (Inactive)     History of Present Illness: .   [name] is a 83 y.o. male Discussed with the use of AI scribe  History of Present Illness   The patient is an 83 year old male with paroxysmal atrial fibrillation, chronic diastolic heart failure, and prior stroke who presents for follow-up. He is accompanied by his son, Trey Paula.  He has a history of paroxysmal atrial fibrillation and is currently on Eliquis 5 mg twice a day for anticoagulation and metoprolol tartrate 50 mg twice a day. A monitor in 2020 showed paroxysmal atrial flutter or fibrillation with a burden of 34% and a maximum heart rate of 140 beats per minute, with a post-conversion pause of 3.4 seconds. His CHADS-VASc score is 5. His EKG today shows stable atrial fibrillation/flutter.  He has chronic diastolic heart failure, currently compensated, with NYHA class II symptoms. He is on metolazone 2.5 mg twice a week and torsemide 60 mg daily. He experiences chronic lower extremity edema but has no chest pain or shortness of breath. He tolerates his medications well.  He has a history of a prior stroke and is on Eliquis to help prevent TIAs and strokes. He has had a couple of scares in the past.  He has hypertension, managed with benazepril 40 mg daily and metoprolol 50 mg twice a day. His blood pressure is reported as excellent.  He has hyperlipidemia and is on atorvastatin 80 mg daily. His LDL is 33 as of April 27, 2023.  He has obstructive sleep apnea but is not using CPAP. 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  He has morbid obesity  and peripheral arterial disease. He reports a recent fall in the bathroom resulting in 11 staples in the back of his head. He denies slipping on ice and attributes the fall to a loss of balance. A CT scan was negative for any acute injury.  His hemoglobin A1c is 7.5, hemoglobin is 9.6, creatinine is 1.37, and TSH is 3.0 as of August 29, 2023.          ROS: Recent fall, dizziness, neuropathy  Studies Reviewed: [name] Kitchen   EKG Interpretation Date/Time:  Tuesday October 10 2023 14:00:03 EST Ventricular Rate:  88 PR Interval:  174 QRS Duration:  74 QT Interval:  388 QTC Calculation: 469 R Axis:   -5  Text Interpretation: Atrial fibrillation Poor R wave progression When compared with ECG of 28-Aug-2022 09:40, No significant change since last tracing Confirmed by Donato Schultz ([id number]) on 10/10/2023 2:12:49 PM    Results   LABS LDL: 33 (04/27/2023) HbA1c: 7.5 (04/27/2023) Hb: 9.6 (04/27/2023) Cr: 1.37 (04/27/2023) TSH: 3.0 (04/27/2023)  RADIOLOGY Head CT: Negative (10/06/2023)  DIAGNOSTIC Echocardiogram: Ejection fraction 65%, normal RV, no significant valvular disease (2022) Nuclear stress test: Low risk, no ischemia, ejection fraction 56% (08/23/2018) Cardiac monitor: Paroxysmal atrial flutter or paroxysmal atrial fibrillation, burden 34%, fastest heart rate 140 bpm, post conversion pause 3.4 seconds (2020) EKG: Atrial fibrillation, stable (10/10/2023)     Risk Assessment/Calculations:            Physical Exam:  VS:  BP 110/60   Pulse 88   Ht 6' (1.829 m)   Wt 254 lb 6.4 oz (115.4 kg)   SpO2 99%   BMI 34.50 kg/m    Wt Readings from Last 3 Encounters:  10/10/23 254 lb 6.4 oz (115.4 kg)  07/31/23 260 lb 14.4 oz (118.3 kg)  05/04/23 266 lb 3.2 oz (120.7 kg)    GEN: Well nourished, well developed in no acute distress, somewhat pale.  Staples noted on head. NECK: No JVD; No carotid bruits CARDIAC: Irreg, no murmurs, no rubs, no gallops RESPIRATORY:  Clear to auscultation  without rales, wheezing or rhonchi  ABDOMEN: Soft, non-tender, non-distended EXTREMITIES: Trace bilateral edema; No deformity   ASSESSMENT AND PLAN: .    Assessment and Plan    Paroxysmal Atrial Fibrillation Paroxysmal atrial fibrillation with a CHADS-VASc score of 5. Currently on Eliquis 5 mg BID and metoprolol tartrate 50 mg BID. EKG shows atrial fibrillation/flutter. Recent fall with negative CT. Decision to continue Eliquis due to high stroke risk. Discussed bleeding risks versus stroke prevention, emphasizing guideline recommendations. - Continue Eliquis 5 mg BID - Continue metoprolol tartrate 50 mg BID  Chronic Diastolic Heart Failure Compensated chronic diastolic heart failure, NYHA class II. On metolazone 2.5 mg twice weekly and torsemide 60 mg daily. No chest pain or dyspnea. Tolerating medications well. - Continue metolazone 2.5 mg twice weekly - Continue torsemide 60 mg daily  Hypertension Well-controlled hypertension on benazepril 40 mg daily and metoprolol 50 mg BID. Excellent BP readings. - Continue benazepril 40 mg daily - Continue metoprolol 50 mg BID  Peripheral Arterial Disease Peripheral arterial disease. No new symptoms or changes. - Monitor for new symptoms or changes  Hyperlipidemia Hyperlipidemia on atorvastatin 80 mg daily. Recent LDL is 33, indicating good control. - Continue atorvastatin 80 mg daily  Obstructive Sleep Apnea Obstructive sleep apnea not using CPAP. Discussed potential benefits of CPAP. Patient declined CPAP therapy. - Reassess willingness to use CPAP in future visits  Morbid Obesity Morbid obesity contributing to balance problems and fatigue. No specific interventions discussed. - Monitor weight and discuss potential interventions in future visits  Mild Anemia Mild anemia with hemoglobin of 9.6, being monitored. No changes in management necessary. - Continue monitoring hemoglobin levels  Follow-up  - Return to cardiology to see APP  in one year unless symptoms change.               Signed, Donato Schultz, MD

## 2023-10-13 DIAGNOSIS — H4312 Vitreous hemorrhage, left eye: Secondary | ICD-10-CM | POA: Diagnosis not present

## 2023-10-13 DIAGNOSIS — T451X1A Poisoning by antineoplastic and immunosuppressive drugs, accidental (unintentional), initial encounter: Secondary | ICD-10-CM | POA: Diagnosis not present

## 2023-10-13 DIAGNOSIS — R918 Other nonspecific abnormal finding of lung field: Secondary | ICD-10-CM | POA: Diagnosis not present

## 2023-10-13 DIAGNOSIS — J189 Pneumonia, unspecified organism: Secondary | ICD-10-CM | POA: Diagnosis not present

## 2023-10-13 DIAGNOSIS — D75838 Other thrombocytosis: Secondary | ICD-10-CM | POA: Diagnosis not present

## 2023-10-13 DIAGNOSIS — D6481 Anemia due to antineoplastic chemotherapy: Secondary | ICD-10-CM | POA: Diagnosis not present

## 2023-10-13 DIAGNOSIS — L97311 Non-pressure chronic ulcer of right ankle limited to breakdown of skin: Secondary | ICD-10-CM | POA: Diagnosis not present

## 2023-10-13 DIAGNOSIS — I4891 Unspecified atrial fibrillation: Secondary | ICD-10-CM | POA: Diagnosis not present

## 2023-10-13 DIAGNOSIS — R42 Dizziness and giddiness: Secondary | ICD-10-CM | POA: Diagnosis not present

## 2023-10-13 DIAGNOSIS — D709 Neutropenia, unspecified: Secondary | ICD-10-CM | POA: Diagnosis not present

## 2023-10-13 DIAGNOSIS — A419 Sepsis, unspecified organism: Secondary | ICD-10-CM | POA: Diagnosis not present

## 2023-10-13 DIAGNOSIS — E1122 Type 2 diabetes mellitus with diabetic chronic kidney disease: Secondary | ICD-10-CM | POA: Diagnosis not present

## 2023-10-13 DIAGNOSIS — D473 Essential (hemorrhagic) thrombocythemia: Secondary | ICD-10-CM | POA: Diagnosis not present

## 2023-10-13 DIAGNOSIS — R600 Localized edema: Secondary | ICD-10-CM | POA: Diagnosis not present

## 2023-10-13 DIAGNOSIS — Z7984 Long term (current) use of oral hypoglycemic drugs: Secondary | ICD-10-CM | POA: Diagnosis not present

## 2023-10-13 DIAGNOSIS — G319 Degenerative disease of nervous system, unspecified: Secondary | ICD-10-CM | POA: Diagnosis not present

## 2023-10-13 DIAGNOSIS — J168 Pneumonia due to other specified infectious organisms: Secondary | ICD-10-CM | POA: Diagnosis not present

## 2023-10-13 DIAGNOSIS — F329 Major depressive disorder, single episode, unspecified: Secondary | ICD-10-CM | POA: Diagnosis not present

## 2023-10-13 DIAGNOSIS — I130 Hypertensive heart and chronic kidney disease with heart failure and stage 1 through stage 4 chronic kidney disease, or unspecified chronic kidney disease: Secondary | ICD-10-CM | POA: Diagnosis not present

## 2023-10-13 DIAGNOSIS — R4182 Altered mental status, unspecified: Secondary | ICD-10-CM | POA: Diagnosis not present

## 2023-10-13 DIAGNOSIS — I679 Cerebrovascular disease, unspecified: Secondary | ICD-10-CM | POA: Diagnosis not present

## 2023-10-13 DIAGNOSIS — E113512 Type 2 diabetes mellitus with proliferative diabetic retinopathy with macular edema, left eye: Secondary | ICD-10-CM | POA: Diagnosis not present

## 2023-10-13 DIAGNOSIS — T451X1D Poisoning by antineoplastic and immunosuppressive drugs, accidental (unintentional), subsequent encounter: Secondary | ICD-10-CM | POA: Diagnosis not present

## 2023-10-13 DIAGNOSIS — E1142 Type 2 diabetes mellitus with diabetic polyneuropathy: Secondary | ICD-10-CM | POA: Diagnosis not present

## 2023-10-13 DIAGNOSIS — F0393 Unspecified dementia, unspecified severity, with mood disturbance: Secondary | ICD-10-CM | POA: Diagnosis not present

## 2023-10-13 DIAGNOSIS — R410 Disorientation, unspecified: Secondary | ICD-10-CM | POA: Diagnosis not present

## 2023-10-13 DIAGNOSIS — I6782 Cerebral ischemia: Secondary | ICD-10-CM | POA: Diagnosis not present

## 2023-10-13 DIAGNOSIS — Z743 Need for continuous supervision: Secondary | ICD-10-CM | POA: Diagnosis not present

## 2023-10-13 DIAGNOSIS — Z96651 Presence of right artificial knee joint: Secondary | ICD-10-CM | POA: Diagnosis not present

## 2023-10-13 DIAGNOSIS — K5900 Constipation, unspecified: Secondary | ICD-10-CM | POA: Diagnosis not present

## 2023-10-13 DIAGNOSIS — R400 Somnolence: Secondary | ICD-10-CM | POA: Diagnosis not present

## 2023-10-13 DIAGNOSIS — I5032 Chronic diastolic (congestive) heart failure: Secondary | ICD-10-CM | POA: Diagnosis not present

## 2023-10-13 DIAGNOSIS — R062 Wheezing: Secondary | ICD-10-CM | POA: Diagnosis not present

## 2023-10-13 DIAGNOSIS — R339 Retention of urine, unspecified: Secondary | ICD-10-CM | POA: Diagnosis not present

## 2023-10-13 DIAGNOSIS — R110 Nausea: Secondary | ICD-10-CM | POA: Diagnosis not present

## 2023-10-13 DIAGNOSIS — T451X5D Adverse effect of antineoplastic and immunosuppressive drugs, subsequent encounter: Secondary | ICD-10-CM | POA: Diagnosis not present

## 2023-10-13 DIAGNOSIS — R739 Hyperglycemia, unspecified: Secondary | ICD-10-CM | POA: Diagnosis not present

## 2023-10-13 DIAGNOSIS — E039 Hypothyroidism, unspecified: Secondary | ICD-10-CM | POA: Diagnosis not present

## 2023-10-13 DIAGNOSIS — E1140 Type 2 diabetes mellitus with diabetic neuropathy, unspecified: Secondary | ICD-10-CM | POA: Diagnosis not present

## 2023-10-13 DIAGNOSIS — Z85828 Personal history of other malignant neoplasm of skin: Secondary | ICD-10-CM | POA: Diagnosis not present

## 2023-10-13 DIAGNOSIS — R531 Weakness: Secondary | ICD-10-CM | POA: Diagnosis not present

## 2023-10-13 DIAGNOSIS — I959 Hypotension, unspecified: Secondary | ICD-10-CM | POA: Diagnosis not present

## 2023-10-13 DIAGNOSIS — Z8673 Personal history of transient ischemic attack (TIA), and cerebral infarction without residual deficits: Secondary | ICD-10-CM | POA: Diagnosis not present

## 2023-10-13 DIAGNOSIS — Z794 Long term (current) use of insulin: Secondary | ICD-10-CM | POA: Diagnosis not present

## 2023-10-13 DIAGNOSIS — I700 Atherosclerosis of aorta: Secondary | ICD-10-CM | POA: Diagnosis not present

## 2023-10-13 DIAGNOSIS — K5904 Chronic idiopathic constipation: Secondary | ICD-10-CM | POA: Diagnosis not present

## 2023-10-13 DIAGNOSIS — E7800 Pure hypercholesterolemia, unspecified: Secondary | ICD-10-CM | POA: Diagnosis not present

## 2023-10-13 DIAGNOSIS — T887XXA Unspecified adverse effect of drug or medicament, initial encounter: Secondary | ICD-10-CM | POA: Diagnosis not present

## 2023-10-13 DIAGNOSIS — I480 Paroxysmal atrial fibrillation: Secondary | ICD-10-CM | POA: Diagnosis not present

## 2023-10-13 DIAGNOSIS — E113513 Type 2 diabetes mellitus with proliferative diabetic retinopathy with macular edema, bilateral: Secondary | ICD-10-CM | POA: Diagnosis not present

## 2023-10-13 DIAGNOSIS — I6523 Occlusion and stenosis of bilateral carotid arteries: Secondary | ICD-10-CM | POA: Diagnosis not present

## 2023-10-13 DIAGNOSIS — G4733 Obstructive sleep apnea (adult) (pediatric): Secondary | ICD-10-CM | POA: Diagnosis not present

## 2023-10-13 DIAGNOSIS — M25471 Effusion, right ankle: Secondary | ICD-10-CM | POA: Diagnosis not present

## 2023-10-13 DIAGNOSIS — D701 Agranulocytosis secondary to cancer chemotherapy: Secondary | ICD-10-CM | POA: Diagnosis not present

## 2023-10-13 DIAGNOSIS — F0394 Unspecified dementia, unspecified severity, with anxiety: Secondary | ICD-10-CM | POA: Diagnosis not present

## 2023-10-13 DIAGNOSIS — N400 Enlarged prostate without lower urinary tract symptoms: Secondary | ICD-10-CM | POA: Diagnosis not present

## 2023-10-13 DIAGNOSIS — E119 Type 2 diabetes mellitus without complications: Secondary | ICD-10-CM | POA: Diagnosis not present

## 2023-10-13 DIAGNOSIS — J9811 Atelectasis: Secondary | ICD-10-CM | POA: Diagnosis not present

## 2023-10-13 DIAGNOSIS — N1831 Chronic kidney disease, stage 3a: Secondary | ICD-10-CM | POA: Diagnosis not present

## 2023-10-16 ENCOUNTER — Telehealth: Payer: Self-pay | Admitting: Hematology and Oncology

## 2023-10-16 NOTE — Telephone Encounter (Signed)
Scheduled appointments per 2/3 scheduling message. Left the patients wife a message with the newly scheduled dates and times. The patient is active on MyChart and will be mailed appointment reminders.

## 2023-10-18 ENCOUNTER — Encounter: Payer: Self-pay | Admitting: *Deleted

## 2023-10-18 ENCOUNTER — Telehealth: Payer: Self-pay | Admitting: *Deleted

## 2023-10-18 DIAGNOSIS — N1831 Chronic kidney disease, stage 3a: Secondary | ICD-10-CM | POA: Diagnosis not present

## 2023-10-18 DIAGNOSIS — I951 Orthostatic hypotension: Secondary | ICD-10-CM | POA: Diagnosis not present

## 2023-10-18 DIAGNOSIS — H4312 Vitreous hemorrhage, left eye: Secondary | ICD-10-CM | POA: Diagnosis not present

## 2023-10-18 DIAGNOSIS — T451X5D Adverse effect of antineoplastic and immunosuppressive drugs, subsequent encounter: Secondary | ICD-10-CM | POA: Diagnosis not present

## 2023-10-18 DIAGNOSIS — Z85828 Personal history of other malignant neoplasm of skin: Secondary | ICD-10-CM | POA: Diagnosis not present

## 2023-10-18 DIAGNOSIS — I480 Paroxysmal atrial fibrillation: Secondary | ICD-10-CM | POA: Diagnosis not present

## 2023-10-18 DIAGNOSIS — D701 Agranulocytosis secondary to cancer chemotherapy: Secondary | ICD-10-CM | POA: Diagnosis not present

## 2023-10-18 DIAGNOSIS — D75838 Other thrombocytosis: Secondary | ICD-10-CM | POA: Diagnosis not present

## 2023-10-18 DIAGNOSIS — E113512 Type 2 diabetes mellitus with proliferative diabetic retinopathy with macular edema, left eye: Secondary | ICD-10-CM | POA: Diagnosis not present

## 2023-10-18 DIAGNOSIS — I679 Cerebrovascular disease, unspecified: Secondary | ICD-10-CM | POA: Diagnosis not present

## 2023-10-18 DIAGNOSIS — I959 Hypotension, unspecified: Secondary | ICD-10-CM | POA: Diagnosis not present

## 2023-10-18 DIAGNOSIS — E1140 Type 2 diabetes mellitus with diabetic neuropathy, unspecified: Secondary | ICD-10-CM | POA: Diagnosis not present

## 2023-10-18 DIAGNOSIS — E119 Type 2 diabetes mellitus without complications: Secondary | ICD-10-CM | POA: Diagnosis not present

## 2023-10-18 DIAGNOSIS — J9811 Atelectasis: Secondary | ICD-10-CM | POA: Diagnosis not present

## 2023-10-18 DIAGNOSIS — Z743 Need for continuous supervision: Secondary | ICD-10-CM | POA: Diagnosis not present

## 2023-10-18 DIAGNOSIS — T451X1D Poisoning by antineoplastic and immunosuppressive drugs, accidental (unintentional), subsequent encounter: Secondary | ICD-10-CM | POA: Diagnosis not present

## 2023-10-18 DIAGNOSIS — F0390 Unspecified dementia without behavioral disturbance: Secondary | ICD-10-CM | POA: Diagnosis not present

## 2023-10-18 DIAGNOSIS — R600 Localized edema: Secondary | ICD-10-CM | POA: Diagnosis not present

## 2023-10-18 DIAGNOSIS — R339 Retention of urine, unspecified: Secondary | ICD-10-CM | POA: Diagnosis not present

## 2023-10-18 DIAGNOSIS — Z79899 Other long term (current) drug therapy: Secondary | ICD-10-CM | POA: Diagnosis not present

## 2023-10-18 DIAGNOSIS — K5900 Constipation, unspecified: Secondary | ICD-10-CM | POA: Diagnosis not present

## 2023-10-18 DIAGNOSIS — T887XXA Unspecified adverse effect of drug or medicament, initial encounter: Secondary | ICD-10-CM | POA: Diagnosis not present

## 2023-10-18 DIAGNOSIS — D473 Essential (hemorrhagic) thrombocythemia: Secondary | ICD-10-CM | POA: Diagnosis not present

## 2023-10-18 DIAGNOSIS — M25471 Effusion, right ankle: Secondary | ICD-10-CM | POA: Diagnosis not present

## 2023-10-18 DIAGNOSIS — R062 Wheezing: Secondary | ICD-10-CM | POA: Diagnosis not present

## 2023-10-18 DIAGNOSIS — R110 Nausea: Secondary | ICD-10-CM | POA: Diagnosis not present

## 2023-10-18 DIAGNOSIS — I700 Atherosclerosis of aorta: Secondary | ICD-10-CM | POA: Diagnosis not present

## 2023-10-18 DIAGNOSIS — J189 Pneumonia, unspecified organism: Secondary | ICD-10-CM | POA: Diagnosis not present

## 2023-10-18 DIAGNOSIS — E039 Hypothyroidism, unspecified: Secondary | ICD-10-CM | POA: Diagnosis not present

## 2023-10-18 DIAGNOSIS — I4891 Unspecified atrial fibrillation: Secondary | ICD-10-CM | POA: Diagnosis not present

## 2023-10-18 DIAGNOSIS — L97311 Non-pressure chronic ulcer of right ankle limited to breakdown of skin: Secondary | ICD-10-CM | POA: Diagnosis not present

## 2023-10-18 DIAGNOSIS — K5904 Chronic idiopathic constipation: Secondary | ICD-10-CM | POA: Diagnosis not present

## 2023-10-18 DIAGNOSIS — I6523 Occlusion and stenosis of bilateral carotid arteries: Secondary | ICD-10-CM | POA: Diagnosis not present

## 2023-10-18 NOTE — Telephone Encounter (Signed)
 Received vm message from pt's son, Kadin Canipe.  He states his father is being discharged from [facility] hospital today to go to rehab. Chyrl is asking about this father's Hydroxyurea . It has been on hold since pt's admission. Last  HGB today was 8.3 and platelet count of 451K Advised that I would consult with Dr. Federico and call him back. Advised that his dad will need weekly labs and then f/u appt with Dr. Federico on 11/06/23 Chyrl voiced understanding. Asked him  to find out if the rehab center would be doing weekly CBC's. He said he would find out and let us  know. Dr. Federico recommends holding Hydroxyurea  until HGB  10 or higher. Son made aware of this.

## 2023-10-23 ENCOUNTER — Other Ambulatory Visit: Payer: Medicare HMO

## 2023-10-23 DIAGNOSIS — I480 Paroxysmal atrial fibrillation: Secondary | ICD-10-CM | POA: Diagnosis not present

## 2023-10-23 DIAGNOSIS — I679 Cerebrovascular disease, unspecified: Secondary | ICD-10-CM | POA: Diagnosis not present

## 2023-10-23 DIAGNOSIS — F0390 Unspecified dementia without behavioral disturbance: Secondary | ICD-10-CM | POA: Diagnosis not present

## 2023-10-23 DIAGNOSIS — E119 Type 2 diabetes mellitus without complications: Secondary | ICD-10-CM | POA: Diagnosis not present

## 2023-10-24 ENCOUNTER — Encounter: Payer: Self-pay | Admitting: Hematology and Oncology

## 2023-10-24 ENCOUNTER — Inpatient Hospital Stay (HOSPITAL_BASED_OUTPATIENT_CLINIC_OR_DEPARTMENT_OTHER): Payer: Medicare HMO | Admitting: Hematology and Oncology

## 2023-10-24 ENCOUNTER — Other Ambulatory Visit: Payer: Self-pay | Admitting: *Deleted

## 2023-10-24 ENCOUNTER — Inpatient Hospital Stay: Payer: Medicare HMO | Attending: Hematology and Oncology

## 2023-10-24 VITALS — BP 138/56 | HR 40 | Temp 97.3°F | Resp 13 | Wt 247.9 lb

## 2023-10-24 DIAGNOSIS — Z85828 Personal history of other malignant neoplasm of skin: Secondary | ICD-10-CM | POA: Diagnosis not present

## 2023-10-24 DIAGNOSIS — D473 Essential (hemorrhagic) thrombocythemia: Secondary | ICD-10-CM | POA: Insufficient documentation

## 2023-10-24 DIAGNOSIS — Z79899 Other long term (current) drug therapy: Secondary | ICD-10-CM | POA: Insufficient documentation

## 2023-10-24 LAB — CMP (CANCER CENTER ONLY)
ALT: 20 U/L (ref 0–44)
AST: 20 U/L (ref 15–41)
Albumin: 3.4 g/dL — ABNORMAL LOW (ref 3.5–5.0)
Alkaline Phosphatase: 120 U/L (ref 38–126)
Anion gap: 8 (ref 5–15)
BUN: 32 mg/dL — ABNORMAL HIGH (ref 8–23)
CO2: 34 mmol/L — ABNORMAL HIGH (ref 22–32)
Calcium: 9.1 mg/dL (ref 8.9–10.3)
Chloride: 96 mmol/L — ABNORMAL LOW (ref 98–111)
Creatinine: 1.48 mg/dL — ABNORMAL HIGH (ref 0.61–1.24)
GFR, Estimated: 48 mL/min — ABNORMAL LOW (ref >60)
Glucose, Bld: 225 mg/dL — ABNORMAL HIGH (ref 70–99)
Potassium: 3.9 mmol/L (ref 3.5–5.1)
Sodium: 138 mmol/L (ref 135–145)
Total Bilirubin: 0.8 mg/dL (ref 0.0–1.2)
Total Protein: 6.7 g/dL (ref 6.5–8.1)

## 2023-10-24 LAB — CBC WITH DIFFERENTIAL (CANCER CENTER ONLY)
Abs Immature Granulocytes: 0.02 10*3/uL (ref 0.00–0.07)
Basophils Absolute: 0 10*3/uL (ref 0.0–0.1)
Basophils Relative: 1 %
Eosinophils Absolute: 0 10*3/uL (ref 0.0–0.5)
Eosinophils Relative: 1 %
HCT: 26.9 % — ABNORMAL LOW (ref 39.0–52.0)
Hemoglobin: 9.6 g/dL — ABNORMAL LOW (ref 13.0–17.0)
Immature Granulocytes: 1 %
Lymphocytes Relative: 16 %
Lymphs Abs: 0.6 10*3/uL — ABNORMAL LOW (ref 0.7–4.0)
MCH: 41.4 pg — ABNORMAL HIGH (ref 26.0–34.0)
MCHC: 35.7 g/dL (ref 30.0–36.0)
MCV: 115.9 fL — ABNORMAL HIGH (ref 80.0–100.0)
Monocytes Absolute: 0.5 10*3/uL (ref 0.1–1.0)
Monocytes Relative: 13 %
Neutro Abs: 2.4 10*3/uL (ref 1.7–7.7)
Neutrophils Relative %: 68 %
Platelet Count: 671 10*3/uL — ABNORMAL HIGH (ref 150–400)
RBC: 2.32 MIL/uL — ABNORMAL LOW (ref 4.22–5.81)
RDW: 24.2 % — ABNORMAL HIGH (ref 11.5–15.5)
WBC Count: 3.5 10*3/uL — ABNORMAL LOW (ref 4.0–10.5)
nRBC: 0 % (ref 0.0–0.2)

## 2023-10-24 MED ORDER — ONDANSETRON HCL 8 MG PO TABS
8.0000 mg | ORAL_TABLET | Freq: Once | ORAL | Status: AC
  Administered 2023-10-24: 8 mg via ORAL
  Filled 2023-10-24: qty 1

## 2023-10-24 NOTE — Progress Notes (Signed)
[facility] [phone]   [phone]  PROGRESS NOTE  Patient Care Team: Adrian Prince, MD as PCP - General (Endocrinology) Lyn Records, MD (Inactive) as PCP - Cardiology (Cardiology) Hillis Range, MD (Inactive) as PCP - Electrophysiology (Cardiology) Hilarie Fredrickson, MD as Consulting Physician (Gastroenterology) Janalyn Harder, MD (Inactive) as Consulting Physician (Dermatology) Lovenia Shuck, MD as Referring Physician (Ophthalmology) Micki Riley, MD as Consulting Physician (Neurology) Jaci Standard, MD as Consulting Physician (Hematology and Oncology)  Hematological/Oncological History # 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 Darnelle Catalan. 08/08/2022: Establish care with Dr. Leonides Schanz 02/01/2023: Plt 1233, patient stopped medication x several weeks. Restarted on hydroxyurea 500 mg BID.  05/04/2023: WBC 8.3, Hgb 12.4, MCV 89.4, Plt 1082, increased hydroxyurea to 500 in the morning and 1000 at night. 10/06/2023: WBC 1.48, Hgb 9.0, Plt 333, MCV 119.7. Hydroxyurea held due to severe cytopenias.   10/24/2023: WBC 3.5, Hgb 9.6, MCV 115.9, Plt 671  Interval History:  [name] 83 y.o. male with medical history significant for essential thrombocytosis who presents for a follow up visit. The patient's last visit was on 07/31/2023. In the interim since the last visit he has had no major changes in his health.  On exam today [name] reports he has been off hydroxyurea since his ER visit on 10/06/2023.  He is also been living in a rehab facility.  He notes unfortunately did have an episode of nausea and vomiting on the bus ride over as he got carsick.  He reports that otherwise he has been pretty well at the facility.  He notes he is doing his best to try to eat well.  He notes he is had no recent infections such as runny nose, sore throat, or cough.  He notes he does have a  small sore on the right side of his foot which developed when he was wearing new shoes at a wedding.  He notes he is not having any trouble with bleeding, bruising, or dark stools.  Overall he feels at his baseline level of health with no major recent changes.  Otherwise he does not have any questions concerns or complaints today.  He denies any fevers, chills, sweats, nausea, vomiting or diarrhea.  A full 10 point ROS was otherwise negative.  Today we discussed his pancytopenia and the need to continue holding his hydroxyurea until his hemoglobin and white blood cell count recovered.  Discussed options for treatment moving forward including hydroxyurea or anagrelide.  At this time I favor anagrelide but will make a decision once his counts normalized.    MEDICAL HISTORY:  Past Medical History:  Diagnosis Date   Anxiety    Arthritis    Basal cell carcinoma 07/18/1991   Left nasal brdige (MOHS)   Basal cell carcinoma 01/23/1992   lower right back-(CX35FU)   Basal cell carcinoma 05/20/2003   sup-left back (CX35FU)   Basal cell carcinoma 07/28/2011   post lower neck   Basal cell carcinoma 08/20/2008   right sideburn(MOHS), sup-Left upper back (CX35FU), sup-mid back (CX35FU), nod-Right lower back )CX35FU), nod-right upperarm (CX35FU)   Basal cell carcinoma 06/08/2016   sup-Left upper back (CX35FU), mid back (CX35FU), right lower back (CX35FU), nod-Right upperarm (CX35FU)   Depression    Diabetes mellitus without complication (HCC)    GERD (gastroesophageal reflux disease)    Hyperlipidemia    Hypertension    Neuropathy    Obesity  Paroxysmal atrial fibrillation (HCC)    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   in situ left forearm posterior tx after biopsy    SCCA (squamous cell carcinoma) of skin 01/20/2021   Right Forearm Posterior (in situ)   SCCA (squamous cell carcinoma) of skin 01/20/2021   Left Forearm Posterior (in situ)   SCCA (squamous cell carcinoma) of skin  01/24/2022   Right Temple Sup. (in situ)   Sleep apnea    uses C-pap machine   Squamous cell carcinoma of skin 02/11/2013   in situ-Right temple (CX35FU)   Squamous cell carcinoma of skin 08/20/2008   in situ- front scalp (CX35FU)   Squamous cell carcinoma of skin 06/08/2016   in situ-front scalp (CX35FU)   Squamous cell carcinoma of skin 02/05/2019   in situ-right sideburn-sup (CX35FU), in situ-right sideburn,inf (CX35FU)   Stroke (HCC)    08/09/2017   Superficial basal cell carcinoma (BCC) 01/20/2021   Scalp    SURGICAL HISTORY: Past Surgical History:  Procedure Laterality Date   APPENDECTOMY  1962   BACK SURGERY  00-02-12   x3   BASAL CELL CARCINOMA EXCISION  93/06/10   COLONOSCOPY     KNEE ARTHROSCOPY  005/01/02   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   Procedure: TOTAL LEFT KNEE ARTHROPLASTY;  Surgeon: Ollen Gross, MD;  Location: [facility];  Service: Orthopedics;  Laterality: Left;   TOTAL KNEE ARTHROPLASTY Right 10/08/2018   Procedure: RIGHT TOTAL KNEE ARTHROPLASTY;  Surgeon: Ollen Gross, MD;  Location: [facility];  Service: Orthopedics;  Laterality: Right;     SOCIAL HISTORY: Social History   Socioeconomic History   Marital status: Married    Spouse name: Thurston Hole   Number of children: Not on file   Years of education: Not on file   Highest education level: Not on file  Occupational History   Not on file  Tobacco Use   Smoking status: Former    Current packs/day: 0.00    Average packs/day: 1 pack/day for 30.0 years (30.0 ttl pk-yrs)    Types: Cigars, Cigarettes    Start date: 08/02/1962    Quit date: 08/02/1992    Years since quitting: 31.2   Smokeless tobacco: Never  Vaping Use   Vaping status: Never Used  Substance and Sexual Activity   Alcohol use: Yes    Comment: occasionally   Drug use: No   Sexual activity: Not on file  Other Topics Concern   Not on file  Social History Narrative   Lives w wife   R handed   Caffeine: 1 C of coffee a day    Social Drivers of Corporate investment banker Strain: Not on file  Food Insecurity: No Food Insecurity (08/28/2022)   Hunger Vital Sign    Worried About Running Out of Food in the Last Year: Never true    Ran Out of Food in the Last Year: Never true  Transportation Needs: No Transportation Needs (08/28/2022)   PRAPARE - Administrator, Civil Service (Medical): No    Lack of Transportation (Non-Medical): No  Physical Activity: Not on file  Stress: Not on file  Social Connections: Not on file  Intimate Partner Violence: Not At Risk (08/28/2022)   Humiliation, Afraid, Rape, and Kick questionnaire    Fear of Current or Ex-Partner: No    Emotionally Abused: No    Physically Abused: No    Sexually Abused: No    FAMILY HISTORY: Family History  Problem Relation  Age of Onset   Cancer Mother        unknown   Diabetes Mellitus II Mother    Hypertension Mother    Heart failure Father    CVA Father    Hypertension Sister    Colon cancer Neg Hx    Pancreatic cancer Neg Hx    Esophageal cancer Neg Hx    Stomach cancer Neg Hx     ALLERGIES:  is allergic to penicillin g sodium.  MEDICATIONS:  Current Outpatient Medications  Medication Sig Dispense Refill   acetaminophen (TYLENOL) 325 MG tablet Take 2 tablets (650 mg total) by mouth every 6 (six) hours as needed for mild pain (or Fever >/= 101).     ALPRAZolam (XANAX) 0.5 MG tablet Take 0.5 mg by mouth 2 (two) times daily.     apixaban (ELIQUIS) 5 MG TABS tablet Take 1 tablet by mouth twice daily 180 tablet 1   ascorbic acid (VITAMIN C) 500 MG tablet Take 1 tablet (500 mg total) by mouth daily. 14 tablet 0   atorvastatin (LIPITOR) 80 MG tablet Take 1 tablet (80 mg total) by mouth daily at 6 PM. (Patient taking differently: Take 80 mg by mouth at bedtime.) 30 tablet 0   benazepril (LOTENSIN) 40 MG tablet Take 1 tablet by mouth once daily for 90     ergocalciferol (VITAMIN D2) 50000 units capsule Take 50,000 Units by mouth  every Sunday.     gabapentin (NEURONTIN) 600 MG tablet Take 600 mg by mouth every evening.     glucose blood (ONETOUCH ULTRA) test strip USE 1 STRIP TO CHECK GLUCOSE 4 TIMES DAILY     hydroxyurea (HYDREA) 500 MG capsule Take 2 capsules (1,000 mg total) by mouth 2 (two) times daily. May take with food to minimize GI side effects. 360 capsule 1   levothyroxine (SYNTHROID) 75 MCG tablet Take 75 mcg by mouth daily before breakfast.     metFORMIN (GLUCOPHAGE) 1000 MG tablet Take 500 mg by mouth in the morning and at bedtime.     metolazone (ZAROXOLYN) 2.5 MG tablet Take 1 tablet (2.5 mg total) by mouth 2 (two) times a week. Take on Tuesdays and Fridays 30 minutes before taking Torsemide. 25 tablet 0   metoprolol tartrate (LOPRESSOR) 50 MG tablet Take 50 mg by mouth 2 (two) times daily.     Multiple Vitamin (MULTI VITAMIN DAILY PO)      Multiple Vitamin (MULTIVITAMIN WITH MINERALS) TABS tablet Take 1 tablet by mouth daily with breakfast.     NOVOLIN 70/30 RELION (70-30) 100 UNIT/ML injection Inject 44 Units into the skin in the morning and at bedtime.     omeprazole (PRILOSEC OTC) 20 MG tablet Take 20 mg by mouth daily before breakfast.     ONETOUCH VERIO test strip SMARTSIG:Via Meter     polyethylene glycol powder (MIRALAX) 17 GM/SCOOP powder Please take 6 capfuls of MiraLAX in a 16 oz bottle of Gatorade over 2-4 hour period. The following day take 3 capfuls. On day 3 start taking 1 capful 3 times a day. Slowly cut back as needed until you have normal bowel movements. (Patient taking differently: Take 17 g by mouth daily as needed for mild constipation (mix and drink as directed).) 255 g 0   potassium chloride (KLOR-CON) 10 MEQ tablet Take 10 mEq by mouth daily.     senna-docusate (SENOKOT-S) 8.6-50 MG tablet Take 2 tablets by mouth at bedtime as needed for mild constipation. 30 tablet 0  tamsulosin (FLOMAX) 0.4 MG CAPS capsule Take 0.4 mg by mouth daily.     topiramate (TOPAMAX) 50 MG tablet TAKE 1  TABLET BY MOUTH AT BEDTIME 30 tablet 9   torsemide (DEMADEX) 20 MG tablet Take 3 tablets (60 mg total) by mouth daily. 270 tablet 0   venlafaxine XR (EFFEXOR-XR) 150 MG 24 hr capsule Take 150 mg by mouth daily with breakfast.     No current facility-administered medications for this visit.    REVIEW OF SYSTEMS:   Constitutional: ( - ) fevers, ( - )  chills , ( - ) night sweats Eyes: ( - ) blurriness of vision, ( - ) double vision, ( - ) watery eyes Ears, nose, mouth, throat, and face: ( - ) mucositis, ( - ) sore throat Respiratory: ( - ) cough, ( - ) dyspnea, ( - ) wheezes Cardiovascular: ( - ) palpitation, ( - ) chest discomfort, ( - ) lower extremity swelling Gastrointestinal:  ( - ) nausea, ( - ) heartburn, ( - ) change in bowel habits Skin: ( - ) abnormal skin rashes Lymphatics: ( - ) new lymphadenopathy, ( - ) easy bruising Neurological: ( - ) numbness, ( - ) tingling, ( - ) new weaknesses Behavioral/Psych: ( - ) mood change, ( - ) new changes  All other systems were reviewed with the patient and are negative.  PHYSICAL EXAMINATION: ECOG PERFORMANCE STATUS: 1 - Symptomatic but completely ambulatory  Vitals:   10/24/23 1135  BP: (!) 138/56  Pulse: (!) 40  Resp: 13  Temp: (!) 97.3 F (36.3 C)  SpO2: 100%      Filed Weights   10/24/23 1135  Weight: 247 lb 14.4 oz (112.4 kg)       GENERAL: Well-appearing elderly Caucasian male alert, no distress and comfortable SKIN: skin color, texture, turgor are normal, no rashes or significant lesions EYES: conjunctiva are pink and non-injected, sclera clear LUNGS: clear to auscultation and percussion with normal breathing effort HEART: regular rate & rhythm and no murmurs and +2 bilateral lower extremity edema Musculoskeletal: no cyanosis of digits and no clubbing  PSYCH: alert & oriented x 3, fluent speech NEURO: no focal motor/sensory deficits  LABORATORY DATA:  I have reviewed the data as listed    Latest Ref Rng &  Units 10/24/2023   10:01 AM 08/29/2023    3:00 PM 07/31/2023    2:16 PM  CBC  WBC 4.0 - 10.5 K/uL 3.5  3.3  5.1   Hemoglobin 13.0 - 17.0 g/dL 9.6  9.6  13.0   Hematocrit 39.0 - 52.0 % 26.9  27.7  34.3   Platelets 150 - 400 K/uL 671  456  765        Latest Ref Rng & Units 10/24/2023   10:01 AM 08/29/2023    3:00 PM 07/31/2023    2:16 PM  CMP  Glucose 70 - 99 mg/dL 865  784  696   BUN 8 - 23 mg/dL 32  30  42   Creatinine 0.61 - 1.24 mg/dL 2.95  2.84  1.32   Sodium 135 - 145 mmol/L 138  140  140   Potassium 3.5 - 5.1 mmol/L 3.9  4.6  3.7   Chloride 98 - 111 mmol/L 96  104  98   CO2 22 - 32 mmol/L 34  31  36   Calcium 8.9 - 10.3 mg/dL 9.1  9.3  44.0   Total Protein 6.5 - 8.1 g/dL 6.7  6.2  7.1   Total Bilirubin 0.0 - 1.2 mg/dL 0.8  0.6  0.9   Alkaline Phos 38 - 126 U/L 120  98  93   AST 15 - 41 U/L 20  16  22    ALT 0 - 44 U/L 20  17  24      RADIOGRAPHIC STUDIES: No results found.  ASSESSMENT & PLAN [name] 83 y.o. male with medical history significant for essential thrombocytosis who presents for a follow up visit.   (1) essential thrombocytosis: Started on Hydrea 1000 mg a day 10/06/2017             (a) Hydrea dose increased to 1500 mg daily beginning 06/27/2019   (2) bone marrow biopsy discussed, postponed   (3) 0.7 cm right upper lung nodule incidentally noted on neck CT scans November 2018             (a) no change on repeat CT of the chest with contrast 02/28/2018             (b) chest x-ray 10/13/2018 obtained for evaluation of a cough shows no mention of a mass  # Essential Thrombocytosis JAK2 Positive.  -- Recommend holding hydroxyurea due to cytopenias.  Once his counts recover we could consider restarting hydroxyurea versus anagrelide.  At this time I favor anagrelide 1 mg twice daily for starters (once Hgb >11 and WBC >4.0) --Labs today show white blood cell count 3.5, hemoglobin 9.6, MCV 115.9, platelets 671 --Continue Eliquis 5 mg twice daily.  This  will provide thromboprophylaxis, no need for aspirin therapy. --plan for labs weekly in his facility and q 2 weeks upon discharge from his facility with clinic visit in 2 months.   No orders of the defined types were placed in this encounter.   All questions were answered. The patient knows to call the clinic with any problems, questions or concerns.  A total of more than 30 minutes were spent on this encounter with face-to-face time and non-face-to-face time, including preparing to see the patient, ordering tests and/or medications, counseling the patient and coordination of care as outlined above.   Ulysees Barns, MD Department of Hematology/Oncology [facility] Phone: [phone] Pager: [phone] Email: [email or web address]  10/24/2023 1:48 PM

## 2023-10-24 NOTE — Progress Notes (Signed)
Pt tolerated Zofran 8 mg tab. Advised to f/u as scheduled. Pt transported out via wheelchair by son.

## 2023-10-30 ENCOUNTER — Other Ambulatory Visit: Payer: Medicare HMO

## 2023-10-30 DIAGNOSIS — I951 Orthostatic hypotension: Secondary | ICD-10-CM | POA: Diagnosis not present

## 2023-10-30 DIAGNOSIS — I480 Paroxysmal atrial fibrillation: Secondary | ICD-10-CM | POA: Diagnosis not present

## 2023-11-06 ENCOUNTER — Ambulatory Visit: Payer: Medicare HMO | Admitting: Hematology and Oncology

## 2023-11-06 ENCOUNTER — Other Ambulatory Visit: Payer: Medicare HMO

## 2023-11-06 DIAGNOSIS — E119 Type 2 diabetes mellitus without complications: Secondary | ICD-10-CM | POA: Diagnosis not present

## 2023-11-06 DIAGNOSIS — I480 Paroxysmal atrial fibrillation: Secondary | ICD-10-CM | POA: Diagnosis not present

## 2023-11-06 DIAGNOSIS — I679 Cerebrovascular disease, unspecified: Secondary | ICD-10-CM | POA: Diagnosis not present

## 2023-11-06 DIAGNOSIS — F0390 Unspecified dementia without behavioral disturbance: Secondary | ICD-10-CM | POA: Diagnosis not present

## 2023-11-07 ENCOUNTER — Other Ambulatory Visit: Payer: Self-pay | Admitting: Physician Assistant

## 2023-11-09 DIAGNOSIS — I509 Heart failure, unspecified: Secondary | ICD-10-CM | POA: Diagnosis not present

## 2023-11-09 DIAGNOSIS — I951 Orthostatic hypotension: Secondary | ICD-10-CM | POA: Diagnosis not present

## 2023-11-09 DIAGNOSIS — E785 Hyperlipidemia, unspecified: Secondary | ICD-10-CM | POA: Diagnosis not present

## 2023-11-09 DIAGNOSIS — Z8673 Personal history of transient ischemic attack (TIA), and cerebral infarction without residual deficits: Secondary | ICD-10-CM | POA: Diagnosis not present

## 2023-11-09 DIAGNOSIS — K5909 Other constipation: Secondary | ICD-10-CM | POA: Diagnosis not present

## 2023-11-09 DIAGNOSIS — E039 Hypothyroidism, unspecified: Secondary | ICD-10-CM | POA: Diagnosis not present

## 2023-11-09 DIAGNOSIS — F0283 Dementia in other diseases classified elsewhere, unspecified severity, with mood disturbance: Secondary | ICD-10-CM | POA: Diagnosis not present

## 2023-11-09 DIAGNOSIS — E119 Type 2 diabetes mellitus without complications: Secondary | ICD-10-CM | POA: Diagnosis not present

## 2023-11-09 DIAGNOSIS — K219 Gastro-esophageal reflux disease without esophagitis: Secondary | ICD-10-CM | POA: Diagnosis not present

## 2023-11-09 DIAGNOSIS — Z8701 Personal history of pneumonia (recurrent): Secondary | ICD-10-CM | POA: Diagnosis not present

## 2023-11-09 DIAGNOSIS — F329 Major depressive disorder, single episode, unspecified: Secondary | ICD-10-CM | POA: Diagnosis not present

## 2023-11-09 DIAGNOSIS — Z7984 Long term (current) use of oral hypoglycemic drugs: Secondary | ICD-10-CM | POA: Diagnosis not present

## 2023-11-09 DIAGNOSIS — I110 Hypertensive heart disease with heart failure: Secondary | ICD-10-CM | POA: Diagnosis not present

## 2023-11-09 DIAGNOSIS — Z794 Long term (current) use of insulin: Secondary | ICD-10-CM | POA: Diagnosis not present

## 2023-11-09 DIAGNOSIS — I480 Paroxysmal atrial fibrillation: Secondary | ICD-10-CM | POA: Diagnosis not present

## 2023-11-09 DIAGNOSIS — M1990 Unspecified osteoarthritis, unspecified site: Secondary | ICD-10-CM | POA: Diagnosis not present

## 2023-11-09 DIAGNOSIS — F0284 Dementia in other diseases classified elsewhere, unspecified severity, with anxiety: Secondary | ICD-10-CM | POA: Diagnosis not present

## 2023-11-09 DIAGNOSIS — Z9181 History of falling: Secondary | ICD-10-CM | POA: Diagnosis not present

## 2023-11-09 DIAGNOSIS — Z556 Problems related to health literacy: Secondary | ICD-10-CM | POA: Diagnosis not present

## 2023-11-09 DIAGNOSIS — Z7901 Long term (current) use of anticoagulants: Secondary | ICD-10-CM | POA: Diagnosis not present

## 2023-11-13 ENCOUNTER — Other Ambulatory Visit: Payer: Medicare HMO

## 2023-11-15 DIAGNOSIS — I110 Hypertensive heart disease with heart failure: Secondary | ICD-10-CM | POA: Diagnosis not present

## 2023-11-15 DIAGNOSIS — D473 Essential (hemorrhagic) thrombocythemia: Secondary | ICD-10-CM | POA: Diagnosis not present

## 2023-11-15 DIAGNOSIS — F329 Major depressive disorder, single episode, unspecified: Secondary | ICD-10-CM | POA: Diagnosis not present

## 2023-11-15 DIAGNOSIS — I5032 Chronic diastolic (congestive) heart failure: Secondary | ICD-10-CM | POA: Diagnosis not present

## 2023-11-15 DIAGNOSIS — Z556 Problems related to health literacy: Secondary | ICD-10-CM | POA: Diagnosis not present

## 2023-11-15 DIAGNOSIS — M1990 Unspecified osteoarthritis, unspecified site: Secondary | ICD-10-CM | POA: Diagnosis not present

## 2023-11-15 DIAGNOSIS — F0283 Dementia in other diseases classified elsewhere, unspecified severity, with mood disturbance: Secondary | ICD-10-CM | POA: Diagnosis not present

## 2023-11-15 DIAGNOSIS — Z8701 Personal history of pneumonia (recurrent): Secondary | ICD-10-CM | POA: Diagnosis not present

## 2023-11-15 DIAGNOSIS — E1140 Type 2 diabetes mellitus with diabetic neuropathy, unspecified: Secondary | ICD-10-CM | POA: Diagnosis not present

## 2023-11-15 DIAGNOSIS — Z7901 Long term (current) use of anticoagulants: Secondary | ICD-10-CM | POA: Diagnosis not present

## 2023-11-15 DIAGNOSIS — N1831 Chronic kidney disease, stage 3a: Secondary | ICD-10-CM | POA: Diagnosis not present

## 2023-11-15 DIAGNOSIS — I480 Paroxysmal atrial fibrillation: Secondary | ICD-10-CM | POA: Diagnosis not present

## 2023-11-15 DIAGNOSIS — I509 Heart failure, unspecified: Secondary | ICD-10-CM | POA: Diagnosis not present

## 2023-11-15 DIAGNOSIS — I130 Hypertensive heart and chronic kidney disease with heart failure and stage 1 through stage 4 chronic kidney disease, or unspecified chronic kidney disease: Secondary | ICD-10-CM | POA: Diagnosis not present

## 2023-11-15 DIAGNOSIS — E039 Hypothyroidism, unspecified: Secondary | ICD-10-CM | POA: Diagnosis not present

## 2023-11-15 DIAGNOSIS — F0284 Dementia in other diseases classified elsewhere, unspecified severity, with anxiety: Secondary | ICD-10-CM | POA: Diagnosis not present

## 2023-11-15 DIAGNOSIS — E785 Hyperlipidemia, unspecified: Secondary | ICD-10-CM | POA: Diagnosis not present

## 2023-11-15 DIAGNOSIS — Z794 Long term (current) use of insulin: Secondary | ICD-10-CM | POA: Diagnosis not present

## 2023-11-15 DIAGNOSIS — E119 Type 2 diabetes mellitus without complications: Secondary | ICD-10-CM | POA: Diagnosis not present

## 2023-11-15 DIAGNOSIS — E113552 Type 2 diabetes mellitus with stable proliferative diabetic retinopathy, left eye: Secondary | ICD-10-CM | POA: Diagnosis not present

## 2023-11-15 DIAGNOSIS — K592 Neurogenic bowel, not elsewhere classified: Secondary | ICD-10-CM | POA: Diagnosis not present

## 2023-11-15 DIAGNOSIS — K219 Gastro-esophageal reflux disease without esophagitis: Secondary | ICD-10-CM | POA: Diagnosis not present

## 2023-11-15 DIAGNOSIS — Z8673 Personal history of transient ischemic attack (TIA), and cerebral infarction without residual deficits: Secondary | ICD-10-CM | POA: Diagnosis not present

## 2023-11-15 DIAGNOSIS — Z7984 Long term (current) use of oral hypoglycemic drugs: Secondary | ICD-10-CM | POA: Diagnosis not present

## 2023-11-15 DIAGNOSIS — I951 Orthostatic hypotension: Secondary | ICD-10-CM | POA: Diagnosis not present

## 2023-11-15 DIAGNOSIS — F0390 Unspecified dementia without behavioral disturbance: Secondary | ICD-10-CM | POA: Diagnosis not present

## 2023-11-15 DIAGNOSIS — K5909 Other constipation: Secondary | ICD-10-CM | POA: Diagnosis not present

## 2023-11-15 DIAGNOSIS — Z9181 History of falling: Secondary | ICD-10-CM | POA: Diagnosis not present

## 2023-11-16 ENCOUNTER — Encounter: Payer: Self-pay | Admitting: Endocrinology

## 2023-11-16 DIAGNOSIS — I480 Paroxysmal atrial fibrillation: Secondary | ICD-10-CM | POA: Diagnosis not present

## 2023-11-16 DIAGNOSIS — F0284 Dementia in other diseases classified elsewhere, unspecified severity, with anxiety: Secondary | ICD-10-CM | POA: Diagnosis not present

## 2023-11-16 DIAGNOSIS — I951 Orthostatic hypotension: Secondary | ICD-10-CM | POA: Diagnosis not present

## 2023-11-16 DIAGNOSIS — K219 Gastro-esophageal reflux disease without esophagitis: Secondary | ICD-10-CM | POA: Diagnosis not present

## 2023-11-16 DIAGNOSIS — Z556 Problems related to health literacy: Secondary | ICD-10-CM | POA: Diagnosis not present

## 2023-11-16 DIAGNOSIS — Z7984 Long term (current) use of oral hypoglycemic drugs: Secondary | ICD-10-CM | POA: Diagnosis not present

## 2023-11-16 DIAGNOSIS — Z7901 Long term (current) use of anticoagulants: Secondary | ICD-10-CM | POA: Diagnosis not present

## 2023-11-16 DIAGNOSIS — M1990 Unspecified osteoarthritis, unspecified site: Secondary | ICD-10-CM | POA: Diagnosis not present

## 2023-11-16 DIAGNOSIS — E039 Hypothyroidism, unspecified: Secondary | ICD-10-CM | POA: Diagnosis not present

## 2023-11-16 DIAGNOSIS — I110 Hypertensive heart disease with heart failure: Secondary | ICD-10-CM | POA: Diagnosis not present

## 2023-11-16 DIAGNOSIS — E785 Hyperlipidemia, unspecified: Secondary | ICD-10-CM | POA: Diagnosis not present

## 2023-11-16 DIAGNOSIS — F0283 Dementia in other diseases classified elsewhere, unspecified severity, with mood disturbance: Secondary | ICD-10-CM | POA: Diagnosis not present

## 2023-11-16 DIAGNOSIS — K5909 Other constipation: Secondary | ICD-10-CM | POA: Diagnosis not present

## 2023-11-16 DIAGNOSIS — Z9181 History of falling: Secondary | ICD-10-CM | POA: Diagnosis not present

## 2023-11-16 DIAGNOSIS — F329 Major depressive disorder, single episode, unspecified: Secondary | ICD-10-CM | POA: Diagnosis not present

## 2023-11-16 DIAGNOSIS — I509 Heart failure, unspecified: Secondary | ICD-10-CM | POA: Diagnosis not present

## 2023-11-16 DIAGNOSIS — Z8673 Personal history of transient ischemic attack (TIA), and cerebral infarction without residual deficits: Secondary | ICD-10-CM | POA: Diagnosis not present

## 2023-11-16 DIAGNOSIS — Z8701 Personal history of pneumonia (recurrent): Secondary | ICD-10-CM | POA: Diagnosis not present

## 2023-11-16 DIAGNOSIS — Z794 Long term (current) use of insulin: Secondary | ICD-10-CM | POA: Diagnosis not present

## 2023-11-16 DIAGNOSIS — E119 Type 2 diabetes mellitus without complications: Secondary | ICD-10-CM | POA: Diagnosis not present

## 2023-11-20 ENCOUNTER — Other Ambulatory Visit: Payer: Medicare HMO

## 2023-11-20 DIAGNOSIS — K5909 Other constipation: Secondary | ICD-10-CM | POA: Diagnosis not present

## 2023-11-20 DIAGNOSIS — I509 Heart failure, unspecified: Secondary | ICD-10-CM | POA: Diagnosis not present

## 2023-11-20 DIAGNOSIS — Z556 Problems related to health literacy: Secondary | ICD-10-CM | POA: Diagnosis not present

## 2023-11-20 DIAGNOSIS — E119 Type 2 diabetes mellitus without complications: Secondary | ICD-10-CM | POA: Diagnosis not present

## 2023-11-20 DIAGNOSIS — Z794 Long term (current) use of insulin: Secondary | ICD-10-CM | POA: Diagnosis not present

## 2023-11-20 DIAGNOSIS — I110 Hypertensive heart disease with heart failure: Secondary | ICD-10-CM | POA: Diagnosis not present

## 2023-11-20 DIAGNOSIS — F0283 Dementia in other diseases classified elsewhere, unspecified severity, with mood disturbance: Secondary | ICD-10-CM | POA: Diagnosis not present

## 2023-11-20 DIAGNOSIS — Z8673 Personal history of transient ischemic attack (TIA), and cerebral infarction without residual deficits: Secondary | ICD-10-CM | POA: Diagnosis not present

## 2023-11-20 DIAGNOSIS — M1990 Unspecified osteoarthritis, unspecified site: Secondary | ICD-10-CM | POA: Diagnosis not present

## 2023-11-20 DIAGNOSIS — K219 Gastro-esophageal reflux disease without esophagitis: Secondary | ICD-10-CM | POA: Diagnosis not present

## 2023-11-20 DIAGNOSIS — Z8701 Personal history of pneumonia (recurrent): Secondary | ICD-10-CM | POA: Diagnosis not present

## 2023-11-20 DIAGNOSIS — Z9181 History of falling: Secondary | ICD-10-CM | POA: Diagnosis not present

## 2023-11-20 DIAGNOSIS — Z7901 Long term (current) use of anticoagulants: Secondary | ICD-10-CM | POA: Diagnosis not present

## 2023-11-20 DIAGNOSIS — I480 Paroxysmal atrial fibrillation: Secondary | ICD-10-CM | POA: Diagnosis not present

## 2023-11-20 DIAGNOSIS — Z7984 Long term (current) use of oral hypoglycemic drugs: Secondary | ICD-10-CM | POA: Diagnosis not present

## 2023-11-20 DIAGNOSIS — E785 Hyperlipidemia, unspecified: Secondary | ICD-10-CM | POA: Diagnosis not present

## 2023-11-20 DIAGNOSIS — E039 Hypothyroidism, unspecified: Secondary | ICD-10-CM | POA: Diagnosis not present

## 2023-11-20 DIAGNOSIS — F0284 Dementia in other diseases classified elsewhere, unspecified severity, with anxiety: Secondary | ICD-10-CM | POA: Diagnosis not present

## 2023-11-20 DIAGNOSIS — I951 Orthostatic hypotension: Secondary | ICD-10-CM | POA: Diagnosis not present

## 2023-11-20 DIAGNOSIS — F329 Major depressive disorder, single episode, unspecified: Secondary | ICD-10-CM | POA: Diagnosis not present

## 2023-11-22 ENCOUNTER — Encounter: Payer: Self-pay | Admitting: Adult Health

## 2023-11-22 ENCOUNTER — Ambulatory Visit: Payer: Medicare HMO | Admitting: Adult Health

## 2023-11-22 VITALS — BP 120/48 | HR 54 | Ht 72.0 in | Wt 269.8 lb

## 2023-11-22 DIAGNOSIS — I63512 Cerebral infarction due to unspecified occlusion or stenosis of left middle cerebral artery: Secondary | ICD-10-CM | POA: Diagnosis not present

## 2023-11-22 DIAGNOSIS — R269 Unspecified abnormalities of gait and mobility: Secondary | ICD-10-CM | POA: Diagnosis not present

## 2023-11-22 DIAGNOSIS — R251 Tremor, unspecified: Secondary | ICD-10-CM | POA: Diagnosis not present

## 2023-11-22 MED ORDER — TOPIRAMATE 50 MG PO TABS: 100.0000 mg | ORAL_TABLET | Freq: Every day | ORAL | 11 refills | Status: DC

## 2023-11-22 NOTE — Progress Notes (Signed)
 [name] [address] [place]. [place] [address] [phone]       OFFICE FOLLOW-UP NOTE  [name] Date of Birth:  [birth year] Medical Record Number:  [id number]   Reason for visit: Stroke follow-up [facility] provider: Dr. Pearlean Brownie   Chief Complaint  Patient presents with   Follow-up    Pt in room 3. Alone. Here for stroke follow up. Pt reports doing good. Pt reports body shakes, pt said mornings are worse. Pt reports he fell was seen at ED 10/06/23 and fall ago 1 week later.      HPI:  Stroke admission 07/2017 PS: [name] is a 83 y.o. male with a history of afib on Xarelto who has been having difficulty speaking since awakening this morning. He states that it seems worse at times, butthese episodes of worsening are only for a few seconds. He has also had two episodes of right sided numbness lasting a few seconds as well. 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 .LKW: 11/27 prior to bed. tpa given?: no, out of window.CT scan of the head showed acute small left frontal MCA territory nonhemorrhagic infarct and moderate changes of small vessel disease. 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 CT angiogram of the brain showed moderate stenosis of left M2 and proximal right posterior cerebral arteries.MRI scan of the brain confirmed a small foci of acute infarcts in the posterior left MCA territory involving posterior frontal and posterior parietal lobes likely emboli. Patient had known history of atrial fibrillation and was on Xarelto and yet had breakthrough infarcts.hemoglobin A1c was elevated at 7.8. Patient was changed from Xarelto to eliquis for second stroke prevention.patient had elevated platelet count of 881,000 which was up from a year ago from 623,000. 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 Bone  marrow biopsy was discussed but not done.   10/11/17 visit PS: Patient has been started on hydroxyurea by oncology. Patient states that he still has some intermittent numbness and tingling in his right hand but it is getting better it occurs once or twice a week and last only 30 seconds. This is often triggered by having his neck or arms in strange positions like stretching backwards. He is tolerating eliquis well without bleeding or bruising. He states his blood pressure is well controlled and today it is 130/79. He continues to have trouble with his sugars which remained high and last hemoglobin A1c was 8.1. Patient has chronic right knee pain and actually had scheduled right knee surgery with Dr. Stevphen Rochester in February that now is willing to wait for 6 months since his stroke. He does also have sleep apnea but he has not been compliant with CPAP as he cannot tolerate it. He does have chronic paresthesias in his feet from diabetic neuropathy which is stable   Update 04/17/2018 JM: Patient is being seen today for routine stroke follow-up appointment and overall is doing well from a stroke standpoint.  He continues to take Eliquis without bleeding or bruising for his atrial fibrillation and is managed by his cardiologist.  Continues to take Lipitor without side effects of myalgias.  Blood pressure today satisfactory 128/60.  He does have history of bilateral lower extremity diabetic neuropathy for which he takes gabapentin 300 mg at night.  He continues to have neuropathy pain along with possibly worsening neuropathy  with numbness and tingling going up into his calfs.  He has been compliant with gabapentin 300 mg at night but states he does not notice a difference with his neuropathy pain.  He has had a recent fall approximately 1 week ago where he was try to let his dog outside and when he bent over to help him out the door, he fell forward and landed on his right side.  He denies hitting his head but does have mild  residual right sided pain. Patient states his knee limits him from prolonged activity and standing for any length of time along with increased difficulty ambulating.  Patient also has complaints of bilateral lower extremity swelling.  Patient did speak with cardiologist in regards to this and recommended compression stockings but per notes, he was refusing his treatment and no additional intervention needed.  Patient continues to be noncompliant with CPAP stating he is unable to tolerate machine despite risks of not having OSA treated.  Denies new or worsening stroke/TIA symptoms.  Update 05/22/2019 JM: [name] is being seen today for stroke follow-up.  He has been doing well from a stroke standpoint without residual deficits or reoccurring symptoms.  He continues on Eliquis for atrial fibrillation secondary stroke prevention without side effects.  Continues on atorvastatin without myalgias.  Blood pressure today 119/60.  He did undergo right knee arthroplasty on 10/08/2018 without complication.  He recently completed physical therapy but is considering participating in additional sessions.  Glucose levels have been stable. Denies new or worsening stroke/TIA symptoms. He has multiple other complaints including blurred vision, leg swelling, and insomnia.  He is routinely being followed by ophthalmology, cardiology and PCP in regards to these concerns.  Update 01/27/2022 : Patient has been referred back to see me today by Laurann Montana, PA-C for new complaints of hand tremors and jerkiness.  Patient states this has been going on for a few months.  There is this is not constant but intermittently when he raises his hands he notices sudden jerk and on occasions he is throwing objects he has been holding from his hand like a pill bottle.  This happens mostly in the morning when he is sitting at the breakfast table and trying to hold objects but can occur off and on all day.  He denies any voice tremor or similar jerking  in the legs.  Is also noticed increasing falls and has had 5 falls in the last 3 weeks with sustaining bruises on his elbows and hips and thighs.  He does have longstanding diabetic neuropathy with paresthesias with poor balance as well as some residual right leg weakness from his previous stroke which could also contribute to his balance.  He has started using a cane but does not find it very helpful and feels he may fall even with a cane or walker if he had 1 in the falls or so sudden.  He does take gabapentin 600 mg at supper and quite often ends up taking another 600 mg at night if he is bothered by the paresthesias.  He has not had any recent lab work that I can see in the last lab a month ago had shown borderline creatinine and elevated potassium.  He does have thrombocytosis but this seems to be well controlled on hydroxyurea.  He remains on Eliquis which is tolerating well we will without significant bleeding but does bruise easily and has multiple bruises on his body.  Patient states he has been to physical therapy  multiple times and does not find it helpful and is refusing for another referral to improve his gait and balance   UPDATE 05/05/2022 JM: patient returns for follow up after prior visit with Dr. Pearlean Brownie 3 months ago regarding hand tremors and jerkiness. He is accompanied by his son. Discontinued gabapentin and switched to topamax 50mg  nightly. Reports improvement of tremors since starting topamax but still has some tremor upon waking but does improve as day progresses. Does topamax in the morning. Denies side effects.  Son has other concerns regarding difficulty falling asleep at night and sleeping til mid afternoon, memory issues with gradual decline over time and continued imbalance.  He and his wife moved to [facility] senior living last week. Feels he has been sleeping better but also does not yet have TV in his room. He does have hx of sleep apnea intolerant to CPAP. Continues to  refuse CPAP treatment.  Short term memory forgetting recent conversations or events. Has difficulty remembering his grandchildren's birthdays.  MMSE today 26/30. Previously using cane, family has been trying to encourage use of rollator walker for chronic multifactoral gait impairment. Denies any worsening since prior visit.   Update 11/09/2022 JM: Patient returns for 93-month follow-up unaccompanied.  Continues to reside at [facility] in [place] with his wife.   Reports tremors have been stable since prior visit, does not interfere with daily activity or functioning. Can be worse in the morning and evening but not overly bothersome during the day.  Currently on topiramate 50 mg nightly, tolerating without side effects.  Cognition has been stable since prior visit.  More issues with short-term memory and delayed recall.  Able to maintain ADLs independently.   Gait has been stable, use of rollator walker, denies any recent falls  Stable from stroke standpoint without any new stroke/TIA symptoms.  Compliant on Eliquis and atorvastatin.  Routinely follows with PCP and cardiology.    Update 11/22/2023 JM: Patient returns for yearly follow-up unaccompanied.    Did have a fall back on 1/25 where he was seen in the ED, laceration back of head over left eye. CTH and c-spine unremarkable for acute findings.  Glucose on scene 69. Noted dentist appointment day prior where her tooth was pulled.  Lab work without significant leukocytosis or anemia, noted new leukopenia with neutrophil count but no fever and no infectious symptoms, no significant electrolyte or metabolic derangements.  Returned to ED on 1/31 with weakness and confusion.  Tx'd for anemia and leukopenia secondary to hydroxyurea toxicity which was discontinued and received blood transfusion.  Febrile on admission but no source of infection found therapies recommended SNF for rehab.  He has since returned back to senior living. Blood  pressure has since been stable, no longer having any further hypotensive episodes. No additional falls, uses RW at all times.   Has since had follow-up with oncology with hydroxyurea still on hold due to cytopenias and once counts recover, consider restarting hydroxyurea vs anagrelide.  Has follow-up visit next month.  Believes tremor has gradually worsened since prior visit especially in the morning, can interfere with eating breakfast such as holding a cup or cereal bowl.  Gradually improves throughout the day but then worsens again in the evening time.  Continues on topamax 50mg  nightly, tolerating well.   Remains on Eliquis and atorvastatin.  No new stroke/TIA symptoms.  Routinely follows with PCP and cardiology.      ROS:   14 system review of systems is positive  for those listed in HPI and all other systems negative  PMH:  Past Medical History:  Diagnosis Date   Anxiety    Arthritis    Basal cell carcinoma 07/18/1991   Left nasal brdige (MOHS)   Basal cell carcinoma 01/23/1992   lower right back-(CX35FU)   Basal cell carcinoma 05/20/2003   sup-left back (CX35FU)   Basal cell carcinoma 07/28/2011   post lower neck   Basal cell carcinoma 08/20/2008   right sideburn(MOHS), sup-Left upper back (CX35FU), sup-mid back (CX35FU), nod-Right lower back )CX35FU), nod-right upperarm (CX35FU)   Basal cell carcinoma 06/08/2016   sup-Left upper back (CX35FU), mid back (CX35FU), right lower back (CX35FU), nod-Right upperarm (CX35FU)   Depression    Diabetes mellitus without complication (HCC)    GERD (gastroesophageal reflux disease)    Hyperlipidemia    Hypertension    Neuropathy    Obesity    Paroxysmal atrial fibrillation (HCC)    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   in situ left forearm posterior tx after biopsy    SCCA (squamous cell carcinoma) of skin 01/20/2021   Right Forearm Posterior (in situ)   SCCA (squamous cell carcinoma) of skin 01/20/2021   Left  Forearm Posterior (in situ)   SCCA (squamous cell carcinoma) of skin 01/24/2022   Right Temple Sup. (in situ)   Sleep apnea    uses C-pap machine   Squamous cell carcinoma of skin 02/11/2013   in situ-Right temple (CX35FU)   Squamous cell carcinoma of skin 08/20/2008   in situ- front scalp (CX35FU)   Squamous cell carcinoma of skin 06/08/2016   in situ-front scalp (CX35FU)   Squamous cell carcinoma of skin 02/05/2019   in situ-right sideburn-sup (CX35FU), in situ-right sideburn,inf (CX35FU)   Stroke (HCC)    08/09/2017   Superficial basal cell carcinoma (BCC) 01/20/2021   Scalp    Social History:  Social History   Socioeconomic History   Marital status: Married    Spouse name: Thurston Hole   Number of children: Not on file   Years of education: Not on file   Highest education level: Not on file  Occupational History   Not on file  Tobacco Use   Smoking status: Former    Current packs/day: 0.00    Average packs/day: 1 pack/day for 30.0 years (30.0 ttl pk-yrs)    Types: Cigars, Cigarettes    Start date: 08/02/1962    Quit date: 08/02/1992    Years since quitting: 31.3   Smokeless tobacco: Never  Vaping Use   Vaping status: Never Used  Substance and Sexual Activity   Alcohol use: Yes    Comment: occasionally   Drug use: No   Sexual activity: Not on file  Other Topics Concern   Not on file  Social History Narrative   Lives w wife   R handed   Caffeine: 1 C of coffee a day   Social Drivers of Corporate investment banker Strain: Not on file  Food Insecurity: No Food Insecurity (08/28/2022)   Hunger Vital Sign    Worried About Running Out of Food in the Last Year: Never true    Ran Out of Food in the Last Year: Never true  Transportation Needs: No Transportation Needs (08/28/2022)   PRAPARE - Administrator, Civil Service (Medical): No    Lack of Transportation (Non-Medical): No  Physical Activity: Not on file  Stress: Not on file  Social Connections: Not  on file  Intimate  Partner Violence: Not At Risk (08/28/2022)   Humiliation, Afraid, Rape, and Kick questionnaire    Fear of Current or Ex-Partner: No    Emotionally Abused: No    Physically Abused: No    Sexually Abused: No    Medications:   Current Outpatient Medications on File Prior to Visit  Medication Sig Dispense Refill   acetaminophen (TYLENOL) 325 MG tablet Take 2 tablets (650 mg total) by mouth every 6 (six) hours as needed for mild pain (or Fever >/= 101).     ALPRAZolam (XANAX) 0.5 MG tablet Take 0.5 mg by mouth 2 (two) times daily.     apixaban (ELIQUIS) 5 MG TABS tablet Take 1 tablet by mouth twice daily 180 tablet 1   ascorbic acid (VITAMIN C) 500 MG tablet Take 1 tablet (500 mg total) by mouth daily. 14 tablet 0   atorvastatin (LIPITOR) 80 MG tablet Take 1 tablet (80 mg total) by mouth daily at 6 PM. (Patient taking differently: Take 80 mg by mouth at bedtime.) 30 tablet 0   benazepril (LOTENSIN) 40 MG tablet Take 1 tablet by mouth once daily for 90     ergocalciferol (VITAMIN D2) 50000 units capsule Take 50,000 Units by mouth every Sunday.     gabapentin (NEURONTIN) 600 MG tablet Take 600 mg by mouth every evening.     glucose blood (ONETOUCH ULTRA) test strip USE 1 STRIP TO CHECK GLUCOSE 4 TIMES DAILY     levothyroxine (SYNTHROID) 75 MCG tablet Take 75 mcg by mouth daily before breakfast.     metFORMIN (GLUCOPHAGE) 1000 MG tablet Take 500 mg by mouth in the morning and at bedtime.     metolazone (ZAROXOLYN) 2.5 MG tablet TAKE 1 TABLET BY MOUTH TWICE A WEEK ON  TUESDAYS  AND  FRIDAYS  30  MINUTES  BEFORE  TAKING  TORSEMIDE 25 tablet 3   metoprolol tartrate (LOPRESSOR) 50 MG tablet Take 50 mg by mouth 2 (two) times daily.     Multiple Vitamin (MULTI VITAMIN DAILY PO)      Multiple Vitamin (MULTIVITAMIN WITH MINERALS) TABS tablet Take 1 tablet by mouth daily with breakfast.     NOVOLIN 70/30 RELION (70-30) 100 UNIT/ML injection Inject 44 Units into the skin in the morning and  at bedtime.     omeprazole (PRILOSEC OTC) 20 MG tablet Take 20 mg by mouth daily before breakfast.     ONETOUCH VERIO test strip SMARTSIG:Via Meter     polyethylene glycol powder (MIRALAX) 17 GM/SCOOP powder Please take 6 capfuls of MiraLAX in a 16 oz bottle of Gatorade over 2-4 hour period. The following day take 3 capfuls. On day 3 start taking 1 capful 3 times a day. Slowly cut back as needed until you have normal bowel movements. (Patient taking differently: Take 17 g by mouth daily as needed for mild constipation (mix and drink as directed).) 255 g 0   potassium chloride (KLOR-CON) 10 MEQ tablet Take 10 mEq by mouth daily.     senna-docusate (SENOKOT-S) 8.6-50 MG tablet Take 2 tablets by mouth at bedtime as needed for mild constipation. 30 tablet 0   tamsulosin (FLOMAX) 0.4 MG CAPS capsule Take 0.4 mg by mouth daily.     topiramate (TOPAMAX) 50 MG tablet TAKE 1 TABLET BY MOUTH AT BEDTIME 30 tablet 9   torsemide (DEMADEX) 20 MG tablet Take 3 tablets (60 mg total) by mouth daily. 270 tablet 0   venlafaxine XR (EFFEXOR-XR) 150 MG 24 hr capsule Take 150  mg by mouth daily with breakfast.     hydroxyurea (HYDREA) 500 MG capsule Take 2 capsules (1,000 mg total) by mouth 2 (two) times daily. May take with food to minimize GI side effects. (Patient not taking: Reported on 11/22/2023) 360 capsule 1   No current facility-administered medications on file prior to visit.    Allergies:   Allergies  Allergen Reactions   Penicillin G Sodium Other (See Comments)    Patient was told as a child he is allergic    Today's Vitals   11/22/23 1312  Weight: 269 lb 12.8 oz (122.4 kg)  Height: 6' (1.829 m)     Body mass index is 36.59 kg/m.  Physical Exam General: Obese elderly Caucasian male, , seated, in no evident distress Head: head normocephalic and atraumatic.  Neck: supple with no carotid or supraclavicular bruits Cardiovascular: regular rate and rhythm, no murmurs;  Musculoskeletal: no  deformity Vascular:  Normal pulses all extremities   Neurologic Exam Mental Status: Awake and fully alert.  Fluent speech and language.  Oriented to place and time. Recent memory mildly impaired and remote memory intact. Attention span, concentration and fund of knowledge appropriate. Mood and affect appropriate.  Cranial Nerves: Pupils equal, briskly reactive to light. Extraocular movements full without nystagmus. Visual fields full to confrontation. Hearing intact. Facial sensation intact. Face, tongue, palate moves normally and symmetrically.  Motor: Normal bulk and tone. Normal strength in all tested extremity muscles except mild weakness of right ankle dorsiflexors and plantar flexors.  Unable to appreciate any tremor on exam. Sensory.:  Decreased sensation to touch ,pinprick and vibratory sensation in bilateral lower extremities distally.   Coordination: Rapid alternating movements normal in all extremities. Finger-to-nose performed accurately and mild difficulty performing heel-to-shin due to pain Gait and Station: Arises from chair with  difficulty.  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 He also has essential thrombocytosis which which appears well controlled on hydroxyurea although now on hold in setting of cytopenia.  Vascular risk factors of diabetes, hyperlipidemia, obesity, OSA noncompliant with CPAP,atrial fibrillation and essential thrombocytosis.  New complaints of upper extremity tremors and myoclonic jerks likely due to metabolic encephalopathy from his mild renal and electrolyte dysfunction as well as medication effect from gabapentin.  Increasing gait and balance difficulties and frequent falls due to combination of underlying diabetic neuropathy as well as  damage from stroke and medication effect.     -Increase topamax to 100mg  nightly.  Discussed potential side effects.  Consider adding morning dose if needed.   -Highly encouraged use of rollator walker for fall prevention -Continue to follow closely with PCP and cardiology for aggressive stroke risk factor management and continuation of Eliquis and atorvastatin for secondary stroke prevention measures managed/prescribed by PCP/cardiology -Continue to follow with PCP for stroke risk factor management -Continue to follow with oncology for thrombocytosis     Follow-up in 6 months or call earlier if needed    CC:  Adrian Prince, MD    I spent 30 minutes of face-to-face and non-face-to-face time with patient.  This included previsit chart review, lab review, study review, order entry, electronic health record documentation, patient education and discussion regarding above diagnoses and treatment plan and answered all other questions to patient's satisfaction  Ihor Austin, AGNP-BC  [facility] [phone]  [address] [place], [place] [address]  Phone [phone] Fax [phone] Note: This document was prepared with digital dictation and possible smart phrase technology. Any transcriptional errors that result from this process are unintentional.

## 2023-11-22 NOTE — Patient Instructions (Addendum)
 Your Plan:  Increase topamax to 100mg  nightly to further help with tremors   Continue to follow with oncology and cardiology as scheduled   Continue to follow with PCP for stroke risk factor management      Follow up in 6 months or call earlier if needed      Thank you for coming to see [place] at [facility]. I hope we have been able to provide you high quality care today.  You may receive a patient satisfaction survey over the next few weeks. We would appreciate your feedback and comments so that we may continue to improve ourselves and the health of our patients.

## 2023-11-24 DIAGNOSIS — I509 Heart failure, unspecified: Secondary | ICD-10-CM | POA: Diagnosis not present

## 2023-11-24 DIAGNOSIS — M1990 Unspecified osteoarthritis, unspecified site: Secondary | ICD-10-CM | POA: Diagnosis not present

## 2023-11-24 DIAGNOSIS — Z9181 History of falling: Secondary | ICD-10-CM | POA: Diagnosis not present

## 2023-11-24 DIAGNOSIS — I110 Hypertensive heart disease with heart failure: Secondary | ICD-10-CM | POA: Diagnosis not present

## 2023-11-24 DIAGNOSIS — F329 Major depressive disorder, single episode, unspecified: Secondary | ICD-10-CM | POA: Diagnosis not present

## 2023-11-24 DIAGNOSIS — I480 Paroxysmal atrial fibrillation: Secondary | ICD-10-CM | POA: Diagnosis not present

## 2023-11-24 DIAGNOSIS — I951 Orthostatic hypotension: Secondary | ICD-10-CM | POA: Diagnosis not present

## 2023-11-24 DIAGNOSIS — K219 Gastro-esophageal reflux disease without esophagitis: Secondary | ICD-10-CM | POA: Diagnosis not present

## 2023-11-24 DIAGNOSIS — Z8701 Personal history of pneumonia (recurrent): Secondary | ICD-10-CM | POA: Diagnosis not present

## 2023-11-24 DIAGNOSIS — E039 Hypothyroidism, unspecified: Secondary | ICD-10-CM | POA: Diagnosis not present

## 2023-11-24 DIAGNOSIS — Z556 Problems related to health literacy: Secondary | ICD-10-CM | POA: Diagnosis not present

## 2023-11-24 DIAGNOSIS — Z8673 Personal history of transient ischemic attack (TIA), and cerebral infarction without residual deficits: Secondary | ICD-10-CM | POA: Diagnosis not present

## 2023-11-24 DIAGNOSIS — Z7901 Long term (current) use of anticoagulants: Secondary | ICD-10-CM | POA: Diagnosis not present

## 2023-11-24 DIAGNOSIS — K5909 Other constipation: Secondary | ICD-10-CM | POA: Diagnosis not present

## 2023-11-24 DIAGNOSIS — Z794 Long term (current) use of insulin: Secondary | ICD-10-CM | POA: Diagnosis not present

## 2023-11-24 DIAGNOSIS — F0284 Dementia in other diseases classified elsewhere, unspecified severity, with anxiety: Secondary | ICD-10-CM | POA: Diagnosis not present

## 2023-11-24 DIAGNOSIS — E785 Hyperlipidemia, unspecified: Secondary | ICD-10-CM | POA: Diagnosis not present

## 2023-11-24 DIAGNOSIS — F0283 Dementia in other diseases classified elsewhere, unspecified severity, with mood disturbance: Secondary | ICD-10-CM | POA: Diagnosis not present

## 2023-11-24 DIAGNOSIS — E119 Type 2 diabetes mellitus without complications: Secondary | ICD-10-CM | POA: Diagnosis not present

## 2023-11-24 DIAGNOSIS — Z7984 Long term (current) use of oral hypoglycemic drugs: Secondary | ICD-10-CM | POA: Diagnosis not present

## 2023-11-25 DIAGNOSIS — Z8701 Personal history of pneumonia (recurrent): Secondary | ICD-10-CM | POA: Diagnosis not present

## 2023-11-25 DIAGNOSIS — M1990 Unspecified osteoarthritis, unspecified site: Secondary | ICD-10-CM | POA: Diagnosis not present

## 2023-11-25 DIAGNOSIS — Z556 Problems related to health literacy: Secondary | ICD-10-CM | POA: Diagnosis not present

## 2023-11-25 DIAGNOSIS — Z7901 Long term (current) use of anticoagulants: Secondary | ICD-10-CM | POA: Diagnosis not present

## 2023-11-25 DIAGNOSIS — I951 Orthostatic hypotension: Secondary | ICD-10-CM | POA: Diagnosis not present

## 2023-11-25 DIAGNOSIS — Z9181 History of falling: Secondary | ICD-10-CM | POA: Diagnosis not present

## 2023-11-25 DIAGNOSIS — I480 Paroxysmal atrial fibrillation: Secondary | ICD-10-CM | POA: Diagnosis not present

## 2023-11-25 DIAGNOSIS — K5909 Other constipation: Secondary | ICD-10-CM | POA: Diagnosis not present

## 2023-11-25 DIAGNOSIS — E119 Type 2 diabetes mellitus without complications: Secondary | ICD-10-CM | POA: Diagnosis not present

## 2023-11-25 DIAGNOSIS — I110 Hypertensive heart disease with heart failure: Secondary | ICD-10-CM | POA: Diagnosis not present

## 2023-11-25 DIAGNOSIS — Z7984 Long term (current) use of oral hypoglycemic drugs: Secondary | ICD-10-CM | POA: Diagnosis not present

## 2023-11-25 DIAGNOSIS — F0283 Dementia in other diseases classified elsewhere, unspecified severity, with mood disturbance: Secondary | ICD-10-CM | POA: Diagnosis not present

## 2023-11-25 DIAGNOSIS — F329 Major depressive disorder, single episode, unspecified: Secondary | ICD-10-CM | POA: Diagnosis not present

## 2023-11-25 DIAGNOSIS — E785 Hyperlipidemia, unspecified: Secondary | ICD-10-CM | POA: Diagnosis not present

## 2023-11-25 DIAGNOSIS — I509 Heart failure, unspecified: Secondary | ICD-10-CM | POA: Diagnosis not present

## 2023-11-25 DIAGNOSIS — Z794 Long term (current) use of insulin: Secondary | ICD-10-CM | POA: Diagnosis not present

## 2023-11-25 DIAGNOSIS — E039 Hypothyroidism, unspecified: Secondary | ICD-10-CM | POA: Diagnosis not present

## 2023-11-25 DIAGNOSIS — Z8673 Personal history of transient ischemic attack (TIA), and cerebral infarction without residual deficits: Secondary | ICD-10-CM | POA: Diagnosis not present

## 2023-11-25 DIAGNOSIS — K219 Gastro-esophageal reflux disease without esophagitis: Secondary | ICD-10-CM | POA: Diagnosis not present

## 2023-11-25 DIAGNOSIS — F0284 Dementia in other diseases classified elsewhere, unspecified severity, with anxiety: Secondary | ICD-10-CM | POA: Diagnosis not present

## 2023-11-28 DIAGNOSIS — F0284 Dementia in other diseases classified elsewhere, unspecified severity, with anxiety: Secondary | ICD-10-CM | POA: Diagnosis not present

## 2023-11-28 DIAGNOSIS — M1990 Unspecified osteoarthritis, unspecified site: Secondary | ICD-10-CM | POA: Diagnosis not present

## 2023-11-28 DIAGNOSIS — Z794 Long term (current) use of insulin: Secondary | ICD-10-CM | POA: Diagnosis not present

## 2023-11-28 DIAGNOSIS — E119 Type 2 diabetes mellitus without complications: Secondary | ICD-10-CM | POA: Diagnosis not present

## 2023-11-28 DIAGNOSIS — Z7984 Long term (current) use of oral hypoglycemic drugs: Secondary | ICD-10-CM | POA: Diagnosis not present

## 2023-11-28 DIAGNOSIS — F0283 Dementia in other diseases classified elsewhere, unspecified severity, with mood disturbance: Secondary | ICD-10-CM | POA: Diagnosis not present

## 2023-11-28 DIAGNOSIS — I951 Orthostatic hypotension: Secondary | ICD-10-CM | POA: Diagnosis not present

## 2023-11-28 DIAGNOSIS — F329 Major depressive disorder, single episode, unspecified: Secondary | ICD-10-CM | POA: Diagnosis not present

## 2023-11-28 DIAGNOSIS — I110 Hypertensive heart disease with heart failure: Secondary | ICD-10-CM | POA: Diagnosis not present

## 2023-11-28 DIAGNOSIS — Z7901 Long term (current) use of anticoagulants: Secondary | ICD-10-CM | POA: Diagnosis not present

## 2023-11-28 DIAGNOSIS — Z556 Problems related to health literacy: Secondary | ICD-10-CM | POA: Diagnosis not present

## 2023-11-28 DIAGNOSIS — K5909 Other constipation: Secondary | ICD-10-CM | POA: Diagnosis not present

## 2023-11-28 DIAGNOSIS — Z8673 Personal history of transient ischemic attack (TIA), and cerebral infarction without residual deficits: Secondary | ICD-10-CM | POA: Diagnosis not present

## 2023-11-28 DIAGNOSIS — K219 Gastro-esophageal reflux disease without esophagitis: Secondary | ICD-10-CM | POA: Diagnosis not present

## 2023-11-28 DIAGNOSIS — Z9181 History of falling: Secondary | ICD-10-CM | POA: Diagnosis not present

## 2023-11-28 DIAGNOSIS — I509 Heart failure, unspecified: Secondary | ICD-10-CM | POA: Diagnosis not present

## 2023-11-28 DIAGNOSIS — I480 Paroxysmal atrial fibrillation: Secondary | ICD-10-CM | POA: Diagnosis not present

## 2023-11-28 DIAGNOSIS — E785 Hyperlipidemia, unspecified: Secondary | ICD-10-CM | POA: Diagnosis not present

## 2023-11-28 DIAGNOSIS — Z8701 Personal history of pneumonia (recurrent): Secondary | ICD-10-CM | POA: Diagnosis not present

## 2023-11-28 DIAGNOSIS — E039 Hypothyroidism, unspecified: Secondary | ICD-10-CM | POA: Diagnosis not present

## 2023-12-01 DIAGNOSIS — Z556 Problems related to health literacy: Secondary | ICD-10-CM | POA: Diagnosis not present

## 2023-12-01 DIAGNOSIS — I951 Orthostatic hypotension: Secondary | ICD-10-CM | POA: Diagnosis not present

## 2023-12-01 DIAGNOSIS — K5909 Other constipation: Secondary | ICD-10-CM | POA: Diagnosis not present

## 2023-12-01 DIAGNOSIS — I110 Hypertensive heart disease with heart failure: Secondary | ICD-10-CM | POA: Diagnosis not present

## 2023-12-01 DIAGNOSIS — I509 Heart failure, unspecified: Secondary | ICD-10-CM | POA: Diagnosis not present

## 2023-12-01 DIAGNOSIS — F0283 Dementia in other diseases classified elsewhere, unspecified severity, with mood disturbance: Secondary | ICD-10-CM | POA: Diagnosis not present

## 2023-12-01 DIAGNOSIS — E119 Type 2 diabetes mellitus without complications: Secondary | ICD-10-CM | POA: Diagnosis not present

## 2023-12-01 DIAGNOSIS — F329 Major depressive disorder, single episode, unspecified: Secondary | ICD-10-CM | POA: Diagnosis not present

## 2023-12-01 DIAGNOSIS — Z8673 Personal history of transient ischemic attack (TIA), and cerebral infarction without residual deficits: Secondary | ICD-10-CM | POA: Diagnosis not present

## 2023-12-01 DIAGNOSIS — E785 Hyperlipidemia, unspecified: Secondary | ICD-10-CM | POA: Diagnosis not present

## 2023-12-01 DIAGNOSIS — K219 Gastro-esophageal reflux disease without esophagitis: Secondary | ICD-10-CM | POA: Diagnosis not present

## 2023-12-01 DIAGNOSIS — F0284 Dementia in other diseases classified elsewhere, unspecified severity, with anxiety: Secondary | ICD-10-CM | POA: Diagnosis not present

## 2023-12-01 DIAGNOSIS — Z7984 Long term (current) use of oral hypoglycemic drugs: Secondary | ICD-10-CM | POA: Diagnosis not present

## 2023-12-01 DIAGNOSIS — I480 Paroxysmal atrial fibrillation: Secondary | ICD-10-CM | POA: Diagnosis not present

## 2023-12-01 DIAGNOSIS — E039 Hypothyroidism, unspecified: Secondary | ICD-10-CM | POA: Diagnosis not present

## 2023-12-01 DIAGNOSIS — Z8701 Personal history of pneumonia (recurrent): Secondary | ICD-10-CM | POA: Diagnosis not present

## 2023-12-01 DIAGNOSIS — Z7901 Long term (current) use of anticoagulants: Secondary | ICD-10-CM | POA: Diagnosis not present

## 2023-12-01 DIAGNOSIS — Z794 Long term (current) use of insulin: Secondary | ICD-10-CM | POA: Diagnosis not present

## 2023-12-01 DIAGNOSIS — Z9181 History of falling: Secondary | ICD-10-CM | POA: Diagnosis not present

## 2023-12-01 DIAGNOSIS — M1990 Unspecified osteoarthritis, unspecified site: Secondary | ICD-10-CM | POA: Diagnosis not present

## 2023-12-04 ENCOUNTER — Other Ambulatory Visit: Payer: Self-pay | Admitting: Neurology

## 2023-12-04 ENCOUNTER — Inpatient Hospital Stay: Payer: Medicare HMO | Attending: Hematology and Oncology

## 2023-12-04 ENCOUNTER — Telehealth: Payer: Self-pay | Admitting: *Deleted

## 2023-12-04 DIAGNOSIS — D473 Essential (hemorrhagic) thrombocythemia: Secondary | ICD-10-CM | POA: Insufficient documentation

## 2023-12-04 LAB — CBC WITH DIFFERENTIAL (CANCER CENTER ONLY)
Abs Immature Granulocytes: 0.06 10*3/uL (ref 0.00–0.07)
Basophils Absolute: 0.2 10*3/uL — ABNORMAL HIGH (ref 0.0–0.1)
Basophils Relative: 2 %
Eosinophils Absolute: 0.3 10*3/uL (ref 0.0–0.5)
Eosinophils Relative: 3 %
HCT: 33.7 % — ABNORMAL LOW (ref 39.0–52.0)
Hemoglobin: 10.8 g/dL — ABNORMAL LOW (ref 13.0–17.0)
Immature Granulocytes: 1 %
Lymphocytes Relative: 11 %
Lymphs Abs: 1.1 10*3/uL (ref 0.7–4.0)
MCH: 34.2 pg — ABNORMAL HIGH (ref 26.0–34.0)
MCHC: 32 g/dL (ref 30.0–36.0)
MCV: 106.6 fL — ABNORMAL HIGH (ref 80.0–100.0)
Monocytes Absolute: 0.6 10*3/uL (ref 0.1–1.0)
Monocytes Relative: 6 %
Neutro Abs: 7.6 10*3/uL (ref 1.7–7.7)
Neutrophils Relative %: 77 %
Platelet Count: 1389 10*3/uL (ref 150–400)
RBC: 3.16 MIL/uL — ABNORMAL LOW (ref 4.22–5.81)
RDW: 20.5 % — ABNORMAL HIGH (ref 11.5–15.5)
WBC Count: 9.8 10*3/uL (ref 4.0–10.5)
nRBC: 0.2 % (ref 0.0–0.2)

## 2023-12-04 LAB — CMP (CANCER CENTER ONLY)
ALT: 15 U/L (ref 0–44)
AST: 17 U/L (ref 15–41)
Albumin: 3.8 g/dL (ref 3.5–5.0)
Alkaline Phosphatase: 107 U/L (ref 38–126)
Anion gap: 5 (ref 5–15)
BUN: 35 mg/dL — ABNORMAL HIGH (ref 8–23)
CO2: 33 mmol/L — ABNORMAL HIGH (ref 22–32)
Calcium: 9.7 mg/dL (ref 8.9–10.3)
Chloride: 99 mmol/L (ref 98–111)
Creatinine: 1.68 mg/dL — ABNORMAL HIGH (ref 0.61–1.24)
GFR, Estimated: 41 mL/min — ABNORMAL LOW (ref >60)
Glucose, Bld: 184 mg/dL — ABNORMAL HIGH (ref 70–99)
Potassium: 4.4 mmol/L (ref 3.5–5.1)
Sodium: 137 mmol/L (ref 135–145)
Total Bilirubin: 0.6 mg/dL (ref 0.0–1.2)
Total Protein: 7.2 g/dL (ref 6.5–8.1)

## 2023-12-04 NOTE — Telephone Encounter (Signed)
 CRITICAL VALUE STICKER  CRITICAL VALUE: Platelets [id number]  RECEIVER (on-site recipient of call): Binnie Rail, RN  DATE & TIME NOTIFIED: 12/04/23  12:30 pm  MESSENGER (representative from lab): Heather  MD NOTIFIED: Dr. Leonides Schanz  TIME OF NOTIFICATION: 12:59 pm  RESPONSE:  acknowledged receipt of lab results.

## 2023-12-05 DIAGNOSIS — I509 Heart failure, unspecified: Secondary | ICD-10-CM | POA: Diagnosis not present

## 2023-12-05 DIAGNOSIS — Z8701 Personal history of pneumonia (recurrent): Secondary | ICD-10-CM | POA: Diagnosis not present

## 2023-12-05 DIAGNOSIS — E119 Type 2 diabetes mellitus without complications: Secondary | ICD-10-CM | POA: Diagnosis not present

## 2023-12-05 DIAGNOSIS — I951 Orthostatic hypotension: Secondary | ICD-10-CM | POA: Diagnosis not present

## 2023-12-05 DIAGNOSIS — F0283 Dementia in other diseases classified elsewhere, unspecified severity, with mood disturbance: Secondary | ICD-10-CM | POA: Diagnosis not present

## 2023-12-05 DIAGNOSIS — Z7984 Long term (current) use of oral hypoglycemic drugs: Secondary | ICD-10-CM | POA: Diagnosis not present

## 2023-12-05 DIAGNOSIS — K219 Gastro-esophageal reflux disease without esophagitis: Secondary | ICD-10-CM | POA: Diagnosis not present

## 2023-12-05 DIAGNOSIS — Z794 Long term (current) use of insulin: Secondary | ICD-10-CM | POA: Diagnosis not present

## 2023-12-05 DIAGNOSIS — I480 Paroxysmal atrial fibrillation: Secondary | ICD-10-CM | POA: Diagnosis not present

## 2023-12-05 DIAGNOSIS — M1990 Unspecified osteoarthritis, unspecified site: Secondary | ICD-10-CM | POA: Diagnosis not present

## 2023-12-05 DIAGNOSIS — E039 Hypothyroidism, unspecified: Secondary | ICD-10-CM | POA: Diagnosis not present

## 2023-12-05 DIAGNOSIS — Z8673 Personal history of transient ischemic attack (TIA), and cerebral infarction without residual deficits: Secondary | ICD-10-CM | POA: Diagnosis not present

## 2023-12-05 DIAGNOSIS — Z9181 History of falling: Secondary | ICD-10-CM | POA: Diagnosis not present

## 2023-12-05 DIAGNOSIS — I110 Hypertensive heart disease with heart failure: Secondary | ICD-10-CM | POA: Diagnosis not present

## 2023-12-05 DIAGNOSIS — E785 Hyperlipidemia, unspecified: Secondary | ICD-10-CM | POA: Diagnosis not present

## 2023-12-05 DIAGNOSIS — F0284 Dementia in other diseases classified elsewhere, unspecified severity, with anxiety: Secondary | ICD-10-CM | POA: Diagnosis not present

## 2023-12-05 DIAGNOSIS — F329 Major depressive disorder, single episode, unspecified: Secondary | ICD-10-CM | POA: Diagnosis not present

## 2023-12-05 DIAGNOSIS — Z7901 Long term (current) use of anticoagulants: Secondary | ICD-10-CM | POA: Diagnosis not present

## 2023-12-05 DIAGNOSIS — K5909 Other constipation: Secondary | ICD-10-CM | POA: Diagnosis not present

## 2023-12-05 DIAGNOSIS — Z556 Problems related to health literacy: Secondary | ICD-10-CM | POA: Diagnosis not present

## 2023-12-06 ENCOUNTER — Other Ambulatory Visit: Payer: Self-pay | Admitting: Cardiology

## 2023-12-06 DIAGNOSIS — F0284 Dementia in other diseases classified elsewhere, unspecified severity, with anxiety: Secondary | ICD-10-CM | POA: Diagnosis not present

## 2023-12-06 DIAGNOSIS — E119 Type 2 diabetes mellitus without complications: Secondary | ICD-10-CM | POA: Diagnosis not present

## 2023-12-06 DIAGNOSIS — K5909 Other constipation: Secondary | ICD-10-CM | POA: Diagnosis not present

## 2023-12-06 DIAGNOSIS — I480 Paroxysmal atrial fibrillation: Secondary | ICD-10-CM | POA: Diagnosis not present

## 2023-12-06 DIAGNOSIS — E785 Hyperlipidemia, unspecified: Secondary | ICD-10-CM | POA: Diagnosis not present

## 2023-12-06 DIAGNOSIS — I110 Hypertensive heart disease with heart failure: Secondary | ICD-10-CM | POA: Diagnosis not present

## 2023-12-06 DIAGNOSIS — Z794 Long term (current) use of insulin: Secondary | ICD-10-CM | POA: Diagnosis not present

## 2023-12-06 DIAGNOSIS — I509 Heart failure, unspecified: Secondary | ICD-10-CM | POA: Diagnosis not present

## 2023-12-06 DIAGNOSIS — Z8701 Personal history of pneumonia (recurrent): Secondary | ICD-10-CM | POA: Diagnosis not present

## 2023-12-06 DIAGNOSIS — Z556 Problems related to health literacy: Secondary | ICD-10-CM | POA: Diagnosis not present

## 2023-12-06 DIAGNOSIS — E039 Hypothyroidism, unspecified: Secondary | ICD-10-CM | POA: Diagnosis not present

## 2023-12-06 DIAGNOSIS — M1990 Unspecified osteoarthritis, unspecified site: Secondary | ICD-10-CM | POA: Diagnosis not present

## 2023-12-06 DIAGNOSIS — F0283 Dementia in other diseases classified elsewhere, unspecified severity, with mood disturbance: Secondary | ICD-10-CM | POA: Diagnosis not present

## 2023-12-06 DIAGNOSIS — Z9181 History of falling: Secondary | ICD-10-CM | POA: Diagnosis not present

## 2023-12-06 DIAGNOSIS — F329 Major depressive disorder, single episode, unspecified: Secondary | ICD-10-CM | POA: Diagnosis not present

## 2023-12-06 DIAGNOSIS — Z7901 Long term (current) use of anticoagulants: Secondary | ICD-10-CM | POA: Diagnosis not present

## 2023-12-06 DIAGNOSIS — Z8673 Personal history of transient ischemic attack (TIA), and cerebral infarction without residual deficits: Secondary | ICD-10-CM | POA: Diagnosis not present

## 2023-12-06 DIAGNOSIS — Z7984 Long term (current) use of oral hypoglycemic drugs: Secondary | ICD-10-CM | POA: Diagnosis not present

## 2023-12-06 DIAGNOSIS — K219 Gastro-esophageal reflux disease without esophagitis: Secondary | ICD-10-CM | POA: Diagnosis not present

## 2023-12-06 DIAGNOSIS — I951 Orthostatic hypotension: Secondary | ICD-10-CM | POA: Diagnosis not present

## 2023-12-06 NOTE — Telephone Encounter (Signed)
 Pt last saw Dr Anne Fu 10/10/23, last labs 12/04/23 Creat 1.68, age 83, weight 122.4kg, based on specified criteria pt is 82 and serum creat >1.5, pt may need dosage change. Creat has been fluctuating prev 1.37 on 11/04/23, and 1.48 on 10/24/23.  Will refill x one 3 month supply and then recalculate dosage criteria and if serum creat remains elevated will consult MD to see if dosage change appropriate.

## 2023-12-13 DIAGNOSIS — L570 Actinic keratosis: Secondary | ICD-10-CM | POA: Diagnosis not present

## 2023-12-13 DIAGNOSIS — C4442 Squamous cell carcinoma of skin of scalp and neck: Secondary | ICD-10-CM | POA: Diagnosis not present

## 2023-12-14 DIAGNOSIS — Z8701 Personal history of pneumonia (recurrent): Secondary | ICD-10-CM | POA: Diagnosis not present

## 2023-12-14 DIAGNOSIS — Z8673 Personal history of transient ischemic attack (TIA), and cerebral infarction without residual deficits: Secondary | ICD-10-CM | POA: Diagnosis not present

## 2023-12-14 DIAGNOSIS — Z7901 Long term (current) use of anticoagulants: Secondary | ICD-10-CM | POA: Diagnosis not present

## 2023-12-14 DIAGNOSIS — Z794 Long term (current) use of insulin: Secondary | ICD-10-CM | POA: Diagnosis not present

## 2023-12-14 DIAGNOSIS — Z7984 Long term (current) use of oral hypoglycemic drugs: Secondary | ICD-10-CM | POA: Diagnosis not present

## 2023-12-14 DIAGNOSIS — E119 Type 2 diabetes mellitus without complications: Secondary | ICD-10-CM | POA: Diagnosis not present

## 2023-12-14 DIAGNOSIS — F0283 Dementia in other diseases classified elsewhere, unspecified severity, with mood disturbance: Secondary | ICD-10-CM | POA: Diagnosis not present

## 2023-12-14 DIAGNOSIS — Z556 Problems related to health literacy: Secondary | ICD-10-CM | POA: Diagnosis not present

## 2023-12-14 DIAGNOSIS — K5909 Other constipation: Secondary | ICD-10-CM | POA: Diagnosis not present

## 2023-12-14 DIAGNOSIS — F329 Major depressive disorder, single episode, unspecified: Secondary | ICD-10-CM | POA: Diagnosis not present

## 2023-12-14 DIAGNOSIS — I509 Heart failure, unspecified: Secondary | ICD-10-CM | POA: Diagnosis not present

## 2023-12-14 DIAGNOSIS — I480 Paroxysmal atrial fibrillation: Secondary | ICD-10-CM | POA: Diagnosis not present

## 2023-12-14 DIAGNOSIS — Z9181 History of falling: Secondary | ICD-10-CM | POA: Diagnosis not present

## 2023-12-14 DIAGNOSIS — E785 Hyperlipidemia, unspecified: Secondary | ICD-10-CM | POA: Diagnosis not present

## 2023-12-14 DIAGNOSIS — E039 Hypothyroidism, unspecified: Secondary | ICD-10-CM | POA: Diagnosis not present

## 2023-12-14 DIAGNOSIS — F0284 Dementia in other diseases classified elsewhere, unspecified severity, with anxiety: Secondary | ICD-10-CM | POA: Diagnosis not present

## 2023-12-14 DIAGNOSIS — I110 Hypertensive heart disease with heart failure: Secondary | ICD-10-CM | POA: Diagnosis not present

## 2023-12-14 DIAGNOSIS — K219 Gastro-esophageal reflux disease without esophagitis: Secondary | ICD-10-CM | POA: Diagnosis not present

## 2023-12-14 DIAGNOSIS — M1990 Unspecified osteoarthritis, unspecified site: Secondary | ICD-10-CM | POA: Diagnosis not present

## 2023-12-14 DIAGNOSIS — I951 Orthostatic hypotension: Secondary | ICD-10-CM | POA: Diagnosis not present

## 2023-12-18 ENCOUNTER — Inpatient Hospital Stay (HOSPITAL_BASED_OUTPATIENT_CLINIC_OR_DEPARTMENT_OTHER): Payer: Medicare HMO | Admitting: Hematology and Oncology

## 2023-12-18 ENCOUNTER — Inpatient Hospital Stay: Payer: Medicare HMO | Attending: Hematology and Oncology

## 2023-12-18 ENCOUNTER — Other Ambulatory Visit: Payer: Self-pay | Admitting: Hematology and Oncology

## 2023-12-18 VITALS — BP 151/62 | HR 51 | Temp 97.2°F | Resp 15 | Wt 267.5 lb

## 2023-12-18 DIAGNOSIS — F0284 Dementia in other diseases classified elsewhere, unspecified severity, with anxiety: Secondary | ICD-10-CM | POA: Diagnosis not present

## 2023-12-18 DIAGNOSIS — I480 Paroxysmal atrial fibrillation: Secondary | ICD-10-CM | POA: Diagnosis not present

## 2023-12-18 DIAGNOSIS — D473 Essential (hemorrhagic) thrombocythemia: Secondary | ICD-10-CM | POA: Diagnosis not present

## 2023-12-18 DIAGNOSIS — Z794 Long term (current) use of insulin: Secondary | ICD-10-CM | POA: Diagnosis not present

## 2023-12-18 DIAGNOSIS — K5909 Other constipation: Secondary | ICD-10-CM | POA: Diagnosis not present

## 2023-12-18 DIAGNOSIS — Z8701 Personal history of pneumonia (recurrent): Secondary | ICD-10-CM | POA: Diagnosis not present

## 2023-12-18 DIAGNOSIS — M1990 Unspecified osteoarthritis, unspecified site: Secondary | ICD-10-CM | POA: Diagnosis not present

## 2023-12-18 DIAGNOSIS — Z9181 History of falling: Secondary | ICD-10-CM | POA: Diagnosis not present

## 2023-12-18 DIAGNOSIS — E119 Type 2 diabetes mellitus without complications: Secondary | ICD-10-CM | POA: Diagnosis not present

## 2023-12-18 DIAGNOSIS — Z7901 Long term (current) use of anticoagulants: Secondary | ICD-10-CM | POA: Insufficient documentation

## 2023-12-18 DIAGNOSIS — I509 Heart failure, unspecified: Secondary | ICD-10-CM | POA: Diagnosis not present

## 2023-12-18 DIAGNOSIS — Z8673 Personal history of transient ischemic attack (TIA), and cerebral infarction without residual deficits: Secondary | ICD-10-CM | POA: Diagnosis not present

## 2023-12-18 DIAGNOSIS — K219 Gastro-esophageal reflux disease without esophagitis: Secondary | ICD-10-CM | POA: Diagnosis not present

## 2023-12-18 DIAGNOSIS — F0283 Dementia in other diseases classified elsewhere, unspecified severity, with mood disturbance: Secondary | ICD-10-CM | POA: Diagnosis not present

## 2023-12-18 DIAGNOSIS — Z7984 Long term (current) use of oral hypoglycemic drugs: Secondary | ICD-10-CM | POA: Diagnosis not present

## 2023-12-18 DIAGNOSIS — I951 Orthostatic hypotension: Secondary | ICD-10-CM | POA: Diagnosis not present

## 2023-12-18 DIAGNOSIS — E785 Hyperlipidemia, unspecified: Secondary | ICD-10-CM | POA: Diagnosis not present

## 2023-12-18 DIAGNOSIS — E039 Hypothyroidism, unspecified: Secondary | ICD-10-CM | POA: Diagnosis not present

## 2023-12-18 DIAGNOSIS — Z556 Problems related to health literacy: Secondary | ICD-10-CM | POA: Diagnosis not present

## 2023-12-18 DIAGNOSIS — F329 Major depressive disorder, single episode, unspecified: Secondary | ICD-10-CM | POA: Diagnosis not present

## 2023-12-18 DIAGNOSIS — I110 Hypertensive heart disease with heart failure: Secondary | ICD-10-CM | POA: Diagnosis not present

## 2023-12-18 LAB — RETIC PANEL
Immature Retic Fract: 28 % — ABNORMAL HIGH (ref 2.3–15.9)
RBC.: 3.66 MIL/uL — ABNORMAL LOW (ref 4.22–5.81)
Retic Count, Absolute: 54.2 10*3/uL (ref 19.0–186.0)
Retic Ct Pct: 1.5 % (ref 0.4–3.1)
Reticulocyte Hemoglobin: 28 pg (ref >27.9)

## 2023-12-18 LAB — CBC WITH DIFFERENTIAL (CANCER CENTER ONLY)
Abs Immature Granulocytes: 0.16 10*3/uL — ABNORMAL HIGH (ref 0.00–0.07)
Basophils Absolute: 0.2 10*3/uL — ABNORMAL HIGH (ref 0.0–0.1)
Basophils Relative: 2 %
Eosinophils Absolute: 0.3 10*3/uL (ref 0.0–0.5)
Eosinophils Relative: 3 %
HCT: 35.3 % — ABNORMAL LOW (ref 39.0–52.0)
Hemoglobin: 11.5 g/dL — ABNORMAL LOW (ref 13.0–17.0)
Immature Granulocytes: 1 %
Lymphocytes Relative: 11 %
Lymphs Abs: 1.2 10*3/uL (ref 0.7–4.0)
MCH: 32 pg (ref 26.0–34.0)
MCHC: 32.6 g/dL (ref 30.0–36.0)
MCV: 98.3 fL (ref 80.0–100.0)
Monocytes Absolute: 0.8 10*3/uL (ref 0.1–1.0)
Monocytes Relative: 7 %
Neutro Abs: 8.7 10*3/uL — ABNORMAL HIGH (ref 1.7–7.7)
Neutrophils Relative %: 76 %
Platelet Count: 1447 10*3/uL (ref 150–400)
RBC: 3.59 MIL/uL — ABNORMAL LOW (ref 4.22–5.81)
RDW: 20.5 % — ABNORMAL HIGH (ref 11.5–15.5)
WBC Count: 11.4 10*3/uL — ABNORMAL HIGH (ref 4.0–10.5)
nRBC: 0.3 % — ABNORMAL HIGH (ref 0.0–0.2)

## 2023-12-18 LAB — CMP (CANCER CENTER ONLY)
ALT: 14 U/L (ref 0–44)
AST: 15 U/L (ref 15–41)
Albumin: 3.9 g/dL (ref 3.5–5.0)
Alkaline Phosphatase: 114 U/L (ref 38–126)
Anion gap: 7 (ref 5–15)
BUN: 30 mg/dL — ABNORMAL HIGH (ref 8–23)
CO2: 32 mmol/L (ref 22–32)
Calcium: 9.4 mg/dL (ref 8.9–10.3)
Chloride: 98 mmol/L (ref 98–111)
Creatinine: 1.62 mg/dL — ABNORMAL HIGH (ref 0.61–1.24)
GFR, Estimated: 43 mL/min — ABNORMAL LOW (ref >60)
Glucose, Bld: 244 mg/dL — ABNORMAL HIGH (ref 70–99)
Potassium: 4.5 mmol/L (ref 3.5–5.1)
Sodium: 137 mmol/L (ref 135–145)
Total Bilirubin: 0.4 mg/dL (ref 0.0–1.2)
Total Protein: 7.4 g/dL (ref 6.5–8.1)

## 2023-12-18 LAB — IRON AND IRON BINDING CAPACITY (CC-WL,HP ONLY)
Iron: 56 ug/dL (ref 45–182)
Saturation Ratios: 19 % (ref 17.9–39.5)
TIBC: 301 ug/dL (ref 250–450)
UIBC: 245 ug/dL (ref 117–376)

## 2023-12-18 MED ORDER — ANAGRELIDE HCL 1 MG PO CAPS: 1.0000 mg | ORAL_CAPSULE | Freq: Two times a day (BID) | ORAL | 0 refills | Status: DC

## 2023-12-19 DIAGNOSIS — E1122 Type 2 diabetes mellitus with diabetic chronic kidney disease: Secondary | ICD-10-CM | POA: Diagnosis not present

## 2023-12-19 DIAGNOSIS — I480 Paroxysmal atrial fibrillation: Secondary | ICD-10-CM | POA: Diagnosis not present

## 2023-12-19 DIAGNOSIS — N1831 Chronic kidney disease, stage 3a: Secondary | ICD-10-CM | POA: Diagnosis not present

## 2023-12-19 DIAGNOSIS — E1165 Type 2 diabetes mellitus with hyperglycemia: Secondary | ICD-10-CM | POA: Diagnosis not present

## 2023-12-19 DIAGNOSIS — I959 Hypotension, unspecified: Secondary | ICD-10-CM | POA: Diagnosis not present

## 2023-12-19 DIAGNOSIS — D72829 Elevated white blood cell count, unspecified: Secondary | ICD-10-CM | POA: Diagnosis not present

## 2023-12-19 DIAGNOSIS — I130 Hypertensive heart and chronic kidney disease with heart failure and stage 1 through stage 4 chronic kidney disease, or unspecified chronic kidney disease: Secondary | ICD-10-CM | POA: Diagnosis not present

## 2023-12-19 DIAGNOSIS — I517 Cardiomegaly: Secondary | ICD-10-CM | POA: Diagnosis not present

## 2023-12-19 DIAGNOSIS — D539 Nutritional anemia, unspecified: Secondary | ICD-10-CM | POA: Diagnosis not present

## 2023-12-19 DIAGNOSIS — T383X6A Underdosing of insulin and oral hypoglycemic [antidiabetic] drugs, initial encounter: Secondary | ICD-10-CM | POA: Diagnosis not present

## 2023-12-19 DIAGNOSIS — E113513 Type 2 diabetes mellitus with proliferative diabetic retinopathy with macular edema, bilateral: Secondary | ICD-10-CM | POA: Diagnosis not present

## 2023-12-19 DIAGNOSIS — Z8673 Personal history of transient ischemic attack (TIA), and cerebral infarction without residual deficits: Secondary | ICD-10-CM | POA: Diagnosis not present

## 2023-12-19 DIAGNOSIS — W19XXXA Unspecified fall, initial encounter: Secondary | ICD-10-CM | POA: Diagnosis not present

## 2023-12-19 DIAGNOSIS — R0902 Hypoxemia: Secondary | ICD-10-CM | POA: Diagnosis not present

## 2023-12-19 DIAGNOSIS — Z794 Long term (current) use of insulin: Secondary | ICD-10-CM | POA: Diagnosis not present

## 2023-12-19 DIAGNOSIS — R001 Bradycardia, unspecified: Secondary | ICD-10-CM | POA: Diagnosis not present

## 2023-12-19 DIAGNOSIS — Z91148 Patient's other noncompliance with medication regimen for other reason: Secondary | ICD-10-CM | POA: Diagnosis not present

## 2023-12-19 DIAGNOSIS — R251 Tremor, unspecified: Secondary | ICD-10-CM | POA: Diagnosis not present

## 2023-12-19 DIAGNOSIS — J189 Pneumonia, unspecified organism: Secondary | ICD-10-CM | POA: Diagnosis not present

## 2023-12-19 DIAGNOSIS — L89516 Pressure-induced deep tissue damage of right ankle: Secondary | ICD-10-CM | POA: Diagnosis not present

## 2023-12-19 DIAGNOSIS — J984 Other disorders of lung: Secondary | ICD-10-CM | POA: Diagnosis not present

## 2023-12-19 DIAGNOSIS — I5032 Chronic diastolic (congestive) heart failure: Secondary | ICD-10-CM | POA: Diagnosis not present

## 2023-12-19 DIAGNOSIS — Z79899 Other long term (current) drug therapy: Secondary | ICD-10-CM | POA: Diagnosis not present

## 2023-12-19 DIAGNOSIS — N179 Acute kidney failure, unspecified: Secondary | ICD-10-CM | POA: Diagnosis not present

## 2023-12-19 DIAGNOSIS — F0390 Unspecified dementia without behavioral disturbance: Secondary | ICD-10-CM | POA: Diagnosis not present

## 2023-12-19 DIAGNOSIS — D473 Essential (hemorrhagic) thrombocythemia: Secondary | ICD-10-CM | POA: Diagnosis not present

## 2023-12-19 DIAGNOSIS — Z7901 Long term (current) use of anticoagulants: Secondary | ICD-10-CM | POA: Diagnosis not present

## 2023-12-19 DIAGNOSIS — E1169 Type 2 diabetes mellitus with other specified complication: Secondary | ICD-10-CM | POA: Diagnosis not present

## 2023-12-19 DIAGNOSIS — E1142 Type 2 diabetes mellitus with diabetic polyneuropathy: Secondary | ICD-10-CM | POA: Diagnosis not present

## 2023-12-19 DIAGNOSIS — D72819 Decreased white blood cell count, unspecified: Secondary | ICD-10-CM | POA: Diagnosis not present

## 2023-12-19 DIAGNOSIS — R531 Weakness: Secondary | ICD-10-CM | POA: Diagnosis not present

## 2023-12-19 DIAGNOSIS — E860 Dehydration: Secondary | ICD-10-CM | POA: Diagnosis not present

## 2023-12-19 DIAGNOSIS — S2241XA Multiple fractures of ribs, right side, initial encounter for closed fracture: Secondary | ICD-10-CM | POA: Diagnosis not present

## 2023-12-19 DIAGNOSIS — I700 Atherosclerosis of aorta: Secondary | ICD-10-CM | POA: Diagnosis not present

## 2023-12-19 DIAGNOSIS — Z20822 Contact with and (suspected) exposure to covid-19: Secondary | ICD-10-CM | POA: Diagnosis not present

## 2023-12-19 DIAGNOSIS — D696 Thrombocytopenia, unspecified: Secondary | ICD-10-CM | POA: Diagnosis not present

## 2023-12-19 DIAGNOSIS — J181 Lobar pneumonia, unspecified organism: Secondary | ICD-10-CM | POA: Diagnosis not present

## 2023-12-19 DIAGNOSIS — F05 Delirium due to known physiological condition: Secondary | ICD-10-CM | POA: Diagnosis not present

## 2023-12-19 DIAGNOSIS — R918 Other nonspecific abnormal finding of lung field: Secondary | ICD-10-CM | POA: Diagnosis not present

## 2023-12-19 LAB — FERRITIN: Ferritin: 46 ng/mL (ref 24–336)

## 2023-12-19 NOTE — Progress Notes (Signed)
 [facility] [phone]   [phone]  PROGRESS NOTE  Patient Care Team: Adrian Prince, MD as PCP - General (Endocrinology) Lyn Records, MD (Inactive) as PCP - Cardiology (Cardiology) Hillis Range, MD (Inactive) as PCP - Electrophysiology (Cardiology) Hilarie Fredrickson, MD as Consulting Physician (Gastroenterology) Janalyn Harder, MD (Inactive) as Consulting Physician (Dermatology) Lovenia Shuck, MD as Referring Physician (Ophthalmology) Micki Riley, MD as Consulting Physician (Neurology) Jaci Standard, MD as Consulting Physician (Hematology and Oncology)  Hematological/Oncological History # 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 Darnelle Catalan. 08/08/2022: Establish care with Dr. Leonides Schanz 02/01/2023: Plt 1233, patient stopped medication x several weeks. Restarted on hydroxyurea 500 mg BID.  05/04/2023: WBC 8.3, Hgb 12.4, MCV 89.4, Plt 1082, increased hydroxyurea to 500 in the morning and 1000 at night. 10/06/2023: WBC 1.48, Hgb 9.0, Plt 333, MCV 119.7. Hydroxyurea held due to severe cytopenias.   10/24/2023: WBC 3.5, Hgb 9.6, MCV 115.9, Plt 671  Interval History:  [name] 83 y.o. male with medical history significant for essential thrombocytosis who presents for a follow up visit. The patient's last visit was on 07/31/2023. In the interim since the last visit he has had no major changes in his health.  On exam today [name] reports he has been well overall in the room since her last visit.  His energy is now up to a 7 out of 10.  He is not having any lightheadedness, dizziness, shortness of breath.  He reports that he is out of his rehab facility and back at home with his wife.  His sons also help to look after him.  He is not having any trouble with bleeding, bruising, or dark stools.  He also denies any signs or symptoms concerning for VTE such as leg pain, leg  swelling, chest pain, or shortness of breath.  Overall he feels at his baseline level of health with no major recent changes.  Otherwise he does not have any questions concerns or complaints today.  He denies any fevers, chills, sweats, nausea, vomiting or diarrhea.  A full 10 point ROS was otherwise negative.  Previously we discussed his pancytopenia and the need to continue holding his hydroxyurea until his hemoglobin and white blood cell count recovered.  Discussed options for treatment moving forward including hydroxyurea or anagrelide.  At this time I favor anagrelide.  We discussed the risks and benefits of anagrelide therapy and he was willing and able to proceed with this treatment.    MEDICAL HISTORY:  Past Medical History:  Diagnosis Date   Anxiety    Arthritis    Basal cell carcinoma 07/18/1991   Left nasal brdige (MOHS)   Basal cell carcinoma 01/23/1992   lower right back-(CX35FU)   Basal cell carcinoma 05/20/2003   sup-left back (CX35FU)   Basal cell carcinoma 07/28/2011   post lower neck   Basal cell carcinoma 08/20/2008   right sideburn(MOHS), sup-Left upper back (CX35FU), sup-mid back (CX35FU), nod-Right lower back )CX35FU), nod-right upperarm (CX35FU)   Basal cell carcinoma 06/08/2016   sup-Left upper back (CX35FU), mid back (CX35FU), right lower back (CX35FU), nod-Right upperarm (CX35FU)   Depression    Diabetes mellitus without complication (HCC)    GERD (gastroesophageal reflux disease)    Hyperlipidemia    Hypertension    Neuropathy    Obesity    Paroxysmal atrial fibrillation (HCC)    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 (in situ)   Sleep apnea     uses C-pap machine   Squamous cell carcinoma of skin 02/11/2013   in situ-Right temple (CX35FU)   Squamous cell carcinoma of skin 08/20/2008   in situ- front scalp (CX35FU)   Squamous cell carcinoma of skin 06/08/2016   in situ-front scalp (CX35FU)   Squamous cell carcinoma of skin 02/05/2019   in situ-right sideburn-sup (CX35FU), in situ-right sideburn,inf (CX35FU)   Stroke (HCC)    08/09/2017   Superficial basal cell carcinoma (BCC) 01/20/2021   Scalp    SURGICAL HISTORY: Past Surgical History:  Procedure Laterality Date   APPENDECTOMY  1962   BACK SURGERY  00-02-12   x3   BASAL CELL CARCINOMA EXCISION  93/06/10   COLONOSCOPY     KNEE ARTHROSCOPY  005/01/02   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   Procedure: TOTAL LEFT KNEE ARTHROPLASTY;  Surgeon: Ollen Gross, MD;  Location: [facility];  Service: Orthopedics;  Laterality: Left;   TOTAL KNEE ARTHROPLASTY Right 10/08/2018   Procedure: RIGHT TOTAL KNEE ARTHROPLASTY;  Surgeon: Ollen Gross, MD;  Location: [facility];  Service: Orthopedics;  Laterality: Right;     SOCIAL HISTORY: Social History   Socioeconomic History   Marital status: Married    Spouse name: Thurston Hole   Number of children: Not on file   Years of education: Not on file   Highest education level: Not on file  Occupational History   Not on file  Tobacco Use   Smoking status: Former    Current packs/day: 0.00    Average packs/day: 1 pack/day for 30.0 years (30.0 ttl pk-yrs)    Types: Cigars, Cigarettes    Start date: 08/02/1962    Quit date: 08/02/1992    Years since quitting: 31.4   Smokeless tobacco: Never  Vaping Use   Vaping status: Never Used  Substance and Sexual Activity   Alcohol use: Yes    Comment: occasionally   Drug use: No   Sexual activity: Not on file  Other Topics Concern   Not on file  Social History Narrative   Lives w wife   R handed   Caffeine: 1 C of coffee a day   Social Drivers of Corporate investment banker Strain: Not  on file  Food Insecurity: No Food Insecurity (08/28/2022)   Hunger Vital Sign    Worried About Running Out of Food in the Last Year: Never true    Ran Out of Food in the Last Year: Never true  Transportation Needs: No Transportation Needs (08/28/2022)   PRAPARE - Administrator, Civil Service (Medical): No    Lack of Transportation (Non-Medical): No  Physical Activity: Not on file  Stress: Not on file  Social Connections: Not on file  Intimate Partner Violence: Not At Risk (08/28/2022)   Humiliation, Afraid, Rape, and Kick questionnaire    Fear of Current or Ex-Partner: No    Emotionally Abused: No    Physically Abused: No    Sexually Abused: No    FAMILY HISTORY: Family History  Problem Relation Age of Onset   Cancer Mother        unknown  Diabetes Mellitus II Mother    Hypertension Mother    Heart failure Father    CVA Father    Hypertension Sister    Colon cancer Neg Hx    Pancreatic cancer Neg Hx    Esophageal cancer Neg Hx    Stomach cancer Neg Hx     ALLERGIES:  is allergic to penicillin g sodium.  MEDICATIONS:  Current Outpatient Medications  Medication Sig Dispense Refill   anagrelide (AGRYLIN) 1 MG capsule Take 1 capsule (1 mg total) by mouth 2 (two) times daily. 180 capsule 0   acetaminophen (TYLENOL) 325 MG tablet Take 2 tablets (650 mg total) by mouth every 6 (six) hours as needed for mild pain (or Fever >/= 101).     ALPRAZolam (XANAX) 0.5 MG tablet Take 0.5 mg by mouth 2 (two) times daily.     apixaban (ELIQUIS) 5 MG TABS tablet Take 1 tablet (5 mg total) by mouth 2 (two) times daily. 180 tablet 0   ascorbic acid (VITAMIN C) 500 MG tablet Take 1 tablet (500 mg total) by mouth daily. 14 tablet 0   atorvastatin (LIPITOR) 80 MG tablet Take 1 tablet (80 mg total) by mouth daily at 6 PM. (Patient taking differently: Take 80 mg by mouth at bedtime.) 30 tablet 0   benazepril (LOTENSIN) 40 MG tablet Take 1 tablet by mouth once daily for 90      ergocalciferol (VITAMIN D2) 50000 units capsule Take 50,000 Units by mouth every Sunday.     gabapentin (NEURONTIN) 600 MG tablet Take 600 mg by mouth every evening.     glucose blood (ONETOUCH ULTRA) test strip USE 1 STRIP TO CHECK GLUCOSE 4 TIMES DAILY     levothyroxine (SYNTHROID) 75 MCG tablet Take 75 mcg by mouth daily before breakfast.     Melatonin 3 MG CAPS Take 3 mg by mouth at bedtime.     metFORMIN (GLUCOPHAGE) 1000 MG tablet Take 500 mg by mouth in the morning and at bedtime.     metolazone (ZAROXOLYN) 2.5 MG tablet TAKE 1 TABLET BY MOUTH TWICE A WEEK ON TUESDAY AND FRIDAY 30 MINUTES BEFORE TAKING TORSEMIDE *NEW PRESCRIPTION REQUEST* 24 tablet 2   metoprolol tartrate (LOPRESSOR) 50 MG tablet Take 50 mg by mouth 2 (two) times daily.     Multiple Vitamin (MULTI VITAMIN DAILY PO)      Multiple Vitamin (MULTIVITAMIN WITH MINERALS) TABS tablet Take 1 tablet by mouth daily with breakfast.     NOVOLIN 70/30 RELION (70-30) 100 UNIT/ML injection Inject 44 Units into the skin in the morning and at bedtime.     omeprazole (PRILOSEC OTC) 20 MG tablet Take 20 mg by mouth daily before breakfast.     ONETOUCH VERIO test strip SMARTSIG:Via Meter     polyethylene glycol powder (MIRALAX) 17 GM/SCOOP powder Please take 6 capfuls of MiraLAX in a 16 oz bottle of Gatorade over 2-4 hour period. The following day take 3 capfuls. On day 3 start taking 1 capful 3 times a day. Slowly cut back as needed until you have normal bowel movements. (Patient taking differently: Take 17 g by mouth daily as needed for mild constipation (mix and drink as directed).) 255 g 0   potassium chloride (KLOR-CON) 10 MEQ tablet Take 10 mEq by mouth daily.     senna-docusate (SENOKOT-S) 8.6-50 MG tablet Take 2 tablets by mouth at bedtime as needed for mild constipation. 30 tablet 0   tamsulosin (FLOMAX) 0.4 MG CAPS capsule Take 0.4 mg  by mouth daily.     topiramate (TOPAMAX) 50 MG tablet TAKE 1 TABLET BY MOUTH AT BEDTIME 30 tablet 0    torsemide (DEMADEX) 20 MG tablet TAKE TWO (2) TABLETS BY MOUTH ONCE DAILY *NEW PRESCRIPTION REQUEST* 180 tablet 2   venlafaxine XR (EFFEXOR-XR) 150 MG 24 hr capsule Take 150 mg by mouth daily with breakfast.     No current facility-administered medications for this visit.    REVIEW OF SYSTEMS:   Constitutional: ( - ) fevers, ( - )  chills , ( - ) night sweats Eyes: ( - ) blurriness of vision, ( - ) double vision, ( - ) watery eyes Ears, nose, mouth, throat, and face: ( - ) mucositis, ( - ) sore throat Respiratory: ( - ) cough, ( - ) dyspnea, ( - ) wheezes Cardiovascular: ( - ) palpitation, ( - ) chest discomfort, ( - ) lower extremity swelling Gastrointestinal:  ( - ) nausea, ( - ) heartburn, ( - ) change in bowel habits Skin: ( - ) abnormal skin rashes Lymphatics: ( - ) new lymphadenopathy, ( - ) easy bruising Neurological: ( - ) numbness, ( - ) tingling, ( - ) new weaknesses Behavioral/Psych: ( - ) mood change, ( - ) new changes  All other systems were reviewed with the patient and are negative.  PHYSICAL EXAMINATION: ECOG PERFORMANCE STATUS: 1 - Symptomatic but completely ambulatory  Vitals:   12/18/23 1529  BP: (!) 151/62  Pulse: (!) 51  Resp: 15  Temp: (!) 97.2 F (36.2 C)  SpO2: 96%      Filed Weights   12/18/23 1529  Weight: 267 lb 8 oz (121.3 kg)       GENERAL: Well-appearing elderly Caucasian male alert, no distress and comfortable SKIN: skin color, texture, turgor are normal, no rashes or significant lesions EYES: conjunctiva are pink and non-injected, sclera clear LUNGS: clear to auscultation and percussion with normal breathing effort HEART: regular rate & rhythm and no murmurs and +2 bilateral lower extremity edema Musculoskeletal: no cyanosis of digits and no clubbing  PSYCH: alert & oriented x 3, fluent speech NEURO: no focal motor/sensory deficits  LABORATORY DATA:  I have reviewed the data as listed    Latest Ref Rng & Units 12/18/2023    2:57 PM  12/04/2023   12:31 PM 10/24/2023   10:01 AM  CBC  WBC 4.0 - 10.5 K/uL 11.4  9.8  3.5   Hemoglobin 13.0 - 17.0 g/dL 16.1  09.6  9.6   Hematocrit 39.0 - 52.0 % 35.3  33.7  26.9   Platelets 150 - 400 K/uL 1,447  1,389  671        Latest Ref Rng & Units 12/18/2023    2:57 PM 12/04/2023   12:31 PM 10/24/2023   10:01 AM  CMP  Glucose 70 - 99 mg/dL 045  409  811   BUN 8 - 23 mg/dL 30  35  32   Creatinine 0.61 - 1.24 mg/dL 9.14  7.82  9.56   Sodium 135 - 145 mmol/L 137  137  138   Potassium 3.5 - 5.1 mmol/L 4.5  4.4  3.9   Chloride 98 - 111 mmol/L 98  99  96   CO2 22 - 32 mmol/L 32  33  34   Calcium 8.9 - 10.3 mg/dL 9.4  9.7  9.1   Total Protein 6.5 - 8.1 g/dL 7.4  7.2  6.7   Total Bilirubin 0.0 - 1.2  mg/dL 0.4  0.6  0.8   Alkaline Phos 38 - 126 U/L 114  107  120   AST 15 - 41 U/L 15  17  20    ALT 0 - 44 U/L 14  15  20      RADIOGRAPHIC STUDIES: No results found.  ASSESSMENT & PLAN JUVENTINO PAVONE 83 y.o. male with medical history significant for essential thrombocytosis who presents for a follow up visit.   (1) essential thrombocytosis: Started on Hydrea 1000 mg a day 10/06/2017             (a) Hydrea dose increased to 1500 mg daily beginning 06/27/2019   (2) bone marrow biopsy discussed, postponed   (3) 0.7 cm right upper lung nodule incidentally noted on neck CT scans November 2018             (a) no change on repeat CT of the chest with contrast 02/28/2018             (b) chest x-ray 10/13/2018 obtained for evaluation of a cough shows no mention of a mass  # Essential Thrombocytosis JAK2 Positive.  -- Recommended holding hydroxyurea due to cytopenias.  His counts appear to have recovered, at this time I favor anagrelide 1 mg twice daily for starters (once Hgb >11 and WBC >4.0) --Labs today show white blood cell count 11.4, hemoglobin 11.5, MCV 98.3, platelets 1447 --Continue Eliquis 5 mg twice daily.  This will provide thromboprophylaxis, no need for aspirin therapy. --plan for  labs q 2 weeks with clinic visit in 2 months.   No orders of the defined types were placed in this encounter.   All questions were answered. The patient knows to call the clinic with any problems, questions or concerns.  A total of more than 30 minutes were spent on this encounter with face-to-face time and non-face-to-face time, including preparing to see the patient, ordering tests and/or medications, counseling the patient and coordination of care as outlined above.   Ulysees Barns, MD Department of Hematology/Oncology [facility] at [facility] Phone: [phone] Pager: [phone] Email: [email or web address]  12/19/2023 4:22 PM

## 2023-12-27 DIAGNOSIS — Z794 Long term (current) use of insulin: Secondary | ICD-10-CM | POA: Diagnosis not present

## 2023-12-27 DIAGNOSIS — I951 Orthostatic hypotension: Secondary | ICD-10-CM | POA: Diagnosis not present

## 2023-12-27 DIAGNOSIS — E039 Hypothyroidism, unspecified: Secondary | ICD-10-CM | POA: Diagnosis not present

## 2023-12-27 DIAGNOSIS — E785 Hyperlipidemia, unspecified: Secondary | ICD-10-CM | POA: Diagnosis not present

## 2023-12-27 DIAGNOSIS — Z8701 Personal history of pneumonia (recurrent): Secondary | ICD-10-CM | POA: Diagnosis not present

## 2023-12-27 DIAGNOSIS — I509 Heart failure, unspecified: Secondary | ICD-10-CM | POA: Diagnosis not present

## 2023-12-27 DIAGNOSIS — I480 Paroxysmal atrial fibrillation: Secondary | ICD-10-CM | POA: Diagnosis not present

## 2023-12-27 DIAGNOSIS — Z9181 History of falling: Secondary | ICD-10-CM | POA: Diagnosis not present

## 2023-12-27 DIAGNOSIS — I110 Hypertensive heart disease with heart failure: Secondary | ICD-10-CM | POA: Diagnosis not present

## 2023-12-27 DIAGNOSIS — F329 Major depressive disorder, single episode, unspecified: Secondary | ICD-10-CM | POA: Diagnosis not present

## 2023-12-27 DIAGNOSIS — Z7901 Long term (current) use of anticoagulants: Secondary | ICD-10-CM | POA: Diagnosis not present

## 2023-12-27 DIAGNOSIS — Z556 Problems related to health literacy: Secondary | ICD-10-CM | POA: Diagnosis not present

## 2023-12-27 DIAGNOSIS — F0283 Dementia in other diseases classified elsewhere, unspecified severity, with mood disturbance: Secondary | ICD-10-CM | POA: Diagnosis not present

## 2023-12-27 DIAGNOSIS — E119 Type 2 diabetes mellitus without complications: Secondary | ICD-10-CM | POA: Diagnosis not present

## 2023-12-27 DIAGNOSIS — K5909 Other constipation: Secondary | ICD-10-CM | POA: Diagnosis not present

## 2023-12-27 DIAGNOSIS — F0284 Dementia in other diseases classified elsewhere, unspecified severity, with anxiety: Secondary | ICD-10-CM | POA: Diagnosis not present

## 2023-12-27 DIAGNOSIS — M1990 Unspecified osteoarthritis, unspecified site: Secondary | ICD-10-CM | POA: Diagnosis not present

## 2023-12-27 DIAGNOSIS — Z7984 Long term (current) use of oral hypoglycemic drugs: Secondary | ICD-10-CM | POA: Diagnosis not present

## 2023-12-27 DIAGNOSIS — Z8673 Personal history of transient ischemic attack (TIA), and cerebral infarction without residual deficits: Secondary | ICD-10-CM | POA: Diagnosis not present

## 2023-12-27 DIAGNOSIS — K219 Gastro-esophageal reflux disease without esophagitis: Secondary | ICD-10-CM | POA: Diagnosis not present

## 2023-12-28 ENCOUNTER — Other Ambulatory Visit: Payer: Self-pay | Admitting: Neurology

## 2023-12-28 DIAGNOSIS — E039 Hypothyroidism, unspecified: Secondary | ICD-10-CM | POA: Diagnosis not present

## 2023-12-28 DIAGNOSIS — K219 Gastro-esophageal reflux disease without esophagitis: Secondary | ICD-10-CM | POA: Diagnosis not present

## 2023-12-28 DIAGNOSIS — F0283 Dementia in other diseases classified elsewhere, unspecified severity, with mood disturbance: Secondary | ICD-10-CM | POA: Diagnosis not present

## 2023-12-28 DIAGNOSIS — F329 Major depressive disorder, single episode, unspecified: Secondary | ICD-10-CM | POA: Diagnosis not present

## 2023-12-28 DIAGNOSIS — M1990 Unspecified osteoarthritis, unspecified site: Secondary | ICD-10-CM | POA: Diagnosis not present

## 2023-12-28 DIAGNOSIS — E785 Hyperlipidemia, unspecified: Secondary | ICD-10-CM | POA: Diagnosis not present

## 2023-12-28 DIAGNOSIS — Z7901 Long term (current) use of anticoagulants: Secondary | ICD-10-CM | POA: Diagnosis not present

## 2023-12-28 DIAGNOSIS — Z8701 Personal history of pneumonia (recurrent): Secondary | ICD-10-CM | POA: Diagnosis not present

## 2023-12-28 DIAGNOSIS — Z794 Long term (current) use of insulin: Secondary | ICD-10-CM | POA: Diagnosis not present

## 2023-12-28 DIAGNOSIS — I110 Hypertensive heart disease with heart failure: Secondary | ICD-10-CM | POA: Diagnosis not present

## 2023-12-28 DIAGNOSIS — Z7984 Long term (current) use of oral hypoglycemic drugs: Secondary | ICD-10-CM | POA: Diagnosis not present

## 2023-12-28 DIAGNOSIS — Z8673 Personal history of transient ischemic attack (TIA), and cerebral infarction without residual deficits: Secondary | ICD-10-CM | POA: Diagnosis not present

## 2023-12-28 DIAGNOSIS — Z556 Problems related to health literacy: Secondary | ICD-10-CM | POA: Diagnosis not present

## 2023-12-28 DIAGNOSIS — K5909 Other constipation: Secondary | ICD-10-CM | POA: Diagnosis not present

## 2023-12-28 DIAGNOSIS — F0284 Dementia in other diseases classified elsewhere, unspecified severity, with anxiety: Secondary | ICD-10-CM | POA: Diagnosis not present

## 2023-12-28 DIAGNOSIS — E119 Type 2 diabetes mellitus without complications: Secondary | ICD-10-CM | POA: Diagnosis not present

## 2023-12-28 DIAGNOSIS — Z9181 History of falling: Secondary | ICD-10-CM | POA: Diagnosis not present

## 2023-12-28 DIAGNOSIS — I951 Orthostatic hypotension: Secondary | ICD-10-CM | POA: Diagnosis not present

## 2023-12-28 DIAGNOSIS — I509 Heart failure, unspecified: Secondary | ICD-10-CM | POA: Diagnosis not present

## 2023-12-28 DIAGNOSIS — I480 Paroxysmal atrial fibrillation: Secondary | ICD-10-CM | POA: Diagnosis not present

## 2023-12-29 DIAGNOSIS — K5909 Other constipation: Secondary | ICD-10-CM | POA: Diagnosis not present

## 2023-12-29 DIAGNOSIS — Z8673 Personal history of transient ischemic attack (TIA), and cerebral infarction without residual deficits: Secondary | ICD-10-CM | POA: Diagnosis not present

## 2023-12-29 DIAGNOSIS — E039 Hypothyroidism, unspecified: Secondary | ICD-10-CM | POA: Diagnosis not present

## 2023-12-29 DIAGNOSIS — E785 Hyperlipidemia, unspecified: Secondary | ICD-10-CM | POA: Diagnosis not present

## 2023-12-29 DIAGNOSIS — F0283 Dementia in other diseases classified elsewhere, unspecified severity, with mood disturbance: Secondary | ICD-10-CM | POA: Diagnosis not present

## 2023-12-29 DIAGNOSIS — Z7984 Long term (current) use of oral hypoglycemic drugs: Secondary | ICD-10-CM | POA: Diagnosis not present

## 2023-12-29 DIAGNOSIS — I951 Orthostatic hypotension: Secondary | ICD-10-CM | POA: Diagnosis not present

## 2023-12-29 DIAGNOSIS — I509 Heart failure, unspecified: Secondary | ICD-10-CM | POA: Diagnosis not present

## 2023-12-29 DIAGNOSIS — F329 Major depressive disorder, single episode, unspecified: Secondary | ICD-10-CM | POA: Diagnosis not present

## 2023-12-29 DIAGNOSIS — Z9181 History of falling: Secondary | ICD-10-CM | POA: Diagnosis not present

## 2023-12-29 DIAGNOSIS — K219 Gastro-esophageal reflux disease without esophagitis: Secondary | ICD-10-CM | POA: Diagnosis not present

## 2023-12-29 DIAGNOSIS — E119 Type 2 diabetes mellitus without complications: Secondary | ICD-10-CM | POA: Diagnosis not present

## 2023-12-29 DIAGNOSIS — I110 Hypertensive heart disease with heart failure: Secondary | ICD-10-CM | POA: Diagnosis not present

## 2023-12-29 DIAGNOSIS — Z794 Long term (current) use of insulin: Secondary | ICD-10-CM | POA: Diagnosis not present

## 2023-12-29 DIAGNOSIS — Z556 Problems related to health literacy: Secondary | ICD-10-CM | POA: Diagnosis not present

## 2023-12-29 DIAGNOSIS — M1990 Unspecified osteoarthritis, unspecified site: Secondary | ICD-10-CM | POA: Diagnosis not present

## 2023-12-29 DIAGNOSIS — Z7901 Long term (current) use of anticoagulants: Secondary | ICD-10-CM | POA: Diagnosis not present

## 2023-12-29 DIAGNOSIS — I480 Paroxysmal atrial fibrillation: Secondary | ICD-10-CM | POA: Diagnosis not present

## 2023-12-29 DIAGNOSIS — F0284 Dementia in other diseases classified elsewhere, unspecified severity, with anxiety: Secondary | ICD-10-CM | POA: Diagnosis not present

## 2023-12-29 DIAGNOSIS — Z8701 Personal history of pneumonia (recurrent): Secondary | ICD-10-CM | POA: Diagnosis not present

## 2024-01-01 ENCOUNTER — Inpatient Hospital Stay

## 2024-01-01 ENCOUNTER — Telehealth: Payer: Self-pay | Admitting: *Deleted

## 2024-01-01 DIAGNOSIS — D473 Essential (hemorrhagic) thrombocythemia: Secondary | ICD-10-CM

## 2024-01-01 DIAGNOSIS — Z7901 Long term (current) use of anticoagulants: Secondary | ICD-10-CM | POA: Diagnosis not present

## 2024-01-01 LAB — CBC WITH DIFFERENTIAL (CANCER CENTER ONLY)
Abs Immature Granulocytes: 0.21 10*3/uL — ABNORMAL HIGH (ref 0.00–0.07)
Basophils Absolute: 0.3 10*3/uL — ABNORMAL HIGH (ref 0.0–0.1)
Basophils Relative: 2 %
Eosinophils Absolute: 0.4 10*3/uL (ref 0.0–0.5)
Eosinophils Relative: 3 %
HCT: 36.1 % — ABNORMAL LOW (ref 39.0–52.0)
Hemoglobin: 12 g/dL — ABNORMAL LOW (ref 13.0–17.0)
Immature Granulocytes: 2 %
Lymphocytes Relative: 9 %
Lymphs Abs: 1.1 10*3/uL (ref 0.7–4.0)
MCH: 30.2 pg (ref 26.0–34.0)
MCHC: 33.2 g/dL (ref 30.0–36.0)
MCV: 90.9 fL (ref 80.0–100.0)
Monocytes Absolute: 0.9 10*3/uL (ref 0.1–1.0)
Monocytes Relative: 7 %
Neutro Abs: 10.3 10*3/uL — ABNORMAL HIGH (ref 1.7–7.7)
Neutrophils Relative %: 77 %
Platelet Count: 974 10*3/uL (ref 150–400)
RBC: 3.97 MIL/uL — ABNORMAL LOW (ref 4.22–5.81)
RDW: 20.9 % — ABNORMAL HIGH (ref 11.5–15.5)
WBC Count: 13.2 10*3/uL — ABNORMAL HIGH (ref 4.0–10.5)
nRBC: 0.4 % — ABNORMAL HIGH (ref 0.0–0.2)

## 2024-01-01 LAB — CMP (CANCER CENTER ONLY)
ALT: 16 U/L (ref 0–44)
AST: 16 U/L (ref 15–41)
Albumin: 3.7 g/dL (ref 3.5–5.0)
Alkaline Phosphatase: 132 U/L — ABNORMAL HIGH (ref 38–126)
Anion gap: 7 (ref 5–15)
BUN: 41 mg/dL — ABNORMAL HIGH (ref 8–23)
CO2: 29 mmol/L (ref 22–32)
Calcium: 9.5 mg/dL (ref 8.9–10.3)
Chloride: 99 mmol/L (ref 98–111)
Creatinine: 1.92 mg/dL — ABNORMAL HIGH (ref 0.61–1.24)
GFR, Estimated: 35 mL/min — ABNORMAL LOW (ref >60)
Glucose, Bld: 294 mg/dL — ABNORMAL HIGH (ref 70–99)
Potassium: 4.1 mmol/L (ref 3.5–5.1)
Sodium: 135 mmol/L (ref 135–145)
Total Bilirubin: 0.4 mg/dL (ref 0.0–1.2)
Total Protein: 7.2 g/dL (ref 6.5–8.1)

## 2024-01-01 NOTE — Telephone Encounter (Signed)
-----   Message from Rogerio Clay IV sent at 01/01/2024  1:45 PM EDT ----- Please let Mr. Ruta know that his labs showed his plt 974 down from >1400.  This is an excellent result.  His other blood counts are stable.  Will plan to recheck him again on 01/15/2024 or to make sure his levels continue to improve. ----- Message ----- From: Interface, Lab In [place] Sent: 01/01/2024   1:44 PM EDT To: Ander Bame, MD

## 2024-01-01 NOTE — Telephone Encounter (Signed)
 TCT patient's son, [name], regarding lab results from today. No answer but was able to leave a detailed message on his identified phone vm. Advised that his labs showed his plt 974 down from >1400. This is an excellent result. His other blood counts are stable. Will plan to recheck him again on 01/15/2024 or to make sure his levels continue to improve. Advised to call with any questions or concerns to [phone]

## 2024-01-03 DIAGNOSIS — F0284 Dementia in other diseases classified elsewhere, unspecified severity, with anxiety: Secondary | ICD-10-CM | POA: Diagnosis not present

## 2024-01-03 DIAGNOSIS — Z8701 Personal history of pneumonia (recurrent): Secondary | ICD-10-CM | POA: Diagnosis not present

## 2024-01-03 DIAGNOSIS — I110 Hypertensive heart disease with heart failure: Secondary | ICD-10-CM | POA: Diagnosis not present

## 2024-01-03 DIAGNOSIS — I480 Paroxysmal atrial fibrillation: Secondary | ICD-10-CM | POA: Diagnosis not present

## 2024-01-03 DIAGNOSIS — E119 Type 2 diabetes mellitus without complications: Secondary | ICD-10-CM | POA: Diagnosis not present

## 2024-01-03 DIAGNOSIS — Z794 Long term (current) use of insulin: Secondary | ICD-10-CM | POA: Diagnosis not present

## 2024-01-03 DIAGNOSIS — K219 Gastro-esophageal reflux disease without esophagitis: Secondary | ICD-10-CM | POA: Diagnosis not present

## 2024-01-03 DIAGNOSIS — Z9181 History of falling: Secondary | ICD-10-CM | POA: Diagnosis not present

## 2024-01-03 DIAGNOSIS — M1990 Unspecified osteoarthritis, unspecified site: Secondary | ICD-10-CM | POA: Diagnosis not present

## 2024-01-03 DIAGNOSIS — C4442 Squamous cell carcinoma of skin of scalp and neck: Secondary | ICD-10-CM | POA: Diagnosis not present

## 2024-01-03 DIAGNOSIS — F329 Major depressive disorder, single episode, unspecified: Secondary | ICD-10-CM | POA: Diagnosis not present

## 2024-01-03 DIAGNOSIS — Z7984 Long term (current) use of oral hypoglycemic drugs: Secondary | ICD-10-CM | POA: Diagnosis not present

## 2024-01-03 DIAGNOSIS — E785 Hyperlipidemia, unspecified: Secondary | ICD-10-CM | POA: Diagnosis not present

## 2024-01-03 DIAGNOSIS — E039 Hypothyroidism, unspecified: Secondary | ICD-10-CM | POA: Diagnosis not present

## 2024-01-03 DIAGNOSIS — F0283 Dementia in other diseases classified elsewhere, unspecified severity, with mood disturbance: Secondary | ICD-10-CM | POA: Diagnosis not present

## 2024-01-03 DIAGNOSIS — I509 Heart failure, unspecified: Secondary | ICD-10-CM | POA: Diagnosis not present

## 2024-01-03 DIAGNOSIS — Z8673 Personal history of transient ischemic attack (TIA), and cerebral infarction without residual deficits: Secondary | ICD-10-CM | POA: Diagnosis not present

## 2024-01-03 DIAGNOSIS — I951 Orthostatic hypotension: Secondary | ICD-10-CM | POA: Diagnosis not present

## 2024-01-03 DIAGNOSIS — Z7901 Long term (current) use of anticoagulants: Secondary | ICD-10-CM | POA: Diagnosis not present

## 2024-01-03 DIAGNOSIS — Z556 Problems related to health literacy: Secondary | ICD-10-CM | POA: Diagnosis not present

## 2024-01-03 DIAGNOSIS — K5909 Other constipation: Secondary | ICD-10-CM | POA: Diagnosis not present

## 2024-01-04 ENCOUNTER — Telehealth: Payer: Self-pay | Admitting: *Deleted

## 2024-01-04 DIAGNOSIS — L97511 Non-pressure chronic ulcer of other part of right foot limited to breakdown of skin: Secondary | ICD-10-CM | POA: Diagnosis not present

## 2024-01-04 DIAGNOSIS — E1140 Type 2 diabetes mellitus with diabetic neuropathy, unspecified: Secondary | ICD-10-CM | POA: Diagnosis not present

## 2024-01-04 DIAGNOSIS — I130 Hypertensive heart and chronic kidney disease with heart failure and stage 1 through stage 4 chronic kidney disease, or unspecified chronic kidney disease: Secondary | ICD-10-CM | POA: Diagnosis not present

## 2024-01-04 DIAGNOSIS — Z8673 Personal history of transient ischemic attack (TIA), and cerebral infarction without residual deficits: Secondary | ICD-10-CM | POA: Diagnosis not present

## 2024-01-04 DIAGNOSIS — I480 Paroxysmal atrial fibrillation: Secondary | ICD-10-CM | POA: Diagnosis not present

## 2024-01-04 DIAGNOSIS — I5032 Chronic diastolic (congestive) heart failure: Secondary | ICD-10-CM | POA: Diagnosis not present

## 2024-01-04 DIAGNOSIS — F0390 Unspecified dementia without behavioral disturbance: Secondary | ICD-10-CM | POA: Diagnosis not present

## 2024-01-04 DIAGNOSIS — N1831 Chronic kidney disease, stage 3a: Secondary | ICD-10-CM | POA: Diagnosis not present

## 2024-01-04 DIAGNOSIS — J159 Unspecified bacterial pneumonia: Secondary | ICD-10-CM | POA: Diagnosis not present

## 2024-01-04 DIAGNOSIS — Z794 Long term (current) use of insulin: Secondary | ICD-10-CM | POA: Diagnosis not present

## 2024-01-04 DIAGNOSIS — D473 Essential (hemorrhagic) thrombocythemia: Secondary | ICD-10-CM | POA: Diagnosis not present

## 2024-01-04 MED ORDER — APIXABAN 2.5 MG PO TABS: 2.5000 mg | ORAL_TABLET | Freq: Two times a day (BID) | ORAL | 1 refills | Status: DC

## 2024-01-04 NOTE — Telephone Encounter (Signed)
 Called and spoke to pt's wife and made her aware that pt's Eliquis  dose was changing to Eliquis  2.5 mg BID from Eliquis  5mg  BID. Pt's wife, Rexene Catching verbalized understanding. Refill sent.

## 2024-01-04 NOTE — Telephone Encounter (Signed)
 Prescription refill request for Eliquis  received.  Indication: afib  Last office visit: [place], 10/10/2023 Scr: 1.92, 01/01/2024 Age: 83 yo  Weight: 121.3 kg    Per dosing criteria pt qualifies for a dose change.   Pt would like refill sent to the [facility] pharmacy on [address].

## 2024-01-05 ENCOUNTER — Telehealth: Payer: Self-pay | Admitting: *Deleted

## 2024-01-05 DIAGNOSIS — F0283 Dementia in other diseases classified elsewhere, unspecified severity, with mood disturbance: Secondary | ICD-10-CM | POA: Diagnosis not present

## 2024-01-05 DIAGNOSIS — Z8673 Personal history of transient ischemic attack (TIA), and cerebral infarction without residual deficits: Secondary | ICD-10-CM | POA: Diagnosis not present

## 2024-01-05 DIAGNOSIS — I509 Heart failure, unspecified: Secondary | ICD-10-CM | POA: Diagnosis not present

## 2024-01-05 DIAGNOSIS — E039 Hypothyroidism, unspecified: Secondary | ICD-10-CM | POA: Diagnosis not present

## 2024-01-05 DIAGNOSIS — I951 Orthostatic hypotension: Secondary | ICD-10-CM | POA: Diagnosis not present

## 2024-01-05 DIAGNOSIS — Z7901 Long term (current) use of anticoagulants: Secondary | ICD-10-CM | POA: Diagnosis not present

## 2024-01-05 DIAGNOSIS — M1990 Unspecified osteoarthritis, unspecified site: Secondary | ICD-10-CM | POA: Diagnosis not present

## 2024-01-05 DIAGNOSIS — F0284 Dementia in other diseases classified elsewhere, unspecified severity, with anxiety: Secondary | ICD-10-CM | POA: Diagnosis not present

## 2024-01-05 DIAGNOSIS — Z794 Long term (current) use of insulin: Secondary | ICD-10-CM | POA: Diagnosis not present

## 2024-01-05 DIAGNOSIS — I480 Paroxysmal atrial fibrillation: Secondary | ICD-10-CM | POA: Diagnosis not present

## 2024-01-05 DIAGNOSIS — E785 Hyperlipidemia, unspecified: Secondary | ICD-10-CM | POA: Diagnosis not present

## 2024-01-05 DIAGNOSIS — K219 Gastro-esophageal reflux disease without esophagitis: Secondary | ICD-10-CM | POA: Diagnosis not present

## 2024-01-05 DIAGNOSIS — I110 Hypertensive heart disease with heart failure: Secondary | ICD-10-CM | POA: Diagnosis not present

## 2024-01-05 DIAGNOSIS — Z8701 Personal history of pneumonia (recurrent): Secondary | ICD-10-CM | POA: Diagnosis not present

## 2024-01-05 DIAGNOSIS — Z9181 History of falling: Secondary | ICD-10-CM | POA: Diagnosis not present

## 2024-01-05 DIAGNOSIS — Z556 Problems related to health literacy: Secondary | ICD-10-CM | POA: Diagnosis not present

## 2024-01-05 DIAGNOSIS — F329 Major depressive disorder, single episode, unspecified: Secondary | ICD-10-CM | POA: Diagnosis not present

## 2024-01-05 DIAGNOSIS — K5909 Other constipation: Secondary | ICD-10-CM | POA: Diagnosis not present

## 2024-01-05 DIAGNOSIS — Z7984 Long term (current) use of oral hypoglycemic drugs: Secondary | ICD-10-CM | POA: Diagnosis not present

## 2024-01-05 DIAGNOSIS — E119 Type 2 diabetes mellitus without complications: Secondary | ICD-10-CM | POA: Diagnosis not present

## 2024-01-05 NOTE — Telephone Encounter (Signed)
 Received vm message from pt's son , [name]. He is seeking clarification regarding his father's medication for his essential thrombocytosis. TCT [place]. Spoke with him. Advised that his dad's platelt medication has been changed to Anagrelide  1 mg 2 x a day in the hopes that his HGB will not be as affected by this medication. His HGB dropped on the Hydroxyrea. [name] voiced understanding.

## 2024-01-08 ENCOUNTER — Telehealth: Payer: Self-pay | Admitting: Cardiology

## 2024-01-08 ENCOUNTER — Telehealth: Payer: Self-pay | Admitting: Pharmacy Technician

## 2024-01-08 DIAGNOSIS — Z8673 Personal history of transient ischemic attack (TIA), and cerebral infarction without residual deficits: Secondary | ICD-10-CM | POA: Diagnosis not present

## 2024-01-08 DIAGNOSIS — Z794 Long term (current) use of insulin: Secondary | ICD-10-CM | POA: Diagnosis not present

## 2024-01-08 DIAGNOSIS — K219 Gastro-esophageal reflux disease without esophagitis: Secondary | ICD-10-CM | POA: Diagnosis not present

## 2024-01-08 DIAGNOSIS — F329 Major depressive disorder, single episode, unspecified: Secondary | ICD-10-CM | POA: Diagnosis not present

## 2024-01-08 DIAGNOSIS — C4490 Unspecified malignant neoplasm of skin, unspecified: Secondary | ICD-10-CM | POA: Diagnosis not present

## 2024-01-08 DIAGNOSIS — F0283 Dementia in other diseases classified elsewhere, unspecified severity, with mood disturbance: Secondary | ICD-10-CM | POA: Diagnosis not present

## 2024-01-08 DIAGNOSIS — I509 Heart failure, unspecified: Secondary | ICD-10-CM | POA: Diagnosis not present

## 2024-01-08 DIAGNOSIS — I130 Hypertensive heart and chronic kidney disease with heart failure and stage 1 through stage 4 chronic kidney disease, or unspecified chronic kidney disease: Secondary | ICD-10-CM | POA: Diagnosis not present

## 2024-01-08 DIAGNOSIS — M1990 Unspecified osteoarthritis, unspecified site: Secondary | ICD-10-CM | POA: Diagnosis not present

## 2024-01-08 DIAGNOSIS — I480 Paroxysmal atrial fibrillation: Secondary | ICD-10-CM | POA: Diagnosis not present

## 2024-01-08 DIAGNOSIS — Z483 Aftercare following surgery for neoplasm: Secondary | ICD-10-CM | POA: Diagnosis not present

## 2024-01-08 DIAGNOSIS — I951 Orthostatic hypotension: Secondary | ICD-10-CM | POA: Diagnosis not present

## 2024-01-08 DIAGNOSIS — K5909 Other constipation: Secondary | ICD-10-CM | POA: Diagnosis not present

## 2024-01-08 DIAGNOSIS — E785 Hyperlipidemia, unspecified: Secondary | ICD-10-CM | POA: Diagnosis not present

## 2024-01-08 DIAGNOSIS — Z7984 Long term (current) use of oral hypoglycemic drugs: Secondary | ICD-10-CM | POA: Diagnosis not present

## 2024-01-08 DIAGNOSIS — F0284 Dementia in other diseases classified elsewhere, unspecified severity, with anxiety: Secondary | ICD-10-CM | POA: Diagnosis not present

## 2024-01-08 DIAGNOSIS — E039 Hypothyroidism, unspecified: Secondary | ICD-10-CM | POA: Diagnosis not present

## 2024-01-08 DIAGNOSIS — N1831 Chronic kidney disease, stage 3a: Secondary | ICD-10-CM | POA: Diagnosis not present

## 2024-01-08 DIAGNOSIS — D473 Essential (hemorrhagic) thrombocythemia: Secondary | ICD-10-CM | POA: Diagnosis not present

## 2024-01-08 DIAGNOSIS — J189 Pneumonia, unspecified organism: Secondary | ICD-10-CM | POA: Diagnosis not present

## 2024-01-08 DIAGNOSIS — Z9181 History of falling: Secondary | ICD-10-CM | POA: Diagnosis not present

## 2024-01-08 DIAGNOSIS — Z7901 Long term (current) use of anticoagulants: Secondary | ICD-10-CM | POA: Diagnosis not present

## 2024-01-08 DIAGNOSIS — E1122 Type 2 diabetes mellitus with diabetic chronic kidney disease: Secondary | ICD-10-CM | POA: Diagnosis not present

## 2024-01-08 MED ORDER — APIXABAN 2.5 MG PO TABS: 2.5000 mg | ORAL_TABLET | Freq: Two times a day (BID) | ORAL | Status: AC

## 2024-01-08 NOTE — Telephone Encounter (Signed)
 Pt c/o medication issue:  1. Name of Medication: apixaban  (ELIQUIS ) 2.5 MG TABS tablet   2. How are you currently taking this medication (dosage and times per day)? Take 1 tablet (2.5 mg total) by mouth 2 (two) times daily.   3. Are you having a reaction (difficulty breathing--STAT)? No  4. What is your medication issue? Patient's spouse called stating the cost of Eliquis  is unaffordable. Patient's spouse would like to know if there is other options available for the patient. Please advise.

## 2024-01-08 NOTE — Telephone Encounter (Signed)
 Leaving pt 2 boxes of Elquis 2.5 mg tablets at [employer] front desk for pt to pick up. FYI

## 2024-01-08 NOTE — Telephone Encounter (Signed)
 PAP: Patient assistance application for Eliquis  through [employer] has been mailed to pt's home address on file. Provider portion of application will be faxed to provider's office.   I tried to call the patient but vm full

## 2024-01-08 NOTE — Telephone Encounter (Signed)
 Spoke to patient's wife.Advised patient assistance application will be mailed to your home.Advised samples will be left at front desk of 5th floor.

## 2024-01-08 NOTE — Telephone Encounter (Signed)
 Spoke to patient's wife she stated husband cannot afford Eliquis .Stated he has not taken any in the past 2 weeks.Advised I will send message to patient assistance team to help apply for patient assistance.

## 2024-01-09 DIAGNOSIS — C4490 Unspecified malignant neoplasm of skin, unspecified: Secondary | ICD-10-CM | POA: Diagnosis not present

## 2024-01-09 DIAGNOSIS — I951 Orthostatic hypotension: Secondary | ICD-10-CM | POA: Diagnosis not present

## 2024-01-09 DIAGNOSIS — F329 Major depressive disorder, single episode, unspecified: Secondary | ICD-10-CM | POA: Diagnosis not present

## 2024-01-09 DIAGNOSIS — M1990 Unspecified osteoarthritis, unspecified site: Secondary | ICD-10-CM | POA: Diagnosis not present

## 2024-01-09 DIAGNOSIS — Z7984 Long term (current) use of oral hypoglycemic drugs: Secondary | ICD-10-CM | POA: Diagnosis not present

## 2024-01-09 DIAGNOSIS — Z7901 Long term (current) use of anticoagulants: Secondary | ICD-10-CM | POA: Diagnosis not present

## 2024-01-09 DIAGNOSIS — J189 Pneumonia, unspecified organism: Secondary | ICD-10-CM | POA: Diagnosis not present

## 2024-01-09 DIAGNOSIS — K5909 Other constipation: Secondary | ICD-10-CM | POA: Diagnosis not present

## 2024-01-09 DIAGNOSIS — K219 Gastro-esophageal reflux disease without esophagitis: Secondary | ICD-10-CM | POA: Diagnosis not present

## 2024-01-09 DIAGNOSIS — I480 Paroxysmal atrial fibrillation: Secondary | ICD-10-CM | POA: Diagnosis not present

## 2024-01-09 DIAGNOSIS — E039 Hypothyroidism, unspecified: Secondary | ICD-10-CM | POA: Diagnosis not present

## 2024-01-09 DIAGNOSIS — E1122 Type 2 diabetes mellitus with diabetic chronic kidney disease: Secondary | ICD-10-CM | POA: Diagnosis not present

## 2024-01-09 DIAGNOSIS — Z483 Aftercare following surgery for neoplasm: Secondary | ICD-10-CM | POA: Diagnosis not present

## 2024-01-09 DIAGNOSIS — I130 Hypertensive heart and chronic kidney disease with heart failure and stage 1 through stage 4 chronic kidney disease, or unspecified chronic kidney disease: Secondary | ICD-10-CM | POA: Diagnosis not present

## 2024-01-09 DIAGNOSIS — D473 Essential (hemorrhagic) thrombocythemia: Secondary | ICD-10-CM | POA: Diagnosis not present

## 2024-01-09 DIAGNOSIS — F0283 Dementia in other diseases classified elsewhere, unspecified severity, with mood disturbance: Secondary | ICD-10-CM | POA: Diagnosis not present

## 2024-01-09 DIAGNOSIS — N1831 Chronic kidney disease, stage 3a: Secondary | ICD-10-CM | POA: Diagnosis not present

## 2024-01-09 DIAGNOSIS — F0284 Dementia in other diseases classified elsewhere, unspecified severity, with anxiety: Secondary | ICD-10-CM | POA: Diagnosis not present

## 2024-01-09 DIAGNOSIS — Z9181 History of falling: Secondary | ICD-10-CM | POA: Diagnosis not present

## 2024-01-09 DIAGNOSIS — Z794 Long term (current) use of insulin: Secondary | ICD-10-CM | POA: Diagnosis not present

## 2024-01-09 DIAGNOSIS — Z8673 Personal history of transient ischemic attack (TIA), and cerebral infarction without residual deficits: Secondary | ICD-10-CM | POA: Diagnosis not present

## 2024-01-09 DIAGNOSIS — E785 Hyperlipidemia, unspecified: Secondary | ICD-10-CM | POA: Diagnosis not present

## 2024-01-09 DIAGNOSIS — I509 Heart failure, unspecified: Secondary | ICD-10-CM | POA: Diagnosis not present

## 2024-01-10 DIAGNOSIS — M1612 Unilateral primary osteoarthritis, left hip: Secondary | ICD-10-CM | POA: Diagnosis not present

## 2024-01-11 DIAGNOSIS — Z7901 Long term (current) use of anticoagulants: Secondary | ICD-10-CM | POA: Diagnosis not present

## 2024-01-11 DIAGNOSIS — E785 Hyperlipidemia, unspecified: Secondary | ICD-10-CM | POA: Diagnosis not present

## 2024-01-11 DIAGNOSIS — E039 Hypothyroidism, unspecified: Secondary | ICD-10-CM | POA: Diagnosis not present

## 2024-01-11 DIAGNOSIS — Z9181 History of falling: Secondary | ICD-10-CM | POA: Diagnosis not present

## 2024-01-11 DIAGNOSIS — I951 Orthostatic hypotension: Secondary | ICD-10-CM | POA: Diagnosis not present

## 2024-01-11 DIAGNOSIS — N1831 Chronic kidney disease, stage 3a: Secondary | ICD-10-CM | POA: Diagnosis not present

## 2024-01-11 DIAGNOSIS — I509 Heart failure, unspecified: Secondary | ICD-10-CM | POA: Diagnosis not present

## 2024-01-11 DIAGNOSIS — J189 Pneumonia, unspecified organism: Secondary | ICD-10-CM | POA: Diagnosis not present

## 2024-01-11 DIAGNOSIS — K219 Gastro-esophageal reflux disease without esophagitis: Secondary | ICD-10-CM | POA: Diagnosis not present

## 2024-01-11 DIAGNOSIS — F329 Major depressive disorder, single episode, unspecified: Secondary | ICD-10-CM | POA: Diagnosis not present

## 2024-01-11 DIAGNOSIS — M1990 Unspecified osteoarthritis, unspecified site: Secondary | ICD-10-CM | POA: Diagnosis not present

## 2024-01-11 DIAGNOSIS — Z7984 Long term (current) use of oral hypoglycemic drugs: Secondary | ICD-10-CM | POA: Diagnosis not present

## 2024-01-11 DIAGNOSIS — I480 Paroxysmal atrial fibrillation: Secondary | ICD-10-CM | POA: Diagnosis not present

## 2024-01-11 DIAGNOSIS — K5909 Other constipation: Secondary | ICD-10-CM | POA: Diagnosis not present

## 2024-01-11 DIAGNOSIS — F0284 Dementia in other diseases classified elsewhere, unspecified severity, with anxiety: Secondary | ICD-10-CM | POA: Diagnosis not present

## 2024-01-11 DIAGNOSIS — Z794 Long term (current) use of insulin: Secondary | ICD-10-CM | POA: Diagnosis not present

## 2024-01-11 DIAGNOSIS — I130 Hypertensive heart and chronic kidney disease with heart failure and stage 1 through stage 4 chronic kidney disease, or unspecified chronic kidney disease: Secondary | ICD-10-CM | POA: Diagnosis not present

## 2024-01-11 DIAGNOSIS — F0283 Dementia in other diseases classified elsewhere, unspecified severity, with mood disturbance: Secondary | ICD-10-CM | POA: Diagnosis not present

## 2024-01-11 DIAGNOSIS — Z483 Aftercare following surgery for neoplasm: Secondary | ICD-10-CM | POA: Diagnosis not present

## 2024-01-11 DIAGNOSIS — E1122 Type 2 diabetes mellitus with diabetic chronic kidney disease: Secondary | ICD-10-CM | POA: Diagnosis not present

## 2024-01-11 DIAGNOSIS — C4490 Unspecified malignant neoplasm of skin, unspecified: Secondary | ICD-10-CM | POA: Diagnosis not present

## 2024-01-11 DIAGNOSIS — D473 Essential (hemorrhagic) thrombocythemia: Secondary | ICD-10-CM | POA: Diagnosis not present

## 2024-01-11 DIAGNOSIS — Z8673 Personal history of transient ischemic attack (TIA), and cerebral infarction without residual deficits: Secondary | ICD-10-CM | POA: Diagnosis not present

## 2024-01-12 ENCOUNTER — Other Ambulatory Visit: Payer: Self-pay | Admitting: Hematology and Oncology

## 2024-01-12 DIAGNOSIS — D473 Essential (hemorrhagic) thrombocythemia: Secondary | ICD-10-CM

## 2024-01-15 ENCOUNTER — Inpatient Hospital Stay: Attending: Hematology and Oncology

## 2024-01-15 DIAGNOSIS — D473 Essential (hemorrhagic) thrombocythemia: Secondary | ICD-10-CM

## 2024-01-15 DIAGNOSIS — D472 Monoclonal gammopathy: Secondary | ICD-10-CM | POA: Insufficient documentation

## 2024-01-15 LAB — CMP (CANCER CENTER ONLY)
ALT: 16 U/L (ref 0–44)
AST: 17 U/L (ref 15–41)
Albumin: 4.1 g/dL (ref 3.5–5.0)
Alkaline Phosphatase: 116 U/L (ref 38–126)
Anion gap: 10 (ref 5–15)
BUN: 55 mg/dL — ABNORMAL HIGH (ref 8–23)
CO2: 27 mmol/L (ref 22–32)
Calcium: 9.5 mg/dL (ref 8.9–10.3)
Chloride: 94 mmol/L — ABNORMAL LOW (ref 98–111)
Creatinine: 3.34 mg/dL — ABNORMAL HIGH (ref 0.61–1.24)
GFR, Estimated: 18 mL/min — ABNORMAL LOW (ref >60)
Glucose, Bld: 402 mg/dL — ABNORMAL HIGH (ref 70–99)
Potassium: 3.6 mmol/L (ref 3.5–5.1)
Sodium: 131 mmol/L — ABNORMAL LOW (ref 135–145)
Total Bilirubin: 0.4 mg/dL (ref 0.0–1.2)
Total Protein: 7.7 g/dL (ref 6.5–8.1)

## 2024-01-15 LAB — CBC WITH DIFFERENTIAL (CANCER CENTER ONLY)
Abs Immature Granulocytes: 0.12 10*3/uL — ABNORMAL HIGH (ref 0.00–0.07)
Basophils Absolute: 0.2 10*3/uL — ABNORMAL HIGH (ref 0.0–0.1)
Basophils Relative: 2 %
Eosinophils Absolute: 0.6 10*3/uL — ABNORMAL HIGH (ref 0.0–0.5)
Eosinophils Relative: 4 %
HCT: 39.3 % (ref 39.0–52.0)
Hemoglobin: 12.9 g/dL — ABNORMAL LOW (ref 13.0–17.0)
Immature Granulocytes: 1 %
Lymphocytes Relative: 7 %
Lymphs Abs: 1 10*3/uL (ref 0.7–4.0)
MCH: 28.4 pg (ref 26.0–34.0)
MCHC: 32.8 g/dL (ref 30.0–36.0)
MCV: 86.6 fL (ref 80.0–100.0)
Monocytes Absolute: 1 10*3/uL (ref 0.1–1.0)
Monocytes Relative: 7 %
Neutro Abs: 11.2 10*3/uL — ABNORMAL HIGH (ref 1.7–7.7)
Neutrophils Relative %: 79 %
Platelet Count: 1103 10*3/uL (ref 150–400)
RBC: 4.54 MIL/uL (ref 4.22–5.81)
RDW: 21.6 % — ABNORMAL HIGH (ref 11.5–15.5)
WBC Count: 14.2 10*3/uL — ABNORMAL HIGH (ref 4.0–10.5)
nRBC: 0 % (ref 0.0–0.2)

## 2024-01-15 NOTE — Progress Notes (Signed)
 CRITICAL VALUE STICKER  CRITICAL VALUE: Plts 1103  RECEIVER (on-site recipient of call): Costella Dirks, RN  DATE & TIME NOTIFIED: 01/15/24 12:08  MD NOTIFIED: [place] [facility]  TIME OF NOTIFICATION: 12:12

## 2024-01-16 ENCOUNTER — Other Ambulatory Visit: Payer: Self-pay | Admitting: Physician Assistant

## 2024-01-17 ENCOUNTER — Telehealth: Payer: Self-pay | Admitting: *Deleted

## 2024-01-17 DIAGNOSIS — F0283 Dementia in other diseases classified elsewhere, unspecified severity, with mood disturbance: Secondary | ICD-10-CM | POA: Diagnosis not present

## 2024-01-17 DIAGNOSIS — Z9181 History of falling: Secondary | ICD-10-CM | POA: Diagnosis not present

## 2024-01-17 DIAGNOSIS — Z7901 Long term (current) use of anticoagulants: Secondary | ICD-10-CM | POA: Diagnosis not present

## 2024-01-17 DIAGNOSIS — I509 Heart failure, unspecified: Secondary | ICD-10-CM | POA: Diagnosis not present

## 2024-01-17 DIAGNOSIS — F329 Major depressive disorder, single episode, unspecified: Secondary | ICD-10-CM | POA: Diagnosis not present

## 2024-01-17 DIAGNOSIS — I951 Orthostatic hypotension: Secondary | ICD-10-CM | POA: Diagnosis not present

## 2024-01-17 DIAGNOSIS — E039 Hypothyroidism, unspecified: Secondary | ICD-10-CM | POA: Diagnosis not present

## 2024-01-17 DIAGNOSIS — E1122 Type 2 diabetes mellitus with diabetic chronic kidney disease: Secondary | ICD-10-CM | POA: Diagnosis not present

## 2024-01-17 DIAGNOSIS — I130 Hypertensive heart and chronic kidney disease with heart failure and stage 1 through stage 4 chronic kidney disease, or unspecified chronic kidney disease: Secondary | ICD-10-CM | POA: Diagnosis not present

## 2024-01-17 DIAGNOSIS — K5909 Other constipation: Secondary | ICD-10-CM | POA: Diagnosis not present

## 2024-01-17 DIAGNOSIS — J189 Pneumonia, unspecified organism: Secondary | ICD-10-CM | POA: Diagnosis not present

## 2024-01-17 DIAGNOSIS — Z483 Aftercare following surgery for neoplasm: Secondary | ICD-10-CM | POA: Diagnosis not present

## 2024-01-17 DIAGNOSIS — I480 Paroxysmal atrial fibrillation: Secondary | ICD-10-CM | POA: Diagnosis not present

## 2024-01-17 DIAGNOSIS — Z8673 Personal history of transient ischemic attack (TIA), and cerebral infarction without residual deficits: Secondary | ICD-10-CM | POA: Diagnosis not present

## 2024-01-17 DIAGNOSIS — K219 Gastro-esophageal reflux disease without esophagitis: Secondary | ICD-10-CM | POA: Diagnosis not present

## 2024-01-17 DIAGNOSIS — F0284 Dementia in other diseases classified elsewhere, unspecified severity, with anxiety: Secondary | ICD-10-CM | POA: Diagnosis not present

## 2024-01-17 DIAGNOSIS — E785 Hyperlipidemia, unspecified: Secondary | ICD-10-CM | POA: Diagnosis not present

## 2024-01-17 DIAGNOSIS — N1831 Chronic kidney disease, stage 3a: Secondary | ICD-10-CM | POA: Diagnosis not present

## 2024-01-17 DIAGNOSIS — C4490 Unspecified malignant neoplasm of skin, unspecified: Secondary | ICD-10-CM | POA: Diagnosis not present

## 2024-01-17 DIAGNOSIS — M1990 Unspecified osteoarthritis, unspecified site: Secondary | ICD-10-CM | POA: Diagnosis not present

## 2024-01-17 DIAGNOSIS — D473 Essential (hemorrhagic) thrombocythemia: Secondary | ICD-10-CM | POA: Diagnosis not present

## 2024-01-17 DIAGNOSIS — Z7984 Long term (current) use of oral hypoglycemic drugs: Secondary | ICD-10-CM | POA: Diagnosis not present

## 2024-01-17 DIAGNOSIS — Z794 Long term (current) use of insulin: Secondary | ICD-10-CM | POA: Diagnosis not present

## 2024-01-17 NOTE — Telephone Encounter (Signed)
 Attempted to call pt's wife again. No answer . Able to lvm for her to call back today @ [phone]

## 2024-01-17 NOTE — Telephone Encounter (Signed)
 TCT pt's wife tom review recent lab results and to discuss/instruct on new medication changes related to his Anagrelide .

## 2024-01-18 NOTE — Telephone Encounter (Signed)
#  rd attempt to call pt's wife, Rexene Catching. No answer but was able to leave vm message for her to return this call today @ [phone]

## 2024-01-19 DIAGNOSIS — I951 Orthostatic hypotension: Secondary | ICD-10-CM | POA: Diagnosis not present

## 2024-01-19 DIAGNOSIS — Z7901 Long term (current) use of anticoagulants: Secondary | ICD-10-CM | POA: Diagnosis not present

## 2024-01-19 DIAGNOSIS — N1831 Chronic kidney disease, stage 3a: Secondary | ICD-10-CM | POA: Diagnosis not present

## 2024-01-19 DIAGNOSIS — M1990 Unspecified osteoarthritis, unspecified site: Secondary | ICD-10-CM | POA: Diagnosis not present

## 2024-01-19 DIAGNOSIS — F329 Major depressive disorder, single episode, unspecified: Secondary | ICD-10-CM | POA: Diagnosis not present

## 2024-01-19 DIAGNOSIS — E785 Hyperlipidemia, unspecified: Secondary | ICD-10-CM | POA: Diagnosis not present

## 2024-01-19 DIAGNOSIS — I130 Hypertensive heart and chronic kidney disease with heart failure and stage 1 through stage 4 chronic kidney disease, or unspecified chronic kidney disease: Secondary | ICD-10-CM | POA: Diagnosis not present

## 2024-01-19 DIAGNOSIS — Z9181 History of falling: Secondary | ICD-10-CM | POA: Diagnosis not present

## 2024-01-19 DIAGNOSIS — I480 Paroxysmal atrial fibrillation: Secondary | ICD-10-CM | POA: Diagnosis not present

## 2024-01-19 DIAGNOSIS — J189 Pneumonia, unspecified organism: Secondary | ICD-10-CM | POA: Diagnosis not present

## 2024-01-19 DIAGNOSIS — I509 Heart failure, unspecified: Secondary | ICD-10-CM | POA: Diagnosis not present

## 2024-01-19 DIAGNOSIS — F0283 Dementia in other diseases classified elsewhere, unspecified severity, with mood disturbance: Secondary | ICD-10-CM | POA: Diagnosis not present

## 2024-01-19 DIAGNOSIS — K5909 Other constipation: Secondary | ICD-10-CM | POA: Diagnosis not present

## 2024-01-19 DIAGNOSIS — Z7984 Long term (current) use of oral hypoglycemic drugs: Secondary | ICD-10-CM | POA: Diagnosis not present

## 2024-01-19 DIAGNOSIS — Z483 Aftercare following surgery for neoplasm: Secondary | ICD-10-CM | POA: Diagnosis not present

## 2024-01-19 DIAGNOSIS — D473 Essential (hemorrhagic) thrombocythemia: Secondary | ICD-10-CM | POA: Diagnosis not present

## 2024-01-19 DIAGNOSIS — K219 Gastro-esophageal reflux disease without esophagitis: Secondary | ICD-10-CM | POA: Diagnosis not present

## 2024-01-19 DIAGNOSIS — C4490 Unspecified malignant neoplasm of skin, unspecified: Secondary | ICD-10-CM | POA: Diagnosis not present

## 2024-01-19 DIAGNOSIS — Z8673 Personal history of transient ischemic attack (TIA), and cerebral infarction without residual deficits: Secondary | ICD-10-CM | POA: Diagnosis not present

## 2024-01-19 DIAGNOSIS — E1122 Type 2 diabetes mellitus with diabetic chronic kidney disease: Secondary | ICD-10-CM | POA: Diagnosis not present

## 2024-01-19 DIAGNOSIS — E039 Hypothyroidism, unspecified: Secondary | ICD-10-CM | POA: Diagnosis not present

## 2024-01-19 DIAGNOSIS — Z794 Long term (current) use of insulin: Secondary | ICD-10-CM | POA: Diagnosis not present

## 2024-01-19 DIAGNOSIS — F0284 Dementia in other diseases classified elsewhere, unspecified severity, with anxiety: Secondary | ICD-10-CM | POA: Diagnosis not present

## 2024-01-22 ENCOUNTER — Telehealth: Payer: Self-pay | Admitting: *Deleted

## 2024-01-22 NOTE — Telephone Encounter (Signed)
-----   Message from Darilyn Edin sent at 01/16/2024  4:14 PM EDT ----- I tried to call patient's wife but had to leave a voicemail. If you can confirm patient has been compliant with taking  anagrelide  1 mg twice daily. If he is compliant, then recommend increasing dose to 1.5 mg in the morning and 1 mg in the evening due to rise in platelet count.   Confirm which pharmacy I need to send the prescription.   Thanks, Delores Fester ----- Message ----- From: Dannis Dy, Lab In [place] Sent: 01/15/2024  12:09 PM EDT To: Ander Bame, MD

## 2024-01-22 NOTE — Telephone Encounter (Signed)
 TCT pt's son as I have not been able to get pt's wife on the phone.  Spoke with him.  Advised that his father's platelet count has increased to >1100. HGB is stable @ 12.9 Asked Dee Farber if he knew for certain that his father was taking the anagrelide  2 x a day. Advised that if he was taking it 2 x a day, we would need to increase it to 1.5 mg in the morning and 1 mg at night. He states he will find out and call back this afternoon.

## 2024-01-23 DIAGNOSIS — Z483 Aftercare following surgery for neoplasm: Secondary | ICD-10-CM | POA: Diagnosis not present

## 2024-01-23 DIAGNOSIS — I509 Heart failure, unspecified: Secondary | ICD-10-CM | POA: Diagnosis not present

## 2024-01-23 DIAGNOSIS — E039 Hypothyroidism, unspecified: Secondary | ICD-10-CM | POA: Diagnosis not present

## 2024-01-23 DIAGNOSIS — D473 Essential (hemorrhagic) thrombocythemia: Secondary | ICD-10-CM | POA: Diagnosis not present

## 2024-01-23 DIAGNOSIS — E785 Hyperlipidemia, unspecified: Secondary | ICD-10-CM | POA: Diagnosis not present

## 2024-01-23 DIAGNOSIS — M1990 Unspecified osteoarthritis, unspecified site: Secondary | ICD-10-CM | POA: Diagnosis not present

## 2024-01-23 DIAGNOSIS — F329 Major depressive disorder, single episode, unspecified: Secondary | ICD-10-CM | POA: Diagnosis not present

## 2024-01-23 DIAGNOSIS — Z9181 History of falling: Secondary | ICD-10-CM | POA: Diagnosis not present

## 2024-01-23 DIAGNOSIS — I130 Hypertensive heart and chronic kidney disease with heart failure and stage 1 through stage 4 chronic kidney disease, or unspecified chronic kidney disease: Secondary | ICD-10-CM | POA: Diagnosis not present

## 2024-01-23 DIAGNOSIS — C4490 Unspecified malignant neoplasm of skin, unspecified: Secondary | ICD-10-CM | POA: Diagnosis not present

## 2024-01-23 DIAGNOSIS — N1831 Chronic kidney disease, stage 3a: Secondary | ICD-10-CM | POA: Diagnosis not present

## 2024-01-23 DIAGNOSIS — F0283 Dementia in other diseases classified elsewhere, unspecified severity, with mood disturbance: Secondary | ICD-10-CM | POA: Diagnosis not present

## 2024-01-23 DIAGNOSIS — Z8673 Personal history of transient ischemic attack (TIA), and cerebral infarction without residual deficits: Secondary | ICD-10-CM | POA: Diagnosis not present

## 2024-01-23 DIAGNOSIS — K5909 Other constipation: Secondary | ICD-10-CM | POA: Diagnosis not present

## 2024-01-23 DIAGNOSIS — F0284 Dementia in other diseases classified elsewhere, unspecified severity, with anxiety: Secondary | ICD-10-CM | POA: Diagnosis not present

## 2024-01-23 DIAGNOSIS — I951 Orthostatic hypotension: Secondary | ICD-10-CM | POA: Diagnosis not present

## 2024-01-23 DIAGNOSIS — Z7901 Long term (current) use of anticoagulants: Secondary | ICD-10-CM | POA: Diagnosis not present

## 2024-01-23 DIAGNOSIS — K219 Gastro-esophageal reflux disease without esophagitis: Secondary | ICD-10-CM | POA: Diagnosis not present

## 2024-01-23 DIAGNOSIS — E1122 Type 2 diabetes mellitus with diabetic chronic kidney disease: Secondary | ICD-10-CM | POA: Diagnosis not present

## 2024-01-23 DIAGNOSIS — I480 Paroxysmal atrial fibrillation: Secondary | ICD-10-CM | POA: Diagnosis not present

## 2024-01-23 DIAGNOSIS — J189 Pneumonia, unspecified organism: Secondary | ICD-10-CM | POA: Diagnosis not present

## 2024-01-23 DIAGNOSIS — Z7984 Long term (current) use of oral hypoglycemic drugs: Secondary | ICD-10-CM | POA: Diagnosis not present

## 2024-01-23 DIAGNOSIS — Z794 Long term (current) use of insulin: Secondary | ICD-10-CM | POA: Diagnosis not present

## 2024-01-23 MED ORDER — ANAGRELIDE HCL 1 MG PO CAPS: ORAL_CAPSULE | ORAL | 0 refills | Status: AC

## 2024-01-23 MED ORDER — ANAGRELIDE HCL 0.5 MG PO CAPS: ORAL_CAPSULE | ORAL | 3 refills | Status: AC

## 2024-01-23 NOTE — Telephone Encounter (Signed)
 Pt's son, Dee Farber, [place] that he has advised his parents pt needs to be taking Anagrelide  1.5 in the am and 1 mg in the PM.  He has requested the new prescriptions be sent to [facility] in [facility]

## 2024-01-23 NOTE — Addendum Note (Signed)
 Addended by: Shabria Egley T on: 01/23/2024 04:20 PM   Modules accepted: Orders

## 2024-01-24 DIAGNOSIS — J189 Pneumonia, unspecified organism: Secondary | ICD-10-CM | POA: Diagnosis not present

## 2024-01-24 DIAGNOSIS — Z9181 History of falling: Secondary | ICD-10-CM | POA: Diagnosis not present

## 2024-01-24 DIAGNOSIS — M1990 Unspecified osteoarthritis, unspecified site: Secondary | ICD-10-CM | POA: Diagnosis not present

## 2024-01-24 DIAGNOSIS — K219 Gastro-esophageal reflux disease without esophagitis: Secondary | ICD-10-CM | POA: Diagnosis not present

## 2024-01-24 DIAGNOSIS — E039 Hypothyroidism, unspecified: Secondary | ICD-10-CM | POA: Diagnosis not present

## 2024-01-24 DIAGNOSIS — E1122 Type 2 diabetes mellitus with diabetic chronic kidney disease: Secondary | ICD-10-CM | POA: Diagnosis not present

## 2024-01-24 DIAGNOSIS — C4490 Unspecified malignant neoplasm of skin, unspecified: Secondary | ICD-10-CM | POA: Diagnosis not present

## 2024-01-24 DIAGNOSIS — I130 Hypertensive heart and chronic kidney disease with heart failure and stage 1 through stage 4 chronic kidney disease, or unspecified chronic kidney disease: Secondary | ICD-10-CM | POA: Diagnosis not present

## 2024-01-24 DIAGNOSIS — I509 Heart failure, unspecified: Secondary | ICD-10-CM | POA: Diagnosis not present

## 2024-01-24 DIAGNOSIS — Z8673 Personal history of transient ischemic attack (TIA), and cerebral infarction without residual deficits: Secondary | ICD-10-CM | POA: Diagnosis not present

## 2024-01-24 DIAGNOSIS — D473 Essential (hemorrhagic) thrombocythemia: Secondary | ICD-10-CM | POA: Diagnosis not present

## 2024-01-24 DIAGNOSIS — F329 Major depressive disorder, single episode, unspecified: Secondary | ICD-10-CM | POA: Diagnosis not present

## 2024-01-24 DIAGNOSIS — Z7984 Long term (current) use of oral hypoglycemic drugs: Secondary | ICD-10-CM | POA: Diagnosis not present

## 2024-01-24 DIAGNOSIS — K5909 Other constipation: Secondary | ICD-10-CM | POA: Diagnosis not present

## 2024-01-24 DIAGNOSIS — Z794 Long term (current) use of insulin: Secondary | ICD-10-CM | POA: Diagnosis not present

## 2024-01-24 DIAGNOSIS — N1831 Chronic kidney disease, stage 3a: Secondary | ICD-10-CM | POA: Diagnosis not present

## 2024-01-24 DIAGNOSIS — F0283 Dementia in other diseases classified elsewhere, unspecified severity, with mood disturbance: Secondary | ICD-10-CM | POA: Diagnosis not present

## 2024-01-24 DIAGNOSIS — Z483 Aftercare following surgery for neoplasm: Secondary | ICD-10-CM | POA: Diagnosis not present

## 2024-01-24 DIAGNOSIS — Z7901 Long term (current) use of anticoagulants: Secondary | ICD-10-CM | POA: Diagnosis not present

## 2024-01-24 DIAGNOSIS — F0284 Dementia in other diseases classified elsewhere, unspecified severity, with anxiety: Secondary | ICD-10-CM | POA: Diagnosis not present

## 2024-01-24 DIAGNOSIS — E785 Hyperlipidemia, unspecified: Secondary | ICD-10-CM | POA: Diagnosis not present

## 2024-01-24 DIAGNOSIS — I480 Paroxysmal atrial fibrillation: Secondary | ICD-10-CM | POA: Diagnosis not present

## 2024-01-24 DIAGNOSIS — I951 Orthostatic hypotension: Secondary | ICD-10-CM | POA: Diagnosis not present

## 2024-01-25 NOTE — Telephone Encounter (Signed)
 Spoke to wife and she will look for the application

## 2024-01-26 DIAGNOSIS — Z794 Long term (current) use of insulin: Secondary | ICD-10-CM | POA: Diagnosis not present

## 2024-01-26 DIAGNOSIS — Z9181 History of falling: Secondary | ICD-10-CM | POA: Diagnosis not present

## 2024-01-26 DIAGNOSIS — K5909 Other constipation: Secondary | ICD-10-CM | POA: Diagnosis not present

## 2024-01-26 DIAGNOSIS — E1122 Type 2 diabetes mellitus with diabetic chronic kidney disease: Secondary | ICD-10-CM | POA: Diagnosis not present

## 2024-01-26 DIAGNOSIS — I480 Paroxysmal atrial fibrillation: Secondary | ICD-10-CM | POA: Diagnosis not present

## 2024-01-26 DIAGNOSIS — C4490 Unspecified malignant neoplasm of skin, unspecified: Secondary | ICD-10-CM | POA: Diagnosis not present

## 2024-01-26 DIAGNOSIS — Z8673 Personal history of transient ischemic attack (TIA), and cerebral infarction without residual deficits: Secondary | ICD-10-CM | POA: Diagnosis not present

## 2024-01-26 DIAGNOSIS — E039 Hypothyroidism, unspecified: Secondary | ICD-10-CM | POA: Diagnosis not present

## 2024-01-26 DIAGNOSIS — F0283 Dementia in other diseases classified elsewhere, unspecified severity, with mood disturbance: Secondary | ICD-10-CM | POA: Diagnosis not present

## 2024-01-26 DIAGNOSIS — F0284 Dementia in other diseases classified elsewhere, unspecified severity, with anxiety: Secondary | ICD-10-CM | POA: Diagnosis not present

## 2024-01-26 DIAGNOSIS — M1990 Unspecified osteoarthritis, unspecified site: Secondary | ICD-10-CM | POA: Diagnosis not present

## 2024-01-26 DIAGNOSIS — D473 Essential (hemorrhagic) thrombocythemia: Secondary | ICD-10-CM | POA: Diagnosis not present

## 2024-01-26 DIAGNOSIS — K219 Gastro-esophageal reflux disease without esophagitis: Secondary | ICD-10-CM | POA: Diagnosis not present

## 2024-01-26 DIAGNOSIS — N1831 Chronic kidney disease, stage 3a: Secondary | ICD-10-CM | POA: Diagnosis not present

## 2024-01-26 DIAGNOSIS — Z7984 Long term (current) use of oral hypoglycemic drugs: Secondary | ICD-10-CM | POA: Diagnosis not present

## 2024-01-26 DIAGNOSIS — Z483 Aftercare following surgery for neoplasm: Secondary | ICD-10-CM | POA: Diagnosis not present

## 2024-01-26 DIAGNOSIS — E785 Hyperlipidemia, unspecified: Secondary | ICD-10-CM | POA: Diagnosis not present

## 2024-01-26 DIAGNOSIS — I951 Orthostatic hypotension: Secondary | ICD-10-CM | POA: Diagnosis not present

## 2024-01-26 DIAGNOSIS — I509 Heart failure, unspecified: Secondary | ICD-10-CM | POA: Diagnosis not present

## 2024-01-26 DIAGNOSIS — J189 Pneumonia, unspecified organism: Secondary | ICD-10-CM | POA: Diagnosis not present

## 2024-01-26 DIAGNOSIS — F329 Major depressive disorder, single episode, unspecified: Secondary | ICD-10-CM | POA: Diagnosis not present

## 2024-01-26 DIAGNOSIS — I130 Hypertensive heart and chronic kidney disease with heart failure and stage 1 through stage 4 chronic kidney disease, or unspecified chronic kidney disease: Secondary | ICD-10-CM | POA: Diagnosis not present

## 2024-01-26 DIAGNOSIS — Z7901 Long term (current) use of anticoagulants: Secondary | ICD-10-CM | POA: Diagnosis not present

## 2024-01-29 ENCOUNTER — Inpatient Hospital Stay

## 2024-01-29 DIAGNOSIS — D473 Essential (hemorrhagic) thrombocythemia: Secondary | ICD-10-CM | POA: Diagnosis not present

## 2024-01-29 DIAGNOSIS — D472 Monoclonal gammopathy: Secondary | ICD-10-CM | POA: Diagnosis not present

## 2024-01-29 LAB — CMP (CANCER CENTER ONLY)
ALT: 21 U/L (ref 0–44)
AST: 18 U/L (ref 15–41)
Albumin: 4.3 g/dL (ref 3.5–5.0)
Alkaline Phosphatase: 104 U/L (ref 38–126)
Anion gap: 11 (ref 5–15)
BUN: 53 mg/dL — ABNORMAL HIGH (ref 8–23)
CO2: 28 mmol/L (ref 22–32)
Calcium: 10.3 mg/dL (ref 8.9–10.3)
Chloride: 97 mmol/L — ABNORMAL LOW (ref 98–111)
Creatinine: 2.7 mg/dL — ABNORMAL HIGH (ref 0.61–1.24)
GFR, Estimated: 23 mL/min — ABNORMAL LOW (ref >60)
Glucose, Bld: 247 mg/dL — ABNORMAL HIGH (ref 70–99)
Potassium: 3.4 mmol/L — ABNORMAL LOW (ref 3.5–5.1)
Sodium: 136 mmol/L (ref 135–145)
Total Bilirubin: 0.6 mg/dL (ref 0.0–1.2)
Total Protein: 7.9 g/dL (ref 6.5–8.1)

## 2024-01-29 LAB — CBC WITH DIFFERENTIAL (CANCER CENTER ONLY)
Abs Immature Granulocytes: 0.09 10*3/uL — ABNORMAL HIGH (ref 0.00–0.07)
Basophils Absolute: 0.3 10*3/uL — ABNORMAL HIGH (ref 0.0–0.1)
Basophils Relative: 2 %
Eosinophils Absolute: 0.3 10*3/uL (ref 0.0–0.5)
Eosinophils Relative: 2 %
HCT: 40.4 % (ref 39.0–52.0)
Hemoglobin: 13.6 g/dL (ref 13.0–17.0)
Immature Granulocytes: 1 %
Lymphocytes Relative: 8 %
Lymphs Abs: 1.1 10*3/uL (ref 0.7–4.0)
MCH: 27.9 pg (ref 26.0–34.0)
MCHC: 33.7 g/dL (ref 30.0–36.0)
MCV: 82.8 fL (ref 80.0–100.0)
Monocytes Absolute: 0.9 10*3/uL (ref 0.1–1.0)
Monocytes Relative: 7 %
Neutro Abs: 11.5 10*3/uL — ABNORMAL HIGH (ref 1.7–7.7)
Neutrophils Relative %: 80 %
Platelet Count: 779 10*3/uL — ABNORMAL HIGH (ref 150–400)
RBC: 4.88 MIL/uL (ref 4.22–5.81)
RDW: 21.1 % — ABNORMAL HIGH (ref 11.5–15.5)
WBC Count: 14.2 10*3/uL — ABNORMAL HIGH (ref 4.0–10.5)
nRBC: 0.2 % (ref 0.0–0.2)

## 2024-01-30 DIAGNOSIS — K5909 Other constipation: Secondary | ICD-10-CM | POA: Diagnosis not present

## 2024-01-30 DIAGNOSIS — Z794 Long term (current) use of insulin: Secondary | ICD-10-CM | POA: Diagnosis not present

## 2024-01-30 DIAGNOSIS — Z7984 Long term (current) use of oral hypoglycemic drugs: Secondary | ICD-10-CM | POA: Diagnosis not present

## 2024-01-30 DIAGNOSIS — F0283 Dementia in other diseases classified elsewhere, unspecified severity, with mood disturbance: Secondary | ICD-10-CM | POA: Diagnosis not present

## 2024-01-30 DIAGNOSIS — J189 Pneumonia, unspecified organism: Secondary | ICD-10-CM | POA: Diagnosis not present

## 2024-01-30 DIAGNOSIS — D473 Essential (hemorrhagic) thrombocythemia: Secondary | ICD-10-CM | POA: Diagnosis not present

## 2024-01-30 DIAGNOSIS — E785 Hyperlipidemia, unspecified: Secondary | ICD-10-CM | POA: Diagnosis not present

## 2024-01-30 DIAGNOSIS — K219 Gastro-esophageal reflux disease without esophagitis: Secondary | ICD-10-CM | POA: Diagnosis not present

## 2024-01-30 DIAGNOSIS — E1122 Type 2 diabetes mellitus with diabetic chronic kidney disease: Secondary | ICD-10-CM | POA: Diagnosis not present

## 2024-01-30 DIAGNOSIS — I951 Orthostatic hypotension: Secondary | ICD-10-CM | POA: Diagnosis not present

## 2024-01-30 DIAGNOSIS — Z8673 Personal history of transient ischemic attack (TIA), and cerebral infarction without residual deficits: Secondary | ICD-10-CM | POA: Diagnosis not present

## 2024-01-30 DIAGNOSIS — Z9181 History of falling: Secondary | ICD-10-CM | POA: Diagnosis not present

## 2024-01-30 DIAGNOSIS — E039 Hypothyroidism, unspecified: Secondary | ICD-10-CM | POA: Diagnosis not present

## 2024-01-30 DIAGNOSIS — I480 Paroxysmal atrial fibrillation: Secondary | ICD-10-CM | POA: Diagnosis not present

## 2024-01-30 DIAGNOSIS — N1831 Chronic kidney disease, stage 3a: Secondary | ICD-10-CM | POA: Diagnosis not present

## 2024-01-30 DIAGNOSIS — F329 Major depressive disorder, single episode, unspecified: Secondary | ICD-10-CM | POA: Diagnosis not present

## 2024-01-30 DIAGNOSIS — Z483 Aftercare following surgery for neoplasm: Secondary | ICD-10-CM | POA: Diagnosis not present

## 2024-01-30 DIAGNOSIS — M1990 Unspecified osteoarthritis, unspecified site: Secondary | ICD-10-CM | POA: Diagnosis not present

## 2024-01-30 DIAGNOSIS — I130 Hypertensive heart and chronic kidney disease with heart failure and stage 1 through stage 4 chronic kidney disease, or unspecified chronic kidney disease: Secondary | ICD-10-CM | POA: Diagnosis not present

## 2024-01-30 DIAGNOSIS — C4490 Unspecified malignant neoplasm of skin, unspecified: Secondary | ICD-10-CM | POA: Diagnosis not present

## 2024-01-30 DIAGNOSIS — Z7901 Long term (current) use of anticoagulants: Secondary | ICD-10-CM | POA: Diagnosis not present

## 2024-01-30 DIAGNOSIS — F0284 Dementia in other diseases classified elsewhere, unspecified severity, with anxiety: Secondary | ICD-10-CM | POA: Diagnosis not present

## 2024-01-30 DIAGNOSIS — I509 Heart failure, unspecified: Secondary | ICD-10-CM | POA: Diagnosis not present

## 2024-01-30 NOTE — Telephone Encounter (Signed)
 Spoke to wife she said havent had time to look for application nor call medicare payment plan. She said they are looking at hospice. Closing encounter

## 2024-02-01 ENCOUNTER — Telehealth: Payer: Self-pay | Admitting: Adult Health

## 2024-02-01 ENCOUNTER — Telehealth: Payer: Self-pay | Admitting: *Deleted

## 2024-02-01 DIAGNOSIS — Z9181 History of falling: Secondary | ICD-10-CM | POA: Diagnosis not present

## 2024-02-01 DIAGNOSIS — I480 Paroxysmal atrial fibrillation: Secondary | ICD-10-CM | POA: Diagnosis not present

## 2024-02-01 DIAGNOSIS — K219 Gastro-esophageal reflux disease without esophagitis: Secondary | ICD-10-CM | POA: Diagnosis not present

## 2024-02-01 DIAGNOSIS — C4490 Unspecified malignant neoplasm of skin, unspecified: Secondary | ICD-10-CM | POA: Diagnosis not present

## 2024-02-01 DIAGNOSIS — F0283 Dementia in other diseases classified elsewhere, unspecified severity, with mood disturbance: Secondary | ICD-10-CM | POA: Diagnosis not present

## 2024-02-01 DIAGNOSIS — Z483 Aftercare following surgery for neoplasm: Secondary | ICD-10-CM | POA: Diagnosis not present

## 2024-02-01 DIAGNOSIS — F329 Major depressive disorder, single episode, unspecified: Secondary | ICD-10-CM | POA: Diagnosis not present

## 2024-02-01 DIAGNOSIS — N1831 Chronic kidney disease, stage 3a: Secondary | ICD-10-CM | POA: Diagnosis not present

## 2024-02-01 DIAGNOSIS — J189 Pneumonia, unspecified organism: Secondary | ICD-10-CM | POA: Diagnosis not present

## 2024-02-01 DIAGNOSIS — Z7901 Long term (current) use of anticoagulants: Secondary | ICD-10-CM | POA: Diagnosis not present

## 2024-02-01 DIAGNOSIS — I951 Orthostatic hypotension: Secondary | ICD-10-CM | POA: Diagnosis not present

## 2024-02-01 DIAGNOSIS — I130 Hypertensive heart and chronic kidney disease with heart failure and stage 1 through stage 4 chronic kidney disease, or unspecified chronic kidney disease: Secondary | ICD-10-CM | POA: Diagnosis not present

## 2024-02-01 DIAGNOSIS — Z7984 Long term (current) use of oral hypoglycemic drugs: Secondary | ICD-10-CM | POA: Diagnosis not present

## 2024-02-01 DIAGNOSIS — F0284 Dementia in other diseases classified elsewhere, unspecified severity, with anxiety: Secondary | ICD-10-CM | POA: Diagnosis not present

## 2024-02-01 DIAGNOSIS — Z8673 Personal history of transient ischemic attack (TIA), and cerebral infarction without residual deficits: Secondary | ICD-10-CM | POA: Diagnosis not present

## 2024-02-01 DIAGNOSIS — I509 Heart failure, unspecified: Secondary | ICD-10-CM | POA: Diagnosis not present

## 2024-02-01 DIAGNOSIS — E1122 Type 2 diabetes mellitus with diabetic chronic kidney disease: Secondary | ICD-10-CM | POA: Diagnosis not present

## 2024-02-01 DIAGNOSIS — D473 Essential (hemorrhagic) thrombocythemia: Secondary | ICD-10-CM | POA: Diagnosis not present

## 2024-02-01 DIAGNOSIS — K5909 Other constipation: Secondary | ICD-10-CM | POA: Diagnosis not present

## 2024-02-01 DIAGNOSIS — E785 Hyperlipidemia, unspecified: Secondary | ICD-10-CM | POA: Diagnosis not present

## 2024-02-01 DIAGNOSIS — Z794 Long term (current) use of insulin: Secondary | ICD-10-CM | POA: Diagnosis not present

## 2024-02-01 DIAGNOSIS — E039 Hypothyroidism, unspecified: Secondary | ICD-10-CM | POA: Diagnosis not present

## 2024-02-01 DIAGNOSIS — M1990 Unspecified osteoarthritis, unspecified site: Secondary | ICD-10-CM | POA: Diagnosis not present

## 2024-02-01 NOTE — Telephone Encounter (Signed)
 Spoke w/Pt and wife regarding the reported new onset tremors and dizziness. Discussed Pt had OV w/[facility] NP in March, 2025 who recommended increasing tompiramate from 50 mg to 100 mg at bedtime. Pt wife confirmed Pt had been taking that increased dosage since the March appt. Noted in Pt chart, Pt recently had a medication increase from [facility] (noted 01/22/24). He was to increase his anagrelide  to 1.5 mg in AM and take 1 mg in PM. Discussed with Pt and his wife the anagrelide  can cause dizziness and because this is a recent medication change encouraged Pt and wife to also make the Cancer Center aware he is having the issues. Due to the anagrelide  medication recently being increased and issues recently occurring it is likely from that since Pt had not reported any issues after having increased the topiramate  in March. Pt and wife voiced understanding.

## 2024-02-01 NOTE — Telephone Encounter (Signed)
 Received call from pt's wife, Rexene Catching. She states that Limmie has developed some tremors since increasing hisd Anagrelide  as well as some dizzyness. Advised I would discuss with Dr. Rosaline Coma and call her back.  Discussed with Dr. Rosaline Coma. He does say that sometimes this medication can cause some of these symptoms. He advised to continue his current dosage and see how he does in the days ahead. He has labs and a clinic visit with Dr. Rosaline Coma on 02/12/24 TCT pt's wife. No answer but was able to leave vm message for her to return this call to [phone]

## 2024-02-01 NOTE — Telephone Encounter (Signed)
 Wife reports within the last 2-3 days pt is having tremors and dizziness.  Wife states they were told to call about medication adjustments if pt were to have issues [id number] and dizziness please call wife to discuss.

## 2024-02-02 DIAGNOSIS — I130 Hypertensive heart and chronic kidney disease with heart failure and stage 1 through stage 4 chronic kidney disease, or unspecified chronic kidney disease: Secondary | ICD-10-CM | POA: Diagnosis not present

## 2024-02-02 DIAGNOSIS — E1122 Type 2 diabetes mellitus with diabetic chronic kidney disease: Secondary | ICD-10-CM | POA: Diagnosis not present

## 2024-02-02 DIAGNOSIS — D473 Essential (hemorrhagic) thrombocythemia: Secondary | ICD-10-CM | POA: Diagnosis not present

## 2024-02-02 DIAGNOSIS — E1142 Type 2 diabetes mellitus with diabetic polyneuropathy: Secondary | ICD-10-CM | POA: Diagnosis not present

## 2024-02-02 DIAGNOSIS — N1831 Chronic kidney disease, stage 3a: Secondary | ICD-10-CM | POA: Diagnosis not present

## 2024-02-02 DIAGNOSIS — R001 Bradycardia, unspecified: Secondary | ICD-10-CM | POA: Diagnosis not present

## 2024-02-02 DIAGNOSIS — I480 Paroxysmal atrial fibrillation: Secondary | ICD-10-CM | POA: Diagnosis not present

## 2024-02-02 DIAGNOSIS — I5030 Unspecified diastolic (congestive) heart failure: Secondary | ICD-10-CM | POA: Diagnosis not present

## 2024-02-02 DIAGNOSIS — F0390 Unspecified dementia without behavioral disturbance: Secondary | ICD-10-CM | POA: Diagnosis not present

## 2024-02-02 DIAGNOSIS — N281 Cyst of kidney, acquired: Secondary | ICD-10-CM | POA: Diagnosis not present

## 2024-02-02 DIAGNOSIS — N400 Enlarged prostate without lower urinary tract symptoms: Secondary | ICD-10-CM | POA: Diagnosis not present

## 2024-02-02 DIAGNOSIS — E113513 Type 2 diabetes mellitus with proliferative diabetic retinopathy with macular edema, bilateral: Secondary | ICD-10-CM | POA: Diagnosis not present

## 2024-02-02 DIAGNOSIS — E861 Hypovolemia: Secondary | ICD-10-CM | POA: Diagnosis not present

## 2024-02-02 DIAGNOSIS — Z7901 Long term (current) use of anticoagulants: Secondary | ICD-10-CM | POA: Diagnosis not present

## 2024-02-02 DIAGNOSIS — N401 Enlarged prostate with lower urinary tract symptoms: Secondary | ICD-10-CM | POA: Diagnosis not present

## 2024-02-02 DIAGNOSIS — N179 Acute kidney failure, unspecified: Secondary | ICD-10-CM | POA: Diagnosis not present

## 2024-02-02 DIAGNOSIS — D72829 Elevated white blood cell count, unspecified: Secondary | ICD-10-CM | POA: Diagnosis not present

## 2024-02-02 DIAGNOSIS — I4891 Unspecified atrial fibrillation: Secondary | ICD-10-CM | POA: Diagnosis not present

## 2024-02-02 DIAGNOSIS — Z743 Need for continuous supervision: Secondary | ICD-10-CM | POA: Diagnosis not present

## 2024-02-02 DIAGNOSIS — R5381 Other malaise: Secondary | ICD-10-CM | POA: Diagnosis not present

## 2024-02-02 DIAGNOSIS — D75839 Thrombocytosis, unspecified: Secondary | ICD-10-CM | POA: Diagnosis not present

## 2024-02-02 DIAGNOSIS — Z043 Encounter for examination and observation following other accident: Secondary | ICD-10-CM | POA: Diagnosis not present

## 2024-02-02 DIAGNOSIS — D649 Anemia, unspecified: Secondary | ICD-10-CM | POA: Diagnosis not present

## 2024-02-02 DIAGNOSIS — R739 Hyperglycemia, unspecified: Secondary | ICD-10-CM | POA: Diagnosis not present

## 2024-02-02 DIAGNOSIS — E876 Hypokalemia: Secondary | ICD-10-CM | POA: Diagnosis not present

## 2024-02-02 DIAGNOSIS — G928 Other toxic encephalopathy: Secondary | ICD-10-CM | POA: Diagnosis not present

## 2024-02-02 DIAGNOSIS — I679 Cerebrovascular disease, unspecified: Secondary | ICD-10-CM | POA: Diagnosis not present

## 2024-02-02 DIAGNOSIS — R338 Other retention of urine: Secondary | ICD-10-CM | POA: Diagnosis not present

## 2024-02-02 DIAGNOSIS — E039 Hypothyroidism, unspecified: Secondary | ICD-10-CM | POA: Diagnosis not present

## 2024-02-02 DIAGNOSIS — R0989 Other specified symptoms and signs involving the circulatory and respiratory systems: Secondary | ICD-10-CM | POA: Diagnosis not present

## 2024-02-02 DIAGNOSIS — R531 Weakness: Secondary | ICD-10-CM | POA: Diagnosis not present

## 2024-02-02 DIAGNOSIS — I5032 Chronic diastolic (congestive) heart failure: Secondary | ICD-10-CM | POA: Diagnosis not present

## 2024-02-02 DIAGNOSIS — R269 Unspecified abnormalities of gait and mobility: Secondary | ICD-10-CM | POA: Diagnosis not present

## 2024-02-02 DIAGNOSIS — I6782 Cerebral ischemia: Secondary | ICD-10-CM | POA: Diagnosis not present

## 2024-02-02 DIAGNOSIS — N189 Chronic kidney disease, unspecified: Secondary | ICD-10-CM | POA: Diagnosis not present

## 2024-02-02 DIAGNOSIS — J984 Other disorders of lung: Secondary | ICD-10-CM | POA: Diagnosis not present

## 2024-02-02 DIAGNOSIS — R4182 Altered mental status, unspecified: Secondary | ICD-10-CM | POA: Diagnosis not present

## 2024-02-02 DIAGNOSIS — R002 Palpitations: Secondary | ICD-10-CM | POA: Diagnosis not present

## 2024-02-02 DIAGNOSIS — R918 Other nonspecific abnormal finding of lung field: Secondary | ICD-10-CM | POA: Diagnosis not present

## 2024-02-12 ENCOUNTER — Telehealth: Payer: Self-pay | Admitting: Hematology and Oncology

## 2024-02-12 ENCOUNTER — Inpatient Hospital Stay

## 2024-02-12 ENCOUNTER — Inpatient Hospital Stay (HOSPITAL_BASED_OUTPATIENT_CLINIC_OR_DEPARTMENT_OTHER): Admitting: Hematology and Oncology

## 2024-02-12 DIAGNOSIS — D473 Essential (hemorrhagic) thrombocythemia: Secondary | ICD-10-CM

## 2024-02-12 NOTE — Progress Notes (Signed)
 Patient transitioned to hospice care for unrelated condition. Appointments cancelled.

## 2024-03-20 ENCOUNTER — Encounter: Payer: Self-pay | Admitting: Endocrinology

## 2024-06-05 ENCOUNTER — Ambulatory Visit: Admitting: Adult Health
